# Patient Record
Sex: Male | Born: 1970 | Race: White | Hispanic: No | Marital: Single | Smoking: Current every day smoker
Health system: Southern US, Community
[De-identification: ages and names within clinical notes are randomized; demographics above are authoritative.]

## PROBLEM LIST (undated history)

## (undated) DIAGNOSIS — F17200 Nicotine dependence, unspecified, uncomplicated: Secondary | ICD-10-CM

## (undated) DIAGNOSIS — J449 Chronic obstructive pulmonary disease, unspecified: Secondary | ICD-10-CM

## (undated) DIAGNOSIS — L409 Psoriasis, unspecified: Secondary | ICD-10-CM

---

## 2019-07-01 ENCOUNTER — Inpatient Hospital Stay (HOSPITAL_COMMUNITY): Payer: Medicaid Other

## 2019-07-01 ENCOUNTER — Telehealth (HOSPITAL_COMMUNITY): Payer: Self-pay | Admitting: *Deleted

## 2019-07-01 ENCOUNTER — Inpatient Hospital Stay (HOSPITAL_COMMUNITY): Payer: Medicaid Other | Admitting: Anesthesiology

## 2019-07-01 ENCOUNTER — Emergency Department (HOSPITAL_COMMUNITY): Payer: Medicaid Other

## 2019-07-01 ENCOUNTER — Other Ambulatory Visit (HOSPITAL_COMMUNITY): Payer: Self-pay

## 2019-07-01 ENCOUNTER — Encounter (HOSPITAL_COMMUNITY): Payer: Self-pay | Admitting: Certified Registered Nurse Anesthetist

## 2019-07-01 ENCOUNTER — Emergency Department (HOSPITAL_COMMUNITY): Payer: Medicaid Other | Admitting: Certified Registered Nurse Anesthetist

## 2019-07-01 ENCOUNTER — Encounter (HOSPITAL_COMMUNITY)
Admission: EM | Disposition: A | Discharge status: Hospice | Payer: Self-pay | Source: Home / Self Care | Attending: Neurology

## 2019-07-01 ENCOUNTER — Inpatient Hospital Stay (HOSPITAL_COMMUNITY)
Admission: EM | Disposition: A | Discharge status: Hospice | Payer: Self-pay | Source: Home / Self Care | Attending: Neurology

## 2019-07-01 ENCOUNTER — Inpatient Hospital Stay (HOSPITAL_COMMUNITY)
Admission: EM | Admit: 2019-07-01 | Discharge: 2019-08-31 | DRG: 003 | Disposition: A | Discharge status: Hospice | Payer: Medicaid Other | Attending: Neurology | Admitting: Neurology

## 2019-07-01 DIAGNOSIS — J0410 Acute tracheitis without obstruction: Secondary | ICD-10-CM | POA: Diagnosis not present

## 2019-07-01 DIAGNOSIS — J9602 Acute respiratory failure with hypercapnia: Secondary | ICD-10-CM | POA: Diagnosis not present

## 2019-07-01 DIAGNOSIS — Z20822 Contact with and (suspected) exposure to covid-19: Secondary | ICD-10-CM | POA: Diagnosis present

## 2019-07-01 DIAGNOSIS — J9600 Acute respiratory failure, unspecified whether with hypoxia or hypercapnia: Secondary | ICD-10-CM | POA: Diagnosis present

## 2019-07-01 DIAGNOSIS — N179 Acute kidney failure, unspecified: Secondary | ICD-10-CM | POA: Diagnosis not present

## 2019-07-01 DIAGNOSIS — R140 Abdominal distension (gaseous): Secondary | ICD-10-CM

## 2019-07-01 DIAGNOSIS — I82403 Acute embolism and thrombosis of unspecified deep veins of lower extremity, bilateral: Secondary | ICD-10-CM | POA: Diagnosis not present

## 2019-07-01 DIAGNOSIS — Z1383 Encounter for screening for respiratory disorder NEC: Secondary | ICD-10-CM

## 2019-07-01 DIAGNOSIS — R739 Hyperglycemia, unspecified: Secondary | ICD-10-CM | POA: Diagnosis not present

## 2019-07-01 DIAGNOSIS — Y92239 Unspecified place in hospital as the place of occurrence of the external cause: Secondary | ICD-10-CM | POA: Diagnosis not present

## 2019-07-01 DIAGNOSIS — I609 Nontraumatic subarachnoid hemorrhage, unspecified: Secondary | ICD-10-CM | POA: Diagnosis not present

## 2019-07-01 DIAGNOSIS — I63232 Cerebral infarction due to unspecified occlusion or stenosis of left carotid arteries: Secondary | ICD-10-CM | POA: Diagnosis present

## 2019-07-01 DIAGNOSIS — Z6834 Body mass index (BMI) 34.0-34.9, adult: Secondary | ICD-10-CM

## 2019-07-01 DIAGNOSIS — Z66 Do not resuscitate: Secondary | ICD-10-CM | POA: Diagnosis not present

## 2019-07-01 DIAGNOSIS — I82413 Acute embolism and thrombosis of femoral vein, bilateral: Secondary | ICD-10-CM | POA: Diagnosis not present

## 2019-07-01 DIAGNOSIS — E669 Obesity, unspecified: Secondary | ICD-10-CM | POA: Diagnosis present

## 2019-07-01 DIAGNOSIS — L8990 Pressure ulcer of unspecified site, unspecified stage: Secondary | ICD-10-CM | POA: Insufficient documentation

## 2019-07-01 DIAGNOSIS — I82451 Acute embolism and thrombosis of right peroneal vein: Secondary | ICD-10-CM | POA: Diagnosis not present

## 2019-07-01 DIAGNOSIS — F329 Major depressive disorder, single episode, unspecified: Secondary | ICD-10-CM | POA: Diagnosis not present

## 2019-07-01 DIAGNOSIS — J189 Pneumonia, unspecified organism: Secondary | ICD-10-CM

## 2019-07-01 DIAGNOSIS — Z930 Tracheostomy status: Secondary | ICD-10-CM

## 2019-07-01 DIAGNOSIS — Z452 Encounter for adjustment and management of vascular access device: Secondary | ICD-10-CM | POA: Diagnosis not present

## 2019-07-01 DIAGNOSIS — T4275XA Adverse effect of unspecified antiepileptic and sedative-hypnotic drugs, initial encounter: Secondary | ICD-10-CM | POA: Diagnosis not present

## 2019-07-01 DIAGNOSIS — R000 Tachycardia, unspecified: Secondary | ICD-10-CM | POA: Diagnosis not present

## 2019-07-01 DIAGNOSIS — D649 Anemia, unspecified: Secondary | ICD-10-CM | POA: Diagnosis not present

## 2019-07-01 DIAGNOSIS — I63132 Cerebral infarction due to embolism of left carotid artery: Secondary | ICD-10-CM | POA: Diagnosis present

## 2019-07-01 DIAGNOSIS — R001 Bradycardia, unspecified: Secondary | ICD-10-CM | POA: Diagnosis not present

## 2019-07-01 DIAGNOSIS — I471 Supraventricular tachycardia: Secondary | ICD-10-CM | POA: Diagnosis not present

## 2019-07-01 DIAGNOSIS — G9600 Cerebrospinal fluid leak, unspecified: Secondary | ICD-10-CM | POA: Diagnosis not present

## 2019-07-01 DIAGNOSIS — Z931 Gastrostomy status: Secondary | ICD-10-CM

## 2019-07-01 DIAGNOSIS — J449 Chronic obstructive pulmonary disease, unspecified: Secondary | ICD-10-CM

## 2019-07-01 DIAGNOSIS — D72829 Elevated white blood cell count, unspecified: Secondary | ICD-10-CM | POA: Diagnosis not present

## 2019-07-01 DIAGNOSIS — E46 Unspecified protein-calorie malnutrition: Secondary | ICD-10-CM | POA: Diagnosis not present

## 2019-07-01 DIAGNOSIS — J155 Pneumonia due to Escherichia coli: Secondary | ICD-10-CM | POA: Diagnosis not present

## 2019-07-01 DIAGNOSIS — Z430 Encounter for attention to tracheostomy: Secondary | ICD-10-CM | POA: Diagnosis not present

## 2019-07-01 DIAGNOSIS — J690 Pneumonitis due to inhalation of food and vomit: Secondary | ICD-10-CM | POA: Diagnosis not present

## 2019-07-01 DIAGNOSIS — Z4659 Encounter for fitting and adjustment of other gastrointestinal appliance and device: Secondary | ICD-10-CM

## 2019-07-01 DIAGNOSIS — I63512 Cerebral infarction due to unspecified occlusion or stenosis of left middle cerebral artery: Secondary | ICD-10-CM | POA: Diagnosis present

## 2019-07-01 DIAGNOSIS — Z23 Encounter for immunization: Secondary | ICD-10-CM

## 2019-07-01 DIAGNOSIS — E872 Acidosis: Secondary | ICD-10-CM | POA: Diagnosis not present

## 2019-07-01 DIAGNOSIS — L409 Psoriasis, unspecified: Secondary | ICD-10-CM | POA: Diagnosis not present

## 2019-07-01 DIAGNOSIS — Z8673 Personal history of transient ischemic attack (TIA), and cerebral infarction without residual deficits: Secondary | ICD-10-CM

## 2019-07-01 DIAGNOSIS — Z791 Long term (current) use of non-steroidal anti-inflammatories (NSAID): Secondary | ICD-10-CM

## 2019-07-01 DIAGNOSIS — I69391 Dysphagia following cerebral infarction: Secondary | ICD-10-CM | POA: Diagnosis not present

## 2019-07-01 DIAGNOSIS — R509 Fever, unspecified: Secondary | ICD-10-CM | POA: Diagnosis not present

## 2019-07-01 DIAGNOSIS — Z978 Presence of other specified devices: Secondary | ICD-10-CM

## 2019-07-01 DIAGNOSIS — I6602 Occlusion and stenosis of left middle cerebral artery: Secondary | ICD-10-CM | POA: Diagnosis not present

## 2019-07-01 DIAGNOSIS — I6389 Other cerebral infarction: Secondary | ICD-10-CM | POA: Diagnosis not present

## 2019-07-01 DIAGNOSIS — R4020 Unspecified coma: Secondary | ICD-10-CM | POA: Diagnosis not present

## 2019-07-01 DIAGNOSIS — G936 Cerebral edema: Secondary | ICD-10-CM | POA: Diagnosis not present

## 2019-07-01 DIAGNOSIS — I10 Essential (primary) hypertension: Secondary | ICD-10-CM | POA: Diagnosis present

## 2019-07-01 DIAGNOSIS — Z781 Physical restraint status: Secondary | ICD-10-CM

## 2019-07-01 DIAGNOSIS — A419 Sepsis, unspecified organism: Secondary | ICD-10-CM | POA: Diagnosis not present

## 2019-07-01 DIAGNOSIS — L89893 Pressure ulcer of other site, stage 3: Secondary | ICD-10-CM | POA: Diagnosis not present

## 2019-07-01 DIAGNOSIS — Z431 Encounter for attention to gastrostomy: Secondary | ICD-10-CM | POA: Diagnosis not present

## 2019-07-01 DIAGNOSIS — E876 Hypokalemia: Secondary | ICD-10-CM | POA: Diagnosis not present

## 2019-07-01 DIAGNOSIS — K567 Ileus, unspecified: Secondary | ICD-10-CM | POA: Diagnosis not present

## 2019-07-01 DIAGNOSIS — G92 Toxic encephalopathy: Secondary | ICD-10-CM | POA: Diagnosis not present

## 2019-07-01 DIAGNOSIS — J441 Chronic obstructive pulmonary disease with (acute) exacerbation: Secondary | ICD-10-CM | POA: Diagnosis not present

## 2019-07-01 DIAGNOSIS — G8191 Hemiplegia, unspecified affecting right dominant side: Secondary | ICD-10-CM | POA: Diagnosis present

## 2019-07-01 DIAGNOSIS — J440 Chronic obstructive pulmonary disease with acute lower respiratory infection: Secondary | ICD-10-CM | POA: Diagnosis not present

## 2019-07-01 DIAGNOSIS — S0001XA Abrasion of scalp, initial encounter: Secondary | ICD-10-CM | POA: Diagnosis not present

## 2019-07-01 DIAGNOSIS — I82431 Acute embolism and thrombosis of right popliteal vein: Secondary | ICD-10-CM | POA: Diagnosis not present

## 2019-07-01 DIAGNOSIS — Z1389 Encounter for screening for other disorder: Secondary | ICD-10-CM

## 2019-07-01 DIAGNOSIS — T8131XA Disruption of external operation (surgical) wound, not elsewhere classified, initial encounter: Secondary | ICD-10-CM | POA: Diagnosis not present

## 2019-07-01 DIAGNOSIS — F17200 Nicotine dependence, unspecified, uncomplicated: Secondary | ICD-10-CM | POA: Diagnosis not present

## 2019-07-01 DIAGNOSIS — F17210 Nicotine dependence, cigarettes, uncomplicated: Secondary | ICD-10-CM | POA: Diagnosis present

## 2019-07-01 DIAGNOSIS — R29810 Facial weakness: Secondary | ICD-10-CM | POA: Diagnosis present

## 2019-07-01 DIAGNOSIS — Z9889 Other specified postprocedural states: Secondary | ICD-10-CM | POA: Diagnosis not present

## 2019-07-01 DIAGNOSIS — K760 Fatty (change of) liver, not elsewhere classified: Secondary | ICD-10-CM | POA: Diagnosis present

## 2019-07-01 DIAGNOSIS — D696 Thrombocytopenia, unspecified: Secondary | ICD-10-CM | POA: Diagnosis not present

## 2019-07-01 DIAGNOSIS — R29721 NIHSS score 21: Secondary | ICD-10-CM | POA: Diagnosis present

## 2019-07-01 DIAGNOSIS — R4701 Aphasia: Secondary | ICD-10-CM | POA: Diagnosis present

## 2019-07-01 DIAGNOSIS — J90 Pleural effusion, not elsewhere classified: Secondary | ICD-10-CM | POA: Diagnosis not present

## 2019-07-01 DIAGNOSIS — A498 Other bacterial infections of unspecified site: Secondary | ICD-10-CM | POA: Diagnosis not present

## 2019-07-01 DIAGNOSIS — Z79899 Other long term (current) drug therapy: Secondary | ICD-10-CM

## 2019-07-01 DIAGNOSIS — J9690 Respiratory failure, unspecified, unspecified whether with hypoxia or hypercapnia: Secondary | ICD-10-CM

## 2019-07-01 DIAGNOSIS — E871 Hypo-osmolality and hyponatremia: Secondary | ICD-10-CM | POA: Diagnosis not present

## 2019-07-01 DIAGNOSIS — Z7189 Other specified counseling: Secondary | ICD-10-CM

## 2019-07-01 DIAGNOSIS — A413 Sepsis due to Hemophilus influenzae: Secondary | ICD-10-CM | POA: Diagnosis not present

## 2019-07-01 DIAGNOSIS — R1312 Dysphagia, oropharyngeal phase: Secondary | ICD-10-CM | POA: Diagnosis not present

## 2019-07-01 DIAGNOSIS — J9601 Acute respiratory failure with hypoxia: Secondary | ICD-10-CM | POA: Diagnosis not present

## 2019-07-01 DIAGNOSIS — Y95 Nosocomial condition: Secondary | ICD-10-CM | POA: Diagnosis not present

## 2019-07-01 DIAGNOSIS — I639 Cerebral infarction, unspecified: Secondary | ICD-10-CM | POA: Diagnosis present

## 2019-07-01 DIAGNOSIS — O2230 Deep phlebothrombosis in pregnancy, unspecified trimester: Secondary | ICD-10-CM

## 2019-07-01 DIAGNOSIS — Z9289 Personal history of other medical treatment: Secondary | ICD-10-CM

## 2019-07-01 DIAGNOSIS — E870 Hyperosmolality and hypernatremia: Secondary | ICD-10-CM | POA: Diagnosis not present

## 2019-07-01 DIAGNOSIS — Z515 Encounter for palliative care: Secondary | ICD-10-CM | POA: Diagnosis not present

## 2019-07-01 DIAGNOSIS — R5081 Fever presenting with conditions classified elsewhere: Secondary | ICD-10-CM | POA: Diagnosis not present

## 2019-07-01 DIAGNOSIS — I82441 Acute embolism and thrombosis of right tibial vein: Secondary | ICD-10-CM | POA: Diagnosis not present

## 2019-07-01 DIAGNOSIS — R1310 Dysphagia, unspecified: Secondary | ICD-10-CM | POA: Diagnosis present

## 2019-07-01 DIAGNOSIS — B963 Hemophilus influenzae [H. influenzae] as the cause of diseases classified elsewhere: Secondary | ICD-10-CM | POA: Diagnosis present

## 2019-07-01 DIAGNOSIS — E785 Hyperlipidemia, unspecified: Secondary | ICD-10-CM | POA: Diagnosis present

## 2019-07-01 DIAGNOSIS — R0902 Hypoxemia: Secondary | ICD-10-CM

## 2019-07-01 DIAGNOSIS — L89151 Pressure ulcer of sacral region, stage 1: Secondary | ICD-10-CM | POA: Diagnosis not present

## 2019-07-01 DIAGNOSIS — R7401 Elevation of levels of liver transaminase levels: Secondary | ICD-10-CM | POA: Diagnosis not present

## 2019-07-01 DIAGNOSIS — R6520 Severe sepsis without septic shock: Secondary | ICD-10-CM | POA: Diagnosis not present

## 2019-07-01 DIAGNOSIS — B9561 Methicillin susceptible Staphylococcus aureus infection as the cause of diseases classified elsewhere: Secondary | ICD-10-CM | POA: Diagnosis present

## 2019-07-01 HISTORY — PX: IR CT HEAD LTD: IMG2386

## 2019-07-01 HISTORY — DX: Personal history of transient ischemic attack (TIA), and cerebral infarction without residual deficits: Z86.73

## 2019-07-01 HISTORY — PX: RADIOLOGY WITH ANESTHESIA: SHX6223

## 2019-07-01 HISTORY — PX: CRANIOTOMY: SHX93

## 2019-07-01 HISTORY — DX: Psoriasis, unspecified: L40.9

## 2019-07-01 HISTORY — DX: Chronic obstructive pulmonary disease, unspecified: J44.9

## 2019-07-01 HISTORY — DX: Nicotine dependence, unspecified, uncomplicated: F17.200

## 2019-07-01 HISTORY — PX: IR PERCUTANEOUS ART THROMBECTOMY/INFUSION INTRACRANIAL INC DIAG ANGIO: IMG6087

## 2019-07-01 LAB — POCT I-STAT 7, (LYTES, BLD GAS, ICA,H+H)
Acid-base deficit: 3 mmol/L — ABNORMAL HIGH (ref 0.0–2.0)
Acid-base deficit: 7 mmol/L — ABNORMAL HIGH (ref 0.0–2.0)
Acid-base deficit: 8 mmol/L — ABNORMAL HIGH (ref 0.0–2.0)
Acid-base deficit: 7 mmol/L — ABNORMAL HIGH (ref 0.0–2.0)
Acid-base deficit: 3 mmol/L — ABNORMAL HIGH (ref 0.0–2.0)
Bicarbonate: 25.3 mmol/L (ref 20.0–28.0)
Bicarbonate: 20.6 mmol/L (ref 20.0–28.0)
Bicarbonate: 19.8 mmol/L — ABNORMAL LOW (ref 20.0–28.0)
Bicarbonate: 20.2 mmol/L (ref 20.0–28.0)
Bicarbonate: 23.9 mmol/L (ref 20.0–28.0)
Calcium, Ion: 1.24 mmol/L (ref 1.15–1.40)
Calcium, Ion: 1.37 mmol/L (ref 1.15–1.40)
Calcium, Ion: 0.7 mmol/L — CL (ref 1.15–1.40)
Calcium, Ion: 0.7 mmol/L — CL (ref 1.15–1.40)
Calcium, Ion: 1.26 mmol/L (ref 1.15–1.40)
HCT: 43 % (ref 39.0–52.0)
HCT: 30 % — ABNORMAL LOW (ref 39.0–52.0)
HCT: 31 % — ABNORMAL LOW (ref 39.0–52.0)
HCT: 29 % — ABNORMAL LOW (ref 39.0–52.0)
HCT: 41 % (ref 39.0–52.0)
Hemoglobin: 14.6 g/dL (ref 13.0–17.0)
Hemoglobin: 10.2 g/dL — ABNORMAL LOW (ref 13.0–17.0)
Hemoglobin: 10.5 g/dL — ABNORMAL LOW (ref 13.0–17.0)
Hemoglobin: 9.9 g/dL — ABNORMAL LOW (ref 13.0–17.0)
Hemoglobin: 13.9 g/dL (ref 13.0–17.0)
O2 Saturation: 98 %
O2 Saturation: 91 %
O2 Saturation: 98 %
O2 Saturation: 99 %
O2 Saturation: 100 %
Patient temperature: 35.6
Patient temperature: 36
Patient temperature: 35.6
Patient temperature: 36
Potassium: 4.9 mmol/L (ref 3.5–5.1)
Potassium: 3.5 mmol/L (ref 3.5–5.1)
Potassium: 5 mmol/L (ref 3.5–5.1)
Potassium: 6.7 mmol/L (ref 3.5–5.1)
Potassium: 3.6 mmol/L (ref 3.5–5.1)
Sodium: 140 mmol/L (ref 135–145)
Sodium: 142 mmol/L (ref 135–145)
Sodium: 142 mmol/L (ref 135–145)
Sodium: 140 mmol/L (ref 135–145)
Sodium: 143 mmol/L (ref 135–145)
TCO2: 27 mmol/L (ref 22–32)
TCO2: 22 mmol/L (ref 22–32)
TCO2: 21 mmol/L — ABNORMAL LOW (ref 22–32)
TCO2: 22 mmol/L (ref 22–32)
TCO2: 25 mmol/L (ref 22–32)
pCO2 arterial: 53 mmHg — ABNORMAL HIGH (ref 32.0–48.0)
pCO2 arterial: 50.4 mmHg — ABNORMAL HIGH (ref 32.0–48.0)
pCO2 arterial: 49.5 mmHg — ABNORMAL HIGH (ref 32.0–48.0)
pCO2 arterial: 43 mmHg (ref 32.0–48.0)
pCO2 arterial: 47.6 mmHg (ref 32.0–48.0)
pH, Arterial: 7.279 — ABNORMAL LOW (ref 7.350–7.450)
pH, Arterial: 7.214 — ABNORMAL LOW (ref 7.350–7.450)
pH, Arterial: 7.203 — ABNORMAL LOW (ref 7.350–7.450)
pH, Arterial: 7.275 — ABNORMAL LOW (ref 7.350–7.450)
pH, Arterial: 7.309 — ABNORMAL LOW (ref 7.350–7.450)
pO2, Arterial: 113 mmHg — ABNORMAL HIGH (ref 83.0–108.0)
pO2, Arterial: 70 mmHg — ABNORMAL LOW (ref 83.0–108.0)
pO2, Arterial: 131 mmHg — ABNORMAL HIGH (ref 83.0–108.0)
pO2, Arterial: 183 mmHg — ABNORMAL HIGH (ref 83.0–108.0)
pO2, Arterial: 209 mmHg — ABNORMAL HIGH (ref 83.0–108.0)

## 2019-07-01 LAB — BLOOD GAS, ARTERIAL
Acid-Base Excess: 0.5 mmol/L (ref 0.0–2.0)
Bicarbonate: 26.3 mmol/L (ref 20.0–28.0)
Drawn by: 137461
FIO2: 100
O2 Saturation: 99.4 %
Patient temperature: 37
pCO2 arterial: 56.4 mmHg — ABNORMAL HIGH (ref 32.0–48.0)
pH, Arterial: 7.291 — ABNORMAL LOW (ref 7.350–7.450)
pO2, Arterial: 310 mmHg — ABNORMAL HIGH (ref 83.0–108.0)

## 2019-07-01 LAB — I-STAT CHEM 8, ED
BUN: 7 mg/dL (ref 6–20)
Calcium, Ion: 0.86 mmol/L — CL (ref 1.15–1.40)
Chloride: 105 mmol/L (ref 98–111)
Creatinine, Ser: 1 mg/dL (ref 0.61–1.24)
Glucose, Bld: 151 mg/dL — ABNORMAL HIGH (ref 70–99)
HCT: 47 % (ref 39.0–52.0)
Hemoglobin: 16 g/dL (ref 13.0–17.0)
Potassium: 3.9 mmol/L (ref 3.5–5.1)
Sodium: 139 mmol/L (ref 135–145)
TCO2: 27 mmol/L (ref 22–32)

## 2019-07-01 LAB — RAPID URINE DRUG SCREEN, HOSP PERFORMED
Amphetamines: NOT DETECTED
Barbiturates: NOT DETECTED
Benzodiazepines: POSITIVE — AB
Cocaine: NOT DETECTED
Opiates: NOT DETECTED
Tetrahydrocannabinol: NOT DETECTED

## 2019-07-01 LAB — COMPREHENSIVE METABOLIC PANEL
ALT: 27 U/L (ref 0–44)
ALT: 40 U/L (ref 0–44)
AST: 22 U/L (ref 15–41)
AST: 39 U/L (ref 15–41)
Albumin: 3.4 g/dL — ABNORMAL LOW (ref 3.5–5.0)
Albumin: 3.2 g/dL — ABNORMAL LOW (ref 3.5–5.0)
Alkaline Phosphatase: 50 U/L (ref 38–126)
Alkaline Phosphatase: 40 U/L (ref 38–126)
Anion gap: 6 (ref 5–15)
Anion gap: 10 (ref 5–15)
BUN: 7 mg/dL (ref 6–20)
BUN: 6 mg/dL (ref 6–20)
CO2: 25 mmol/L (ref 22–32)
CO2: 22 mmol/L (ref 22–32)
Calcium: 8.6 mg/dL — ABNORMAL LOW (ref 8.9–10.3)
Calcium: 8.7 mg/dL — ABNORMAL LOW (ref 8.9–10.3)
Chloride: 108 mmol/L (ref 98–111)
Chloride: 110 mmol/L (ref 98–111)
Creatinine, Ser: 1.07 mg/dL (ref 0.61–1.24)
Creatinine, Ser: 0.97 mg/dL (ref 0.61–1.24)
GFR calc Af Amer: >60 mL/min (ref >60)
GFR calc Af Amer: >60 mL/min (ref >60)
GFR calc non Af Amer: >60 mL/min (ref >60)
GFR calc non Af Amer: >60 mL/min (ref >60)
Glucose, Bld: 160 mg/dL — ABNORMAL HIGH (ref 70–99)
Glucose, Bld: 178 mg/dL — ABNORMAL HIGH (ref 70–99)
Potassium: 4.1 mmol/L (ref 3.5–5.1)
Potassium: 3.7 mmol/L (ref 3.5–5.1)
Sodium: 139 mmol/L (ref 135–145)
Sodium: 142 mmol/L (ref 135–145)
Total Bilirubin: 0.6 mg/dL (ref 0.3–1.2)
Total Bilirubin: 2.7 mg/dL — ABNORMAL HIGH (ref 0.3–1.2)
Total Protein: 5.8 g/dL — ABNORMAL LOW (ref 6.5–8.1)
Total Protein: 5.2 g/dL — ABNORMAL LOW (ref 6.5–8.1)

## 2019-07-01 LAB — PROTIME-INR
INR: 1 (ref 0.8–1.2)
INR: 1.4 — ABNORMAL HIGH (ref 0.8–1.2)
Prothrombin Time: 12.8 seconds (ref 11.4–15.2)
Prothrombin Time: 16.7 seconds — ABNORMAL HIGH (ref 11.4–15.2)

## 2019-07-01 LAB — DIFFERENTIAL
Abs Immature Granulocytes: 0.03 10*3/uL (ref 0.00–0.07)
Basophils Absolute: 0 10*3/uL (ref 0.0–0.1)
Basophils Relative: 1 %
Eosinophils Absolute: 0.1 10*3/uL (ref 0.0–0.5)
Eosinophils Relative: 2 %
Immature Granulocytes: 1 %
Lymphocytes Relative: 24 %
Lymphs Abs: 1.6 10*3/uL (ref 0.7–4.0)
Monocytes Absolute: 0.5 10*3/uL (ref 0.1–1.0)
Monocytes Relative: 8 %
Neutro Abs: 4.3 10*3/uL (ref 1.7–7.7)
Neutrophils Relative %: 64 %

## 2019-07-01 LAB — CBC
HCT: 48.3 % (ref 39.0–52.0)
HCT: 44.2 % (ref 39.0–52.0)
Hemoglobin: 15.9 g/dL (ref 13.0–17.0)
Hemoglobin: 14.9 g/dL (ref 13.0–17.0)
MCH: 30.3 pg (ref 26.0–34.0)
MCH: 29.2 pg (ref 26.0–34.0)
MCHC: 32.9 g/dL (ref 30.0–36.0)
MCHC: 33.7 g/dL (ref 30.0–36.0)
MCV: 92.2 fL (ref 80.0–100.0)
MCV: 86.7 fL (ref 80.0–100.0)
Platelets: 166 10*3/uL (ref 150–400)
Platelets: 41 10*3/uL — ABNORMAL LOW (ref 150–400)
RBC: 5.24 MIL/uL (ref 4.22–5.81)
RBC: 5.1 MIL/uL (ref 4.22–5.81)
RDW: 12.6 % (ref 11.5–15.5)
RDW: 14.3 % (ref 11.5–15.5)
WBC: 6.6 10*3/uL (ref 4.0–10.5)
WBC: 11.5 10*3/uL — ABNORMAL HIGH (ref 4.0–10.5)
nRBC: 0 % (ref 0.0–0.2)
nRBC: 0 % (ref 0.0–0.2)

## 2019-07-01 LAB — HEMOGLOBIN AND HEMATOCRIT, BLOOD
HCT: 29.6 % — ABNORMAL LOW (ref 39.0–52.0)
Hemoglobin: 10.1 g/dL — ABNORMAL LOW (ref 13.0–17.0)

## 2019-07-01 LAB — FIBRINOGEN: Fibrinogen: 190 mg/dL — ABNORMAL LOW (ref 210–475)

## 2019-07-01 LAB — PREPARE RBC (CROSSMATCH)

## 2019-07-01 LAB — ABO/RH: ABO/RH(D): B POS

## 2019-07-01 LAB — RESPIRATORY PANEL BY RT PCR (FLU A&B, COVID)
Influenza A by PCR: NEGATIVE
Influenza B by PCR: NEGATIVE
SARS Coronavirus 2 by RT PCR: NEGATIVE

## 2019-07-01 LAB — SODIUM: Sodium: 142 mmol/L (ref 135–145)

## 2019-07-01 LAB — APTT
aPTT: 26 seconds (ref 24–36)
aPTT: 49 seconds — ABNORMAL HIGH (ref 24–36)

## 2019-07-01 LAB — PLATELET COUNT: Platelets: 55 10*3/uL — ABNORMAL LOW (ref 150–400)

## 2019-07-01 LAB — CBG MONITORING, ED: Glucose-Capillary: 144 mg/dL — ABNORMAL HIGH (ref 70–99)

## 2019-07-01 LAB — GLUCOSE, CAPILLARY: Glucose-Capillary: 147 mg/dL — ABNORMAL HIGH (ref 70–99)

## 2019-07-01 LAB — HIV ANTIBODY (ROUTINE TESTING W REFLEX): HIV Screen 4th Generation wRfx: NONREACTIVE

## 2019-07-01 LAB — ETHANOL: Alcohol, Ethyl (B): <10 mg/dL (ref <10)

## 2019-07-01 LAB — MRSA PCR SCREENING: MRSA by PCR: NEGATIVE

## 2019-07-01 SURGERY — CRANIOTOMY HEMATOMA EVACUATION SUBDURAL
Anesthesia: General | Site: Head | Laterality: Left

## 2019-07-01 SURGERY — IR WITH ANESTHESIA
Anesthesia: General

## 2019-07-01 MED ORDER — STUDY - SODIUM CHLORIDE 0.9% IV SOLN: 20.0000 mL/h | INTRAVENOUS | Status: DC

## 2019-07-01 MED ORDER — PANTOPRAZOLE SODIUM 40 MG IV SOLR: 40.0000 mg | INTRAVENOUS | Status: DC

## 2019-07-01 MED ORDER — CEFAZOLIN SODIUM-DEXTROSE 2-3 GM-%(50ML) IV SOLR
INTRAVENOUS | Status: DC | PRN
Start: 2019-07-01 — End: 2019-07-01
  Administered 2019-07-01: 2 g via INTRAVENOUS

## 2019-07-01 MED ORDER — SENNOSIDES-DOCUSATE SODIUM 8.6-50 MG PO TABS: 1.0000 | ORAL_TABLET | Freq: Every evening | ORAL | Status: DC | PRN

## 2019-07-01 MED ORDER — NITROGLYCERIN 1 MG/10 ML FOR IR/CATH LAB
INTRA_ARTERIAL | Status: AC | PRN
  Administered 2019-07-01: 25 ug via INTRA_ARTERIAL

## 2019-07-01 MED ORDER — 0.9 % SODIUM CHLORIDE (POUR BTL) OPTIME
TOPICAL | Status: DC | PRN
  Administered 2019-07-01 (×4): 1000 mL

## 2019-07-01 MED ORDER — ALBUMIN HUMAN 5 % IV SOLN
INTRAVENOUS | Status: DC | PRN
Start: 2019-07-01 — End: 2019-07-01

## 2019-07-01 MED ORDER — PROPOFOL 500 MG/50ML IV EMUL
INTRAVENOUS | Status: DC | PRN
Start: 2019-07-01 — End: 2019-07-01
  Administered 2019-07-01: 75 ug/kg/min via INTRAVENOUS

## 2019-07-01 MED ORDER — ONDANSETRON HCL 4 MG/2ML IJ SOLN: 4.0000 mg | INTRAMUSCULAR | Status: DC | PRN

## 2019-07-01 MED ORDER — PHENYLEPHRINE HCL (PRESSORS) 10 MG/ML IV SOLN
INTRAVENOUS | Status: DC | PRN
Start: 2019-07-01 — End: 2019-07-01
  Administered 2019-07-01: 80 ug via INTRAVENOUS
  Administered 2019-07-01: 120 ug via INTRAVENOUS

## 2019-07-01 MED ORDER — ORAL CARE MOUTH RINSE
15.0000 mL | OROMUCOSAL | Status: DC
  Administered 2019-07-01 – 2019-07-22 (×203): 15 mL via OROMUCOSAL

## 2019-07-01 MED ORDER — FENTANYL CITRATE (PF) 100 MCG/2ML IJ SOLN: 25.0000 ug | INTRAMUSCULAR | Status: DC | PRN

## 2019-07-01 MED ORDER — CEFAZOLIN SODIUM-DEXTROSE 2-4 GM/100ML-% IV SOLN
INTRAVENOUS | Status: AC
  Filled 2019-07-01: qty 100

## 2019-07-01 MED ORDER — SODIUM CHLORIDE 0.9 % IV SOLN
INTRAVENOUS | Status: DC | PRN
Start: 2019-07-01 — End: 2019-07-01

## 2019-07-01 MED ORDER — PROPOFOL 10 MG/ML IV BOLUS
INTRAVENOUS | Status: DC | PRN
  Administered 2019-07-01: 50 mg via INTRAVENOUS

## 2019-07-01 MED ORDER — STUDY - CHARM - BOLUS VIA INFUSION OF GLIBENCLAMIDE/GLYBURIDE (BIIB093) OR PLACEBO (PI-JINDONG XU)
676.0000 mL/h | Freq: Once | INTRAVENOUS | Status: DC
  Filled 2019-07-01: qty 22.5

## 2019-07-01 MED ORDER — STUDY - CHARM - BOLUS VIA INFUSION OF GLIBENCLAMIDE/GLYBURIDE (BIIB093) OR PLACEBO (PI-JINDONG XU)
676.0000 mL/h | Freq: Once | INTRAVENOUS | Status: DC
  Filled 2019-07-01 (×2): qty 22.5

## 2019-07-01 MED ORDER — STUDY - SODIUM CHLORIDE 0.9% IV SOLN
29.0000 mL/h | INTRAVENOUS | Status: DC
  Filled 2019-07-01: qty 5.2

## 2019-07-01 MED ORDER — DEXAMETHASONE SODIUM PHOSPHATE 4 MG/ML IJ SOLN: 4.0000 mg | Freq: Four times a day (QID) | INTRAMUSCULAR | Status: DC

## 2019-07-01 MED ORDER — IOHEXOL 300 MG/ML  SOLN
150.0000 mL | Freq: Once | INTRAMUSCULAR | Status: AC | PRN
  Administered 2019-07-01: 11:00:00 75 mL via INTRA_ARTERIAL

## 2019-07-01 MED ORDER — LACTATED RINGERS IV SOLN: INTRAVENOUS | Status: DC | PRN

## 2019-07-01 MED ORDER — EPTIFIBATIDE 20 MG/10ML IV SOLN
INTRAVENOUS | Status: AC
  Filled 2019-07-01: qty 10

## 2019-07-01 MED ORDER — SODIUM CHLORIDE 3 % IV SOLN
INTRAVENOUS | Status: DC
  Administered 2019-07-01 – 2019-07-02 (×3): 75 mL/h via INTRAVENOUS
  Administered 2019-07-02 – 2019-07-03 (×3): 100 mL/h via INTRAVENOUS
  Filled 2019-07-01 (×10): qty 500

## 2019-07-01 MED ORDER — STUDY - SODIUM CHLORIDE 0.9% IV SOLN
20.0000 mL/h | INTRAVENOUS | Status: DC
  Filled 2019-07-01: qty 5.2

## 2019-07-01 MED ORDER — IOHEXOL 240 MG/ML SOLN
INTRAMUSCULAR | Status: AC
  Filled 2019-07-01: qty 200

## 2019-07-01 MED ORDER — ACETAMINOPHEN 325 MG PO TABS: 650.0000 mg | ORAL_TABLET | ORAL | Status: DC | PRN

## 2019-07-01 MED ORDER — CHLORHEXIDINE GLUCONATE CLOTH 2 % EX PADS
6.0000 | MEDICATED_PAD | Freq: Every day | CUTANEOUS | Status: DC
  Administered 2019-07-01 – 2019-08-28 (×50): 6 via TOPICAL

## 2019-07-01 MED ORDER — PROPOFOL 10 MG/ML IV BOLUS
INTRAVENOUS | Status: DC | PRN
  Administered 2019-07-01: 20 mg via INTRAVENOUS
  Administered 2019-07-01: 180 mg via INTRAVENOUS

## 2019-07-01 MED ORDER — TIROFIBAN HCL IN NACL 5-0.9 MG/100ML-% IV SOLN
INTRAVENOUS | Status: AC
  Filled 2019-07-01: qty 100

## 2019-07-01 MED ORDER — EPINEPHRINE 1 MG/10ML IJ SOSY
PREFILLED_SYRINGE | INTRAMUSCULAR | Status: DC | PRN
Start: 2019-07-01 — End: 2019-07-01
  Administered 2019-07-01: .1 mg via INTRAVENOUS
  Administered 2019-07-01: .2 mg via INTRAVENOUS
  Administered 2019-07-01: .1 mg via INTRAVENOUS

## 2019-07-01 MED ORDER — STUDY - SODIUM CHLORIDE 0.9% IV SOLN
29.0000 mL/h | INTRAVENOUS | Status: DC
  Filled 2019-07-01 (×3): qty 5.2

## 2019-07-01 MED ORDER — PROMETHAZINE HCL 25 MG PO TABS: 12.5000 mg | ORAL_TABLET | ORAL | Status: DC | PRN

## 2019-07-01 MED ORDER — LABETALOL HCL 5 MG/ML IV SOLN
10.0000 mg | INTRAVENOUS | Status: DC | PRN
  Administered 2019-07-02: 20 mg via INTRAVENOUS
  Filled 2019-07-01 (×2): qty 4

## 2019-07-01 MED ORDER — STROKE: EARLY STAGES OF RECOVERY BOOK
Freq: Once | Status: DC
  Filled 2019-07-01: qty 1

## 2019-07-01 MED ORDER — FENTANYL CITRATE (PF) 250 MCG/5ML IJ SOLN
INTRAMUSCULAR | Status: AC
  Filled 2019-07-01: qty 5

## 2019-07-01 MED ORDER — MORPHINE SULFATE (PF) 2 MG/ML IV SOLN
1.0000 mg | INTRAVENOUS | Status: DC | PRN
  Administered 2019-07-02 – 2019-07-13 (×2): 2 mg via INTRAVENOUS
  Filled 2019-07-01 (×2): qty 1

## 2019-07-01 MED ORDER — DEXAMETHASONE SODIUM PHOSPHATE 10 MG/ML IJ SOLN
6.0000 mg | Freq: Four times a day (QID) | INTRAMUSCULAR | Status: DC
  Administered 2019-07-01 – 2019-07-02 (×2): 6 mg via INTRAVENOUS
  Filled 2019-07-01: qty 1

## 2019-07-01 MED ORDER — VASOPRESSIN 20 UNIT/ML IV SOLN
INTRAVENOUS | Status: DC | PRN
Start: 2019-07-01 — End: 2019-07-01
  Administered 2019-07-01 (×3): 5 [IU] via INTRAVENOUS
  Administered 2019-07-01: 2 [IU] via INTRAVENOUS
  Administered 2019-07-01: 5 [IU] via INTRAVENOUS

## 2019-07-01 MED ORDER — NITROGLYCERIN 1 MG/10 ML FOR IR/CATH LAB
INTRA_ARTERIAL | Status: AC
  Filled 2019-07-01: qty 10

## 2019-07-01 MED ORDER — DEXAMETHASONE 4 MG PO TABS: 4.0000 mg | ORAL_TABLET | Freq: Four times a day (QID) | ORAL | Status: DC

## 2019-07-01 MED ORDER — BACITRACIN ZINC 500 UNIT/GM EX OINT
TOPICAL_OINTMENT | CUTANEOUS | Status: DC | PRN
  Administered 2019-07-01: 1 via TOPICAL

## 2019-07-01 MED ORDER — DEXAMETHASONE 4 MG PO TABS: 6.0000 mg | ORAL_TABLET | Freq: Four times a day (QID) | ORAL | Status: DC

## 2019-07-01 MED ORDER — LEVETIRACETAM IN NACL 500 MG/100ML IV SOLN
500.0000 mg | Freq: Two times a day (BID) | INTRAVENOUS | Status: DC
  Administered 2019-07-01 – 2019-07-03 (×5): 500 mg via INTRAVENOUS
  Filled 2019-07-01 (×5): qty 100

## 2019-07-01 MED ORDER — FENTANYL CITRATE (PF) 100 MCG/2ML IJ SOLN
25.0000 ug | INTRAMUSCULAR | Status: DC | PRN
  Administered 2019-07-01 – 2019-07-02 (×5): 100 ug via INTRAVENOUS
  Filled 2019-07-01 (×5): qty 2

## 2019-07-01 MED ORDER — ACETAMINOPHEN 160 MG/5ML PO SOLN
650.0000 mg | ORAL | Status: DC | PRN
  Administered 2019-07-02 – 2019-07-06 (×7): 650 mg
  Filled 2019-07-01 (×9): qty 20.3

## 2019-07-01 MED ORDER — PHENYLEPHRINE HCL-NACL 10-0.9 MG/250ML-% IV SOLN
INTRAVENOUS | Status: DC | PRN
Start: 2019-07-01 — End: 2019-07-01
  Administered 2019-07-01: 50 ug/min via INTRAVENOUS

## 2019-07-01 MED ORDER — IOHEXOL 300 MG/ML  SOLN
50.0000 mL | Freq: Once | INTRAMUSCULAR | Status: AC | PRN
  Administered 2019-07-01: 5 mL via INTRA_ARTERIAL

## 2019-07-01 MED ORDER — IOHEXOL 350 MG/ML SOLN
100.0000 mL | Freq: Once | INTRAVENOUS | Status: AC | PRN
  Administered 2019-07-01: 08:00:00 100 mL via INTRAVENOUS

## 2019-07-01 MED ORDER — CLEVIDIPINE BUTYRATE 0.5 MG/ML IV EMUL
0.0000 mg/h | INTRAVENOUS | Status: DC
  Administered 2019-07-01: 20:00:00 2 mg/h via INTRAVENOUS
  Administered 2019-07-02: 09:00:00 1 mg/h via INTRAVENOUS
  Administered 2019-07-02: 21 mg/h via INTRAVENOUS
  Filled 2019-07-01 (×3): qty 50

## 2019-07-01 MED ORDER — THROMBIN 20000 UNITS EX SOLR
CUTANEOUS | Status: AC
  Filled 2019-07-01: qty 20000

## 2019-07-01 MED ORDER — ACETAMINOPHEN 160 MG/5ML PO SOLN: 650.0000 mg | ORAL | Status: DC | PRN

## 2019-07-01 MED ORDER — THROMBIN 5000 UNITS EX SOLR
CUTANEOUS | Status: AC
  Filled 2019-07-01: qty 5000

## 2019-07-01 MED ORDER — ASPIRIN 81 MG PO CHEW
CHEWABLE_TABLET | ORAL | Status: AC
  Filled 2019-07-01: qty 1

## 2019-07-01 MED ORDER — ACETAMINOPHEN 650 MG RE SUPP: 650.0000 mg | RECTAL | Status: DC | PRN

## 2019-07-01 MED ORDER — ONDANSETRON HCL 4 MG PO TABS: 4.0000 mg | ORAL_TABLET | ORAL | Status: DC | PRN

## 2019-07-01 MED ORDER — PHENYLEPHRINE HCL-NACL 10-0.9 MG/250ML-% IV SOLN
0.0000 ug/min | INTRAVENOUS | Status: DC
  Administered 2019-07-02: 17:00:00 20 ug/min via INTRAVENOUS
  Filled 2019-07-01 (×2): qty 250

## 2019-07-01 MED ORDER — HEMOSTATIC AGENTS (NO CHARGE) OPTIME
TOPICAL | Status: DC | PRN
  Administered 2019-07-01: 1 via TOPICAL

## 2019-07-01 MED ORDER — SODIUM CHLORIDE 0.9 % IV SOLN: INTRAVENOUS | Status: DC

## 2019-07-01 MED ORDER — LIDOCAINE-EPINEPHRINE 0.5 %-1:200000 IJ SOLN
INTRAMUSCULAR | Status: AC
  Filled 2019-07-01: qty 1

## 2019-07-01 MED ORDER — ROCURONIUM BROMIDE 50 MG/5ML IV SOSY
PREFILLED_SYRINGE | INTRAVENOUS | Status: DC | PRN
  Administered 2019-07-01 (×2): 100 mg via INTRAVENOUS

## 2019-07-01 MED ORDER — LIDOCAINE-EPINEPHRINE 0.5 %-1:200000 IJ SOLN
INTRAMUSCULAR | Status: DC | PRN
  Administered 2019-07-01: 10 mL

## 2019-07-01 MED ORDER — THROMBIN 5000 UNITS EX SOLR
CUTANEOUS | Status: AC
  Filled 2019-07-01: qty 10000

## 2019-07-01 MED ORDER — THROMBIN 20000 UNITS EX SOLR: CUTANEOUS | Status: DC | PRN

## 2019-07-01 MED ORDER — IOHEXOL 300 MG/ML  SOLN
150.0000 mL | Freq: Once | INTRAMUSCULAR | Status: AC | PRN
  Administered 2019-07-01: 25 mL via INTRA_ARTERIAL

## 2019-07-01 MED ORDER — CHLORHEXIDINE GLUCONATE 0.12% ORAL RINSE (MEDLINE KIT)
15.0000 mL | Freq: Two times a day (BID) | OROMUCOSAL | Status: DC
  Administered 2019-07-01 – 2019-08-31 (×122): 15 mL via OROMUCOSAL

## 2019-07-01 MED ORDER — PANTOPRAZOLE SODIUM 40 MG IV SOLR
40.0000 mg | Freq: Every day | INTRAVENOUS | Status: DC
  Administered 2019-07-01: 22:00:00 40 mg via INTRAVENOUS
  Filled 2019-07-01: qty 40

## 2019-07-01 MED ORDER — CLOPIDOGREL BISULFATE 300 MG PO TABS
ORAL_TABLET | ORAL | Status: AC
  Filled 2019-07-01: qty 1

## 2019-07-01 MED ORDER — MIDAZOLAM HCL 2 MG/2ML IJ SOLN
INTRAMUSCULAR | Status: DC | PRN
  Administered 2019-07-01: 2 mg via INTRAVENOUS

## 2019-07-01 MED ORDER — DEXAMETHASONE 4 MG PO TABS: 4.0000 mg | ORAL_TABLET | Freq: Three times a day (TID) | ORAL | Status: DC

## 2019-07-01 MED ORDER — MIDAZOLAM HCL 2 MG/2ML IJ SOLN
INTRAMUSCULAR | Status: AC
  Filled 2019-07-01: qty 2

## 2019-07-01 MED ORDER — ROCURONIUM BROMIDE 10 MG/ML (PF) SYRINGE
PREFILLED_SYRINGE | INTRAVENOUS | Status: DC | PRN
  Administered 2019-07-01: 70 mg via INTRAVENOUS
  Administered 2019-07-01: 30 mg via INTRAVENOUS
  Administered 2019-07-01 (×2): 20 mg via INTRAVENOUS
  Administered 2019-07-01: 30 mg via INTRAVENOUS

## 2019-07-01 MED ORDER — SODIUM CHLORIDE 0.9 % IV SOLN: INTRAVENOUS | Status: DC | PRN

## 2019-07-01 MED ORDER — THROMBIN 5000 UNITS EX SOLR: OROMUCOSAL | Status: DC | PRN

## 2019-07-01 MED ORDER — FENTANYL CITRATE (PF) 250 MCG/5ML IJ SOLN
INTRAMUSCULAR | Status: DC | PRN
  Administered 2019-07-01 (×2): 100 ug via INTRAVENOUS

## 2019-07-01 MED ORDER — BACITRACIN ZINC 500 UNIT/GM EX OINT
TOPICAL_OINTMENT | CUTANEOUS | Status: AC
  Filled 2019-07-01: qty 28.35

## 2019-07-01 MED ORDER — VERAPAMIL HCL 2.5 MG/ML IV SOLN
INTRAVENOUS | Status: AC
  Filled 2019-07-01: qty 2

## 2019-07-01 MED ORDER — TICAGRELOR 90 MG PO TABS
ORAL_TABLET | ORAL | Status: AC
  Filled 2019-07-01: qty 2

## 2019-07-01 MED ORDER — DEXAMETHASONE SODIUM PHOSPHATE 10 MG/ML IJ SOLN
INTRAMUSCULAR | Status: AC
  Filled 2019-07-01: qty 1

## 2019-07-01 MED ORDER — FENTANYL CITRATE (PF) 100 MCG/2ML IJ SOLN
INTRAMUSCULAR | Status: DC | PRN
  Administered 2019-07-01: 100 ug via INTRAVENOUS
  Administered 2019-07-01 (×2): 50 ug via INTRAVENOUS

## 2019-07-01 MED ORDER — POTASSIUM CHLORIDE IN NACL 20-0.9 MEQ/L-% IV SOLN
INTRAVENOUS | Status: DC
  Filled 2019-07-01: qty 1000

## 2019-07-01 MED ORDER — DEXAMETHASONE SODIUM PHOSPHATE 4 MG/ML IJ SOLN: 4.0000 mg | Freq: Three times a day (TID) | INTRAMUSCULAR | Status: DC

## 2019-07-01 MED ORDER — CALCIUM CHLORIDE 10 % IV SOLN
INTRAVENOUS | Status: DC | PRN
Start: 2019-07-01 — End: 2019-07-01
  Administered 2019-07-01 (×3): 1 g via INTRAVENOUS

## 2019-07-01 MED ORDER — NALOXONE HCL 0.4 MG/ML IJ SOLN: 0.0800 mg | INTRAMUSCULAR | Status: DC | PRN

## 2019-07-01 MED ORDER — SUCCINYLCHOLINE CHLORIDE 20 MG/ML IJ SOLN
INTRAMUSCULAR | Status: DC | PRN
Start: 2019-07-01 — End: 2019-07-01
  Administered 2019-07-01: 200 mg via INTRAVENOUS

## 2019-07-01 SURGICAL SUPPLY — 51 items
BLADE CLIPPER SURG (BLADE) ×3 IMPLANT
BLADE ULTRA TIP 2M (BLADE) ×1 IMPLANT
BNDG CMPR 75X41 PLY ABS (GAUZE/BANDAGES/DRESSINGS) ×1
BNDG GAUZE ELAST 4 BULKY (GAUZE/BANDAGES/DRESSINGS) ×4 IMPLANT
BNDG STRETCH 4X75 NS LF (GAUZE/BANDAGES/DRESSINGS) ×3 IMPLANT
BUR ACORN 6.0 PRECISION (BURR) ×2 IMPLANT
BUR ACORN 6.0MM PRECISION (BURR) ×1
BUR SPIRAL ROUTER 2.3 (BUR) ×1 IMPLANT
BUR SPIRAL ROUTER 2.3MM (BUR) ×1
CANISTER SUCT 3000ML PPV (MISCELLANEOUS) ×13 IMPLANT
CARTRIDGE OIL MAESTRO DRILL (MISCELLANEOUS) ×1 IMPLANT
CLIP RANEY DISP (INSTRUMENTS) ×4 IMPLANT
DIFFUSER DRILL AIR PNEUMATIC (MISCELLANEOUS) ×3 IMPLANT
DRAPE NEUROLOGICAL W/INCISE (DRAPES) ×3 IMPLANT
DRAPE WARM FLUID 44X44 (DRAPES) ×3 IMPLANT
DRSG PAD ABDOMINAL 8X10 ST (GAUZE/BANDAGES/DRESSINGS) ×2 IMPLANT
DURAPREP 26ML APPLICATOR (WOUND CARE) ×2 IMPLANT
ELECT REM PT RETURN 9FT ADLT (ELECTROSURGICAL) ×3
ELECTRODE REM PT RTRN 9FT ADLT (ELECTROSURGICAL) ×1 IMPLANT
FORCEPS BIPOLAR SPETZLER 8 1.0 (NEUROSURGERY SUPPLIES) ×3 IMPLANT
GAUZE SPONGE 4X4 12PLY STRL (GAUZE/BANDAGES/DRESSINGS) ×3 IMPLANT
GLOVE BIOGEL PI IND STRL 7.0 (GLOVE) ×3 IMPLANT
GLOVE BIOGEL PI IND STRL 7.5 (GLOVE) IMPLANT
GLOVE BIOGEL PI INDICATOR 7.0 (GLOVE) ×6
GLOVE BIOGEL PI INDICATOR 7.5 (GLOVE) ×6
GLOVE ECLIPSE 6.5 STRL STRAW (GLOVE) ×3 IMPLANT
GOWN STRL REUS W/ TWL LRG LVL3 (GOWN DISPOSABLE) ×2 IMPLANT
GOWN STRL REUS W/ TWL XL LVL3 (GOWN DISPOSABLE) IMPLANT
GOWN STRL REUS W/TWL LRG LVL3 (GOWN DISPOSABLE) ×6
GOWN STRL REUS W/TWL XL LVL3 (GOWN DISPOSABLE) ×3
GRAFT DURAGEN MATRIX 5WX7L (Graft) IMPLANT
HEMOSTAT POWDER KIT SURGIFOAM (HEMOSTASIS) ×12 IMPLANT
HEMOSTAT SURGICEL 2X14 (HEMOSTASIS) ×2 IMPLANT
KIT BASIN OR (CUSTOM PROCEDURE TRAY) ×3 IMPLANT
KIT TURNOVER KIT B (KITS) ×3 IMPLANT
NDL HYPO 25X1 1.5 SAFETY (NEEDLE) ×1 IMPLANT
NEEDLE HYPO 25X1 1.5 SAFETY (NEEDLE) ×3 IMPLANT
NS IRRIG 1000ML POUR BTL (IV SOLUTION) ×11 IMPLANT
OIL CARTRIDGE MAESTRO DRILL (MISCELLANEOUS) ×3
PACK CRANIOTOMY CUSTOM (CUSTOM PROCEDURE TRAY) ×3 IMPLANT
SPONGE SURGIFOAM ABS GEL 100 (HEMOSTASIS) ×9 IMPLANT
STAPLER VISISTAT 35W (STAPLE) ×3 IMPLANT
STOCKINETTE TUBULAR 6 INCH (GAUZE/BANDAGES/DRESSINGS) ×3 IMPLANT
SUT ETHIBOND NAB CTX #1 30IN (SUTURE) ×4 IMPLANT
SUT NURALON 4 0 TR CR/8 (SUTURE) ×6 IMPLANT
SUT VIC AB 2-0 CT2 18 VCP726D (SUTURE) ×2 IMPLANT
TOWEL GREEN STERILE (TOWEL DISPOSABLE) ×3 IMPLANT
TOWEL GREEN STERILE FF (TOWEL DISPOSABLE) ×3 IMPLANT
TRAY FOLEY MTR SLVR 16FR STAT (SET/KITS/TRAYS/PACK) ×3 IMPLANT
UNDERPAD 30X30 (UNDERPADS AND DIAPERS) ×3 IMPLANT
WATER STERILE IRR 1000ML POUR (IV SOLUTION) ×3 IMPLANT

## 2019-07-01 NOTE — Op Note (Signed)
07/01/2019  8:09 PM  PATIENT:  Lee Hooper  49 y.o. 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 He is intubated and I was contacted after his vessel occluded after being opened. His infarct completed and there were no other options other than a hemicraniectomy.He was taken emergently to the OR. PRE-OPERATIVE DIAGNOSIS:  Left MCA infarct  POST-OPERATIVE DIAGNOSIS:  Left MCA infarct  PROCEDURE:  Procedure(s): Left Hemicraniectomy  SURGEON: Surgeon(s): Coletta Memos, MD Donalee Citrin, MD  ASSISTANTS:Cram, Jillyn Hidden  ANESTHESIA:   general  EBL:  No intake/output data recorded.  BLOOD ADMINISTERED:11 CC PRBC and 9 FFP  CELL SAVER GIVEN:no  COUNT:per nursing  DRAINS: none   SPECIMEN:  No Specimen  DICTATION: TREVYN LUMPKIN was taken to the operating room having been intubated, and was placed under a general anesthetic without difficulty. He was positioned supine. His head was shaved and  was prepped and draped in a sterile manner. Once adequate anesthesia was obtained I placed his head in a [place] head holder at 60 pounds of pressure.  I made a reverse question mark incision on the left side to expose the frontal ,temporal and parietal regions. The scalp flap was developed with the temporalis muscle and reflected rostrally exposing the skull. I divided the temporalis muscle with monopolar cautery.  I drilled multiple  Burr holes around the perimeter of the exposed skull. I used a craniotome to turn the flap. The dura was quite adherent to the skull. Once the bone flap was elevated I encountered heavy bleeding medially frontally. I did not expose the SSS, nor was the flap elevated at the sinus. I alternated between packing the venous bleeding, and trying to identify the source of the bleeding. While I could at times see where the bleeding originated, I was never able to coagulate or pack such that when I removed pressure on the site the bleeding would stop. During this time  the left hemisphere continued to swell. I removed some of the frontal lobe to better expose the bleeding. We eventually used some of the muscle attached to the skull flap and surgifoam to form a plug and it did contain the bleeding. However at that point with so many blood products having been transfused he was clearly coagulopathic and started bleeding from multiple points on the brain. It also became very difficult to coagulate bleeding points. More brain was removed to control bleeding and to allow for adequate decompression. We finally achieved sufficient hemostasis and closed the scalp flap. The scalp was closed in a single layer. A sterile dressing was applied.   PLAN OF CARE: Admit to inpatient   PATIENT DISPOSITION:  ICU - intubated and critically ill.   Delay start of Pharmacological VTE agent (>24hrs) due to surgical blood loss or risk of bleeding:  yes

## 2019-07-01 NOTE — Progress Notes (Signed)
Patient ID: COTTON BECKLEY, male   DOB: 08-Nov-1970, 49 y.o.   MRN: [id number] INR. 49 Y RT H M MRSS 0. LSW 1100pm . New onset RT hemiplegia and aphasia. CT brain NO ICH . ASPECTS 6. CTA Occluded LT ICA and Lt MCA and ? LT ACA  CTP core of 266 versus Tma>6.s of and a mis match of 47.Marland Kitchen MRI brain DWI/Flair mismatch.. Following extensive discussions with the spouse and stroke neurology and Dr.Aroor neurohospitalist, given the clinical,infoand the neuroimaging findings it was decided to proceed with endovascular treatment. Spouse aware of the risk of ICH and worsening neuro condition and death.,Informed consent was obtained. S.Joleene Burnham MD

## 2019-07-01 NOTE — Consult Note (Signed)
Reason for Consult:Left MCA infarct Referring Physician: Aroor  Lew Dawes is an 49 y.o. male.  HPI: with an acute infarct left mca territory. 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  History reviewed. No pertinent past medical history.  History reviewed. No pertinent surgical history.  History reviewed. No pertinent family history.  Social History:  reports that he has been smoking. He does not have any smokeless tobacco history on file. No history on file for alcohol and drug.  Allergies: Not on File  Medications: I have reviewed the patient's current medications.  Results for orders placed or performed during the hospital encounter of 07/01/19 (from the past 48 hour(s))  Ethanol     Status: None   Collection Time: 07/01/19  7:59 AM  Result Value Ref Range   Alcohol, Ethyl (B) <10 <10 mg/dL    Comment: (NOTE) Lowest detectable limit for serum alcohol is 10 mg/dL. For medical purposes only. Performed at [facility], [address], [place], [place] [address]   Protime-INR     Status: None   Collection Time: 07/01/19  7:59 AM  Result Value Ref Range   Prothrombin Time 12.8 11.4 - 15.2 seconds   INR 1.0 0.8 - 1.2    Comment: (NOTE) INR goal varies based on device and disease states. Performed at [facility], [address] [place], [place] [address]   APTT     Status: None   Collection Time: 07/01/19  7:59 AM  Result Value Ref Range   aPTT 26 24 - 36 seconds    Comment: Performed at [facility], [address] [place], [place] [address]  CBC     Status: None   Collection Time: 07/01/19  7:59 AM  Result Value Ref Range   WBC 6.6 4.0 - 10.5 K/uL   RBC 5.24 4.22 - 5.81 MIL/uL   Hemoglobin 15.9 13.0 - 17.0 g/dL   HCT 16.6 06.3 - 01.6 %   MCV 92.2 80.0 - 100.0 fL   MCH 30.3 26.0 - 34.0 pg   MCHC 32.9 30.0 - 36.0 g/dL   RDW  01.0 93.2 - 35.5 %   Platelets 166 150 - 400 K/uL   nRBC 0.0 0.0 - 0.2 %    Comment: Performed at [facility], [address] [place], [place] [address]  Differential     Status: None   Collection Time: 07/01/19  7:59 AM  Result Value Ref Range   Neutrophils Relative % 64 %   Neutro Abs 4.3 1.7 - 7.7 K/uL   Lymphocytes Relative 24 %   Lymphs Abs 1.6 0.7 - 4.0 K/uL   Monocytes Relative 8 %   Monocytes Absolute 0.5 0.1 - 1.0 K/uL   Eosinophils Relative 2 %   Eosinophils Absolute 0.1 0.0 - 0.5 K/uL   Basophils Relative 1 %   Basophils Absolute 0.0 0.0 - 0.1 K/uL   Immature Granulocytes 1 %   Abs Immature Granulocytes 0.03 0.00 - 0.07 K/uL    Comment: Performed at [facility], [address] [place], [place] [address]  Comprehensive metabolic panel     Status: Abnormal   Collection Time: 07/01/19  7:59 AM  Result Value Ref Range   Sodium 139 135 - 145 mmol/L   Potassium 4.1 3.5 - 5.1 mmol/L   Chloride 108 98 -  111 mmol/L   CO2 25 22 - 32 mmol/L   Glucose, Bld 160 (H) 70 - 99 mg/dL    Comment: Glucose reference range applies only to samples taken after fasting for at least 8 hours.   BUN 7 6 - 20 mg/dL   Creatinine, Ser 1.61 0.61 - 1.24 mg/dL   Calcium 8.6 (L) 8.9 - 10.3 mg/dL   Total Protein 5.8 (L) 6.5 - 8.1 g/dL   Albumin 3.4 (L) 3.5 - 5.0 g/dL   AST 22 15 - 41 U/L   ALT 27 0 - 44 U/L   Alkaline Phosphatase 50 38 - 126 U/L   Total Bilirubin 0.6 0.3 - 1.2 mg/dL   GFR calc non Af Amer >60 >60 mL/min   GFR calc Af Amer >60 >60 mL/min   Anion gap 6 5 - 15    Comment: Performed at [facility], [address], [place], [place] [address]  CBG monitoring, ED     Status: Abnormal   Collection Time: 07/01/19  8:00 AM  Result Value Ref Range   Glucose-Capillary 144 (H) 70 - 99 mg/dL    Comment: Glucose reference range applies only to samples taken after fasting for at least 8 hours.  I-stat chem 8, ED     Status: Abnormal   Collection Time: 07/01/19  8:04 AM   Result Value Ref Range   Sodium 139 135 - 145 mmol/L   Potassium 3.9 3.5 - 5.1 mmol/L   Chloride 105 98 - 111 mmol/L   BUN 7 6 - 20 mg/dL   Creatinine, Ser 5.40 0.61 - 1.24 mg/dL   Glucose, Bld 981 (H) 70 - 99 mg/dL    Comment: Glucose reference range applies only to samples taken after fasting for at least 8 hours.   Calcium, Ion 0.86 (LL) 1.15 - 1.40 mmol/L   TCO2 27 22 - 32 mmol/L   Hemoglobin 16.0 13.0 - 17.0 g/dL   HCT 19.1 47.8 - 29.5 %  Respiratory Panel by RT PCR (Flu A&B, Covid) - Nasopharyngeal Swab     Status: None   Collection Time: 07/01/19  8:23 AM   Specimen: Nasopharyngeal Swab  Result Value Ref Range   SARS Coronavirus 2 by RT PCR NEGATIVE NEGATIVE    Comment: (NOTE) SARS-CoV-2 target nucleic acids are NOT DETECTED. The SARS-CoV-2 RNA is generally detectable in upper respiratoy specimens during the acute phase of infection. The lowest concentration of SARS-CoV-2 viral copies this assay can detect is 131 copies/mL. A negative result does not preclude SARS-Cov-2 infection and should not be used as the sole basis for treatment or other patient management decisions. A negative result may occur with  improper specimen collection/handling, submission of specimen other than nasopharyngeal swab, presence of viral mutation(s) within the areas targeted by this assay, and inadequate number of viral copies (<131 copies/mL). A negative result must be combined with clinical observations, patient history, and epidemiological information. The expected result is Negative. Fact Sheet for Patients:  https://www.moore.com/ Fact Sheet for Healthcare Providers:  https://www.young.biz/ This test is not yet ap proved or cleared by the [place] FDA and  has been authorized for detection and/or diagnosis of SARS-CoV-2 by FDA under an Emergency Use Authorization (EUA). This EUA will remain  in effect (meaning this test can be used) for the  duration of the COVID-19 declaration under Section 564(b)(1) of the Act, 21 U.S.C. section 360bbb-3(b)(1), unless the authorization is terminated or revoked sooner.    Influenza A by  PCR NEGATIVE NEGATIVE   Influenza B by PCR NEGATIVE NEGATIVE    Comment: (NOTE) The Xpert Xpress SARS-CoV-2/FLU/RSV assay is intended as an aid in  the diagnosis of influenza from Nasopharyngeal swab specimens and  should not be used as a sole basis for treatment. Nasal washings and  aspirates are unacceptable for Xpert Xpress SARS-CoV-2/FLU/RSV  testing. Fact Sheet for Patients: https://www.moore.com/ Fact Sheet for Healthcare Providers: https://www.young.biz/ This test is not yet approved or cleared by the [place] FDA and  has been authorized for detection and/or diagnosis of SARS-CoV-2 by  FDA under an Emergency Use Authorization (EUA). This EUA will remain  in effect (meaning this test can be used) for the duration of the  Covid-19 declaration under Section 564(b)(1) of the Act, 21  U.S.C. section 360bbb-3(b)(1), unless the authorization is  terminated or revoked. Performed at [facility], [address] [place], [place] [address]   Blood gas, arterial     Status: Abnormal   Collection Time: 07/01/19  1:55 PM  Result Value Ref Range   FIO2 100.00    pH, Arterial 7.291 (L) 7.350 - 7.450   pCO2 arterial 56.4 (H) 32.0 - 48.0 mmHg   pO2, Arterial 310 (H) 83.0 - 108.0 mmHg   Bicarbonate 26.3 20.0 - 28.0 mmol/L   Acid-Base Excess 0.5 0.0 - 2.0 mmol/L   O2 Saturation 99.4 %   Patient temperature 37.0    Collection site A-LINE    Drawn by [id number]    Sample type ARTERIAL DRAW    Allens test (pass/fail) A-LINE (A) PASS    Comment: Performed at [facility], [address], [place], [place] [address]    CT Angio Head W/Cm &/Or Wo Cm  Result Date: 07/01/2019 CLINICAL DATA:  Stroke.  Right-sided weakness and aphasia. EXAM: CT ANGIOGRAPHY HEAD  AND NECK CT PERFUSION BRAIN TECHNIQUE: Multidetector CT imaging of the head and neck was performed using the standard protocol during bolus administration of intravenous contrast. Multiplanar CT image reconstructions and MIPs were obtained to evaluate the vascular anatomy. Carotid stenosis measurements (when applicable) are obtained utilizing NASCET criteria, using the distal internal carotid diameter as the denominator. Multiphase CT imaging of the brain was performed following IV bolus contrast injection. Subsequent parametric perfusion maps were calculated using RAPID software. CONTRAST:  OMNIPAQUE IOHEXOL 350 MG/ML SOLN COMPARISON:  CT head earlier today FINDINGS: CTA NECK FINDINGS Aortic arch: Standard branching. Imaged portion shows no evidence of aneurysm or dissection. No significant stenosis of the major arch vessel origins. Right carotid system: Normal right carotid without stenosis or atherosclerotic disease Left carotid system: Left carotid bifurcation patent without atherosclerotic disease. There is tapering of the left internal carotid artery proximally with occlusion in the mid neck. There is no atherosclerotic calcification. This is likely due to dissection. Vertebral arteries: Both vertebral arteries widely patent. Skeleton: No acute skeletal abnormality. Other neck: 12 mm left thyroid nodule. No mass or adenopathy in the neck. Upper chest: Mild apical emphysema without acute abnormality. Review of the MIP images confirms the above findings CTA HEAD FINDINGS Anterior circulation: Left internal carotid artery is occluded from the mid neck through the terminus. There is occlusion of the left middle cerebral arteries involving the left M1 M2 and M3 branches. There is poor collateral circulation. Right internal carotid artery widely patent. Right anterior and middle cerebral arteries patent. Left anterior cerebral artery is patent supplied from the right. Posterior circulation: Both vertebral  arteries patent to the  basilar. PICA patent bilaterally. Superior cerebellar and posterior cerebral arteries patent without stenosis. Venous sinuses: Superior sagittal sinus and right transverse sinus widely patent. Limited opacification of left transverse sinus likely due to arterial occlusion on the left. Anatomic variants: None Review of the MIP images confirms the above findings CT Brain Perfusion Findings: ASPECTS: Original report is 10. However in light of the CT perfusion and in further review of the CT, there are subtle low-density changes in the left hemisphere compatible with acute infarct. Updated aspects is 6 or 7. CBF (<30%) Volume: Perfusion (Tmax>6.0s) volume: Mismatch Volume: 15mL Infarction Location:Left hemispheric infarct involving the left frontotemporal and parietal lobe. IMPRESSION: 1. 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 The patient has minimal atherosclerotic disease. The left internal carotid artery is occluded through the terminus and extending in the left M1 M2 and M3 branches. There is poor collateral circulation on the left. There is a large territory infarct involving the left hemisphere. Infarct volume 266 mL. Electronically Signed   By: Marlan Palau M.D.   On: 07/01/2019 08:36   DG Chest 1 View  Result Date: 07/01/2019 CLINICAL DATA:  Encounter for imaging to screen for metal prior to MRI. EXAM: CHEST  1 VIEW COMPARISON:  None. FINDINGS: There are no metallic foreign objects in the chest. Heart size and vascularity are normal. Lungs are clear. Bones are normal. IMPRESSION: Normal chest. Specifically, no evidence of metallic objects in the chest. Electronically Signed   By: Francene Boyers M.D.   On: 07/01/2019 09:13   DG Pelvis 1-2 Views  Result Date: 07/01/2019 CLINICAL DATA:  Encounter for imaging to screen for metal prior to MRI. EXAM: PELVIS - 1-2 VIEW COMPARISON:  None. FINDINGS: There are no metallic  objects in the pelvis. Bones are normal. Contrast in the left ureter and bladder. IMPRESSION: Normal exam. No metallic objects in the pelvis. Electronically Signed   By: Francene Boyers M.D.   On: 07/01/2019 09:15   DG Abd 1 View  Result Date: 07/01/2019 CLINICAL DATA:  Encounter for imaging to screen for metal prior to MRI. EXAM: ABDOMEN - 1 VIEW COMPARISON:  None. FINDINGS: There are no metallic objects in the abdomen or pelvis. The bowel gas pattern is normal. Contrast is seen in the renal collecting systems and bladder. Bones are normal. IMPRESSION: Normal exam.  No metallic objects in the abdomen. Electronically Signed   By: Francene Boyers M.D.   On: 07/01/2019 09:14   CT HEAD WO CONTRAST  Result Date: 07/01/2019 CLINICAL DATA:  Stroke, follow-up. EXAM: CT HEAD WITHOUT CONTRAST TECHNIQUE: Contiguous axial images were obtained from the base of the skull through the vertex without intravenous contrast. COMPARISON:  Brain MRI 07/01/2019, noncontrast head CT, CT angiogram head/neck and CT perfusion head performed earlier the same day 07/01/2019 FINDINGS: Brain: Residual circulating contrast material. There is irregular hyperdensity centered within the left basal ganglia, left subinsular region and inferomedial left temporal lobe measuring 3.0 x 2.6 x 3.9 cm (AP x TV x CC) likely reflecting a combination of parenchymal hemorrhage and contrast staining. There is scattered small volume subarachnoid hemorrhage along the left cerebral hemisphere most notably within the left MCA cistern, left sylvian fissure and along the left frontoparietal convexity. There is edema with loss of gray-white differentiation within the left basal ganglia, left insula and anterior left temporal lobe, likely acute infarction. Subtle changes of acute infarction are also suspected within the paramedian anterior left  frontal lobe ACA vascular territory. There is mass effect with effacement of the temporal horn of the left lateral  ventricle no midline shift. No hydrocephalus. Vascular: Circulating contrast material limits evaluation for abnormal vessel hyperdensity. Skull: Normal. Negative for fracture or focal lesion. Sinuses/Orbits: Visualized orbits show no acute finding. Ethmoid sinus mucosal thickening. Small left sphenoid sinus mucous retention cyst. No significant mastoid effusion. Partially visualized life-support tubes. These results were called by telephone at the time of interpretation on 07/01/2019 at 12:15 pm to provider Dr. Laurence Slate, who verbally acknowledged these results. IMPRESSION: 1. 3.0 x 2.6 x 3.9 cm region of hyperdensity centered within the left basal ganglia, left subinsular region and inferomedial left temporal lobe likely reflecting a combination of parenchymal hematoma and contrast staining. 2. Edema with loss of gray-white differentiation within the left basal ganglia, left insula and anterior left temporal lobe, likely acute infarction. Subtle changes of acute infarction are also suspected within the paramedian left frontal lobe ACA vascular territory. 3. Scattered small-volume subarachnoid hemorrhage along the left cerebral hemisphere. 4. Regional mass effect with effacement of the left lateral ventricle temporal horn. No midline shift. Electronically Signed   By: Jackey Loge DO   On: 07/01/2019 12:19   CT Angio Neck W and/or Wo Contrast  Result Date: 07/01/2019 CLINICAL DATA:  Stroke.  Right-sided weakness and aphasia. EXAM: CT ANGIOGRAPHY HEAD AND NECK CT PERFUSION BRAIN TECHNIQUE: Multidetector CT imaging of the head and neck was performed using the standard protocol during bolus administration of intravenous contrast. Multiplanar CT image reconstructions and MIPs were obtained to evaluate the vascular anatomy. Carotid stenosis measurements (when applicable) are obtained utilizing NASCET criteria, using the distal internal carotid diameter as the denominator. Multiphase CT imaging of the brain was performed  following IV bolus contrast injection. Subsequent parametric perfusion maps were calculated using RAPID software. CONTRAST:  OMNIPAQUE IOHEXOL 350 MG/ML SOLN COMPARISON:  CT head earlier today FINDINGS: CTA NECK FINDINGS Aortic arch: Standard branching. Imaged portion shows no evidence of aneurysm or dissection. No significant stenosis of the major arch vessel origins. Right carotid system: Normal right carotid without stenosis or atherosclerotic disease Left carotid system: Left carotid bifurcation patent without atherosclerotic disease. There is tapering of the left internal carotid artery proximally with occlusion in the mid neck. There is no atherosclerotic calcification. This is likely due to dissection. Vertebral arteries: Both vertebral arteries widely patent. Skeleton: No acute skeletal abnormality. Other neck: 12 mm left thyroid nodule. No mass or adenopathy in the neck. Upper chest: Mild apical emphysema without acute abnormality. Review of the MIP images confirms the above findings CTA HEAD FINDINGS Anterior circulation: Left internal carotid artery is occluded from the mid neck through the terminus. There is occlusion of the left middle cerebral arteries involving the left M1 M2 and M3 branches. There is poor collateral circulation. Right internal carotid artery widely patent. Right anterior and middle cerebral arteries patent. Left anterior cerebral artery is patent supplied from the right. Posterior circulation: Both vertebral arteries patent to the basilar. PICA patent bilaterally. Superior cerebellar and posterior cerebral arteries patent without stenosis. Venous sinuses: Superior sagittal sinus and right transverse sinus widely patent. Limited opacification of left transverse sinus likely due to arterial occlusion on the left. Anatomic variants: None Review of the MIP images confirms the above findings CT Brain Perfusion Findings: ASPECTS: Original report is 10. However in light of the CT  perfusion and in further review of the CT, there are subtle low-density changes in the left  hemisphere compatible with acute infarct. Updated aspects is 6 or 7. CBF (<30%) Volume: 245mL Perfusion (Tmax>6.0s) volume: 376mL Mismatch Volume: 98mL Infarction Location:Left hemispheric infarct involving the left frontotemporal and parietal lobe. IMPRESSION: 1. 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 The patient has minimal atherosclerotic disease. The left internal carotid artery is occluded through the terminus and extending in the left M1 M2 and M3 branches. There is poor collateral circulation on the left. There is a large territory infarct involving the left hemisphere. Infarct volume 266 mL. Electronically Signed   By: Franchot Gallo M.D.   On: 07/01/2019 08:36   MR ANGIO HEAD WO CONTRAST  Result Date: 07/01/2019 CLINICAL DATA:  Stroke, follow-up.  Status post intervention. EXAM: MRI HEAD WITHOUT CONTRAST MRA HEAD WITHOUT CONTRAST TECHNIQUE: Multiplanar, multiecho pulse sequences of the brain and surrounding structures were obtained without intravenous contrast. Angiographic images of the head were obtained using MRA technique without contrast. COMPARISON:  Noncontrast head CT performed earlier the same day at 11:50 a.m, brain MRI performed earlier the same day at 1:04 a.m., CTA head/neck and CT perfusion head performed earlier the same day 07/01/2019 FINDINGS: MRI HEAD FINDINGS Brain: A limited protocol brain MRI was performed at the request of the ordering clinician. Only axial and coronal diffusion-weighted imaging, an axial T2* sequence and an axial T2/FLAIR sequence were obtained. There is a large region of cortical/subcortical restricted diffusion consistent with acute ischemia within the left cerebral hemisphere throughout the majority of the left MCA and ACA vascular territories. There is involvement of the left frontal, parietal and temporal lobes as  well as mild involvement of the lateral left occipital lobe. Restricted diffusion is also present throughout the majority of the left basal ganglia and within the left internal capsule. The acute parenchymal hemorrhage and/or contrast staining centered within the left basal ganglia and inferomedial left temporal lobe on prior CT does not appear significantly changed by MR modality. Based on the MR appearance, it is suspected that a significant proportion of the hyperdensity demonstrated on prior CT reflects contrast staining. Scattered sulcal FLAIR hyperintensity along the left cerebral hemisphere consistent with previously demonstrated small volume acute subarachnoid hemorrhage. Small volume subarachnoid hemorrhage is also questioned along the posterior right cerebral hemisphere. No significant mass effect on the current exam.  No midline shift. Vascular: Reported below Skull and upper cervical spine: No focal marrow lesion is identified on the acquired sequences. Sinuses/Orbits: No evidence of acute orbital abnormality on the acquired sequences. Redemonstrated paranasal sinus mucosal thickening greatest within the bilateral ethmoid air cells. MRA HEAD FINDINGS Some flow related signal is identified within the proximal left internal carotid artery. However, there is no significant flow related signal arising from the more distal left ICA siphon or supraclinoid segment. Findings likely reflect interval reocclusion of the intracranial left ICA. The M1 left MCA and more distal left MCA branch vessels demonstrate no flow related signal. Only minimal irregular flow related signal is seen within the A1 left ACA and proximal A2 left ACA. More distally, the left ACA is poorly delineated. The intracranial right internal carotid artery is patent without significant stenosis. The M1 middle cerebral artery is patent without significant stenosis. No right M2 proximal branch occlusion is identified. The right anterior cerebral  artery is patent without significant stenosis. The visualized intracranial vertebral arteries are patent without significant stenosis, as is the basilar artery. The bilateral posterior cerebral arteries are patent without significant proximal stenosis. No  intracranial aneurysm is identified. These results were called by telephone at the time of interpretation on 07/01/2019 at 12:55 pm to provider Arther DamesSUSHANTH AROOR , who verbally acknowledged these results. IMPRESSION: MRI brain: 1. Restricted diffusion throughout the majority of the left MCA and ACA vascular territories consistent with acute ischemia. 2. No significant mass effect at this time. No midline shift. 3. The acute parenchymal hemorrhage and/or contrast staining centered within left basal ganglia and inferomedial left temporal lobe on prior head CT does not appear significantly changed. Based on the MR appearance, it is suspected that a significant component of this previously demonstrated hyperdensity reflects contrast staining. 4. Redemonstrated small volume subarachnoid hemorrhage overlying the left cerebral hemisphere. Small volume subarachnoid hemorrhage is also questioned along the right cerebral hemisphere posteriorly. MRA head: Findings consistent with reocclusion of the intracranial left internal carotid artery. No flow related signal is seen within the left middle cerebral artery. There is only minimal, irregular flow related signal within the A1 and proximal A2 left ACA with the left ACA likely functionally occluded. Electronically Signed   By: Jackey LogeKyle  Golden DO   On: 07/01/2019 13:37   MR BRAIN WO CONTRAST  Result Date: 07/01/2019 CLINICAL DATA:  Stroke, follow-up.  Status post intervention. EXAM: MRI HEAD WITHOUT CONTRAST MRA HEAD WITHOUT CONTRAST TECHNIQUE: Multiplanar, multiecho pulse sequences of the brain and surrounding structures were obtained without intravenous contrast. Angiographic images of the head were obtained using MRA technique  without contrast. COMPARISON:  Noncontrast head CT performed earlier the same day at 11:50 a.m, brain MRI performed earlier the same day at 1:04 a.m., CTA head/neck and CT perfusion head performed earlier the same day 07/01/2019 FINDINGS: MRI HEAD FINDINGS Brain: A limited protocol brain MRI was performed at the request of the ordering clinician. Only axial and coronal diffusion-weighted imaging, an axial T2* sequence and an axial T2/FLAIR sequence were obtained. There is a large region of cortical/subcortical restricted diffusion consistent with acute ischemia within the left cerebral hemisphere throughout the majority of the left MCA and ACA vascular territories. There is involvement of the left frontal, parietal and temporal lobes as well as mild involvement of the lateral left occipital lobe. Restricted diffusion is also present throughout the majority of the left basal ganglia and within the left internal capsule. The acute parenchymal hemorrhage and/or contrast staining centered within the left basal ganglia and inferomedial left temporal lobe on prior CT does not appear significantly changed by MR modality. Based on the MR appearance, it is suspected that a significant proportion of the hyperdensity demonstrated on prior CT reflects contrast staining. Scattered sulcal FLAIR hyperintensity along the left cerebral hemisphere consistent with previously demonstrated small volume acute subarachnoid hemorrhage. Small volume subarachnoid hemorrhage is also questioned along the posterior right cerebral hemisphere. No significant mass effect on the current exam.  No midline shift. Vascular: Reported below Skull and upper cervical spine: No focal marrow lesion is identified on the acquired sequences. Sinuses/Orbits: No evidence of acute orbital abnormality on the acquired sequences. Redemonstrated paranasal sinus mucosal thickening greatest within the bilateral ethmoid air cells. MRA HEAD FINDINGS Some flow related  signal is identified within the proximal left internal carotid artery. However, there is no significant flow related signal arising from the more distal left ICA siphon or supraclinoid segment. Findings likely reflect interval reocclusion of the intracranial left ICA. The M1 left MCA and more distal left MCA branch vessels demonstrate no flow related signal. Only minimal irregular flow related signal is seen within the  A1 left ACA and proximal A2 left ACA. More distally, the left ACA is poorly delineated. The intracranial right internal carotid artery is patent without significant stenosis. The M1 middle cerebral artery is patent without significant stenosis. No right M2 proximal branch occlusion is identified. The right anterior cerebral artery is patent without significant stenosis. The visualized intracranial vertebral arteries are patent without significant stenosis, as is the basilar artery. The bilateral posterior cerebral arteries are patent without significant proximal stenosis. No intracranial aneurysm is identified. These results were called by telephone at the time of interpretation on 07/01/2019 at 12:55 pm to provider Arther Dames , who verbally acknowledged these results. IMPRESSION: MRI brain: 1. Restricted diffusion throughout the majority of the left MCA and ACA vascular territories consistent with acute ischemia. 2. No significant mass effect at this time. No midline shift. 3. The acute parenchymal hemorrhage and/or contrast staining centered within left basal ganglia and inferomedial left temporal lobe on prior head CT does not appear significantly changed. Based on the MR appearance, it is suspected that a significant component of this previously demonstrated hyperdensity reflects contrast staining. 4. Redemonstrated small volume subarachnoid hemorrhage overlying the left cerebral hemisphere. Small volume subarachnoid hemorrhage is also questioned along the right cerebral hemisphere posteriorly.  MRA head: Findings consistent with reocclusion of the intracranial left internal carotid artery. No flow related signal is seen within the left middle cerebral artery. There is only minimal, irregular flow related signal within the A1 and proximal A2 left ACA with the left ACA likely functionally occluded. Electronically Signed   By: Jackey Loge DO   On: 07/01/2019 13:37   MR BRAIN WO CONTRAST  Addendum Date: 07/01/2019   ADDENDUM REPORT: 07/01/2019 09:33 ADDENDUM: These results were called by telephone at the time of interpretation on 07/01/2019 at 9:33 am to provider Arther Dames , who verbally acknowledged these results. Electronically Signed   By: Jackey Loge DO   On: 07/01/2019 09:33   Result Date: 07/01/2019 CLINICAL DATA:  Stroke/TIA, assess extracranial arteries. Code stroke. EXAM: MRI HEAD WITHOUT CONTRAST TECHNIQUE: Multiplanar, multiecho pulse sequences of the brain and surrounding structures were obtained without intravenous contrast. COMPARISON:  CT angiogram head/neck and non-contrast head CT performed earlier the same day 07/01/2019 FINDINGS: Brain: A limited protocol brain MRI was performed at the ordering provider's request. Only axial diffusion-weighted imaging and axial T2 FLAIR sequence were obtained. The axial diffusion-weighted sequence is severely motion degraded. Moderate motion degradation of the axial T2 FLAIR sequence. There is restricted diffusion within the left cerebral hemisphere and left basal ganglia throughout much of the left MCA vascular territory consistent with acute ischemia. Restricted diffusion is also present within the paramedian left frontoparietal lobes within the left ACA vascular territory consistent with acute ischemia, although this is less well assessed due to the degree of motion degradation at the level of the vertex. There is little if any corresponding T2/FLAIR hyperintensity at these sites. No significant mass effect on the current exam.  No midline  shift. Vascular: Poorly assessed on the acquired sequences. Skull and upper cervical spine: No focal calvarial lesion is identified on the acquired sequences. Sinuses/Orbits: No acute orbital abnormality is identified on the acquired sequences. Mild ethmoid sinus mucosal thickening. Left sphenoid sinus mucous retention cyst. IMPRESSION: Severely motion degraded diffusion-weighted imaging. Restricted diffusion throughout much of the left MCA vascular territory consistent with acute ischemia. Restricted diffusion consistent with acute ischemia is also present within the paramedian left frontoparietal lobes, although this is less well  assessed due to the degree of motion degradation at the level of the vertex. There is little if any corresponding T2/FLAIR hyperintensity at these sites. No significant mass effect.  No midline shift. Electronically Signed: By: Jackey Loge DO On: 07/01/2019 09:23   CT CEREBRAL PERFUSION W CONTRAST  Result Date: 07/01/2019 CLINICAL DATA:  Stroke.  Right-sided weakness and aphasia. EXAM: CT ANGIOGRAPHY HEAD AND NECK CT PERFUSION BRAIN TECHNIQUE: Multidetector CT imaging of the head and neck was performed using the standard protocol during bolus administration of intravenous contrast. Multiplanar CT image reconstructions and MIPs were obtained to evaluate the vascular anatomy. Carotid stenosis measurements (when applicable) are obtained utilizing NASCET criteria, using the distal internal carotid diameter as the denominator. Multiphase CT imaging of the brain was performed following IV bolus contrast injection. Subsequent parametric perfusion maps were calculated using RAPID software. CONTRAST:  OMNIPAQUE IOHEXOL 350 MG/ML SOLN COMPARISON:  CT head earlier today FINDINGS: CTA NECK FINDINGS Aortic arch: Standard branching. Imaged portion shows no evidence of aneurysm or dissection. No significant stenosis of the major arch vessel origins. Right carotid system: Normal right carotid  without stenosis or atherosclerotic disease Left carotid system: Left carotid bifurcation patent without atherosclerotic disease. There is tapering of the left internal carotid artery proximally with occlusion in the mid neck. There is no atherosclerotic calcification. This is likely due to dissection. Vertebral arteries: Both vertebral arteries widely patent. Skeleton: No acute skeletal abnormality. Other neck: 12 mm left thyroid nodule. No mass or adenopathy in the neck. Upper chest: Mild apical emphysema without acute abnormality. Review of the MIP images confirms the above findings CTA HEAD FINDINGS Anterior circulation: Left internal carotid artery is occluded from the mid neck through the terminus. There is occlusion of the left middle cerebral arteries involving the left M1 M2 and M3 branches. There is poor collateral circulation. Right internal carotid artery widely patent. Right anterior and middle cerebral arteries patent. Left anterior cerebral artery is patent supplied from the right. Posterior circulation: Both vertebral arteries patent to the basilar. PICA patent bilaterally. Superior cerebellar and posterior cerebral arteries patent without stenosis. Venous sinuses: Superior sagittal sinus and right transverse sinus widely patent. Limited opacification of left transverse sinus likely due to arterial occlusion on the left. Anatomic variants: None Review of the MIP images confirms the above findings CT Brain Perfusion Findings: ASPECTS: Original report is 10. However in light of the CT perfusion and in further review of the CT, there are subtle low-density changes in the left hemisphere compatible with acute infarct. Updated aspects is 6 or 7. CBF (<30%) Volume: Perfusion (Tmax>6.0s) volume: Mismatch Volume: 34mL Infarction Location:Left hemispheric infarct involving the left frontotemporal and parietal lobe. IMPRESSION: 1. 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 The patient has minimal atherosclerotic disease. The left internal carotid artery is occluded through the terminus and extending in the left M1 M2 and M3 branches. There is poor collateral circulation on the left. There is a large territory infarct involving the left hemisphere. Infarct volume 266 mL. Electronically Signed   By: Marlan Palau M.D.   On: 07/01/2019 08:36   DG Chest Port 1 View  Result Date: 07/01/2019 CLINICAL DATA:  Code stroke.  Respiratory failure. EXAM: PORTABLE CHEST 1 VIEW COMPARISON:  07/01/2019 FINDINGS: An endotracheal tube is identified with tip 4.5 cm above the carina. The cardiomediastinal silhouette is unremarkable. Mild pulmonary vascular congestion is present. There is no evidence of  focal airspace disease, pulmonary edema, suspicious pulmonary nodule/mass, pleural effusion, or pneumothorax. No acute bony abnormalities are identified. IMPRESSION: Mild pulmonary vascular congestion. Endotracheal tube with tip 4.5 cm above the carina. Electronically Signed   By: Harmon Pier M.D.   On: 07/01/2019 14:28   CT HEAD CODE STROKE WO CONTRAST  Result Date: 07/01/2019 CLINICAL DATA:  Code stroke.  Right-sided weakness aphasia EXAM: CT HEAD WITHOUT CONTRAST TECHNIQUE: Contiguous axial images were obtained from the base of the skull through the vertex without intravenous contrast. COMPARISON:  None. FINDINGS: Brain: No evidence of acute infarction, hemorrhage, hydrocephalus, extra-axial collection or mass lesion/mass effect. Vascular: Hyperdense left MCA and terminal left ICA compatible with acute thrombus. Skull: Negative Sinuses/Orbits: Mild mucosal edema paranasal sinuses. Negative orbit Other: None ASPECTS (Alberta Stroke Program Early CT Score) - Ganglionic level infarction (caudate, lentiform nuclei, internal capsule, insula, M1-M3 cortex): 7 - Supraganglionic infarction (M4-M6 cortex): 3 Total score (0-10 with 10 being normal): 10 IMPRESSION: 1. Hyperdense  left ICA and MCA compatible with acute thrombus. No acute infarct or hemorrhage. 2. ASPECTS is 10 3. Results texted to Dr. Laurence Slate via Loretha Stapler. Electronically Signed   By: Marlan Palau M.D.   On: 07/01/2019 08:17    Review of Systems  Unable to perform ROS: Mental status change   Blood pressure 118/69, pulse 95, temperature (!) 97.2 F (36.2 C), resp. rate (!) 23, height 6' (1.829 m), SpO2 97 %. Physical Exam  Constitutional: He appears well-developed and well-nourished. He appears distressed.  HENT:  Head: Normocephalic and atraumatic.  Eyes: Pupils are equal, round, and reactive to light. Conjunctivae and EOM are normal.  Cardiovascular: Normal rate and regular rhythm.  Respiratory:  Intubated, ventilated  GI: Soft. Bowel sounds are normal.  Neurological: He is unresponsive.  Intubated, sedated Moving left side purposefully, no movement on the right Not following commands Perrl, full eom +corneals, +cough,+gag    Assessment/Plan: Emergent OR for left hemicraniectomy secondary to L mca infarct. Risks and benefits were explained to the wife. She understands that his condition is quite poor. She understands and wishes to proceed.   Coletta Memos 07/01/2019, 3:14 PM

## 2019-07-01 NOTE — H&P (Signed)
Neurology history and physical   CC: Patient found with right-sided hemiplegia and aphasia  History is obtained from: Daughter  HPI: Lee Hooper is a 49 y.o. male with history of COPD, tobacco abuse, psoriasis.  Patient was last seen normal at 11:00 by his daughter.  At some point in time patient did get up this morning and she noticed that he had made a couple coffee.  She believes that could have been around 650.  However when daughter did call patient she noted that he was only moaning.  When she went to check on him she found him on the toilet with right-sided hemiplegia and expressive aphasia.  Patient was brought to the hospital without code stroke being called.  Upon entering ED code stroke was called by EDP.     ED course  Stat CT head shows-hyperdense left ICA and MCA compatible with acute thrombus.  No acute infarct hemorrhage.  ASPECTS read as 10 by Dr. Carlis Abbott ( however, more likely 6 to 7 per my read and Dr. Patrecia Pour, per Dr. Pascal Lux -8)   CTA head and neck-occlusion cervical internal carotid artery on the left.  This is most likely due to dissection. The patient has minimal atherosclerotic disease.  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  There is poor collateral circulation on the left.  There is a large territory infarct involving the left hemisphere with volume approximately 266 mL.  CTP-CBF less than 30% with volume of 266 mL, perfusion volume 313 mL, mismatch volume 47 mL.  ASPECTS using CTP by Dr. Carlis Abbott 6 or 7.   MRI brain was severely motion degraded diffusion-imaging.  Restricted diffusion throughout much of the left MCA vascular territory consistent with acute ischemia.  Restricted diffusion consistent with acute ischemia present within the paramedian left frontal parietal lobes.  There is little if any corresponding T2/flair hyperintensity at the sites.  LKW: 11pm tpa given?: no, outside  window Premorbid modified Rankin scale (mRS): 0  PMH - psoriasis.  - COPD   MRS- 0  History reviewed. No pertinent family history.   Social History:  Active smoker   Medications  Current Facility-Administered Medications:  .   stroke: mapping our early stages of recovery book, , Does not apply, Once, Marliss Coots, PA-C .  0.9 %  sodium chloride infusion, , Intravenous, Continuous, Marliss Coots, PA-C .  acetaminophen (TYLENOL) tablet 650 mg, 650 mg, Oral, Q4H PRN **OR** acetaminophen (TYLENOL) 160 MG/5ML solution 650 mg, 650 mg, Per Tube, Q4H PRN **OR** acetaminophen (TYLENOL) suppository 650 mg, 650 mg, Rectal, Q4H PRN, Marliss Coots, PA-C .  aspirin 81 MG chewable tablet, , , ,  .  ceFAZolin (ANCEF) 2-4 GM/100ML-% IVPB, , , ,  .  clopidogrel (PLAVIX) 300 MG tablet, , , ,  .  eptifibatide (INTEGRILIN) 20 MG/10ML injection, , , ,  .  iohexol (OMNIPAQUE) 240 MG/ML injection, , , ,  .  iohexol (OMNIPAQUE) 300 MG/ML solution 150 mL, 150 mL, Intra-arterial, Once PRN, Deveshwar, Sanjeev, MD .  iohexol (OMNIPAQUE) 300 MG/ML solution 50 mL, 50 mL, Intra-arterial, Once PRN, Deveshwar, Sanjeev, MD .  nitroGLYCERIN 100 mcg/mL intra-arterial injection, , , ,  .  nitroGLYCERIN 100 mcg/mL intra-arterial injection, , , ,  .  senna-docusate (Senokot-S) tablet 1 tablet, 1 tablet, Oral, QHS PRN, Marliss Coots, PA-C .  ticagrelor (BRILINTA) 90 MG tablet, , , ,  .  tirofiban (AGGRASTAT) 5-0.9 MG/100ML-% injection, , , ,  .  verapamil (ISOPTIN) 2.5 MG/ML injection, , , ,  No current outpatient medications on file.  Facility-Administered Medications Ordered in Other Encounters:  .  ceFAZolin (ANCEF) IVPB 2 g/50 mL premix, , Intravenous, Anesthesia Intra-op, Annabelle Harman A, CRNA, 2 g at 07/01/19 0933 .  fentaNYL citrate (PF) (SUBLIMAZE) injection, , Intravenous, Anesthesia Intra-op, Annabelle Harman A, CRNA, 100 mcg at 07/01/19 0919 .  lactated ringers infusion, , Intravenous, Continuous PRN,  Waynard Edwards, CRNA, New Bag at 07/01/19 0901 .  propofol (DIPRIVAN) 10 mg/mL bolus/IV push, , Intravenous, Anesthesia Intra-op, Annabelle Harman A, CRNA, 180 mg at 07/01/19 0919 .  rocuronium bromide 10 mg/mL (PF) syringe, , Intravenous, Anesthesia Intra-op, Annabelle Harman A, CRNA, 70 mg at 07/01/19 0926 .  succinylcholine (ANECTINE) injection, , Intravenous, Anesthesia Intra-op, Annabelle Harman A, CRNA, 200 mg at 07/01/19 0919  ROS: Unable to obtain due to altered mental status.    Exam: Current vital signs: BP 133/90   Pulse 61   Resp 19   SpO2 90%  Vital signs in last 24 hours: Pulse Rate:  [61] 61 (04/28 0800) Resp:  [19] 19 (04/28 0800) BP: (133)/(90) 133/90 (04/28 0748) SpO2:  [90 %] 90 % (04/28 0800)   Constitutional: Appears well-developed and well-nourished.  Psych: unable to assess Eyes: No scleral injection HENT: No OP obstrucion Head: Normocephalic.  Cardiovascular: Normal rate and regular rhythm.  Respiratory: Effort normal, non-labored breathing GI: Soft.  No distension. There is no tenderness.  Skin: WDI  Neuro: Mental Status: Patient is lethargic, not following commands, nonverbal, moans.  Cranial Nerves: II: Visual Fields : R homonymous hemianopsia III,IV, VI: EOMI ; left gaze deviation, does not cross midline. Pupils equal, round and reactive to light V: Facial sensation is symmetric to temperature VII: Facial movement is symmetric.  VIII: hearing is intact to voice X: Palat elevates symmetrically XI: Shoulder shrug is symmetric. XII: tongue is midline without atrophy or fasciculations.  Motor:  Sensory: Sensation is symmetric to light touch and temperature in the arms and legs. DSS Deep Tendon Reflexes: 2+ and symmetric in the biceps and patellae.  Plantars: Toes are downgoing bilaterally.  Cerebellar: Unable to assess   NIHSS: 21    Labs I have reviewed labs in epic and the results pertinent to this consultation are:   CBC     Component Value Date/Time   WBC 6.6 07/01/2019 0759   RBC 5.24 07/01/2019 0759   HGB 16.0 07/01/2019 0804   HCT 47.0 07/01/2019 0804   PLT 166 07/01/2019 0759   MCV 92.2 07/01/2019 0759   MCH 30.3 07/01/2019 0759   MCHC 32.9 07/01/2019 0759   RDW 12.6 07/01/2019 0759   LYMPHSABS 1.6 07/01/2019 0759   MONOABS 0.5 07/01/2019 0759   EOSABS 0.1 07/01/2019 0759   BASOSABS 0.0 07/01/2019 0759    CMP     Component Value Date/Time   NA 139 07/01/2019 0804   K 3.9 07/01/2019 0804   CL 105 07/01/2019 0804   CO2 25 07/01/2019 0759   GLUCOSE 151 (H) 07/01/2019 0804   BUN 7 07/01/2019 0804   CREATININE 1.00 07/01/2019 0804   CALCIUM 8.6 (L) 07/01/2019 0759   PROT 5.8 (L) 07/01/2019 0759   ALBUMIN 3.4 (L) 07/01/2019 0759   AST 22 07/01/2019 0759   ALT 27 07/01/2019 0759   ALKPHOS 50 07/01/2019 0759   BILITOT 0.6 07/01/2019 0759   GFRNONAA >60 07/01/2019 0759   GFRAA >  60 07/01/2019 0759      Lee Morn PA-C [facility] [phone]  M-F  (9:00 am- 5:00 PM)  07/01/2019, 9:52 AM   NEUROHOSPITALIST ADDENDUM Performed a face to face diagnostic evaluation.   I have reviewed the contents of history and physical exam as documented by PA/ARNP/Resident and agree with above documentation.  I have discussed and formulated the above plan as documented. Edits to the note have been made as needed.  49 year old male with history of psoriasis, COPD, tobacco abuse presents to the ED after wife found him aphasic and nonverbal, for an unclear reason patient was not brought in as a code stroke.  EDP activated code stroke immediately upon her assessment-NIH stroke scale 21.   Last known normal 11 PM, however patient also noted to have made coffee in the morning and found on the toilet seat.  LVO positive clinically-immediately CT head, CTA and CTP performed.  CT head showed hyperdense left MCA and early acute ischemic changes (there were disagreements and aspects read ranging from  10/10 to as low as 4/10 -2 neurologists and 2 neuroradiologist).  This was important as CT perfusion can be inaccurate, and I suspect was overestimating core.  Therefore we decided to pursue with stat MRI brain-which did show diffusion/FLAIR mismatch.  Detailed conversation was had with the wife regarding the risks of thrombectomy and hemorrhagic conversion.  Explained to patient if we did not proceed with IR procedure, patient would likely infarct majority of the left hemisphere, and would have massive cerebral edema and possibly death due to brain herniation.  While there is increased risk of hemorrhage with thrombectomy, however hemorrhage of would be at the site of already severely infarcted brain.  Wife understands the risks of thrombectomy and agreed for [place] to proceed.  The complex case where CT perfusion has high probability of being inaccurate, and given his last known normal 11 PM and possibly even later as patient found in the toilet seat this morning.  Case was discussed with Stroke director Dr. Pearlean Brownie, Dr. Titus Dubin, interventional neuroradiologist before proceeding with IR.   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  #Repeat MRI of the brain without contrast in 6 hours/CT head #Close monitoring for malignant cerebral edema and possible need for prophylactic hemicraniectomy #Transthoracic Echo  #We will hold aspirin due to high risk of hemorrhage and potential need for hemicraniectomy #Start or continue Atorvastatin 40 mg/other high intensity statin # BP goal: Will be depend on Neuro IR # HBAIC and Lipid profile # Telemetry monitoring # Frequent neuro checks # Stroke swallow screen  COPD - nebs as needed  -Psoriasis Continue home meds   Please page stroke NP  Or  PA  Or MD from 8am -4 pm  as this patient from this time will be  followed by the stroke.   You can look them up on [email or web address]  Password [facility]     Lee Hooper Lee Guidry MD [facility] [phone]   If 7pm to 7am, please call on call as listed on AMION.       This patient is neurologically critically ill due to Left ICA occlusion and left MCA occlusion resulting in left acute stroke.   He is at risk for significant risk of neurological worsening from cerebral edema,  death from brain herniation, heart failure, hemorrhagic conversion, infection, respiratory failure and seizure. This patient's care requires constant monitoring of vital signs, hemodynamics, respiratory and cardiac monitoring, review of multiple databases, neurological assessment, discussion with  family, other specialists and medical decision making of high complexity.  I spent 85 minutes of neurocritical time in the care of this patient.

## 2019-07-01 NOTE — Anesthesia Procedure Notes (Signed)
Arterial Line Insertion Start/End4/28/2021 9:21 AM, 07/01/2019 9:26 AM Performed by: Val Eagle, MD, anesthesiologist  Patient location: OR. Preanesthetic checklist: patient identified, IV checked, site marked, risks and benefits discussed, surgical consent, monitors and equipment checked, pre-op evaluation, timeout performed and anesthesia consent Left, radial was placed Catheter size: 20 G Hand hygiene performed   Attempts: 1 Procedure performed without using ultrasound guided technique. Following insertion, dressing applied and Biopatch. Post procedure assessment: normal and unchanged  Patient tolerated the procedure well with no immediate complications.

## 2019-07-01 NOTE — Progress Notes (Signed)
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  Post thrombectomy CT had done as there was concern for hemorrhage-shows moderate basal ganglia hemorrhage versus contrast.  Discussed with wife that this is likely to be a large stroke, enroll patient CHARM clinical trial to assess benefit of glibenclamide for cerebral edema, however after obtaining MRI brain due to large size-did not meet eligibility criteria for study.  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   Discussed with wife and had long conversation.  Given size of his stroke, he is extremely high risk for developing malignant cerebral edema leading to brain herniation and death.  Discussed option of best medical management-which unlikely he will survive versus prophylactic hemicraniectomy.  Explained to her that prophylactic hemicraniectomy is associated with reduce mortality, however no significant improvement in morbidity. Explained to her best case scenario is that patient has aphasia as well as right-sided weakness, potentially wheelchair bound.  After long conversation, wife would still like to pursue decompressive hemicraniectomy and therefore will consult neurosurgery, will also start patient on hypertonic saline at 75 cc/h 3% normal saline.  CCM has been consulted for management of ventilator and neurosurgery Dr. Coletta Memos was also consulted for prophylactic hemicraniectomy.   CRITICAL CARE Performed by: Dara Lords Xia Stohr   Total critical care time: 45  minutes  Critical care time was exclusive of separately billable procedures and treating other patients.  Critical care was necessary to treat or prevent imminent or life-threatening deterioration.  Critical care was time spent  personally by me on the following activities: development of treatment plan with patient and/or surrogate as well as nursing, discussions with consultants, evaluation of patient's response to treatment, examination of patient, obtaining history from patient or surrogate, ordering and performing treatments and interventions, ordering and review of laboratory studies, ordering and review of radiographic studies, pulse oximetry and re-evaluation of patient's condition.

## 2019-07-01 NOTE — Transfer of Care (Signed)
Immediate Anesthesia Transfer of Care Note  Patient: Lee Hooper  Procedure(s) Performed: IR WITH ANESTHESIA (N/A )  Patient Location: to MRI suite with PACU RN and code stroke RNs taking over care of patient.  Anesthesia Type:General  Level of Consciousness: sedated and Patient remains intubated per anesthesia plan  Airway & Oxygen Therapy: Patient remains intubated per anesthesia plan and Patient placed on Ventilator (see vital sign flow sheet for setting)  Post-op Assessment: Report given to RN and Post -op Vital signs reviewed and stable  Post vital signs: Reviewed and stable  Last Vitals:  Vitals Value Taken Time  BP    Temp    Pulse    Resp    SpO2      Last Pain:  Vitals:   07/01/19 0855  PainSc: 0-No pain         Complications: No apparent anesthesia complications

## 2019-07-01 NOTE — Progress Notes (Signed)
RT note-Patient transferred to CT/MRI/and now in the PACU, remains on current setting, moderate to large amount secretions, scatt rhonchi t/o, continue to monitor.

## 2019-07-01 NOTE — Anesthesia Procedure Notes (Signed)
Procedure Name: Intubation Date/Time: 07/01/2019 9:21 AM Performed by: Waynard Edwards, CRNA Pre-anesthesia Checklist: Patient identified, Emergency Drugs available, Suction available and Patient being monitored Patient Re-evaluated:Patient Re-evaluated prior to induction Oxygen Delivery Method: Circle system utilized Preoxygenation: Pre-oxygenation with 100% oxygen Induction Type: IV induction and Rapid sequence Laryngoscope Size: Glidescope and 4 Grade View: Grade I Tube type: Oral Tube size: 8.0 mm Number of attempts: 1 Airway Equipment and Method: Stylet Placement Confirmation: ETT inserted through vocal cords under direct vision,  positive ETCO2 and breath sounds checked- equal and bilateral Secured at: 24 cm Tube secured with: Tape Dental Injury: Teeth and Oropharynx as per pre-operative assessment

## 2019-07-01 NOTE — Anesthesia Preprocedure Evaluation (Addendum)
Anesthesia Evaluation  Patient identified by MRN, date of birth, ID band Patient unresponsive  Preop documentation limited or incomplete due to emergent nature of procedure.  Airway Mallampati: Intubated  TM Distance: >3 FB     Dental  (+) Dental Advisory Given   Pulmonary Current Smoker,  intubated   breath sounds clear to auscultation       Cardiovascular  Rhythm:Regular Rate:Normal     Neuro/Psych CVA (obtunded)    GI/Hepatic   Endo/Other    Renal/GU      Musculoskeletal   Abdominal   Peds  Hematology   Anesthesia Other Findings   Reproductive/Obstetrics                            Anesthesia Physical Anesthesia Plan  ASA: V and emergent  Anesthesia Plan: General   Post-op Pain Management:    Induction: Inhalational  PONV Risk Score and Plan: 1 and Ondansetron, Dexamethasone and Treatment may vary due to age or medical condition  Airway Management Planned: Oral ETT  Additional Equipment: Arterial line  Intra-op Plan:   Post-operative Plan: Post-operative intubation/ventilation  Informed Consent: I have reviewed the patients History and Physical, chart, labs and discussed the procedure including the risks, benefits and alternatives for the proposed anesthesia with the patient or authorized representative who has indicated his/her understanding and acceptance.     Dental advisory given  Plan Discussed with: CRNA  Anesthesia Plan Comments:        Anesthesia Quick Evaluation

## 2019-07-01 NOTE — Transfer of Care (Signed)
Immediate Anesthesia Transfer of Care Note  Patient: ORION MOLE  Procedure(s) Performed: Left Hemicraniectomy (Left Head)  Patient Location: ICU  Anesthesia Type:General  Level of Consciousness: Patient remains intubated per anesthesia plan  Airway & Oxygen Therapy: Patient remains intubated per anesthesia plan and Patient placed on Ventilator (see vital sign flow sheet for setting)  Post-op Assessment: Report given to RN and Post -op Vital signs reviewed and unstable, Anesthesiologist notified  Post vital signs: Reviewed and unstable  Last Vitals:  Vitals Value Taken Time  BP 135/79 07/01/19 1913  Temp    Pulse 63 07/01/19 1924  Resp 16 07/01/19 1924  SpO2 100 % 07/01/19 1924  Vitals shown include unvalidated device data.  Last Pain:  Vitals:   07/01/19 1345  PainSc: 0-No pain         Complications: No apparent anesthesia complications

## 2019-07-01 NOTE — Progress Notes (Signed)
Patient ID: Lee Hooper, male   DOB: 1970/11/04, 49 y.o.   MRN: [id number] BP 118/69   Pulse 76   Temp 98.4 F (36.9 C) (Axillary)   Resp 16   Ht 6' (1.829 m)   SpO2 100%  Perrl, locaLizing weakly with left upper extremity +cough, triggering ventilator Moving left lower extremity

## 2019-07-01 NOTE — ED Triage Notes (Signed)
Pt here from home found on the toilet  with right side weakness unable to talk , pt called a code stroke upon arrival

## 2019-07-01 NOTE — Progress Notes (Signed)
eLink Physician-Brief Progress Note Patient Name: Lee Hooper DOB: 01-01-1971 MRN: [id number]   Date of Service  07/01/2019  HPI/Events of Note  Last K+ from ABG = 6.7.  EKG - QRS not widened and T wave not peaked.   eICU Interventions  Will order: 1. BMP STAT.     Intervention Category Major Interventions: Electrolyte abnormality - evaluation and management  Dalina Samara Eugene 07/01/2019, 8:09 PM

## 2019-07-01 NOTE — Anesthesia Procedure Notes (Signed)
Arterial Line Insertion Start/End4/28/2021 4:15 PM, 07/01/2019 4:20 PM Performed by: Sheppard Evens, CRNA, CRNA  Patient location: OR. Preanesthetic checklist: patient identified, IV checked, site marked, risks and benefits discussed, surgical consent, monitors and equipment checked, pre-op evaluation and timeout performed Right, radial was placed Catheter size: 20 G Maximum sterile barriers used   Attempts: 1 Procedure performed without using ultrasound guided technique. Ultrasound Notes:anatomy identified Following insertion, dressing applied and Biopatch.

## 2019-07-01 NOTE — Progress Notes (Signed)
Pt considered for CHARM study- needs MRI to determine eligibility. Taken from CT to MRI with IR team. PACU nurse met patient in [place]. Report given from IR RN to PACU RN.

## 2019-07-01 NOTE — ED Provider Notes (Signed)
[facility] EMERGENCY DEPARTMENT Provider Note   CSN: [id number] Arrival date & time: 07/01/19  0746     History Chief Complaint  Patient presents with  . Code Stroke    Lee Hooper is a 49 y.o. male.  The history is provided by the EMS personnel. No language interpreter was used.   Lee Hooper is a 49 y.o. male who presents to the Emergency Department complaining of AMS.  Level V caveat due to AMS. History is provided by EMS. Per EMS report he was last known well at 2300, lives with a significant other. EMS was called out today for altered mental status. He was found sitting on the commode. He has no known medical problems.    History reviewed. No pertinent past medical history.  Patient Active Problem List   Diagnosis Date Noted  . Stroke (HCC) 07/01/2019  . 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 No pertinent surgical history.     History reviewed. No pertinent family history.  Social History   Tobacco Use  . Smoking status: Current Every Day Smoker  Substance Use Topics  . Alcohol use: Not on file  . Drug use: Not on file    Home Medications Prior to Admission medications   Not on File    Allergies    Patient has no allergy information on record.  Review of Systems   Review of Systems  Unable to perform ROS: Mental status change    Physical Exam Updated Vital Signs BP 118/69   Pulse 95   Temp (!) 97.2 F (36.2 C)   Resp (!) 23   Ht 6' (1.829 m)   SpO2 97%   Physical Exam Vitals and nursing note reviewed.  Constitutional:      General: He is in acute distress.     Appearance: He is well-developed. He is ill-appearing.     Comments: Drowsy  HENT:     Head: Normocephalic and atraumatic.  Cardiovascular:     Rate and Rhythm: Normal rate and regular rhythm.     Heart sounds: No murmur.  Pulmonary:     Effort: Pulmonary effort is normal. No respiratory distress.     Breath  sounds: Normal breath sounds.  Abdominal:     Palpations: Abdomen is soft.     Tenderness: There is no abdominal tenderness. There is no guarding or rebound.  Musculoskeletal:        General: No tenderness.  Skin:    General: Skin is warm and dry.     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 Pupils midsized and reactive. Mumbling and unintelligible speech. Right-sided hemiparesis. There is 4/5 strength and left upper and left lower extremities. Difficulty following commands.   Psychiatric:     Comments: Unable to assess     ED Results / Procedures / Treatments   Labs (all labs ordered are listed, but only abnormal results are displayed) Labs Reviewed  COMPREHENSIVE METABOLIC PANEL - Abnormal; Notable for the following components:      Result Value   Glucose, Bld 160 (*)    Calcium 8.6 (*)    Total Protein 5.8 (*)    Albumin 3.4 (*)    All other components within normal limits  BLOOD GAS, ARTERIAL - Abnormal; Notable for the following components:   pH, Arterial 7.291 (*)    pCO2  arterial 56.4 (*)    pO2, Arterial 310 (*)    Allens test (pass/fail) A-LINE (*)    All other components within normal limits  I-STAT CHEM 8, ED - Abnormal; Notable for the following components:   Glucose, Bld 151 (*)    Calcium, Ion 0.86 (*)    All other components within normal limits  CBG MONITORING, ED - Abnormal; Notable for the following components:   Glucose-Capillary 144 (*)    All other components within normal limits  RESPIRATORY PANEL BY RT PCR (FLU A&B, COVID)  ETHANOL  PROTIME-INR  APTT  CBC  DIFFERENTIAL  RAPID URINE DRUG SCREEN, HOSP PERFORMED  URINALYSIS, ROUTINE W REFLEX MICROSCOPIC  HIV ANTIBODY (ROUTINE TESTING W REFLEX)  SODIUM  SODIUM  SODIUM  HEMOGLOBIN A1C  LIPID PANEL  TYPE AND SCREEN  PREPARE FRESH FROZEN PLASMA  PREPARE RBC (CROSSMATCH)    EKG EKG  Interpretation  Date/Time:  Wednesday July 01 2019 07:57:44 EDT Ventricular Rate:  77 PR Interval:    QRS Duration: 109 QT Interval:  423 QTC Calculation: 479 R Axis:   95 Text Interpretation: Sinus rhythm Borderline right axis deviation Borderline prolonged QT interval no prior available for comparison Confirmed by Tilden Fossa [id number]) on 07/01/2019 8:37:45 AM   Radiology CT Angio Head W/Cm &/Or Wo Cm  Result Date: 07/01/2019 CLINICAL DATA:  Stroke.  Right-sided weakness and aphasia. EXAM: CT ANGIOGRAPHY HEAD AND NECK CT PERFUSION BRAIN TECHNIQUE: Multidetector CT imaging of the head and neck was performed using the standard protocol during bolus administration of intravenous contrast. Multiplanar CT image reconstructions and MIPs were obtained to evaluate the vascular anatomy. Carotid stenosis measurements (when applicable) are obtained utilizing NASCET criteria, using the distal internal carotid diameter as the denominator. Multiphase CT imaging of the brain was performed following IV bolus contrast injection. Subsequent parametric perfusion maps were calculated using RAPID software. CONTRAST:  OMNIPAQUE IOHEXOL 350 MG/ML SOLN COMPARISON:  CT head earlier today FINDINGS: CTA NECK FINDINGS Aortic arch: Standard branching. Imaged portion shows no evidence of aneurysm or dissection. No significant stenosis of the major arch vessel origins. Right carotid system: Normal right carotid without stenosis or atherosclerotic disease Left carotid system: Left carotid bifurcation patent without atherosclerotic disease. There is tapering of the left internal carotid artery proximally with occlusion in the mid neck. There is no atherosclerotic calcification. This is likely due to dissection. Vertebral arteries: Both vertebral arteries widely patent. Skeleton: No acute skeletal abnormality. Other neck: 12 mm left thyroid nodule. No mass or adenopathy in the neck. Upper chest: Mild apical emphysema without  acute abnormality. Review of the MIP images confirms the above findings CTA HEAD FINDINGS Anterior circulation: Left internal carotid artery is occluded from the mid neck through the terminus. There is occlusion of the left middle cerebral arteries involving the left M1 M2 and M3 branches. There is poor collateral circulation. Right internal carotid artery widely patent. Right anterior and middle cerebral arteries patent. Left anterior cerebral artery is patent supplied from the right. Posterior circulation: Both vertebral arteries patent to the basilar. PICA patent bilaterally. Superior cerebellar and posterior cerebral arteries patent without stenosis. Venous sinuses: Superior sagittal sinus and right transverse sinus widely patent. Limited opacification of left transverse sinus likely due to arterial occlusion on the left. Anatomic variants: None Review of the MIP images confirms the above findings CT Brain Perfusion Findings: ASPECTS: Original report is 10. However in light of the CT perfusion and in further review of the CT, there  are subtle low-density changes in the left hemisphere compatible with acute infarct. Updated aspects is 6 or 7. CBF (<30%) Volume: Perfusion (Tmax>6.0s) volume: Mismatch Volume: 67mL Infarction Location:Left hemispheric infarct involving the left frontotemporal and parietal lobe. IMPRESSION: 1. 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 The patient has minimal atherosclerotic disease. The left internal carotid artery is occluded through the terminus and extending in the left M1 M2 and M3 branches. There is poor collateral circulation on the left. There is a large territory infarct involving the left hemisphere. Infarct volume 266 mL. Electronically Signed   By: Marlan Palau M.D.   On: 07/01/2019 08:36   DG Chest 1 View  Result Date: 07/01/2019 CLINICAL DATA:  Encounter for imaging to screen for metal prior to MRI.  EXAM: CHEST  1 VIEW COMPARISON:  None. FINDINGS: There are no metallic foreign objects in the chest. Heart size and vascularity are normal. Lungs are clear. Bones are normal. IMPRESSION: Normal chest. Specifically, no evidence of metallic objects in the chest. Electronically Signed   By: Francene Boyers M.D.   On: 07/01/2019 09:13   DG Pelvis 1-2 Views  Result Date: 07/01/2019 CLINICAL DATA:  Encounter for imaging to screen for metal prior to MRI. EXAM: PELVIS - 1-2 VIEW COMPARISON:  None. FINDINGS: There are no metallic objects in the pelvis. Bones are normal. Contrast in the left ureter and bladder. IMPRESSION: Normal exam. No metallic objects in the pelvis. Electronically Signed   By: Francene Boyers M.D.   On: 07/01/2019 09:15   DG Abd 1 View  Result Date: 07/01/2019 CLINICAL DATA:  Encounter for imaging to screen for metal prior to MRI. EXAM: ABDOMEN - 1 VIEW COMPARISON:  None. FINDINGS: There are no metallic objects in the abdomen or pelvis. The bowel gas pattern is normal. Contrast is seen in the renal collecting systems and bladder. Bones are normal. IMPRESSION: Normal exam.  No metallic objects in the abdomen. Electronically Signed   By: Francene Boyers M.D.   On: 07/01/2019 09:14   CT HEAD WO CONTRAST  Result Date: 07/01/2019 CLINICAL DATA:  Stroke, follow-up. EXAM: CT HEAD WITHOUT CONTRAST TECHNIQUE: Contiguous axial images were obtained from the base of the skull through the vertex without intravenous contrast. COMPARISON:  Brain MRI 07/01/2019, noncontrast head CT, CT angiogram head/neck and CT perfusion head performed earlier the same day 07/01/2019 FINDINGS: Brain: Residual circulating contrast material. There is irregular hyperdensity centered within the left basal ganglia, left subinsular region and inferomedial left temporal lobe measuring 3.0 x 2.6 x 3.9 cm (AP x TV x CC) likely reflecting a combination of parenchymal hemorrhage and contrast staining. There is scattered small volume  subarachnoid hemorrhage along the left cerebral hemisphere most notably within the left MCA cistern, left sylvian fissure and along the left frontoparietal convexity. There is edema with loss of gray-white differentiation within the left basal ganglia, left insula and anterior left temporal lobe, likely acute infarction. Subtle changes of acute infarction are also suspected within the paramedian anterior left frontal lobe ACA vascular territory. There is mass effect with effacement of the temporal horn of the left lateral ventricle no midline shift. No hydrocephalus. Vascular: Circulating contrast material limits evaluation for abnormal vessel hyperdensity. Skull: Normal. Negative for fracture or focal lesion. Sinuses/Orbits: Visualized orbits show no acute finding. Ethmoid sinus mucosal thickening. Small left sphenoid sinus mucous retention cyst. No significant mastoid effusion. Partially visualized life-support tubes. These results were called  by telephone at the time of interpretation on 07/01/2019 at 12:15 pm to provider Dr. Laurence Slate, who verbally acknowledged these results. IMPRESSION: 1. 3.0 x 2.6 x 3.9 cm region of hyperdensity centered within the left basal ganglia, left subinsular region and inferomedial left temporal lobe likely reflecting a combination of parenchymal hematoma and contrast staining. 2. Edema with loss of gray-white differentiation within the left basal ganglia, left insula and anterior left temporal lobe, likely acute infarction. Subtle changes of acute infarction are also suspected within the paramedian left frontal lobe ACA vascular territory. 3. Scattered small-volume subarachnoid hemorrhage along the left cerebral hemisphere. 4. Regional mass effect with effacement of the left lateral ventricle temporal horn. No midline shift. Electronically Signed   By: Jackey Loge DO   On: 07/01/2019 12:19   CT Angio Neck W and/or Wo Contrast  Result Date: 07/01/2019 CLINICAL DATA:  Stroke.   Right-sided weakness and aphasia. EXAM: CT ANGIOGRAPHY HEAD AND NECK CT PERFUSION BRAIN TECHNIQUE: Multidetector CT imaging of the head and neck was performed using the standard protocol during bolus administration of intravenous contrast. Multiplanar CT image reconstructions and MIPs were obtained to evaluate the vascular anatomy. Carotid stenosis measurements (when applicable) are obtained utilizing NASCET criteria, using the distal internal carotid diameter as the denominator. Multiphase CT imaging of the brain was performed following IV bolus contrast injection. Subsequent parametric perfusion maps were calculated using RAPID software. CONTRAST:  OMNIPAQUE IOHEXOL 350 MG/ML SOLN COMPARISON:  CT head earlier today FINDINGS: CTA NECK FINDINGS Aortic arch: Standard branching. Imaged portion shows no evidence of aneurysm or dissection. No significant stenosis of the major arch vessel origins. Right carotid system: Normal right carotid without stenosis or atherosclerotic disease Left carotid system: Left carotid bifurcation patent without atherosclerotic disease. There is tapering of the left internal carotid artery proximally with occlusion in the mid neck. There is no atherosclerotic calcification. This is likely due to dissection. Vertebral arteries: Both vertebral arteries widely patent. Skeleton: No acute skeletal abnormality. Other neck: 12 mm left thyroid nodule. No mass or adenopathy in the neck. Upper chest: Mild apical emphysema without acute abnormality. Review of the MIP images confirms the above findings CTA HEAD FINDINGS Anterior circulation: Left internal carotid artery is occluded from the mid neck through the terminus. There is occlusion of the left middle cerebral arteries involving the left M1 M2 and M3 branches. There is poor collateral circulation. Right internal carotid artery widely patent. Right anterior and middle cerebral arteries patent. Left anterior cerebral artery is patent  supplied from the right. Posterior circulation: Both vertebral arteries patent to the basilar. PICA patent bilaterally. Superior cerebellar and posterior cerebral arteries patent without stenosis. Venous sinuses: Superior sagittal sinus and right transverse sinus widely patent. Limited opacification of left transverse sinus likely due to arterial occlusion on the left. Anatomic variants: None Review of the MIP images confirms the above findings CT Brain Perfusion Findings: ASPECTS: Original report is 10. However in light of the CT perfusion and in further review of the CT, there are subtle low-density changes in the left hemisphere compatible with acute infarct. Updated aspects is 6 or 7. CBF (<30%) Volume: Perfusion (Tmax>6.0s) volume: Mismatch Volume: 47mL Infarction Location:Left hemispheric infarct involving the left frontotemporal and parietal lobe. IMPRESSION: 1. 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 The patient has minimal atherosclerotic disease. The left internal carotid artery is occluded through the terminus and extending in the left M1 M2 and  M3 branches. There is poor collateral circulation on the left. There is a large territory infarct involving the left hemisphere. Infarct volume 266 mL. Electronically Signed   By: Marlan Palau M.D.   On: 07/01/2019 08:36   MR ANGIO HEAD WO CONTRAST  Result Date: 07/01/2019 CLINICAL DATA:  Stroke, follow-up.  Status post intervention. EXAM: MRI HEAD WITHOUT CONTRAST MRA HEAD WITHOUT CONTRAST TECHNIQUE: Multiplanar, multiecho pulse sequences of the brain and surrounding structures were obtained without intravenous contrast. Angiographic images of the head were obtained using MRA technique without contrast. COMPARISON:  Noncontrast head CT performed earlier the same day at 11:50 a.m, brain MRI performed earlier the same day at 1:04 a.m., CTA head/neck and CT perfusion head performed earlier the  same day 07/01/2019 FINDINGS: MRI HEAD FINDINGS Brain: A limited protocol brain MRI was performed at the request of the ordering clinician. Only axial and coronal diffusion-weighted imaging, an axial T2* sequence and an axial T2/FLAIR sequence were obtained. There is a large region of cortical/subcortical restricted diffusion consistent with acute ischemia within the left cerebral hemisphere throughout the majority of the left MCA and ACA vascular territories. There is involvement of the left frontal, parietal and temporal lobes as well as mild involvement of the lateral left occipital lobe. Restricted diffusion is also present throughout the majority of the left basal ganglia and within the left internal capsule. The acute parenchymal hemorrhage and/or contrast staining centered within the left basal ganglia and inferomedial left temporal lobe on prior CT does not appear significantly changed by MR modality. Based on the MR appearance, it is suspected that a significant proportion of the hyperdensity demonstrated on prior CT reflects contrast staining. Scattered sulcal FLAIR hyperintensity along the left cerebral hemisphere consistent with previously demonstrated small volume acute subarachnoid hemorrhage. Small volume subarachnoid hemorrhage is also questioned along the posterior right cerebral hemisphere. No significant mass effect on the current exam.  No midline shift. Vascular: Reported below Skull and upper cervical spine: No focal marrow lesion is identified on the acquired sequences. Sinuses/Orbits: No evidence of acute orbital abnormality on the acquired sequences. Redemonstrated paranasal sinus mucosal thickening greatest within the bilateral ethmoid air cells. MRA HEAD FINDINGS Some flow related signal is identified within the proximal left internal carotid artery. However, there is no significant flow related signal arising from the more distal left ICA siphon or supraclinoid segment. Findings likely  reflect interval reocclusion of the intracranial left ICA. The M1 left MCA and more distal left MCA branch vessels demonstrate no flow related signal. Only minimal irregular flow related signal is seen within the A1 left ACA and proximal A2 left ACA. More distally, the left ACA is poorly delineated. The intracranial right internal carotid artery is patent without significant stenosis. The M1 middle cerebral artery is patent without significant stenosis. No right M2 proximal branch occlusion is identified. The right anterior cerebral artery is patent without significant stenosis. The visualized intracranial vertebral arteries are patent without significant stenosis, as is the basilar artery. The bilateral posterior cerebral arteries are patent without significant proximal stenosis. No intracranial aneurysm is identified. These results were called by telephone at the time of interpretation on 07/01/2019 at 12:55 pm to provider Arther Dames , who verbally acknowledged these results. IMPRESSION: MRI brain: 1. Restricted diffusion throughout the majority of the left MCA and ACA vascular territories consistent with acute ischemia. 2. No significant mass effect at this time. No midline shift. 3. The acute parenchymal hemorrhage and/or contrast staining centered within left basal  ganglia and inferomedial left temporal lobe on prior head CT does not appear significantly changed. Based on the MR appearance, it is suspected that a significant component of this previously demonstrated hyperdensity reflects contrast staining. 4. Redemonstrated small volume subarachnoid hemorrhage overlying the left cerebral hemisphere. Small volume subarachnoid hemorrhage is also questioned along the right cerebral hemisphere posteriorly. MRA head: Findings consistent with reocclusion of the intracranial left internal carotid artery. No flow related signal is seen within the left middle cerebral artery. There is only minimal, irregular flow  related signal within the A1 and proximal A2 left ACA with the left ACA likely functionally occluded. Electronically Signed   By: Kellie Simmering DO   On: 07/01/2019 13:37   MR BRAIN WO CONTRAST  Result Date: 07/01/2019 CLINICAL DATA:  Stroke, follow-up.  Status post intervention. EXAM: MRI HEAD WITHOUT CONTRAST MRA HEAD WITHOUT CONTRAST TECHNIQUE: Multiplanar, multiecho pulse sequences of the brain and surrounding structures were obtained without intravenous contrast. Angiographic images of the head were obtained using MRA technique without contrast. COMPARISON:  Noncontrast head CT performed earlier the same day at 11:50 a.m, brain MRI performed earlier the same day at 1:04 a.m., CTA head/neck and CT perfusion head performed earlier the same day 07/01/2019 FINDINGS: MRI HEAD FINDINGS Brain: A limited protocol brain MRI was performed at the request of the ordering clinician. Only axial and coronal diffusion-weighted imaging, an axial T2* sequence and an axial T2/FLAIR sequence were obtained. There is a large region of cortical/subcortical restricted diffusion consistent with acute ischemia within the left cerebral hemisphere throughout the majority of the left MCA and ACA vascular territories. There is involvement of the left frontal, parietal and temporal lobes as well as mild involvement of the lateral left occipital lobe. Restricted diffusion is also present throughout the majority of the left basal ganglia and within the left internal capsule. The acute parenchymal hemorrhage and/or contrast staining centered within the left basal ganglia and inferomedial left temporal lobe on prior CT does not appear significantly changed by MR modality. Based on the MR appearance, it is suspected that a significant proportion of the hyperdensity demonstrated on prior CT reflects contrast staining. Scattered sulcal FLAIR hyperintensity along the left cerebral hemisphere consistent with previously demonstrated small volume  acute subarachnoid hemorrhage. Small volume subarachnoid hemorrhage is also questioned along the posterior right cerebral hemisphere. No significant mass effect on the current exam.  No midline shift. Vascular: Reported below Skull and upper cervical spine: No focal marrow lesion is identified on the acquired sequences. Sinuses/Orbits: No evidence of acute orbital abnormality on the acquired sequences. Redemonstrated paranasal sinus mucosal thickening greatest within the bilateral ethmoid air cells. MRA HEAD FINDINGS Some flow related signal is identified within the proximal left internal carotid artery. However, there is no significant flow related signal arising from the more distal left ICA siphon or supraclinoid segment. Findings likely reflect interval reocclusion of the intracranial left ICA. The M1 left MCA and more distal left MCA branch vessels demonstrate no flow related signal. Only minimal irregular flow related signal is seen within the A1 left ACA and proximal A2 left ACA. More distally, the left ACA is poorly delineated. The intracranial right internal carotid artery is patent without significant stenosis. The M1 middle cerebral artery is patent without significant stenosis. No right M2 proximal branch occlusion is identified. The right anterior cerebral artery is patent without significant stenosis. The visualized intracranial vertebral arteries are patent without significant stenosis, as is the basilar artery. The bilateral posterior cerebral  arteries are patent without significant proximal stenosis. No intracranial aneurysm is identified. These results were called by telephone at the time of interpretation on 07/01/2019 at 12:55 pm to provider Arther Dames , who verbally acknowledged these results. IMPRESSION: MRI brain: 1. Restricted diffusion throughout the majority of the left MCA and ACA vascular territories consistent with acute ischemia. 2. No significant mass effect at this time. No midline  shift. 3. The acute parenchymal hemorrhage and/or contrast staining centered within left basal ganglia and inferomedial left temporal lobe on prior head CT does not appear significantly changed. Based on the MR appearance, it is suspected that a significant component of this previously demonstrated hyperdensity reflects contrast staining. 4. Redemonstrated small volume subarachnoid hemorrhage overlying the left cerebral hemisphere. Small volume subarachnoid hemorrhage is also questioned along the right cerebral hemisphere posteriorly. MRA head: Findings consistent with reocclusion of the intracranial left internal carotid artery. No flow related signal is seen within the left middle cerebral artery. There is only minimal, irregular flow related signal within the A1 and proximal A2 left ACA with the left ACA likely functionally occluded. Electronically Signed   By: Jackey Loge DO   On: 07/01/2019 13:37   MR BRAIN WO CONTRAST  Addendum Date: 07/01/2019   ADDENDUM REPORT: 07/01/2019 09:33 ADDENDUM: These results were called by telephone at the time of interpretation on 07/01/2019 at 9:33 am to provider Arther Dames , who verbally acknowledged these results. Electronically Signed   By: Jackey Loge DO   On: 07/01/2019 09:33   Result Date: 07/01/2019 CLINICAL DATA:  Stroke/TIA, assess extracranial arteries. Code stroke. EXAM: MRI HEAD WITHOUT CONTRAST TECHNIQUE: Multiplanar, multiecho pulse sequences of the brain and surrounding structures were obtained without intravenous contrast. COMPARISON:  CT angiogram head/neck and non-contrast head CT performed earlier the same day 07/01/2019 FINDINGS: Brain: A limited protocol brain MRI was performed at the ordering provider's request. Only axial diffusion-weighted imaging and axial T2 FLAIR sequence were obtained. The axial diffusion-weighted sequence is severely motion degraded. Moderate motion degradation of the axial T2 FLAIR sequence. There is restricted diffusion  within the left cerebral hemisphere and left basal ganglia throughout much of the left MCA vascular territory consistent with acute ischemia. Restricted diffusion is also present within the paramedian left frontoparietal lobes within the left ACA vascular territory consistent with acute ischemia, although this is less well assessed due to the degree of motion degradation at the level of the vertex. There is little if any corresponding T2/FLAIR hyperintensity at these sites. No significant mass effect on the current exam.  No midline shift. Vascular: Poorly assessed on the acquired sequences. Skull and upper cervical spine: No focal calvarial lesion is identified on the acquired sequences. Sinuses/Orbits: No acute orbital abnormality is identified on the acquired sequences. Mild ethmoid sinus mucosal thickening. Left sphenoid sinus mucous retention cyst. IMPRESSION: Severely motion degraded diffusion-weighted imaging. Restricted diffusion throughout much of the left MCA vascular territory consistent with acute ischemia. Restricted diffusion consistent with acute ischemia is also present within the paramedian left frontoparietal lobes, although this is less well assessed due to the degree of motion degradation at the level of the vertex. There is little if any corresponding T2/FLAIR hyperintensity at these sites. No significant mass effect.  No midline shift. Electronically Signed: By: Jackey Loge DO On: 07/01/2019 09:23   CT CEREBRAL PERFUSION W CONTRAST  Result Date: 07/01/2019 CLINICAL DATA:  Stroke.  Right-sided weakness and aphasia. EXAM: CT ANGIOGRAPHY HEAD AND NECK CT PERFUSION BRAIN TECHNIQUE: Multidetector  CT imaging of the head and neck was performed using the standard protocol during bolus administration of intravenous contrast. Multiplanar CT image reconstructions and MIPs were obtained to evaluate the vascular anatomy. Carotid stenosis measurements (when applicable) are obtained utilizing NASCET  criteria, using the distal internal carotid diameter as the denominator. Multiphase CT imaging of the brain was performed following IV bolus contrast injection. Subsequent parametric perfusion maps were calculated using RAPID software. CONTRAST:  OMNIPAQUE IOHEXOL 350 MG/ML SOLN COMPARISON:  CT head earlier today FINDINGS: CTA NECK FINDINGS Aortic arch: Standard branching. Imaged portion shows no evidence of aneurysm or dissection. No significant stenosis of the major arch vessel origins. Right carotid system: Normal right carotid without stenosis or atherosclerotic disease Left carotid system: Left carotid bifurcation patent without atherosclerotic disease. There is tapering of the left internal carotid artery proximally with occlusion in the mid neck. There is no atherosclerotic calcification. This is likely due to dissection. Vertebral arteries: Both vertebral arteries widely patent. Skeleton: No acute skeletal abnormality. Other neck: 12 mm left thyroid nodule. No mass or adenopathy in the neck. Upper chest: Mild apical emphysema without acute abnormality. Review of the MIP images confirms the above findings CTA HEAD FINDINGS Anterior circulation: Left internal carotid artery is occluded from the mid neck through the terminus. There is occlusion of the left middle cerebral arteries involving the left M1 M2 and M3 branches. There is poor collateral circulation. Right internal carotid artery widely patent. Right anterior and middle cerebral arteries patent. Left anterior cerebral artery is patent supplied from the right. Posterior circulation: Both vertebral arteries patent to the basilar. PICA patent bilaterally. Superior cerebellar and posterior cerebral arteries patent without stenosis. Venous sinuses: Superior sagittal sinus and right transverse sinus widely patent. Limited opacification of left transverse sinus likely due to arterial occlusion on the left. Anatomic variants: None Review of the MIP  images confirms the above findings CT Brain Perfusion Findings: ASPECTS: Original report is 10. However in light of the CT perfusion and in further review of the CT, there are subtle low-density changes in the left hemisphere compatible with acute infarct. Updated aspects is 6 or 7. CBF (<30%) Volume: Perfusion (Tmax>6.0s) volume: Mismatch Volume: 47mL Infarction Location:Left hemispheric infarct involving the left frontotemporal and parietal lobe. IMPRESSION: 1. 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 The patient has minimal atherosclerotic disease. The left internal carotid artery is occluded through the terminus and extending in the left M1 M2 and M3 branches. There is poor collateral circulation on the left. There is a large territory infarct involving the left hemisphere. Infarct volume 266 mL. Electronically Signed   By: Marlan Palau M.D.   On: 07/01/2019 08:36   DG Chest Port 1 View  Result Date: 07/01/2019 CLINICAL DATA:  Code stroke.  Respiratory failure. EXAM: PORTABLE CHEST 1 VIEW COMPARISON:  07/01/2019 FINDINGS: An endotracheal tube is identified with tip 4.5 cm above the carina. The cardiomediastinal silhouette is unremarkable. Mild pulmonary vascular congestion is present. There is no evidence of focal airspace disease, pulmonary edema, suspicious pulmonary nodule/mass, pleural effusion, or pneumothorax. No acute bony abnormalities are identified. IMPRESSION: Mild pulmonary vascular congestion. Endotracheal tube with tip 4.5 cm above the carina. Electronically Signed   By: Harmon Pier M.D.   On: 07/01/2019 14:28   CT HEAD CODE STROKE WO CONTRAST  Result Date: 07/01/2019 CLINICAL DATA:  Code stroke.  Right-sided weakness aphasia EXAM: CT HEAD WITHOUT CONTRAST TECHNIQUE: Contiguous  axial images were obtained from the base of the skull through the vertex without intravenous contrast. COMPARISON:  None. FINDINGS: Brain: No  evidence of acute infarction, hemorrhage, hydrocephalus, extra-axial collection or mass lesion/mass effect. Vascular: Hyperdense left MCA and terminal left ICA compatible with acute thrombus. Skull: Negative Sinuses/Orbits: Mild mucosal edema paranasal sinuses. Negative orbit Other: None ASPECTS (Alberta Stroke Program Early CT Score) - Ganglionic level infarction (caudate, lentiform nuclei, internal capsule, insula, M1-M3 cortex): 7 - Supraganglionic infarction (M4-M6 cortex): 3 Total score (0-10 with 10 being normal): 10 IMPRESSION: 1. Hyperdense left ICA and MCA compatible with acute thrombus. No acute infarct or hemorrhage. 2. ASPECTS is 10 3. Results texted to Dr. Laurence SlateAroor via Loretha StaplerAMION. Electronically Signed   By: Marlan Palauharles  Clark M.D.   On: 07/01/2019 08:17    Procedures Procedures (including critical care time) CRITICAL CARE Performed by: Tilden FossaElizabeth Lauralyn Shadowens   Total critical care time: 35 minutes  Critical care time was exclusive of separately billable procedures and treating other patients.  Critical care was necessary to treat or prevent imminent or life-threatening deterioration.  Critical care was time spent personally by me on the following activities: development of treatment plan with patient and/or surrogate as well as nursing, discussions with consultants, evaluation of patient's response to treatment, examination of patient, obtaining history from patient or surrogate, ordering and performing treatments and interventions, ordering and review of laboratory studies, ordering and review of radiographic studies, pulse oximetry and re-evaluation of patient's condition.  Medications Ordered in ED Medications  ticagrelor (BRILINTA) 90 MG tablet (has no administration in time range)  nitroGLYCERIN 100 mcg/mL intra-arterial injection (has no administration in time range)  nitroGLYCERIN 100 mcg/mL intra-arterial injection (has no administration in time range)  ceFAZolin (ANCEF) 2-4 GM/100ML-% IVPB  (has no administration in time range)   stroke: mapping our early stages of recovery book ( Does not apply MAR Hold 07/01/19 1523)  0.9 %  sodium chloride infusion (has no administration in time range)  acetaminophen (TYLENOL) tablet 650 mg ( Oral MAR Hold 07/01/19 1523)    Or  acetaminophen (TYLENOL) 160 MG/5ML solution 650 mg ( Per Tube MAR Hold 07/01/19 1523)    Or  acetaminophen (TYLENOL) suppository 650 mg ( Rectal MAR Hold 07/01/19 1523)  senna-docusate (Senokot-S) tablet 1 tablet ( Oral MAR Hold 07/01/19 1523)  clevidipine (CLEVIPREX) infusion 0.5 mg/mL (has no administration in time range)  phenylephrine (NEOSYNEPHRINE) 10-0.9 MG/250ML-% infusion ( Intravenous MAR Hold 07/01/19 1523)  sodium chloride (hypertonic) 3 % solution (75 mL/hr Intravenous New Bag/Given 07/01/19 1450)  pantoprazole (PROTONIX) injection 40 mg ( Intravenous Automatically Held 07/09/19 1600)  0.9 % irrigation (POUR BTL) (1,000 mLs Irrigation Given 07/01/19 1620)  bacitracin ointment (1 application Topical Given 07/01/19 1623)  Surgifoam 100 with Thrombin 20,000 units (20 ml) topical solution ( Topical Given 07/01/19 1720)  lidocaine-EPINEPHrine 0.5 %-1:200000 (with pres) injection (10 mLs Infiltration Given 07/01/19 1606)  Surgifoam 1 Gm with Thrombin 5,000 units (5 ml) topical solution ( Topical Given 07/01/19 1720)  hemostatic agents (1 application Topical Given 07/01/19 1700)  iohexol (OMNIPAQUE) 350 MG/ML injection 100 mL (100 mLs Intravenous Contrast Given 07/01/19 0821)  iohexol (OMNIPAQUE) 300 MG/ML solution 150 mL (75 mLs Intra-arterial Contrast Given 07/01/19 1111)  iohexol (OMNIPAQUE) 300 MG/ML solution 50 mL (5 mLs Intra-arterial Contrast Given 07/01/19 1112)  nitroGLYCERIN 1 mg/10 mL (100 mcg/mL) - IR/CATH LAB (25 mcg Intra-arterial Given 07/01/19 1057)  iohexol (OMNIPAQUE) 300 MG/ML solution 150 mL (25 mLs Intra-arterial Contrast Given 07/01/19 1149)  ED Course  I have reviewed the triage vital signs and the  nursing notes.  Pertinent labs & imaging results that were available during my care of the patient were reviewed by me and considered in my medical decision making (see chart for details).    MDM Rules/Calculators/A&P                     patient presents the emergency department for altered mental status. Patient is ill appearing on evaluation with right hemiparesis, unintelligible speech concern for acute CVA. A code stroke was activated on patient's ED arrival. Patient here for evaluation of altered mental status. Patient is ill appearing on evaluation with right hemiparesis, aphasia. A code stroke was activated on patient's ED arrival. CT scan demonstrates acute carotid occlusion. Patient transferred to interventional radiology for further treatment. Attempted to contact patient's significant other, with no answer. 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

## 2019-07-01 NOTE — Progress Notes (Signed)
eLink Physician-Brief Progress Note Patient Name: Lee Hooper DOB: 02-18-1971 MRN: [id number]   Date of Service  07/01/2019  HPI/Events of Note  Agitation - Nursing request for PRN sedation.  eICU Interventions  Will order: 1. Fentanyl 25-100 mcg IV Q 2 hours PRN agitation     Intervention Category Major Interventions: Delirium, psychosis, severe agitation - evaluation and management  Kacia Halley Eugene 07/01/2019, 8:05 PM

## 2019-07-01 NOTE — Anesthesia Postprocedure Evaluation (Signed)
Anesthesia Post Note  Patient: Lee Hooper  Procedure(s) Performed: Left Hemicraniectomy (Left Head)     Patient location during evaluation: ICU Anesthesia Type: General Level of consciousness: sedated and patient remains intubated per anesthesia plan Pain management: pain level controlled Vital Signs Assessment: post-procedure vital signs reviewed and stable Respiratory status: patient remains intubated per anesthesia plan Cardiovascular status: stable Postop Assessment: no apparent nausea or vomiting Anesthetic complications: no Comments: Prognosis grave, dropped off in ICU on phenylephrine infusion maintaining adequate MAPs    Last Vitals:  Vitals:   07/01/19 1500 07/01/19 1917  BP: 118/69   Pulse: 95 76  Resp: (!) 23 16  Temp:    SpO2: 97% 100%    Last Pain:  Vitals:   07/01/19 1345  PainSc: 0-No pain                 Beryle Lathe

## 2019-07-01 NOTE — Sedation Documentation (Signed)
Report given to PACU RN JJ in MRI [facility]

## 2019-07-01 NOTE — Consult Note (Signed)
NAME:  Lee Hooper, MRN:  [id number], DOB:  04-09-1970, LOS: 0 ADMISSION DATE:  07/01/2019, CONSULTATION DATE:  07/01/2019 REFERRING MD:  [name], CHIEF COMPLAINT:  CVA   Brief History   Lee Hooper is a 49 yo M who presented as a code stroke after being found aphasic and nonverbal by his wife. CT/CTA studies showed left ICA/MCA thrombosis. Patient was admitted by Neurology and went for IR atherectomy, which achieved revascularization. He was intubated for airway protection. PCCM consulted for vent management.  History of present illness   49 yo male with COPD and psoriasis who presented to [facility] on 07/01/19 for right hemiplegia and aphasia. LKN at 11pm on evening prior to admission.  Daughter is primary historian. Remainder of HPI obtained from chart review. She notes that she resides with the pt and that she did hear him get up this morning around 0650 however found him in the restroom with right hemiplegia and expressive aphasia.  On arrival to the ED, head imaging revealed large ICA/MCA thrombosis and pt was taken for IR thrombectomy for left ICA and left M1 occlusions. Repeat head CT was obtained following the procedure out of concern for possible hemorrhage and showed moderate basal ganglia hemorrhage vs stroke. Repeat MRI revealed that the ICA and MCA re-occluded with change in FLAIR sequence excluding him from repeat thrombectomy.  Neurosurgery was consulted for decompressive hemicraniectomy. Past Medical History  Psoriasis COPD Tobacco Use  Significant Hospital Events   4/28: Admit 4/28: L ICA/MCA Thrombectomy with re-occlusion>neurosurgery consulted for left hemicraniectomy   Consults:  IR PCCM Neuro (Primary) Neurosurgery  Procedures:  4/28 IR - L ICA/MCA Thrombectomy  Significant Diagnostic Tests:  Admit CT Head Code Stroke: IMPRESSION: 1. Hyperdense left ICA and MCA compatible with acute thrombus. No acute infarct or hemorrhage. 2. ASPECTS is  10  Admit CTA / CT Perfusion Head/Neck: IMPRESSION: 1. 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 The patient has minimal atherosclerotic disease. The left internal carotid artery is occluded through the terminus and extending in the left M1 M2 and M3 branches. There is poor collateral circulation on the left. There is a large territory infarct involving the left hemisphere. Infarct volume 266 mL.  Admit MR Brain: IMPRESSION: Severely motion degraded diffusion-weighted imaging.  Restricted diffusion throughout much of the left MCA vascular territory consistent with acute ischemia. Restricted diffusion consistent with acute ischemia is also present within the paramedian left frontoparietal lobes, although this is less well assessed due to the degree of motion degradation at the level of the vertex. There is little if any corresponding T2/FLAIR hyperintensity at these sites.  No significant mass effect.  No midline shift.  Post Thrombectomy CT Head: IMPRESSION: 1. 3.0 x 2.6 x 3.9 cm region of hyperdensity centered within the left basal ganglia, left subinsular region and inferomedial left temporal lobe likely reflecting a combination of parenchymal hematoma and contrast staining.  2. Edema with loss of gray-white differentiation within the left basal ganglia, left insula and anterior left temporal lobe, likely acute infarction. Subtle changes of acute infarction are also suspected within the paramedian left frontal lobe ACA vascular territory. 3. Scattered small-volume subarachnoid hemorrhage along the left cerebral hemisphere. 4. Regional mass effect with effacement of the left lateral ventricle temporal horn. No midline shift.  Post Thrombectomy MR Brain:  1. Restricted diffusion throughout the majority of the left MCA and ACA vascular territories consistent with acute ischemia. 2. No significant mass  effect at this time. No midline  shift. 3. The acute parenchymal hemorrhage and/or contrast staining centered within left basal ganglia and inferomedial left temporal lobe on prior head CT does not appear significantly changed. Based on the MR appearance, it is suspected that a significant component of this previously demonstrated hyperdensity reflects contrast staining. 4. Redemonstrated small volume subarachnoid hemorrhage overlying the left cerebral hemisphere. Small volume subarachnoid hemorrhage is also questioned along the right cerebral hemisphere posteriorly. Micro Data:  4/28 Negative Coronavirus PCR  Antimicrobials:  None   Interim history/subjective:  Intubated and sedated when seen.  Objective   Blood pressure 118/69, pulse 95, temperature (!) 97.2 F (36.2 C), resp. rate (!) 23, height 6' (1.829 m), SpO2 97 %.    Vent Mode: PRVC FiO2 (%):  [100 %] 100 % Set Rate:  [16 bmp] 16 bmp Vt Set:  [161 mL] 620 mL PEEP:  [5 cmH20] 5 cmH20 Plateau Pressure:  [23 cmH20] 23 cmH20   Intake/Output Summary (Last 24 hours) at 07/01/2019 1645 Last data filed at 07/01/2019 1548 Gross per 24 hour  Intake 800 ml  Output 425 ml  Net 375 ml     Examination: General: Intubated, Sedated HENT: ETT in place, Moist MM Lungs: CTAB Anteriorly Cardiovascular: RRR No r/m/g Abdomen: Obese abdomen, decreased bowl sounds Extremities: Warm, Dry, 2+ pitting edema to shin Neuro: Sedated, pinpoint pupils  Resolved Hospital Problem list   n/a  Assessment & Plan:   COPD Respiratory insufficiency: Intubated for airway protection at the time of thrombectomy.  - Continue MV - VAP Bundle -Goal pCO2 ~106mmHg  Left Sided CVA: p/w right sided symptoms, aphasia. Occluded left ICA/MCA.Underwent thrombectomy, initially was reperfused, but rethrombosed on repeat brain imaging. Changes in FLAIR images excluding repeat thrombectomy. No significant mass effect on post thrombectomy MRI  Neurosurg consulted and planning to take to OR tonight  for left hemicraniectomy.  BP below goal (in 110s) when seen in PACU).  - management per primary (neurology) - follow up neurosurgery recommendations - SBP goal :120-140 - hypertonic saline and hyperventilation for cerebral edema prophylaxis  Acute encephalopathy 2/2 sedation and CVA -Continue sedation with propofol. If pressures unable to be maintained within range, then would switch to fentanyl  -RASS goal 0 to -1 Best practice:  Diet: NPO Pain/Anxiety/Delirium protocol (if indicated): Propofol VAP protocol (if indicated): Yes DVT prophylaxis: SCDs GI prophylaxis: PPI Glucose control: n/a Mobility: BR Code Status: Full Family Communication: wife updated at bedside Disposition: ICU  Labs   CBC: Recent Labs  Lab 07/01/19 0759 07/01/19 0804  WBC 6.6  --   NEUTROABS 4.3  --   HGB 15.9 16.0  HCT 48.3 47.0  MCV 92.2  --   PLT 166  --     Basic Metabolic Panel: Recent Labs  Lab 07/01/19 0759 07/01/19 0804  NA 139 139  K 4.1 3.9  CL 108 105  CO2 25  --   GLUCOSE 160* 151*  BUN 7 7  CREATININE 1.07 1.00  CALCIUM 8.6*  --    GFR: CrCl cannot be calculated (Unknown ideal weight.). Recent Labs  Lab 07/01/19 0759  WBC 6.6    Liver Function Tests: Recent Labs  Lab 07/01/19 0759  AST 22  ALT 27  ALKPHOS 50  BILITOT 0.6  PROT 5.8*  ALBUMIN 3.4*   No results for input(s): LIPASE, AMYLASE in the last 168 hours. No results for input(s): AMMONIA in the last 168 hours.  ABG    Component Value Date/Time  PHART 7.291 (L) 07/01/2019 1355   PCO2ART 56.4 (H) 07/01/2019 1355   PO2ART 310 (H) 07/01/2019 1355   HCO3 26.3 07/01/2019 1355   TCO2 27 07/01/2019 0804   O2SAT 99.4 07/01/2019 1355     Coagulation Profile: Recent Labs  Lab 07/01/19 0759  INR 1.0    Cardiac Enzymes: No results for input(s): CKTOTAL, CKMB, CKMBINDEX, TROPONINI in the last 168 hours.  HbA1C: No results found for: HGBA1C  CBG: Recent Labs  Lab 07/01/19 0800  GLUCAP 144*     Review of Systems:   Unable to be obtained due to acute CVA  Past Medical History  He,  has no past medical history on file.   Surgical History   History reviewed. No pertinent surgical history.   Social History   reports that he has been smoking. He does not have any smokeless tobacco history on file.   Family History   His family history is not on file.   Allergies Not on File   Home Medications  Prior to Admission medications   Not on File     Elige Radon, MD  Internal Medicine Resident PGY1 Pager # [phone] 07/01/19 4:45 PM

## 2019-07-01 NOTE — Progress Notes (Signed)
CDS referral made. Referral number  #[id number] Cammy [employer]ure.

## 2019-07-01 NOTE — Progress Notes (Signed)
Stroke Response Nurse Documentation Code Documentation  Lee Hooper is a 49 y.o. male arriving to [facility] ED via [facility] on 07/01/19 with past medical hx of smoking and psoriasis. Code stroke was activated by ED. Patient from home where he was LKW at 06/2719 2300 and was found by family this morning on toilet with aphasia and right sided weakness.   Stroke team meet patient in ED. Patient to CT with team. NIHSS 21, see documentation for details and code stroke times. Patient with disoriented, not following commands, left gaze preference , right hemianopia, bilateral arm weakness, bilateral leg weakness, Global aphasia , dysarthria  and Visual  neglect on exam. The following imaging was completed: CT, CTA head and neck, CTP. Patient is not a candidate for tPA due to outside of window.   IR activated [id number]. Unable to reach family for consent, will be emergent consent. Taken to IR bay [phone]. Groined prepped and pluses marked. Dr. Laurence Slate and Dr. Corliss Skains discussed case and requested STAT MRI. Without knowledge of possible devices in patient, xray of chest, abd, and pelvis were needed. Patient taken to xray [phone] and then MRI 0850. Family located and consented to IR procedure. Patient taken back to IR bay 8 0905. Anesthesia arrived 0908. Bedside handoff with IR RN Lee Hooper.   Ferman Hamming Stroke Response RN

## 2019-07-01 NOTE — Anesthesia Preprocedure Evaluation (Signed)
Anesthesia Evaluation  Patient identified by MRN, date of birth, ID band Patient confused    Reviewed: Unable to perform ROS - Chart review onlyPreop documentation limited or incomplete due to emergent nature of procedure.  Airway        Dental   Pulmonary Current Smoker,    + rhonchi        Cardiovascular  Rhythm:Regular     Neuro/Psych CVA, Residual Symptoms    GI/Hepatic   Endo/Other  Elevated BG  Renal/GU      Musculoskeletal   Abdominal   Peds  Hematology   Anesthesia Other Findings   Reproductive/Obstetrics                             Anesthesia Physical Anesthesia Plan  ASA: IV  Anesthesia Plan: General   Post-op Pain Management:    Induction: Intravenous, Rapid sequence and Cricoid pressure planned  PONV Risk Score and Plan: 1 and Treatment may vary due to age or medical condition  Airway Management Planned: Oral ETT  Additional Equipment: Arterial line  Intra-op Plan:   Post-operative Plan: Possible Post-op intubation/ventilation  Informed Consent:     History available from chart only and Only emergency history available  Plan Discussed with: Surgeon and CRNA  Anesthesia Plan Comments:         Anesthesia Quick Evaluation

## 2019-07-01 NOTE — Procedures (Signed)
S/P Lt CCArteriogram followed by revascularization of occluded intracranial ICA and Lt MCA with  1 pass with 6 mm  40 mm solitaire retriever, 2 passes with 59mm x 87mm embotrap retriever and  1 pass with 51mm x 20 mm solitaire retriever achieving a  TICI 2C revascularization. Post procedure CT brain Lt basal ganglia contrast stain and mild hyper density in the sylvian fissure. No mass effect.  RT groin puncture hemostasis achieved with 58F angioseal closure device. Distal pulses palpable DP and PT bilaterally. Patient left intubated for airway protection.. S.DEveshwarMD

## 2019-07-02 ENCOUNTER — Inpatient Hospital Stay (HOSPITAL_COMMUNITY): Payer: Medicaid Other

## 2019-07-02 LAB — PREPARE FRESH FROZEN PLASMA
Unit division: 0
Unit division: 0
Unit division: 0
Unit division: 0
Unit division: 0
Unit division: 0
Unit division: 0
Unit division: 0
Unit division: 0
Unit division: 0
Unit division: 0
Unit division: 0
Unit division: 0
Unit division: 0

## 2019-07-02 LAB — BPAM FFP
Blood Product Expiration Date: 202104302359
Blood Product Expiration Date: 202104302359
Blood Product Expiration Date: 202104302359
Blood Product Expiration Date: 202105022359
Blood Product Expiration Date: 202105022359
Blood Product Expiration Date: 202105032359
Blood Product Expiration Date: 202105032359
Blood Product Expiration Date: 202105032359
Blood Product Expiration Date: 202105032359
Blood Product Expiration Date: 202105032359
Blood Product Expiration Date: 202105032359
Blood Product Expiration Date: 202105032359
Blood Product Expiration Date: 202105032359
Blood Product Expiration Date: 202105032359
Blood Product Expiration Date: 202105032359
Blood Product Expiration Date: 202105032359
Blood Product Expiration Date: 202105032359
Blood Product Expiration Date: 202105032359
Blood Product Expiration Date: 202105032359
Blood Product Expiration Date: 202105032359
Blood Product Expiration Date: 202105192359
Blood Product Expiration Date: 202105202359
Blood Product Expiration Date: 202105202359
Blood Product Expiration Date: 202105202359
Blood Product Expiration Date: 202105202359
ISSUE DATE / TIME: 202104281646
ISSUE DATE / TIME: 202104281646
ISSUE DATE / TIME: 202104281716
ISSUE DATE / TIME: 202104281716
ISSUE DATE / TIME: 202104281732
ISSUE DATE / TIME: 202104281732
ISSUE DATE / TIME: 202104281732
ISSUE DATE / TIME: 202104281732
ISSUE DATE / TIME: 202104281747
ISSUE DATE / TIME: 202104281747
ISSUE DATE / TIME: 202104281747
ISSUE DATE / TIME: 202104281747
ISSUE DATE / TIME: 202104281752
ISSUE DATE / TIME: 202104281802
ISSUE DATE / TIME: 202104281802
ISSUE DATE / TIME: 202104281802
ISSUE DATE / TIME: 202104281832
ISSUE DATE / TIME: 202104281838
ISSUE DATE / TIME: 202104281838
ISSUE DATE / TIME: 202104281838
ISSUE DATE / TIME: 202104281853
ISSUE DATE / TIME: 202104290915
ISSUE DATE / TIME: 202104290915
ISSUE DATE / TIME: 202104290915
ISSUE DATE / TIME: 202104290915
Unit Type and Rh: 1700
Unit Type and Rh: 1700
Unit Type and Rh: 600
Unit Type and Rh: 600
Unit Type and Rh: 600
Unit Type and Rh: 600
Unit Type and Rh: 6200
Unit Type and Rh: 6200
Unit Type and Rh: 6200
Unit Type and Rh: 6200
Unit Type and Rh: 6200
Unit Type and Rh: 6200
Unit Type and Rh: 6200
Unit Type and Rh: 7300
Unit Type and Rh: 7300
Unit Type and Rh: 7300
Unit Type and Rh: 7300
Unit Type and Rh: 7300
Unit Type and Rh: 7300
Unit Type and Rh: 7300
Unit Type and Rh: 7300
Unit Type and Rh: 7300
Unit Type and Rh: 7300
Unit Type and Rh: 7300
Unit Type and Rh: 7300

## 2019-07-02 LAB — POCT I-STAT 7, (LYTES, BLD GAS, ICA,H+H)
Acid-Base Excess: 0 mmol/L (ref 0.0–2.0)
Acid-base deficit: 3 mmol/L — ABNORMAL HIGH (ref 0.0–2.0)
Bicarbonate: 22.9 mmol/L (ref 20.0–28.0)
Bicarbonate: 25.4 mmol/L (ref 20.0–28.0)
Calcium, Ion: 1.29 mmol/L (ref 1.15–1.40)
Calcium, Ion: 1.35 mmol/L (ref 1.15–1.40)
HCT: 42 % (ref 39.0–52.0)
HCT: 37 % — ABNORMAL LOW (ref 39.0–52.0)
Hemoglobin: 14.3 g/dL (ref 13.0–17.0)
Hemoglobin: 12.6 g/dL — ABNORMAL LOW (ref 13.0–17.0)
O2 Saturation: 94 %
O2 Saturation: 97 %
Patient temperature: 98
Patient temperature: 99.5
Potassium: 4.1 mmol/L (ref 3.5–5.1)
Potassium: 3.6 mmol/L (ref 3.5–5.1)
Sodium: 149 mmol/L — ABNORMAL HIGH (ref 135–145)
Sodium: 157 mmol/L — ABNORMAL HIGH (ref 135–145)
TCO2: 24 mmol/L (ref 22–32)
TCO2: 27 mmol/L (ref 22–32)
pCO2 arterial: 40.8 mmHg (ref 32.0–48.0)
pCO2 arterial: 43.4 mmHg (ref 32.0–48.0)
pH, Arterial: 7.356 (ref 7.350–7.450)
pH, Arterial: 7.377 (ref 7.350–7.450)
pO2, Arterial: 73 mmHg — ABNORMAL LOW (ref 83.0–108.0)
pO2, Arterial: 91 mmHg (ref 83.0–108.0)

## 2019-07-02 LAB — CBC WITH DIFFERENTIAL/PLATELET
Abs Immature Granulocytes: 0.03 10*3/uL (ref 0.00–0.07)
Basophils Absolute: 0 10*3/uL (ref 0.0–0.1)
Basophils Relative: 0 %
Eosinophils Absolute: 0 10*3/uL (ref 0.0–0.5)
Eosinophils Relative: 0 %
HCT: 44 % (ref 39.0–52.0)
Hemoglobin: 14.9 g/dL (ref 13.0–17.0)
Immature Granulocytes: 0 %
Lymphocytes Relative: 6 %
Lymphs Abs: 0.7 10*3/uL (ref 0.7–4.0)
MCH: 28.5 pg (ref 26.0–34.0)
MCHC: 33.9 g/dL (ref 30.0–36.0)
MCV: 84.1 fL (ref 80.0–100.0)
Monocytes Absolute: 0.8 10*3/uL (ref 0.1–1.0)
Monocytes Relative: 7 %
Neutro Abs: 9.5 10*3/uL — ABNORMAL HIGH (ref 1.7–7.7)
Neutrophils Relative %: 87 %
Platelets: 51 10*3/uL — ABNORMAL LOW (ref 150–400)
RBC: 5.23 MIL/uL (ref 4.22–5.81)
RDW: 15 % (ref 11.5–15.5)
WBC: 11 10*3/uL — ABNORMAL HIGH (ref 4.0–10.5)
nRBC: 0 % (ref 0.0–0.2)

## 2019-07-02 LAB — COMPREHENSIVE METABOLIC PANEL
ALT: 34 U/L (ref 0–44)
AST: 28 U/L (ref 15–41)
Albumin: 3 g/dL — ABNORMAL LOW (ref 3.5–5.0)
Alkaline Phosphatase: 34 U/L — ABNORMAL LOW (ref 38–126)
Anion gap: 8 (ref 5–15)
BUN: 8 mg/dL (ref 6–20)
CO2: 23 mmol/L (ref 22–32)
Calcium: 8.4 mg/dL — ABNORMAL LOW (ref 8.9–10.3)
Chloride: 118 mmol/L — ABNORMAL HIGH (ref 98–111)
Creatinine, Ser: 0.97 mg/dL (ref 0.61–1.24)
GFR calc Af Amer: >60 mL/min (ref >60)
GFR calc non Af Amer: >60 mL/min (ref >60)
Glucose, Bld: 160 mg/dL — ABNORMAL HIGH (ref 70–99)
Potassium: 4.2 mmol/L (ref 3.5–5.1)
Sodium: 149 mmol/L — ABNORMAL HIGH (ref 135–145)
Total Bilirubin: 0.8 mg/dL (ref 0.3–1.2)
Total Protein: 5.1 g/dL — ABNORMAL LOW (ref 6.5–8.1)

## 2019-07-02 LAB — HEMOGLOBIN A1C
Hgb A1c MFr Bld: 5.5 % (ref 4.8–5.6)
Mean Plasma Glucose: 111.15 mg/dL

## 2019-07-02 LAB — BPAM CRYOPRECIPITATE
Blood Product Expiration Date: 202104282140
ISSUE DATE / TIME: 202104281805
Unit Type and Rh: 5100

## 2019-07-02 LAB — SODIUM
Sodium: 144 mmol/L (ref 135–145)
Sodium: 150 mmol/L — ABNORMAL HIGH (ref 135–145)
Sodium: 154 mmol/L — ABNORMAL HIGH (ref 135–145)

## 2019-07-02 LAB — URINALYSIS, ROUTINE W REFLEX MICROSCOPIC
Bacteria, UA: NONE SEEN
Bilirubin Urine: NEGATIVE
Glucose, UA: NEGATIVE mg/dL
Ketones, ur: NEGATIVE mg/dL
Leukocytes,Ua: NEGATIVE
Nitrite: NEGATIVE
Protein, ur: 30 mg/dL — AB
Specific Gravity, Urine: 1.028 (ref 1.005–1.030)
pH: 5 (ref 5.0–8.0)

## 2019-07-02 LAB — GLUCOSE, CAPILLARY
Glucose-Capillary: 156 mg/dL — ABNORMAL HIGH (ref 70–99)
Glucose-Capillary: 151 mg/dL — ABNORMAL HIGH (ref 70–99)
Glucose-Capillary: 169 mg/dL — ABNORMAL HIGH (ref 70–99)
Glucose-Capillary: 150 mg/dL — ABNORMAL HIGH (ref 70–99)
Glucose-Capillary: 145 mg/dL — ABNORMAL HIGH (ref 70–99)
Glucose-Capillary: 130 mg/dL — ABNORMAL HIGH (ref 70–99)

## 2019-07-02 LAB — MAGNESIUM
Magnesium: 1.8 mg/dL (ref 1.7–2.4)
Magnesium: 1.7 mg/dL (ref 1.7–2.4)

## 2019-07-02 LAB — PHOSPHORUS
Phosphorus: 2.3 mg/dL — ABNORMAL LOW (ref 2.5–4.6)
Phosphorus: 1.6 mg/dL — ABNORMAL LOW (ref 2.5–4.6)

## 2019-07-02 LAB — LIPID PANEL
Cholesterol: 92 mg/dL (ref 0–200)
HDL: 30 mg/dL — ABNORMAL LOW (ref >40)
LDL Cholesterol: 45 mg/dL (ref 0–99)
Total CHOL/HDL Ratio: 3.1 RATIO
Triglycerides: 86 mg/dL (ref <150)
VLDL: 17 mg/dL (ref 0–40)

## 2019-07-02 LAB — BLOOD PRODUCT ORDER (VERBAL) VERIFICATION

## 2019-07-02 LAB — PREPARE CRYOPRECIPITATE: Unit division: 0

## 2019-07-02 LAB — MASSIVE TRANSFUSION PROTOCOL ORDER (BLOOD BANK NOTIFICATION)

## 2019-07-02 MED ORDER — SENNOSIDES-DOCUSATE SODIUM 8.6-50 MG PO TABS
1.0000 | ORAL_TABLET | Freq: Every evening | ORAL | Status: DC | PRN
  Administered 2019-07-04: 1
  Filled 2019-07-02: qty 1

## 2019-07-02 MED ORDER — VITAL HIGH PROTEIN PO LIQD
1000.0000 mL | ORAL | Status: DC
  Administered 2019-07-02 – 2019-07-04 (×2): 1000 mL

## 2019-07-02 MED ORDER — VITAL HIGH PROTEIN PO LIQD: 1000.0000 mL | ORAL | Status: DC

## 2019-07-02 MED ORDER — PANTOPRAZOLE SODIUM 40 MG PO PACK
40.0000 mg | PACK | Freq: Every day | ORAL | Status: DC
  Administered 2019-07-02 – 2019-08-28 (×56): 40 mg
  Filled 2019-07-02 (×60): qty 20

## 2019-07-02 MED ORDER — DEXMEDETOMIDINE HCL IN NACL 400 MCG/100ML IV SOLN
0.4000 ug/kg/h | INTRAVENOUS | Status: DC
  Administered 2019-07-02 – 2019-07-04 (×4): 0.5 ug/kg/h via INTRAVENOUS
  Administered 2019-07-04: 0.6 ug/kg/h via INTRAVENOUS
  Filled 2019-07-02 (×5): qty 100

## 2019-07-02 MED ORDER — SODIUM CHLORIDE 3 % IV BOLUS
250.0000 mL | Freq: Once | INTRAVENOUS | Status: AC
  Administered 2019-07-02: 12:00:00 250 mL via INTRAVENOUS
  Filled 2019-07-02: qty 250

## 2019-07-02 MED ORDER — PRO-STAT SUGAR FREE PO LIQD
60.0000 mL | Freq: Four times a day (QID) | ORAL | Status: DC
  Administered 2019-07-02 – 2019-07-06 (×14): 60 mL
  Filled 2019-07-02 (×15): qty 60

## 2019-07-02 MED ORDER — MIDAZOLAM HCL 2 MG/2ML IJ SOLN
2.0000 mg | Freq: Once | INTRAMUSCULAR | Status: AC
  Administered 2019-07-02: 10:00:00 2 mg via INTRAVENOUS
  Filled 2019-07-02: qty 2

## 2019-07-02 MED ORDER — INSULIN ASPART 100 UNIT/ML ~~LOC~~ SOLN
3.0000 [IU] | SUBCUTANEOUS | Status: DC
  Administered 2019-07-02 – 2019-07-04 (×10): 3 [IU] via SUBCUTANEOUS

## 2019-07-02 MED ORDER — PROMETHAZINE HCL 25 MG PO TABS: 12.5000 mg | ORAL_TABLET | ORAL | Status: DC | PRN

## 2019-07-02 MED ORDER — PRO-STAT SUGAR FREE PO LIQD
30.0000 mL | Freq: Two times a day (BID) | ORAL | Status: DC
  Filled 2019-07-02: qty 30

## 2019-07-02 MED ORDER — ADULT MULTIVITAMIN W/MINERALS CH
1.0000 | ORAL_TABLET | Freq: Every day | ORAL | Status: AC
  Administered 2019-07-02 – 2019-07-04 (×3): 1
  Filled 2019-07-02 (×3): qty 1

## 2019-07-02 MED ORDER — FENTANYL CITRATE (PF) 100 MCG/2ML IJ SOLN
25.0000 ug | INTRAMUSCULAR | Status: DC | PRN
  Administered 2019-07-02 – 2019-07-06 (×16): 100 ug via INTRAVENOUS
  Administered 2019-07-07: 50 ug via INTRAVENOUS
  Administered 2019-07-07 – 2019-07-08 (×2): 100 ug via INTRAVENOUS
  Administered 2019-07-10 – 2019-07-13 (×2): 50 ug via INTRAVENOUS
  Administered 2019-07-14 – 2019-07-20 (×5): 100 ug via INTRAVENOUS
  Administered 2019-07-23 – 2019-08-03 (×2): 25 ug via INTRAVENOUS
  Administered 2019-08-08: 50 ug via INTRAVENOUS
  Administered 2019-08-17: 25 ug via INTRAVENOUS
  Filled 2019-07-02 (×30): qty 2

## 2019-07-02 MED ORDER — CLEVIDIPINE BUTYRATE 0.5 MG/ML IV EMUL
0.0000 mg/h | INTRAVENOUS | Status: AC
  Administered 2019-07-02: 11:00:00 21 mg/h via INTRAVENOUS
  Administered 2019-07-02: 14:00:00 16 mg/h via INTRAVENOUS
  Filled 2019-07-02: qty 100

## 2019-07-02 MED FILL — Thrombin For Soln 5000 Unit: CUTANEOUS | Qty: 5000 | Status: AC

## 2019-07-02 MED FILL — Thrombin For Soln 20000 Unit: CUTANEOUS | Qty: 1 | Status: AC

## 2019-07-02 NOTE — Progress Notes (Signed)
Paged MD Georgiana Spinner Aroor about right arterial line being positional, no longer accurately reading. Verbal order received by this RN to treat BP by cuff and discontinue arterial line.

## 2019-07-02 NOTE — Progress Notes (Signed)
Initial Nutrition Assessment  DOCUMENTATION CODES:   Obesity unspecified  INTERVENTION:   Initiate Vital High Protein @ 25 ml/hr (600 ml/day) via OG tube 60 ml Prostat QID MVI daily  Provides: 1400 kcal, 172 grams protein, and 501 ml free water.  TF regimen and cleviprex at current rate providing 3416 total kcal/day    NUTRITION DIAGNOSIS:   Inadequate oral intake related to inability to eat as evidenced by NPO status.  GOAL:   Patient will meet greater than or equal to 90% of their needs  MONITOR:   TF tolerance, I & O's  REASON FOR ASSESSMENT:   Consult, Ventilator Enteral/tube feeding initiation and management  ASSESSMENT:   Pt with PMH of smoking and COPD admitted with L ICA/MCA s/p IR for revascularization however pt had re-occlusion now s/p emergent L hemicraniectomy.   Patient is currently intubated on ventilator support MV: 12.5 L/min Temp (24hrs), Avg:98.5 F (36.9 C), Min:98 F (36.7 C), Max:99.1 F (37.3 C)  Cleviprex @ 42 ml/hr provides: 2016 kcal  Medications reviewed and include: 3 units novolog every 4 hours Hypertonic saline   Labs reviewed: PO4: 2.3 (L) 16 F OG tube placed; per xray tip gastric after being inserted further after first placement  NUTRITION - FOCUSED PHYSICAL EXAM:  Deferred  Diet Order:   Diet Order            Diet NPO time specified  Diet effective now              EDUCATION NEEDS:   No education needs have been identified at this time  Skin:  Skin Assessment: Reviewed RN Assessment  Last BM:  unknown  Height:   Ht Readings from Last 1 Encounters:  07/02/19 6' (1.829 m)    Weight:   Wt Readings from Last 1 Encounters:  07/02/19 114.8 kg    Ideal Body Weight:  80.9 kg  BMI:  Body mass index is 34.32 kg/m.  Estimated Nutritional Needs:   Kcal:  1400-1600  Protein:  >161 grams  Fluid:  >1.5 L/day  Cammy Copa., RD, LDN, CNSC See AMiON for contact information

## 2019-07-02 NOTE — Plan of Care (Signed)
  Problem: Nutrition: Goal: Dietary intake will improve Outcome: Progressing   Problem: Coping: Goal: Will verbalize positive feelings about self Outcome: Not Progressing Goal: Will identify appropriate support needs Outcome: Not Progressing

## 2019-07-02 NOTE — Progress Notes (Signed)
SLP Cancellation Note  Patient Details Name: Lee Hooper MRN: [id number] DOB: 1970/12/11   Cancelled treatment:       Reason Eval/Treat Not Completed: Medical issues which prohibited therapy; pt on vent this am. Will follow up as able.    Mahala Menghini., M.A. CCC-SLP Acute Rehabilitation Services Pager [phone] Office [phone]  07/02/2019, 8:09 AM

## 2019-07-02 NOTE — Progress Notes (Signed)
Referring Physician(s): * No referring provider recorded for this case *  Supervising Physician: Julieanne Cotton  Patient Status:  [facility] - In-pt  Chief Complaint: Code Stroke  Subjective: Reocclusion overnight.  Now s/p craniectomy. Patient remains intubated, sedated.  Purposeful movement on left, none noted on right.   Allergies: Patient has no known allergies.  Medications: Prior to Admission medications   Medication Sig Start Date End Date Taking? Authorizing Provider  Caffeine-Magnesium Salicylate (DIUREX PO) Take 1 tablet by mouth daily as needed (fluid).   Yes [provider]  naproxen sodium (ALEVE) 220 MG tablet Take 220 mg by mouth 2 (two) times daily.   Yes [provider]     Vital Signs: BP 110/72   Pulse 66   Temp 98.9 F (37.2 C) (Axillary)   Resp (!) 21   Ht 6' (1.829 m)   Wt 253 lb 1.4 oz (114.8 kg)   SpO2 100%   BMI 34.32 kg/m   Physical Exam  Intubated, sedated Neuro:  Limited exam.  Spontaenous and purposeful movement in left upper and lower extremities. None noted on the right Groin:  Intact, no hematoma.   Imaging: CT Angio Head W/Cm &/Or Wo Cm  Result Date: 07/01/2019 CLINICAL DATA:  Stroke.  Right-sided weakness and aphasia. EXAM: CT ANGIOGRAPHY HEAD AND NECK CT PERFUSION BRAIN TECHNIQUE: Multidetector CT imaging of the head and neck was performed using the standard protocol during bolus administration of intravenous contrast. Multiplanar CT image reconstructions and MIPs were obtained to evaluate the vascular anatomy. Carotid stenosis measurements (when applicable) are obtained utilizing NASCET criteria, using the distal internal carotid diameter as the denominator. Multiphase CT imaging of the brain was performed following IV bolus contrast injection. Subsequent parametric perfusion maps were calculated using RAPID software. CONTRAST:  OMNIPAQUE IOHEXOL 350 MG/ML SOLN COMPARISON:  CT head earlier today FINDINGS: CTA  NECK FINDINGS Aortic arch: Standard branching. Imaged portion shows no evidence of aneurysm or dissection. No significant stenosis of the major arch vessel origins. Right carotid system: Normal right carotid without stenosis or atherosclerotic disease Left carotid system: Left carotid bifurcation patent without atherosclerotic disease. There is tapering of the left internal carotid artery proximally with occlusion in the mid neck. There is no atherosclerotic calcification. This is likely due to dissection. Vertebral arteries: Both vertebral arteries widely patent. Skeleton: No acute skeletal abnormality. Other neck: 12 mm left thyroid nodule. No mass or adenopathy in the neck. Upper chest: Mild apical emphysema without acute abnormality. Review of the MIP images confirms the above findings CTA HEAD FINDINGS Anterior circulation: Left internal carotid artery is occluded from the mid neck through the terminus. There is occlusion of the left middle cerebral arteries involving the left M1 M2 and M3 branches. There is poor collateral circulation. Right internal carotid artery widely patent. Right anterior and middle cerebral arteries patent. Left anterior cerebral artery is patent supplied from the right. Posterior circulation: Both vertebral arteries patent to the basilar. PICA patent bilaterally. Superior cerebellar and posterior cerebral arteries patent without stenosis. Venous sinuses: Superior sagittal sinus and right transverse sinus widely patent. Limited opacification of left transverse sinus likely due to arterial occlusion on the left. Anatomic variants: None Review of the MIP images confirms the above findings CT Brain Perfusion Findings: ASPECTS: Original report is 10. However in light of the CT perfusion and in further review of the CT, there are subtle low-density changes in the left hemisphere compatible with acute infarct. Updated aspects is 6 or  7. CBF (<30%) Volume: Perfusion (Tmax>6.0s) volume:  Mismatch Volume: 47mL Infarction Location:Left hemispheric infarct involving the left frontotemporal and parietal lobe. IMPRESSION: 1. 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 The patient has minimal atherosclerotic disease. The left internal carotid artery is occluded through the terminus and extending in the left M1 M2 and M3 branches. There is poor collateral circulation on the left. There is a large territory infarct involving the left hemisphere. Infarct volume 266 mL. Electronically Signed   By: Marlan Palau M.D.   On: 07/01/2019 08:36   DG Chest 1 View  Result Date: 07/01/2019 CLINICAL DATA:  Encounter for imaging to screen for metal prior to MRI. EXAM: CHEST  1 VIEW COMPARISON:  None. FINDINGS: There are no metallic foreign objects in the chest. Heart size and vascularity are normal. Lungs are clear. Bones are normal. IMPRESSION: Normal chest. Specifically, no evidence of metallic objects in the chest. Electronically Signed   By: Francene Boyers M.D.   On: 07/01/2019 09:13   DG Pelvis 1-2 Views  Result Date: 07/01/2019 CLINICAL DATA:  Encounter for imaging to screen for metal prior to MRI. EXAM: PELVIS - 1-2 VIEW COMPARISON:  None. FINDINGS: There are no metallic objects in the pelvis. Bones are normal. Contrast in the left ureter and bladder. IMPRESSION: Normal exam. No metallic objects in the pelvis. Electronically Signed   By: Francene Boyers M.D.   On: 07/01/2019 09:15   DG Abd 1 View  Result Date: 07/02/2019 CLINICAL DATA:  Orogastric tube placement EXAM: ABDOMEN - 1 VIEW COMPARISON:  07/01/2019 FINDINGS: Esophagogastric tube with tip and side port below the diaphragm. Nonobstructive pattern of bowel gas. IMPRESSION: Esophagogastric tube with tip and side port below the diaphragm. Electronically Signed   By: Lauralyn Primes M.D.   On: 07/02/2019 11:29   DG Abd 1 View  Result Date: 07/01/2019 CLINICAL DATA:  Encounter for imaging to  screen for metal prior to MRI. EXAM: ABDOMEN - 1 VIEW COMPARISON:  None. FINDINGS: There are no metallic objects in the abdomen or pelvis. The bowel gas pattern is normal. Contrast is seen in the renal collecting systems and bladder. Bones are normal. IMPRESSION: Normal exam.  No metallic objects in the abdomen. Electronically Signed   By: Francene Boyers M.D.   On: 07/01/2019 09:14   CT HEAD WO CONTRAST  Result Date: 07/02/2019 CLINICAL DATA:  Stroke follow-up EXAM: CT HEAD WITHOUT CONTRAST TECHNIQUE: Contiguous axial images were obtained from the base of the skull through the vertex without intravenous contrast. COMPARISON:  Brain MRI from yesterday FINDINGS: Brain: Complete dense infarction of the left ACA and MCA territories. The swollen brain is bulging through the defect. Subarachnoid and subdural/epidural blood along craniectomy flap. No rightward midline shift or entrapment. No visible area of new infarction Vascular: No new finding Skull: Large left craniectomy. Sinuses/Orbits: Negative IMPRESSION: 1. 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 No midline shift or entrapment. 2. Petechial hemorrhage at the left basal ganglia. Extra-axial hemorrhage along the surface of the infarct. Electronically Signed   By: Marnee Spring M.D.   On: 07/02/2019 05:43   CT HEAD WO CONTRAST  Result Date: 07/01/2019 CLINICAL DATA:  Stroke, follow-up. EXAM: CT HEAD WITHOUT CONTRAST TECHNIQUE: Contiguous axial images were obtained from the base of the skull through the vertex without intravenous contrast. COMPARISON:  Brain MRI 07/01/2019, noncontrast head CT, CT angiogram head/neck  and CT perfusion head performed earlier the same day 07/01/2019 FINDINGS: Brain: Residual circulating contrast material. There is irregular hyperdensity centered within the left basal ganglia, left subinsular region and inferomedial left temporal lobe measuring 3.0 x 2.6 x 3.9 cm  (AP x TV x CC) likely reflecting a combination of parenchymal hemorrhage and contrast staining. There is scattered small volume subarachnoid hemorrhage along the left cerebral hemisphere most notably within the left MCA cistern, left sylvian fissure and along the left frontoparietal convexity. There is edema with loss of gray-white differentiation within the left basal ganglia, left insula and anterior left temporal lobe, likely acute infarction. Subtle changes of acute infarction are also suspected within the paramedian anterior left frontal lobe ACA vascular territory. There is mass effect with effacement of the temporal horn of the left lateral ventricle no midline shift. No hydrocephalus. Vascular: Circulating contrast material limits evaluation for abnormal vessel hyperdensity. Skull: Normal. Negative for fracture or focal lesion. Sinuses/Orbits: Visualized orbits show no acute finding. Ethmoid sinus mucosal thickening. Small left sphenoid sinus mucous retention cyst. No significant mastoid effusion. Partially visualized life-support tubes. These results were called by telephone at the time of interpretation on 07/01/2019 at 12:15 pm to provider Dr. Laurence SlateAroor, who verbally acknowledged these results. IMPRESSION: 1. 3.0 x 2.6 x 3.9 cm region of hyperdensity centered within the left basal ganglia, left subinsular region and inferomedial left temporal lobe likely reflecting a combination of parenchymal hematoma and contrast staining. 2. Edema with loss of gray-white differentiation within the left basal ganglia, left insula and anterior left temporal lobe, likely acute infarction. Subtle changes of acute infarction are also suspected within the paramedian left frontal lobe ACA vascular territory. 3. Scattered small-volume subarachnoid hemorrhage along the left cerebral hemisphere. 4. Regional mass effect with effacement of the left lateral ventricle temporal horn. No midline shift. Electronically Signed   By: Jackey LogeKyle   Golden DO   On: 07/01/2019 12:19   CT Angio Neck W and/or Wo Contrast  Result Date: 07/01/2019 CLINICAL DATA:  Stroke.  Right-sided weakness and aphasia. EXAM: CT ANGIOGRAPHY HEAD AND NECK CT PERFUSION BRAIN TECHNIQUE: Multidetector CT imaging of the head and neck was performed using the standard protocol during bolus administration of intravenous contrast. Multiplanar CT image reconstructions and MIPs were obtained to evaluate the vascular anatomy. Carotid stenosis measurements (when applicable) are obtained utilizing NASCET criteria, using the distal internal carotid diameter as the denominator. Multiphase CT imaging of the brain was performed following IV bolus contrast injection. Subsequent parametric perfusion maps were calculated using RAPID software. CONTRAST:  100mL OMNIPAQUE IOHEXOL 350 MG/ML SOLN COMPARISON:  CT head earlier today FINDINGS: CTA NECK FINDINGS Aortic arch: Standard branching. Imaged portion shows no evidence of aneurysm or dissection. No significant stenosis of the major arch vessel origins. Right carotid system: Normal right carotid without stenosis or atherosclerotic disease Left carotid system: Left carotid bifurcation patent without atherosclerotic disease. There is tapering of the left internal carotid artery proximally with occlusion in the mid neck. There is no atherosclerotic calcification. This is likely due to dissection. Vertebral arteries: Both vertebral arteries widely patent. Skeleton: No acute skeletal abnormality. Other neck: 12 mm left thyroid nodule. No mass or adenopathy in the neck. Upper chest: Mild apical emphysema without acute abnormality. Review of the MIP images confirms the above findings CTA HEAD FINDINGS Anterior circulation: Left internal carotid artery is occluded from the mid neck through the terminus. There is occlusion of the left middle cerebral arteries involving the left M1 M2 and  M3 branches. There is poor collateral circulation. Right internal  carotid artery widely patent. Right anterior and middle cerebral arteries patent. Left anterior cerebral artery is patent supplied from the right. Posterior circulation: Both vertebral arteries patent to the basilar. PICA patent bilaterally. Superior cerebellar and posterior cerebral arteries patent without stenosis. Venous sinuses: Superior sagittal sinus and right transverse sinus widely patent. Limited opacification of left transverse sinus likely due to arterial occlusion on the left. Anatomic variants: None Review of the MIP images confirms the above findings CT Brain Perfusion Findings: ASPECTS: Original report is 10. However in light of the CT perfusion and in further review of the CT, there are subtle low-density changes in the left hemisphere compatible with acute infarct. Updated aspects is 6 or 7. CBF (<30%) Volume: Perfusion (Tmax>6.0s) volume: Mismatch Volume: 69mL Infarction Location:Left hemispheric infarct involving the left frontotemporal and parietal lobe. IMPRESSION: 1. 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 The patient has minimal atherosclerotic disease. The left internal carotid artery is occluded through the terminus and extending in the left M1 M2 and M3 branches. There is poor collateral circulation on the left. There is a large territory infarct involving the left hemisphere. Infarct volume 266 mL. Electronically Signed   By: Marlan Palau M.D.   On: 07/01/2019 08:36   MR ANGIO HEAD WO CONTRAST  Result Date: 07/01/2019 CLINICAL DATA:  Stroke, follow-up.  Status post intervention. EXAM: MRI HEAD WITHOUT CONTRAST MRA HEAD WITHOUT CONTRAST TECHNIQUE: Multiplanar, multiecho pulse sequences of the brain and surrounding structures were obtained without intravenous contrast. Angiographic images of the head were obtained using MRA technique without contrast. COMPARISON:  Noncontrast head CT performed earlier the same day at  11:50 a.m, brain MRI performed earlier the same day at 1:04 a.m., CTA head/neck and CT perfusion head performed earlier the same day 07/01/2019 FINDINGS: MRI HEAD FINDINGS Brain: A limited protocol brain MRI was performed at the request of the ordering clinician. Only axial and coronal diffusion-weighted imaging, an axial T2* sequence and an axial T2/FLAIR sequence were obtained. There is a large region of cortical/subcortical restricted diffusion consistent with acute ischemia within the left cerebral hemisphere throughout the majority of the left MCA and ACA vascular territories. There is involvement of the left frontal, parietal and temporal lobes as well as mild involvement of the lateral left occipital lobe. Restricted diffusion is also present throughout the majority of the left basal ganglia and within the left internal capsule. The acute parenchymal hemorrhage and/or contrast staining centered within the left basal ganglia and inferomedial left temporal lobe on prior CT does not appear significantly changed by MR modality. Based on the MR appearance, it is suspected that a significant proportion of the hyperdensity demonstrated on prior CT reflects contrast staining. Scattered sulcal FLAIR hyperintensity along the left cerebral hemisphere consistent with previously demonstrated small volume acute subarachnoid hemorrhage. Small volume subarachnoid hemorrhage is also questioned along the posterior right cerebral hemisphere. No significant mass effect on the current exam.  No midline shift. Vascular: Reported below Skull and upper cervical spine: No focal marrow lesion is identified on the acquired sequences. Sinuses/Orbits: No evidence of acute orbital abnormality on the acquired sequences. Redemonstrated paranasal sinus mucosal thickening greatest within the bilateral ethmoid air cells. MRA HEAD FINDINGS Some flow related signal is identified within the proximal left internal carotid artery. However, there is  no significant flow related signal arising from the more distal left ICA siphon or  supraclinoid segment. Findings likely reflect interval reocclusion of the intracranial left ICA. The M1 left MCA and more distal left MCA branch vessels demonstrate no flow related signal. Only minimal irregular flow related signal is seen within the A1 left ACA and proximal A2 left ACA. More distally, the left ACA is poorly delineated. The intracranial right internal carotid artery is patent without significant stenosis. The M1 middle cerebral artery is patent without significant stenosis. No right M2 proximal branch occlusion is identified. The right anterior cerebral artery is patent without significant stenosis. The visualized intracranial vertebral arteries are patent without significant stenosis, as is the basilar artery. The bilateral posterior cerebral arteries are patent without significant proximal stenosis. No intracranial aneurysm is identified. These results were called by telephone at the time of interpretation on 07/01/2019 at 12:55 pm to provider Arther Dames , who verbally acknowledged these results. IMPRESSION: MRI brain: 1. Restricted diffusion throughout the majority of the left MCA and ACA vascular territories consistent with acute ischemia. 2. No significant mass effect at this time. No midline shift. 3. The acute parenchymal hemorrhage and/or contrast staining centered within left basal ganglia and inferomedial left temporal lobe on prior head CT does not appear significantly changed. Based on the MR appearance, it is suspected that a significant component of this previously demonstrated hyperdensity reflects contrast staining. 4. Redemonstrated small volume subarachnoid hemorrhage overlying the left cerebral hemisphere. Small volume subarachnoid hemorrhage is also questioned along the right cerebral hemisphere posteriorly. MRA head: Findings consistent with reocclusion of the intracranial left internal carotid  artery. No flow related signal is seen within the left middle cerebral artery. There is only minimal, irregular flow related signal within the A1 and proximal A2 left ACA with the left ACA likely functionally occluded. Electronically Signed   By: Jackey Loge DO   On: 07/01/2019 13:37   MR BRAIN WO CONTRAST  Result Date: 07/01/2019 CLINICAL DATA:  Stroke, follow-up.  Status post intervention. EXAM: MRI HEAD WITHOUT CONTRAST MRA HEAD WITHOUT CONTRAST TECHNIQUE: Multiplanar, multiecho pulse sequences of the brain and surrounding structures were obtained without intravenous contrast. Angiographic images of the head were obtained using MRA technique without contrast. COMPARISON:  Noncontrast head CT performed earlier the same day at 11:50 a.m, brain MRI performed earlier the same day at 1:04 a.m., CTA head/neck and CT perfusion head performed earlier the same day 07/01/2019 FINDINGS: MRI HEAD FINDINGS Brain: A limited protocol brain MRI was performed at the request of the ordering clinician. Only axial and coronal diffusion-weighted imaging, an axial T2* sequence and an axial T2/FLAIR sequence were obtained. There is a large region of cortical/subcortical restricted diffusion consistent with acute ischemia within the left cerebral hemisphere throughout the majority of the left MCA and ACA vascular territories. There is involvement of the left frontal, parietal and temporal lobes as well as mild involvement of the lateral left occipital lobe. Restricted diffusion is also present throughout the majority of the left basal ganglia and within the left internal capsule. The acute parenchymal hemorrhage and/or contrast staining centered within the left basal ganglia and inferomedial left temporal lobe on prior CT does not appear significantly changed by MR modality. Based on the MR appearance, it is suspected that a significant proportion of the hyperdensity demonstrated on prior CT reflects contrast staining. Scattered  sulcal FLAIR hyperintensity along the left cerebral hemisphere consistent with previously demonstrated small volume acute subarachnoid hemorrhage. Small volume subarachnoid hemorrhage is also questioned along the posterior right cerebral hemisphere. No significant mass effect  on the current exam.  No midline shift. Vascular: Reported below Skull and upper cervical spine: No focal marrow lesion is identified on the acquired sequences. Sinuses/Orbits: No evidence of acute orbital abnormality on the acquired sequences. Redemonstrated paranasal sinus mucosal thickening greatest within the bilateral ethmoid air cells. MRA HEAD FINDINGS Some flow related signal is identified within the proximal left internal carotid artery. However, there is no significant flow related signal arising from the more distal left ICA siphon or supraclinoid segment. Findings likely reflect interval reocclusion of the intracranial left ICA. The M1 left MCA and more distal left MCA branch vessels demonstrate no flow related signal. Only minimal irregular flow related signal is seen within the A1 left ACA and proximal A2 left ACA. More distally, the left ACA is poorly delineated. The intracranial right internal carotid artery is patent without significant stenosis. The M1 middle cerebral artery is patent without significant stenosis. No right M2 proximal branch occlusion is identified. The right anterior cerebral artery is patent without significant stenosis. The visualized intracranial vertebral arteries are patent without significant stenosis, as is the basilar artery. The bilateral posterior cerebral arteries are patent without significant proximal stenosis. No intracranial aneurysm is identified. These results were called by telephone at the time of interpretation on 07/01/2019 at 12:55 pm to provider Arther Dames , who verbally acknowledged these results. IMPRESSION: MRI brain: 1. Restricted diffusion throughout the majority of the left MCA  and ACA vascular territories consistent with acute ischemia. 2. No significant mass effect at this time. No midline shift. 3. The acute parenchymal hemorrhage and/or contrast staining centered within left basal ganglia and inferomedial left temporal lobe on prior head CT does not appear significantly changed. Based on the MR appearance, it is suspected that a significant component of this previously demonstrated hyperdensity reflects contrast staining. 4. Redemonstrated small volume subarachnoid hemorrhage overlying the left cerebral hemisphere. Small volume subarachnoid hemorrhage is also questioned along the right cerebral hemisphere posteriorly. MRA head: Findings consistent with reocclusion of the intracranial left internal carotid artery. No flow related signal is seen within the left middle cerebral artery. There is only minimal, irregular flow related signal within the A1 and proximal A2 left ACA with the left ACA likely functionally occluded. Electronically Signed   By: Jackey Loge DO   On: 07/01/2019 13:37   MR BRAIN WO CONTRAST  Addendum Date: 07/01/2019   ADDENDUM REPORT: 07/01/2019 09:33 ADDENDUM: These results were called by telephone at the time of interpretation on 07/01/2019 at 9:33 am to provider Arther Dames , who verbally acknowledged these results. Electronically Signed   By: Jackey Loge DO   On: 07/01/2019 09:33   Result Date: 07/01/2019 CLINICAL DATA:  Stroke/TIA, assess extracranial arteries. Code stroke. EXAM: MRI HEAD WITHOUT CONTRAST TECHNIQUE: Multiplanar, multiecho pulse sequences of the brain and surrounding structures were obtained without intravenous contrast. COMPARISON:  CT angiogram head/neck and non-contrast head CT performed earlier the same day 07/01/2019 FINDINGS: Brain: A limited protocol brain MRI was performed at the ordering provider's request. Only axial diffusion-weighted imaging and axial T2 FLAIR sequence were obtained. The axial diffusion-weighted sequence is  severely motion degraded. Moderate motion degradation of the axial T2 FLAIR sequence. There is restricted diffusion within the left cerebral hemisphere and left basal ganglia throughout much of the left MCA vascular territory consistent with acute ischemia. Restricted diffusion is also present within the paramedian left frontoparietal lobes within the left ACA vascular territory consistent with acute ischemia, although this is less well assessed  due to the degree of motion degradation at the level of the vertex. There is little if any corresponding T2/FLAIR hyperintensity at these sites. No significant mass effect on the current exam.  No midline shift. Vascular: Poorly assessed on the acquired sequences. Skull and upper cervical spine: No focal calvarial lesion is identified on the acquired sequences. Sinuses/Orbits: No acute orbital abnormality is identified on the acquired sequences. Mild ethmoid sinus mucosal thickening. Left sphenoid sinus mucous retention cyst. IMPRESSION: Severely motion degraded diffusion-weighted imaging. Restricted diffusion throughout much of the left MCA vascular territory consistent with acute ischemia. Restricted diffusion consistent with acute ischemia is also present within the paramedian left frontoparietal lobes, although this is less well assessed due to the degree of motion degradation at the level of the vertex. There is little if any corresponding T2/FLAIR hyperintensity at these sites. No significant mass effect.  No midline shift. Electronically Signed: By: Jackey Loge DO On: 07/01/2019 09:23   CT CEREBRAL PERFUSION W CONTRAST  Result Date: 07/01/2019 CLINICAL DATA:  Stroke.  Right-sided weakness and aphasia. EXAM: CT ANGIOGRAPHY HEAD AND NECK CT PERFUSION BRAIN TECHNIQUE: Multidetector CT imaging of the head and neck was performed using the standard protocol during bolus administration of intravenous contrast. Multiplanar CT image reconstructions and MIPs were obtained  to evaluate the vascular anatomy. Carotid stenosis measurements (when applicable) are obtained utilizing NASCET criteria, using the distal internal carotid diameter as the denominator. Multiphase CT imaging of the brain was performed following IV bolus contrast injection. Subsequent parametric perfusion maps were calculated using RAPID software. CONTRAST:  OMNIPAQUE IOHEXOL 350 MG/ML SOLN COMPARISON:  CT head earlier today FINDINGS: CTA NECK FINDINGS Aortic arch: Standard branching. Imaged portion shows no evidence of aneurysm or dissection. No significant stenosis of the major arch vessel origins. Right carotid system: Normal right carotid without stenosis or atherosclerotic disease Left carotid system: Left carotid bifurcation patent without atherosclerotic disease. There is tapering of the left internal carotid artery proximally with occlusion in the mid neck. There is no atherosclerotic calcification. This is likely due to dissection. Vertebral arteries: Both vertebral arteries widely patent. Skeleton: No acute skeletal abnormality. Other neck: 12 mm left thyroid nodule. No mass or adenopathy in the neck. Upper chest: Mild apical emphysema without acute abnormality. Review of the MIP images confirms the above findings CTA HEAD FINDINGS Anterior circulation: Left internal carotid artery is occluded from the mid neck through the terminus. There is occlusion of the left middle cerebral arteries involving the left M1 M2 and M3 branches. There is poor collateral circulation. Right internal carotid artery widely patent. Right anterior and middle cerebral arteries patent. Left anterior cerebral artery is patent supplied from the right. Posterior circulation: Both vertebral arteries patent to the basilar. PICA patent bilaterally. Superior cerebellar and posterior cerebral arteries patent without stenosis. Venous sinuses: Superior sagittal sinus and right transverse sinus widely patent. Limited opacification of  left transverse sinus likely due to arterial occlusion on the left. Anatomic variants: None Review of the MIP images confirms the above findings CT Brain Perfusion Findings: ASPECTS: Original report is 10. However in light of the CT perfusion and in further review of the CT, there are subtle low-density changes in the left hemisphere compatible with acute infarct. Updated aspects is 6 or 7. CBF (<30%) Volume: Perfusion (Tmax>6.0s) volume: Mismatch Volume: 47mL Infarction Location:Left hemispheric infarct involving the left frontotemporal and parietal lobe. IMPRESSION: 1. 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 The patient has minimal atherosclerotic disease. The left internal carotid artery is occluded through the terminus and extending in the left M1 M2 and M3 branches. There is poor collateral circulation on the left. There is a large territory infarct involving the left hemisphere. Infarct volume 266 mL. Electronically Signed   By: Franchot Gallo M.D.   On: 07/01/2019 08:36   DG CHEST PORT 1 VIEW  Result Date: 07/02/2019 CLINICAL DATA:  Endotracheal tube placement EXAM: PORTABLE CHEST 1 VIEW COMPARISON:  07/02/2019 FINDINGS: Endotracheal tube 5.5 cm from carina. NG tube extends the stomach. PICC line with tip in distal SVC. Normal cardiac silhouette.  Lungs are clear. IMPRESSION: Support apparatus appears in good position. Electronically Signed   By: Suzy Bouchard M.D.   On: 07/02/2019 12:14   DG CHEST PORT 1 VIEW  Result Date: 07/02/2019 CLINICAL DATA:  Orogastric tube placement EXAM: PORTABLE CHEST 1 VIEW COMPARISON:  07/01/2019 FINDINGS: Interval placement of esophagogastric tube, tip and side port below the diaphragm. Endotracheal tube has been slightly retracted, and is positioned near the thoracic inlet, approximately 7 cm above the carina. Interval placement of right chest vascular catheter, tip projecting over the IVC. Mild,  diffuse interstitial pulmonary opacity. IMPRESSION: 1. Interval placement of esophagogastric tube, tip and side port below the diaphragm. 2. Endotracheal tube has been slightly retracted, and is positioned near the thoracic inlet, approximately 7 cm above the carina. 3. Interval placement of right chest vascular catheter, tip projecting over the IVC. 4. Mild, diffuse interstitial pulmonary opacity, likely edema. No focal airspace opacity. Electronically Signed   By: Eddie Candle M.D.   On: 07/02/2019 11:28   DG Chest Port 1 View  Result Date: 07/01/2019 CLINICAL DATA:  Code stroke.  Respiratory failure. EXAM: PORTABLE CHEST 1 VIEW COMPARISON:  07/01/2019 FINDINGS: An endotracheal tube is identified with tip 4.5 cm above the carina. The cardiomediastinal silhouette is unremarkable. Mild pulmonary vascular congestion is present. There is no evidence of focal airspace disease, pulmonary edema, suspicious pulmonary nodule/mass, pleural effusion, or pneumothorax. No acute bony abnormalities are identified. IMPRESSION: Mild pulmonary vascular congestion. Endotracheal tube with tip 4.5 cm above the carina. Electronically Signed   By: Margarette Canada M.D.   On: 07/01/2019 14:28   CT HEAD CODE STROKE WO CONTRAST  Result Date: 07/01/2019 CLINICAL DATA:  Code stroke.  Right-sided weakness aphasia EXAM: CT HEAD WITHOUT CONTRAST TECHNIQUE: Contiguous axial images were obtained from the base of the skull through the vertex without intravenous contrast. COMPARISON:  None. FINDINGS: Brain: No evidence of acute infarction, hemorrhage, hydrocephalus, extra-axial collection or mass lesion/mass effect. Vascular: Hyperdense left MCA and terminal left ICA compatible with acute thrombus. Skull: Negative Sinuses/Orbits: Mild mucosal edema paranasal sinuses. Negative orbit Other: None ASPECTS (Nimrod Stroke Program Early CT Score) - Ganglionic level infarction (caudate, lentiform nuclei, internal capsule, insula, M1-M3 cortex): 7 -  Supraganglionic infarction (M4-M6 cortex): 3 Total score (0-10 with 10 being normal): 10 IMPRESSION: 1. Hyperdense left ICA and MCA compatible with acute thrombus. No acute infarct or hemorrhage. 2. ASPECTS is 10 3. Results texted to Dr. Lorraine Lax via Shea Evans. Electronically Signed   By: Franchot Gallo M.D.   On: 07/01/2019 08:17    Labs:  CBC: Recent Labs    07/01/19 0759 07/01/19 0804 07/01/19 1821 07/01/19 1834 07/01/19 1930 07/01/19 2032 07/02/19 0759 07/02/19 0806  WBC 6.6  --   --   --  11.5*  --  11.0*  --   HGB 15.9   < >  10.1*   < > 14.9 13.9 14.9 14.3  HCT 48.3   < > 29.6*   < > 44.2 41.0 44.0 42.0  PLT 166  --  55*  --  41*  --  51*  --    < > = values in this interval not displayed.    COAGS: Recent Labs    07/01/19 0759 07/01/19 1821  INR 1.0 1.4*  APTT 26 49*    BMP: Recent Labs    07/01/19 0759 07/01/19 0759 07/01/19 0804 07/01/19 1629 07/01/19 2009 07/01/19 2009 07/01/19 2032 07/01/19 2032 07/02/19 0151 07/02/19 0759 07/02/19 0806 07/02/19 1430  NA 139   < > 139   < > 142   < > 143   < > 144 149* 149* 150*  K 4.1   < > 3.9   < > 3.7  --  3.6  --   --  4.2 4.1  --   CL 108  --  105  --  110  --   --   --   --  118*  --   --   CO2 25  --   --   --  22  --   --   --   --  23  --   --   GLUCOSE 160*  --  151*  --  178*  --   --   --   --  160*  --   --   BUN 7  --  7  --  6  --   --   --   --  8  --   --   CALCIUM 8.6*  --   --   --  8.7*  --   --   --   --  8.4*  --   --   CREATININE 1.07  --  1.00  --  0.97  --   --   --   --  0.97  --   --   GFRNONAA >60  --   --   --  >60  --   --   --   --  >60  --   --   GFRAA >60  --   --   --  >60  --   --   --   --  >60  --   --    < > = values in this interval not displayed.    LIVER FUNCTION TESTS: Recent Labs    07/01/19 0759 07/01/19 2009 07/02/19 0759  BILITOT 0.6 2.7* 0.8  AST 22 39 28  ALT 27 40 34  ALKPHOS 50 40 34*  PROT 5.8* 5.2* 5.1*  ALBUMIN 3.4* 3.2* 3.0*    Assessment and Plan:  S/p thrombectomy of ICA and L MCA achieving TICI 2C revascularization. Post procedure CT showed hemorrhage in the L basal ganglia Patient with re-thrombsis of ICA/MCA with localized cerebral edema requiring hemicraniectomy overnight. He remains intubated today, sedated.  Groin site stable.  Purposeful movement on left at times, no movement noted on right during exam today.  BP control per Neuro/Neurosurgery.  Plavix and aspirin held in light of surgery.  Further plans per primary team.   Electronically Signed: Hoyt Koch, PA 07/02/2019, 6:18 PM   I spent a total of 15 Minutes at the the patient's bedside AND on the patient's hospital floor or unit, greater than 50% of which was counseling/coordinating care for MCA/ICA CVA.

## 2019-07-02 NOTE — Progress Notes (Signed)
RT advanced ETT tube by 2cm per MD order. ETT tube is now at 24 at the lip and is secure.

## 2019-07-02 NOTE — Progress Notes (Signed)
PT Cancellation Note  Patient Details Name: Lee Hooper MRN: [id number] DOB: 1970/08/17   Cancelled Treatment:    Reason Eval/Treat Not Completed: Patient not medically ready   Arlyss Gandy 07/02/2019, 2:12 PM

## 2019-07-02 NOTE — Social Work (Signed)
CSW was unable to complete sbirt due to pt being on the ventilator. CSW may attempt to complete at more appropriate time.   Abelardo Seidner, LCSWA, LCASA Clinical Social Worker [phone]    

## 2019-07-02 NOTE — Progress Notes (Signed)
OT Cancellation Note  Patient Details Name: AKSHAR STARNES MRN: [id number] DOB: March 28, 1970   Cancelled Treatment:    Reason Eval/Treat Not Completed: Patient not medically ready  Burnett Corrente Adeleine Pask, OT/L   Acute OT Clinical Specialist Acute Rehabilitation Services Pager [phone] Office [phone]  07/02/2019, 1:25 PM

## 2019-07-02 NOTE — Progress Notes (Signed)
Transported patient to CT and back to 4N22 without event.  

## 2019-07-02 NOTE — Procedures (Signed)
Critical care central line insertion note  Indication: Hypertonic saline  Consent obtained: Verbal consent from wife.  Preprocedural timeout called: Yes  Full aseptic bundle used: Full bundle in place  Ultrasound guidance: Yes  Line inserted: 7 French triple-lumen 20 cm catheter  Insertion depth: 15 cm  Vessel used: Right subclavian  Number of attempts: 2.  Line placement confirmation: Guidewire visualized vessel  X-ray performed: No pneumothorax.  Tip projects over mid SVC.  Lynnell Catalan, MD [facility] ICU Physician [facility] [place] [facility]  Pager: [phone] Mobile: [phone] After hours: [phone].  04/28/2018, 6:28 PM

## 2019-07-02 NOTE — Progress Notes (Signed)
Patient ID: Lee Hooper, male   DOB: 01/26/1971, 49 y.o.   MRN: [id number] BP 130/84   Pulse 65   Temp 98.9 F (37.2 C) (Axillary)   Resp 18   Ht 6' (1.829 m)   Wt 114.8 kg   SpO2 97%   BMI 34.32 kg/m  Localizing with left upper extremity.  perrl +cough, gag Exam unchanged. Remains intubated.

## 2019-07-02 NOTE — Progress Notes (Addendum)
NAME:  Lee Hooper, MRN:  [id number], DOB:  [birth year], LOS: 1 ADMISSION DATE:  07/01/2019, CONSULTATION DATE:  07/01/2019 REFERRING MD:  [name], CHIEF COMPLAINT:  CVA   Brief History   Lee Hooper is a 49 yo M who presented as a code stroke after being found aphasic and nonverbal by his wife. CT/CTA studies showed left ICA/MCA thrombosis. Patient was admitted by Neurology and went for IR atherectomy, which achieved revascularization. He was intubated for airway protection. PCCM consulted for vent management.  History of present illness   49 yo male with COPD and psoriasis who presented to [name] on 07/01/19 for right hemiplegia and aphasia. LKN at 11pm on evening prior to admission.  Daughter is primary historian. Remainder of HPI obtained from chart review. She notes that she resides with the pt and that she did hear him get up this morning around 0650 however found him in the restroom with right hemiplegia and expressive aphasia.  On arrival to the ED, head imaging revealed large ICA/MCA thrombosis and pt was taken for IR thrombectomy for left ICA and left M1 occlusions. Repeat head CT was obtained following the procedure out of concern for possible hemorrhage and showed moderate basal ganglia hemorrhage vs stroke. Repeat MRI revealed that the ICA and MCA re-occluded with change in FLAIR sequence excluding him from repeat thrombectomy.  Neurosurgery was consulted for decompressive hemicraniectomy. Past Medical History  Psoriasis COPD Tobacco Use  Significant Hospital Events   4/28: Admit; L ICA/MCA Thrombectomy with re-occlusion>left hemicraniectomy 4/29: bradycardic overnight  Consults:  IR PCCM Neuro (Primary) Neurosurgery  Procedures:  4/28 IR - L ICA/MCA Thrombectomy 4/28 L hemicraniectomy  Significant Diagnostic Tests:  4/28 Admit CT Head Code Stroke: Hyperdense left ICA and MCA compatible with acute thrombus. No acute infarct or hemorrhage.  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 The patient has minimal atherosclerotic disease. The left internal carotid artery is occluded through the terminus and extending in the left M1 M2 and M3 branches. There is poor collateral circulation on the left. There is a large territory infarct involving the left hemisphere. Infarct volume 266 mL.  4/28 Admit MR Brain: Restricted diffusion throughout much of the left MCA vascular territory consistent with acute ischemia. Restricted diffusion consistent with acute ischemia is also present within the paramedian left frontoparietal lobes, although this is less well assessed due to the degree of motion degradation at the level of the vertex. There is little if any corresponding T2/FLAIR hyperintensity at these sites. No significant mass effect.  No midline shift.  4/28 Post Thrombectomy CT Head: IMPRESSION: 1. 3.0 x 2.6 x 3.9 cm region of hyperdensity centered within the left basal ganglia, left subinsular region and inferomedial left temporal lobe likely reflecting a combination of parenchymal hematoma and contrast staining.  2. Edema with loss of gray-white differentiation within the left basal ganglia, left insula and anterior left temporal lobe, likely acute infarction. Subtle changes of acute infarction are also suspected within the paramedian left frontal lobe ACA vascular territory. 3. Scattered small-volume subarachnoid hemorrhage along the left cerebral hemisphere. 4. Regional mass effect with effacement of the left lateral ventricle temporal horn. No midline shift.  4/28 Post Thrombectomy MR Brain:  1. Restricted diffusion throughout the majority of the left MCA and ACA vascular territories consistent with acute ischemia. 2. No significant mass effect at this time. No midline shift. 3. The acute  parenchymal hemorrhage and/or contrast staining centered within left basal  ganglia and inferomedial left temporal lobe on prior head CT does not appear significantly changed. Based on the MR appearance, it is suspected that a significant component of this previously demonstrated hyperdensity reflects contrast staining. 4. Redemonstrated small volume subarachnoid hemorrhage overlying the left cerebral hemisphere. Small volume subarachnoid hemorrhage is also questioned along the right cerebral hemisphere posteriorly.  4/29 Post Hemicraniectomy CT head: complete left ACA/MCA territory infarction with swollen brain bulging through craniectomy defect. No midline shift or entrapment. Petechial hemorrhage at left basal ganglia. Extraaxial hemorrhage along surface of the infarct.  Micro Data:  4/28 Negative Coronavirus PCR  Antimicrobials:  None   Interim history/subjective:  Bradycardic overnight. Sedated and intubated this morning.  Objective   Blood pressure 116/76, pulse (!) 46, temperature 98.6 F (37 C), temperature source Axillary, resp. rate 18, height 6' (1.829 m), SpO2 96 %.    Vent Mode: PRVC FiO2 (%):  [40 %-100 %] 40 % Set Rate:  [16 bmp] 16 bmp Vt Set:  [620 mL] 620 mL PEEP:  [5 cmH20] 5 cmH20 Plateau Pressure:  [18 cmH20-23 cmH20] 18 cmH20   Intake/Output Summary (Last 24 hours) at 07/02/2019 0736 Last data filed at 07/02/2019 0600 Gross per 24 hour  Intake 19720.4 ml  Output 40981 ml  Net 5095.4 ml     Examination: General: Intubated, Sedated HENT: ETT in place. Dressing in place over head. Swelling over the left lid. Lungs: mechanial sounds bilaterally. No crackles appreciated. Cardiovascular: bradycardic rate. Regular rhythm. Extremities warm. No peripheral edema Abdomen: Obese abdomen, decreased bowl sounds Extremities: warm Neuro: Sedated. Difficulty assessing pupillary function on the left due to swelling. Right pupil reactive to light. No movement in extremities at time of exam.  Resolved Hospital Problem list   n/a  Assessment &  Plan:   COPD Respiratory insufficiency: Intubated for airway protection at the time of thrombectomy. Mental status barring extubation. Plan  - Continue MV - VAP Bundle  Left ICA/MCA CVA: Underwent thrombectomy, initially was reperfused, but rethrombosed on repeat brain imaging.  Post op day #1 of left decompressive hemicraniectomy. Complicated by significant bleeding during procedure. Moving LLE post op per neurosurgery note. Hypertensive this morning>neo off. Post op head CT with swelling but no midline shift or entrapment. Bradycardic overnight.  Management per neurosurgery Plan - management per primary (neurology) and neurosurgery - SBP goal :120-140. cleviprex with prn labetalol for blood pressure control - hypertonic saline and hyperventilation for cerebral edema prophylaxis - continue keppra - follow up CBC - monitor Na for goal of 150-155  Acute encephalopathy 2/2 sedation and CVA -prn fentanyl -RASS goal 0 to -1  High risk malnutrition. Consider starting feeds today. Best practice:  Diet: NPO Pain/Anxiety/Delirium protocol (if indicated): fentanyl VAP protocol (if indicated): Yes DVT prophylaxis: SCDs GI prophylaxis: PPI Glucose control: n/a Mobility: BR Code Status: Full Family Communication: will update later Disposition: ICU  Labs   CBC: Recent Labs  Lab 07/01/19 0759 07/01/19 0804 07/01/19 1754 07/01/19 1821 07/01/19 1834 07/01/19 1930 07/01/19 2032  WBC 6.6  --   --   --   --  11.5*  --   NEUTROABS 4.3  --   --   --   --   --   --   HGB 15.9   < > 10.5* 10.1* 9.9* 14.9 13.9  HCT 48.3   < > 31.0* 29.6* 29.0* 44.2 41.0  MCV 92.2  --   --   --   --  86.7  --   PLT 166  --   --  55*  --  41*  --    < > = values in this interval not displayed.    Basic Metabolic Panel: Recent Labs  Lab 07/01/19 0759 07/01/19 0759 07/01/19 0804 07/01/19 1629 07/01/19 1706 07/01/19 1706 07/01/19 1754 07/01/19 1754 07/01/19 1834 07/01/19 1930 07/01/19 2009  07/01/19 2032 07/02/19 0151  NA 139   < > 139   < > 142   < > 142   < > 140 142 142 143 144  K 4.1   < > 3.9   < > 3.5  --  5.0  --  6.7*  --  3.7 3.6  --   CL 108  --  105  --   --   --   --   --   --   --  110  --   --   CO2 25  --   --   --   --   --   --   --   --   --  22  --   --   GLUCOSE 160*  --  151*  --   --   --   --   --   --   --  178*  --   --   BUN 7  --  7  --   --   --   --   --   --   --  6  --   --   CREATININE 1.07  --  1.00  --   --   --   --   --   --   --  0.97  --   --   CALCIUM 8.6*  --   --   --   --   --   --   --   --   --  8.7*  --   --    < > = values in this interval not displayed.   GFR: CrCl cannot be calculated (Unknown ideal weight.). Recent Labs  Lab 07/01/19 0759 07/01/19 1930  WBC 6.6 11.5*    Liver Function Tests: Recent Labs  Lab 07/01/19 0759 07/01/19 2009  AST 22 39  ALT 27 40  ALKPHOS 50 40  BILITOT 0.6 2.7*  PROT 5.8* 5.2*  ALBUMIN 3.4* 3.2*   No results for input(s): LIPASE, AMYLASE in the last 168 hours. No results for input(s): AMMONIA in the last 168 hours.  ABG    Component Value Date/Time   PHART 7.309 (L) 07/01/2019 2032   PCO2ART 47.6 07/01/2019 2032   PO2ART 209 (H) 07/01/2019 2032   HCO3 23.9 07/01/2019 2032   TCO2 25 07/01/2019 2032   ACIDBASEDEF 3.0 (H) 07/01/2019 2032   O2SAT 100.0 07/01/2019 2032     Coagulation Profile: Recent Labs  Lab 07/01/19 0759 07/01/19 1821  INR 1.0 1.4*    Cardiac Enzymes: No results for input(s): CKTOTAL, CKMB, CKMBINDEX, TROPONINI in the last 168 hours.  HbA1C: Hgb A1c MFr Bld  Date/Time Value Ref Range Status  07/02/2019 04:31 AM 5.5 4.8 - 5.6 % Final    Comment:    (NOTE) Pre diabetes:          5.7%-6.4% Diabetes:              >6.4% Glycemic control for   <7.0% adults with diabetes       Elige Radon, MD  Internal Medicine  Resident PGY1 Pager # [phone] 07/02/19 7:36 AM

## 2019-07-02 NOTE — Progress Notes (Signed)
STROKE TEAM PROGRESS NOTE   INTERVAL HISTORY His medical team and RN is at the bedside. Unfortunately, after admission and attempt for Left ICA and Left M1 thrombectomy, the pt had post procedure  reocclusion of the vessels. F/u imaging suggested early edema and pt underwent prophylactic decompressive hemicraniectomy. CCM consulted for vent management and hypertonic saline. Patient is intubated for respiratory failure and on light sedation.  He has semipurposeful left-sided movement spontaneously with dense right hemiplegia.  He has global aphasia and does not follow any commands.  He has some spontaneous side-to-side eye movements.  Pressure adequately controlled.  He is on hypertonic saline drip and serum sodium is 149.  CT scan of the head this morning shows large left ACA and MCA infarct with cytotoxic edema and slight hemorrhagic transformation and postoperative changes of hemicraniectomy with herniated brain outside the craniotomy defect. Vitals:   07/02/19 1015 07/02/19 1020 07/02/19 1200 07/02/19 1300  BP: 117/66     Pulse: (!) 110 (!) 111    Resp:      Temp:   99.1 F (37.3 C)   TempSrc:   Axillary   SpO2: 97% 97%    Weight:    114.8 kg  Height:    6' (1.829 m)    CBC:  Recent Labs  Lab 07/01/19 0759 07/01/19 0804 07/01/19 1930 07/01/19 2032 07/02/19 0759 07/02/19 0806  WBC 6.6   < > 11.5*  --  11.0*  --   NEUTROABS 4.3  --   --   --  9.5*  --   HGB 15.9   < > 14.9   < > 14.9 14.3  HCT 48.3   < > 44.2   < > 44.0 42.0  MCV 92.2   < > 86.7  --  84.1  --   PLT 166   < > 41*  --  51*  --    < > = values in this interval not displayed.    Basic Metabolic Panel:  Recent Labs  Lab 07/01/19 2009 07/01/19 2032 07/02/19 0759 07/02/19 0806  NA 142   < > 149* 149*  K 3.7   < > 4.2 4.1  CL 110  --  118*  --   CO2 22  --  23  --   GLUCOSE 178*  --  160*  --   BUN 6  --  8  --   CREATININE 0.97  --  0.97  --   CALCIUM 8.7*  --  8.4*  --   MG  --   --  1.8  --   PHOS  --    --  2.3*  --    < > = values in this interval not displayed.   Lipid Panel:     Component Value Date/Time   CHOL 92 07/02/2019 0431   TRIG 86 07/02/2019 0431   HDL 30 (L) 07/02/2019 0431   CHOLHDL 3.1 07/02/2019 0431   VLDL 17 07/02/2019 0431   LDLCALC 45 07/02/2019 0431   HgbA1c:  Lab Results  Component Value Date   HGBA1C 5.5 07/02/2019   Urine Drug Screen:     Component Value Date/Time   LABOPIA NONE DETECTED 07/01/2019 1930   COCAINSCRNUR NONE DETECTED 07/01/2019 1930   LABBENZ POSITIVE (A) 07/01/2019 1930   AMPHETMU NONE DETECTED 07/01/2019 1930   THCU NONE DETECTED 07/01/2019 1930   LABBARB NONE DETECTED 07/01/2019 1930    Alcohol Level     Component Value Date/Time  ETH <10 07/01/2019 0759    IMAGING past 24 hours DG Abd 1 View  Result Date: 07/02/2019 CLINICAL DATA:  Orogastric tube placement EXAM: ABDOMEN - 1 VIEW COMPARISON:  07/01/2019 FINDINGS: Esophagogastric tube with tip and side port below the diaphragm. Nonobstructive pattern of bowel gas. IMPRESSION: Esophagogastric tube with tip and side port below the diaphragm. Electronically Signed   By: Lauralyn Primes M.D.   On: 07/02/2019 11:29   CT HEAD WO CONTRAST  Result Date: 07/02/2019 CLINICAL DATA:  Stroke follow-up EXAM: CT HEAD WITHOUT CONTRAST TECHNIQUE: Contiguous axial images were obtained from the base of the skull through the vertex without intravenous contrast. COMPARISON:  Brain MRI from yesterday FINDINGS: Brain: Complete dense infarction of the left ACA and MCA territories. The swollen brain is bulging through the defect. Subarachnoid and subdural/epidural blood along craniectomy flap. No rightward midline shift or entrapment. No visible area of new infarction Vascular: No new finding Skull: Large left craniectomy. Sinuses/Orbits: Negative IMPRESSION: 1. 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 No midline shift or entrapment. 2.  Petechial hemorrhage at the left basal ganglia. Extra-axial hemorrhage along the surface of the infarct. Electronically Signed   By: Marnee Spring M.D.   On: 07/02/2019 05:43   DG CHEST PORT 1 VIEW  Result Date: 07/02/2019 CLINICAL DATA:  Endotracheal tube placement EXAM: PORTABLE CHEST 1 VIEW COMPARISON:  07/02/2019 FINDINGS: Endotracheal tube 5.5 cm from carina. NG tube extends the stomach. PICC line with tip in distal SVC. Normal cardiac silhouette.  Lungs are clear. IMPRESSION: Support apparatus appears in good position. Electronically Signed   By: Genevive Bi M.D.   On: 07/02/2019 12:14   DG CHEST PORT 1 VIEW  Result Date: 07/02/2019 CLINICAL DATA:  Orogastric tube placement EXAM: PORTABLE CHEST 1 VIEW COMPARISON:  07/01/2019 FINDINGS: Interval placement of esophagogastric tube, tip and side port below the diaphragm. Endotracheal tube has been slightly retracted, and is positioned near the thoracic inlet, approximately 7 cm above the carina. Interval placement of right chest vascular catheter, tip projecting over the IVC. Mild, diffuse interstitial pulmonary opacity. IMPRESSION: 1. Interval placement of esophagogastric tube, tip and side port below the diaphragm. 2. Endotracheal tube has been slightly retracted, and is positioned near the thoracic inlet, approximately 7 cm above the carina. 3. Interval placement of right chest vascular catheter, tip projecting over the IVC. 4. Mild, diffuse interstitial pulmonary opacity, likely edema. No focal airspace opacity. Electronically Signed   By: Lauralyn Primes M.D.   On: 07/02/2019 11:28    PHYSICAL EXAM Obese middle-aged Caucasian male who is sedated and intubated.  He has post craniotomy bandage on his head. . Afebrile. Head is nontraumatic. Neck is supple without bruit.    Cardiac exam no murmur or gallop. Lungs are clear to auscultation. Distal pulses are well felt. Neurological Exam : Patient is sedated and intubated on mechanical ventilation.   His eyes are closed.  There is mechanical ptosis from swelling around the left eye.  Eyes in primary position with some spontaneous roving side-to-side eye movements.  Pupils 2 mm reactive.  Corneal reflexes are present.  Does not blink to threat on either side.  Right lower facial weakness.  Tongue midline.  Is spontaneous left upper and lower extremity movements with good tone against gravity.  Right flaccid hemiplegia with only trace withdrawal in the leg to noxious stimuli and not in the right upper extremity.  Right plantar upgoing left downgoing.  ASSESSMENT/PLAN [name] with history of COPD, ongoing tobacco use, psoriasis, presenting with dense LMCA syndrome. Attempt for Left ICA and Left M1 thrombectomy was done emergently with TICI 2b flow. Unfortunately, the pt had post procedure hemorrhage and reocclusion of the vessels. F/u imaging suggested early edema and pt underwent prophylactic decompressive hemicraniectomy.  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  Stroke etiology unknown at this time code Stroke CT head showed early hypodensity in nearly entire LMCA. ASPECTS 6-7 per notes.  CTA head & neck occlusion cervical internal carotid artery on the left.  This is most likely due to dissection. The patient has minimal atherosclerotic disease.  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  There is poor collateral circulation on the left.  There is a large territory infarct involving the left  hemisphere with volume approximately 266 mL.  CT perfusion CBF less than 30% with volume of 266 mL, perfusion volume 313 mL, mismatch volume 47 mL.  ASPECTS using CTP by Dr. Chestine Spore 6 or 7.   MRI  severely motion degraded diffusion-imaging.  Restricted diffusion throughout much of the left MCA vascular territory consistent with acute ischemia.  Restricted diffusion consistent with acute ischemia present within the paramedian left frontal parietal lobes.  There is little if any corresponding T2/flair hyperintensity at the sites.  2D Echo pending  LDL 45  HgbA1c 5.5  SCDs d/t bleed for VTE prophylaxis    Diet   Diet NPO time specified     none prior to admission, now on none d/t bleed  Therapy recommendations:  pending  Disposition:  pending  Hypertension  Home meds:  none  Unstable . Permissive hypertension (OK if < 180/105) but gradually normalize in 5-7 days . Long-term BP goal normotensive  Hyperlipidemia  Home meds:  none  LDL 45, goal < 70  No statin indicated at this time  No h/o Diabetes type II  HgbA1c 5.5, goal < 7.0  CBGs Recent Labs    07/02/19 0336 07/02/19 0759 07/02/19 1148  GLUCAP 156* 151* 169*      SSI  Other Stroke Risk Factors  Cigarette smoker; unable to currently advise to stop smoking  Obesity, Body mass index is 34.32 kg/m., recommend weight loss, diet and exercise as appropriate   Other Active Problems  Acute Respiratory Failure- Vented  Clinically significant Cerebral edema and brain compression- s/p Craniectomy and currently with hypertonic Na  Central Line placement today by [facility] team  Hospital day # 1 Desiree Metzger-Cihelka, ARNP-C, ANVP-BC Pager: [phone]  I have personally obtained history,examined this patient, reviewed notes, independently viewed imaging studies, participated in medical decision making and plan of care.ROS completed by me personally and pertinent positives fully documented  I have made any  additions or clarifications directly to the above note. Agree with note above.. Patient presented with terminal left ICA and M1 occlusion outside TPA window and underwent successful revascularization unfortunately followed by reocclusion with a massive infarct size and edema requiring early decompressive hemicraniectomy.  CT scan from this morning shows significant cytotoxic edema along with some hemorrhagic transformation and postop changes.  He will require prolonged ventilatory support and treatment for cytotoxic edema of the neck several days.  Continue hypertonic saline with serum sodium goal 150-155.  Strict blood pressure control with systolic 009-381 range for 24 hours and then below 160.  Keep normothermic, euglycemic and euvolemic.  Long discussion with patient's wife over the phone about his condition, prognosis and plan of care and answered questions.  Discussed with Dr. Lynetta Mare in critical care team. This patient is critically ill and at significant risk of neurological worsening, death and care requires constant monitoring of vital signs, hemodynamics,respiratory and cardiac monitoring, extensive review of multiple databases, frequent neurological assessment, discussion with family, other specialists and medical decision making of high complexity.I have made any additions or clarifications directly to the above note.This critical care time does not reflect procedure time, or teaching time or supervisory time of PA/NP/Med Resident etc but could involve care discussion time.  I spent 60 minutes of neurocritical care time  in the care of  this patient.     Antony Contras, MD Medical Director [place] Pager: [phone] 07/02/2019 3:16 PM  To contact Stroke Continuity provider, please refer to [email or web address] After hours, contact General Neurology

## 2019-07-03 ENCOUNTER — Inpatient Hospital Stay (HOSPITAL_COMMUNITY): Payer: Medicaid Other

## 2019-07-03 LAB — TYPE AND SCREEN
ABO/RH(D): B POS
Antibody Screen: NEGATIVE
Unit division: 0
Unit division: 0
Unit division: 0
Unit division: 0
Unit division: 0
Unit division: 0
Unit division: 0
Unit division: 0
Unit division: 0
Unit division: 0
Unit division: 0
Unit division: 0
Unit division: 0
Unit division: 0
Unit division: 0
Unit division: 0
Unit division: 0
Unit division: 0
Unit division: 0
Unit division: 0
Unit division: 0
Unit division: 0
Unit division: 0
Unit division: 0
Unit division: 0
Unit division: 0
Unit division: 0
Unit division: 0

## 2019-07-03 LAB — BPAM RBC
Blood Product Expiration Date: 202105152359
Blood Product Expiration Date: 202105152359
Blood Product Expiration Date: 202105202359
Blood Product Expiration Date: 202105242359
Blood Product Expiration Date: 202105242359
Blood Product Expiration Date: 202105252359
Blood Product Expiration Date: 202105252359
Blood Product Expiration Date: 202105252359
Blood Product Expiration Date: 202105252359
Blood Product Expiration Date: 202105252359
Blood Product Expiration Date: 202105252359
Blood Product Expiration Date: 202105252359
Blood Product Expiration Date: 202105272359
Blood Product Expiration Date: 202105292359
Blood Product Expiration Date: 202105292359
Blood Product Expiration Date: 202105292359
Blood Product Expiration Date: 202105292359
Blood Product Expiration Date: 202105292359
Blood Product Expiration Date: 202105292359
Blood Product Expiration Date: 202105292359
Blood Product Expiration Date: 202105292359
Blood Product Expiration Date: 202105292359
Blood Product Expiration Date: 202105312359
Blood Product Expiration Date: 202105312359
Blood Product Expiration Date: 202105312359
Blood Product Expiration Date: 202105312359
Blood Product Expiration Date: 202105312359
Blood Product Expiration Date: 202105312359
ISSUE DATE / TIME: 202104281646
ISSUE DATE / TIME: 202104281646
ISSUE DATE / TIME: 202104281646
ISSUE DATE / TIME: 202104281646
ISSUE DATE / TIME: 202104281714
ISSUE DATE / TIME: 202104281714
ISSUE DATE / TIME: 202104281714
ISSUE DATE / TIME: 202104281714
ISSUE DATE / TIME: 202104281727
ISSUE DATE / TIME: 202104281727
ISSUE DATE / TIME: 202104281727
ISSUE DATE / TIME: 202104281727
ISSUE DATE / TIME: 202104281740
ISSUE DATE / TIME: 202104281740
ISSUE DATE / TIME: 202104281740
ISSUE DATE / TIME: 202104281740
ISSUE DATE / TIME: 202104281751
ISSUE DATE / TIME: 202104281751
ISSUE DATE / TIME: 202104281751
ISSUE DATE / TIME: 202104281751
ISSUE DATE / TIME: 202104281806
ISSUE DATE / TIME: 202104281806
ISSUE DATE / TIME: 202104281806
ISSUE DATE / TIME: 202104281806
ISSUE DATE / TIME: 202104281846
ISSUE DATE / TIME: 202104281846
ISSUE DATE / TIME: 202104282332
ISSUE DATE / TIME: 202104291322
Unit Type and Rh: 5100
Unit Type and Rh: 5100
Unit Type and Rh: 5100
Unit Type and Rh: 5100
Unit Type and Rh: 5100
Unit Type and Rh: 5100
Unit Type and Rh: 5100
Unit Type and Rh: 5100
Unit Type and Rh: 5100
Unit Type and Rh: 5100
Unit Type and Rh: 7300
Unit Type and Rh: 7300
Unit Type and Rh: 7300
Unit Type and Rh: 7300
Unit Type and Rh: 7300
Unit Type and Rh: 7300
Unit Type and Rh: 7300
Unit Type and Rh: 7300
Unit Type and Rh: 7300
Unit Type and Rh: 7300
Unit Type and Rh: 7300
Unit Type and Rh: 7300
Unit Type and Rh: 7300
Unit Type and Rh: 7300
Unit Type and Rh: 7300
Unit Type and Rh: 7300
Unit Type and Rh: 7300
Unit Type and Rh: 7300

## 2019-07-03 LAB — CBC
HCT: 42.2 % (ref 39.0–52.0)
HCT: 42.5 % (ref 39.0–52.0)
Hemoglobin: 13.9 g/dL (ref 13.0–17.0)
Hemoglobin: 13.9 g/dL (ref 13.0–17.0)
MCH: 28.8 pg (ref 26.0–34.0)
MCH: 29.1 pg (ref 26.0–34.0)
MCHC: 32.9 g/dL (ref 30.0–36.0)
MCHC: 32.7 g/dL (ref 30.0–36.0)
MCV: 87.4 fL (ref 80.0–100.0)
MCV: 88.9 fL (ref 80.0–100.0)
Platelets: 61 10*3/uL — ABNORMAL LOW (ref 150–400)
Platelets: 61 10*3/uL — ABNORMAL LOW (ref 150–400)
RBC: 4.83 MIL/uL (ref 4.22–5.81)
RBC: 4.78 MIL/uL (ref 4.22–5.81)
RDW: 15.7 % — ABNORMAL HIGH (ref 11.5–15.5)
RDW: 15.7 % — ABNORMAL HIGH (ref 11.5–15.5)
WBC: 11 10*3/uL — ABNORMAL HIGH (ref 4.0–10.5)
WBC: 11.5 10*3/uL — ABNORMAL HIGH (ref 4.0–10.5)
nRBC: 0 % (ref 0.0–0.2)
nRBC: 0 % (ref 0.0–0.2)

## 2019-07-03 LAB — GLUCOSE, CAPILLARY
Glucose-Capillary: 134 mg/dL — ABNORMAL HIGH (ref 70–99)
Glucose-Capillary: 131 mg/dL — ABNORMAL HIGH (ref 70–99)
Glucose-Capillary: 110 mg/dL — ABNORMAL HIGH (ref 70–99)
Glucose-Capillary: 120 mg/dL — ABNORMAL HIGH (ref 70–99)
Glucose-Capillary: 115 mg/dL — ABNORMAL HIGH (ref 70–99)
Glucose-Capillary: 124 mg/dL — ABNORMAL HIGH (ref 70–99)

## 2019-07-03 LAB — COMPREHENSIVE METABOLIC PANEL
ALT: 35 U/L (ref 0–44)
AST: 30 U/L (ref 15–41)
Albumin: 2.7 g/dL — ABNORMAL LOW (ref 3.5–5.0)
Alkaline Phosphatase: 34 U/L — ABNORMAL LOW (ref 38–126)
Anion gap: 7 (ref 5–15)
BUN: 15 mg/dL (ref 6–20)
CO2: 24 mmol/L (ref 22–32)
Calcium: 8.6 mg/dL — ABNORMAL LOW (ref 8.9–10.3)
Chloride: 127 mmol/L — ABNORMAL HIGH (ref 98–111)
Creatinine, Ser: 0.89 mg/dL (ref 0.61–1.24)
GFR calc Af Amer: >60 mL/min (ref >60)
GFR calc non Af Amer: >60 mL/min (ref >60)
Glucose, Bld: 158 mg/dL — ABNORMAL HIGH (ref 70–99)
Potassium: 3.7 mmol/L (ref 3.5–5.1)
Sodium: 158 mmol/L — ABNORMAL HIGH (ref 135–145)
Total Bilirubin: 0.6 mg/dL (ref 0.3–1.2)
Total Protein: 4.9 g/dL — ABNORMAL LOW (ref 6.5–8.1)

## 2019-07-03 LAB — CREATININE, SERUM
Creatinine, Ser: 1 mg/dL (ref 0.61–1.24)
GFR calc Af Amer: >60 mL/min (ref >60)
GFR calc non Af Amer: >60 mL/min (ref >60)

## 2019-07-03 LAB — SODIUM
Sodium: 158 mmol/L — ABNORMAL HIGH (ref 135–145)
Sodium: 158 mmol/L — ABNORMAL HIGH (ref 135–145)
Sodium: 158 mmol/L — ABNORMAL HIGH (ref 135–145)
Sodium: 159 mmol/L — ABNORMAL HIGH (ref 135–145)

## 2019-07-03 LAB — PHOSPHORUS
Phosphorus: 1.5 mg/dL — ABNORMAL LOW (ref 2.5–4.6)
Phosphorus: 2.5 mg/dL (ref 2.5–4.6)

## 2019-07-03 LAB — BPAM PLATELET PHERESIS
Blood Product Expiration Date: 202104292359
Blood Product Expiration Date: 202104292359
ISSUE DATE / TIME: 202104281736
ISSUE DATE / TIME: 202104281923
Unit Type and Rh: 5100
Unit Type and Rh: 6200

## 2019-07-03 LAB — PREPARE PLATELET PHERESIS
Unit division: 0
Unit division: 0

## 2019-07-03 LAB — MAGNESIUM
Magnesium: 2.1 mg/dL (ref 1.7–2.4)
Magnesium: 2.2 mg/dL (ref 1.7–2.4)

## 2019-07-03 LAB — TRIGLYCERIDES: Triglycerides: 137 mg/dL (ref <150)

## 2019-07-03 MED ORDER — NOREPINEPHRINE BITARTRATE 1 MG/ML IV SOLN
0.0000 ug/min | INTRAVENOUS | Status: DC
  Filled 2019-07-03: qty 4

## 2019-07-03 MED ORDER — SODIUM CHLORIDE 0.9 % IV SOLN
2.0000 g | Freq: Three times a day (TID) | INTRAVENOUS | Status: DC
  Administered 2019-07-03 – 2019-07-05 (×6): 2 g via INTRAVENOUS
  Filled 2019-07-03 (×8): qty 2

## 2019-07-03 MED ORDER — NOREPINEPHRINE-DEXTROSE 8-5 MG/500ML-% IV SOLN: 0.0000 ug/min | INTRAVENOUS | Status: DC

## 2019-07-03 MED ORDER — HEPARIN SODIUM (PORCINE) 5000 UNIT/ML IJ SOLN
5000.0000 [IU] | Freq: Three times a day (TID) | INTRAMUSCULAR | Status: DC
  Administered 2019-07-03 – 2019-07-11 (×24): 5000 [IU] via SUBCUTANEOUS
  Filled 2019-07-03 (×24): qty 1

## 2019-07-03 MED ORDER — SODIUM CHLORIDE 0.9% FLUSH: 10.0000 mL | INTRAVENOUS | Status: DC | PRN

## 2019-07-03 MED ORDER — POTASSIUM PHOSPHATES 15 MMOLE/5ML IV SOLN
30.0000 mmol | Freq: Once | INTRAVENOUS | Status: AC
  Administered 2019-07-03: 30 mmol via INTRAVENOUS
  Filled 2019-07-03: qty 10

## 2019-07-03 MED ORDER — SODIUM CHLORIDE 0.9% FLUSH
10.0000 mL | Freq: Two times a day (BID) | INTRAVENOUS | Status: DC
  Administered 2019-07-03 (×2): 20 mL
  Administered 2019-07-03 – 2019-07-04 (×2): 30 mL
  Administered 2019-07-04: 10 mL
  Administered 2019-07-05: 30 mL
  Administered 2019-07-06 (×2): 20 mL
  Administered 2019-07-06 – 2019-07-08 (×4): 10 mL

## 2019-07-03 MED ORDER — SODIUM CHLORIDE 0.9 % IV SOLN: INTRAVENOUS | Status: DC

## 2019-07-03 NOTE — Progress Notes (Signed)
OT Cancellation Note  Patient Details Name: Lee Hooper MRN: [id number] DOB: 12/05/1970   Cancelled Treatment:    Reason Eval/Treat Not Completed: Other (comment)(Vent settings at 70% FiO2. ) Will hold and return as schedule allows. Thank you.  Shameer Molstad M Araceli Coufal Nohelia Valenza MSOT, OTR/L Acute Rehab Pager: [phone] Office: [phone] 07/03/2019, 8:07 AM

## 2019-07-03 NOTE — Progress Notes (Signed)
NAME:  Lee Hooper, MRN:  [id number], DOB:  1970/06/02, LOS: 2 ADMISSION DATE:  07/01/2019, CONSULTATION DATE:  07/01/2019 REFERRING MD:  Georgiana Spinner Aroor, CHIEF COMPLAINT:  CVA   Brief History   Taitum Alms is a 49 yo M who presented as a code stroke after being found aphasic and nonverbal by his wife. CT/CTA studies showed left ICA/MCA thrombosis. Patient was admitted by Neurology and went for IR atherectomy, which achieved revascularization. He was intubated for airway protection. PCCM consulted for vent management.  History of present illness   49 yo male with COPD and psoriasis who presented to [facility] on 07/01/19 for right hemiplegia and aphasia. LKN at 11pm on evening prior to admission.  Daughter is primary historian. Remainder of HPI obtained from chart review. She notes that she resides with the pt and that she did hear him get up this morning around 0650 however found him in the restroom with right hemiplegia and expressive aphasia.  On arrival to the ED, head imaging revealed large ICA/MCA thrombosis and pt was taken for IR thrombectomy for left ICA and left M1 occlusions. Repeat head CT was obtained following the procedure out of concern for possible hemorrhage and showed moderate basal ganglia hemorrhage vs stroke. Repeat MRI revealed that the ICA and MCA re-occluded with change in FLAIR sequence excluding him from repeat thrombectomy.  Neurosurgery was consulted for decompressive hemicraniectomy. Past Medical History  Psoriasis COPD Tobacco Use  Significant Hospital Events   4/28: Admit; L ICA/MCA Thrombectomy with re-occlusion>left hemicraniectomy 4/29: bradycardic overnight  Consults:  IR PCCM Neuro (Primary) Neurosurgery  Procedures:  4/28 IR - L ICA/MCA Thrombectomy 4/28 L hemicraniectomy  Significant Diagnostic Tests:  4/28 Admit CT Head Code Stroke: Hyperdense left ICA and MCA compatible with acute thrombus. No acute infarct or hemorrhage.  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 The patient has minimal atherosclerotic disease. The left internal carotid artery is occluded through the terminus and extending in the left M1 M2 and M3 branches. There is poor collateral circulation on the left. There is a large territory infarct involving the left hemisphere. Infarct volume 266 mL.  4/28 Admit MR Brain: Restricted diffusion throughout much of the left MCA vascular territory consistent with acute ischemia. Restricted diffusion consistent with acute ischemia is also present within the paramedian left frontoparietal lobes, although this is less well assessed due to the degree of motion degradation at the level of the vertex. There is little if any corresponding T2/FLAIR hyperintensity at these sites. No significant mass effect.  No midline shift.  4/28 Post Thrombectomy CT Head: IMPRESSION: 1. 3.0 x 2.6 x 3.9 cm region of hyperdensity centered within the left basal ganglia, left subinsular region and inferomedial left temporal lobe likely reflecting a combination of parenchymal hematoma and contrast staining.  2. Edema with loss of gray-white differentiation within the left basal ganglia, left insula and anterior left temporal lobe, likely acute infarction. Subtle changes of acute infarction are also suspected within the paramedian left frontal lobe ACA vascular territory. 3. Scattered small-volume subarachnoid hemorrhage along the left cerebral hemisphere. 4. Regional mass effect with effacement of the left lateral ventricle temporal horn. No midline shift.  4/28 Post Thrombectomy MR Brain:  1. Restricted diffusion throughout the majority of the left MCA and ACA vascular territories consistent with acute ischemia. 2. No significant mass effect at this time. No midline shift. 3. The acute  parenchymal hemorrhage and/or contrast staining centered within left basal  ganglia and inferomedial left temporal lobe on prior head CT does not appear significantly changed. Based on the MR appearance, it is suspected that a significant component of this previously demonstrated hyperdensity reflects contrast staining. 4. Redemonstrated small volume subarachnoid hemorrhage overlying the left cerebral hemisphere. Small volume subarachnoid hemorrhage is also questioned along the right cerebral hemisphere posteriorly.  4/29 Post Hemicraniectomy CT head: complete left ACA/MCA territory infarction with swollen brain bulging through craniectomy defect. No midline shift or entrapment. Petechial hemorrhage at left basal ganglia. Extraaxial hemorrhage along surface of the infarct.   4/30 CXR: increasing opacification of R lower lung field. (personal interpretation) Micro Data:  4/28 Negative Coronavirus PCR  Antimicrobials:  None   Interim history/subjective:  Calmer overnight with Precedex.  Fever 38C overnight resolved with APAP. Increased sputum production and increased O2 requirements.  Objective   Blood pressure 107/72, pulse (!) 56, temperature 100.2 F (37.9 C), temperature source Axillary, resp. rate 20, height 6' (1.829 m), weight 114.5 kg, SpO2 97 %.    Vent Mode: PRVC FiO2 (%):  [70 %-100 %] 70 % Set Rate:  [16 bmp] 16 bmp Vt Set:  [620 mL] 620 mL PEEP:  [5 cmH20] 5 cmH20 Plateau Pressure:  [16 cmH20-19 cmH20] 19 cmH20   Intake/Output Summary (Last 24 hours) at 07/03/2019 0807 Last data filed at 07/03/2019 0600 Gross per 24 hour  Intake 2662.34 ml  Output 1150 ml  Net 1512.34 ml     Examination: General: Intubated, Sedated HENT: ETT in place. Dressing in place over head. Swelling over the left lid. Lungs: mechanial sounds bilaterally. No crackles appreciated. Occasional wheezes Cardiovascular: bradycardic rate. Regular rhythm. Extremities warm. No peripheral edema Abdomen: Obese abdomen, decreased bowl sounds Extremities: warm Neuro: Sedated.  Difficulty assessing pupillary function on the left due to swelling. Right pupil reactive to light. Purposeful with normal strength on the left, but remains hemiplegic on right.   Resolved Hospital Problem list   n/a  Assessment & Plan:   Critically ill due to acute hypoxic respiratory failure requiring mechanical ventilation Increasing oxygen requirements - Continue full ventilatory support through acute edema window POD 4 - Initiate empiric HCAP coverage pending culture results - Given that patient in purposeful on left and has strong cough, may not require tracheostomy.   Left ICA/MCA CVA Post op day #2 of left decompressive hemicraniectomy Exam unchanged  - Obtain best exam off sedation. - Stop 3% saline and allow Na to drift down. - Secondary stroke prevention per Neurology.   Daily Goals Checklist  Pain/Anxiety/Delirium protocol (if indicated): Precedex  -  Neuro vitals: every 2 hours AED's: Keppra prophylaxis. VAP protocol (if indicated): Bundle in place Respiratory support goals: Full ventilator support  Blood pressure target: 100-160 DVT prophylaxis: SCD's  Nutrition Status: moderate nutritional risk. Tube feeds at goal. Will obtain Cortrak. GI prophylaxis: Pantoprazole Fluid status goals: Allow autoregulation. Urinary catheter: Will switch to condom catheter Central lines: Right subclavian Glucose control: adequate glycemic control on SSI Mobility/therapy needs: Bedrest. Antibiotic de-escalation: Starting cefepime. Home medication reconciliation: None relevant Daily labs: CBC, CMP Code Status: Full Family Communication: Wife updated yesterday.  Disposition: ICU   Labs   CBC: Recent Labs  Lab 07/01/19 0759 07/01/19 0804 07/01/19 1821 07/01/19 1834 07/01/19 1930 07/01/19 1930 07/01/19 2032 07/02/19 0759 07/02/19 0806 07/02/19 2308 07/03/19 0500  WBC 6.6  --   --   --  11.5*  --   --  11.0*  --   --  11.0*  NEUTROABS 4.3  --   --   --   --   --   --   9.5*  --   --   --   HGB 15.9   < > 10.1*   < > 14.9   < > 13.9 14.9 14.3 12.6* 13.9  HCT 48.3   < > 29.6*   < > 44.2   < > 41.0 44.0 42.0 37.0* 42.2  MCV 92.2  --   --   --  86.7  --   --  84.1  --   --  87.4  PLT 166  --  55*  --  41*  --   --  51*  --   --  61*   < > = values in this interval not displayed.    Basic Metabolic Panel: Recent Labs  Lab 07/01/19 0759 07/01/19 0759 07/01/19 0804 07/01/19 1629 07/01/19 2009 07/01/19 2009 07/01/19 2032 07/02/19 0151 07/02/19 0759 07/02/19 0759 07/02/19 0806 07/02/19 0806 07/02/19 1430 07/02/19 1832 07/02/19 1951 07/02/19 2308 07/03/19 0151 07/03/19 0500  NA 139   < > 139   < > 142   < > 143   < > 149*   < > 149*   < > 150*  --  154* 157* 158* 158*  K 4.1   < > 3.9   < > 3.7   < > 3.6  --  4.2  --  4.1  --   --   --   --  3.6  --  3.7  CL 108  --  105  --  110  --   --   --  118*  --   --   --   --   --   --   --   --  127*  CO2 25  --   --   --  22  --   --   --  23  --   --   --   --   --   --   --   --  24  GLUCOSE 160*  --  151*  --  178*  --   --   --  160*  --   --   --   --   --   --   --   --  158*  BUN 7  --  7  --  6  --   --   --  8  --   --   --   --   --   --   --   --  15  CREATININE 1.07  --  1.00  --  0.97  --   --   --  0.97  --   --   --   --   --   --   --   --  0.89  CALCIUM 8.6*  --   --   --  8.7*  --   --   --  8.4*  --   --   --   --   --   --   --   --  8.6*  MG  --   --   --   --   --   --   --   --  1.8  --   --   --   --  1.7  --   --   --  2.1  PHOS  --   --   --   --   --   --   --   --  2.3*  --   --   --   --  1.6*  --   --   --  1.5*   < > = values in this interval not displayed.   GFR: Estimated Creatinine Clearance: 129.8 mL/min (by C-G formula based on SCr of 0.89 mg/dL). Recent Labs  Lab 07/01/19 0759 07/01/19 1930 07/02/19 0759 07/03/19 0500  WBC 6.6 11.5* 11.0* 11.0*    Liver Function Tests: Recent Labs  Lab 07/01/19 0759 07/01/19 2009 07/02/19 0759 07/03/19 0500  AST 22 39  28 30  ALT 27 40 34 35  ALKPHOS 50 40 34* 34*  BILITOT 0.6 2.7* 0.8 0.6  PROT 5.8* 5.2* 5.1* 4.9*  ALBUMIN 3.4* 3.2* 3.0* 2.7*   No results for input(s): LIPASE, AMYLASE in the last 168 hours. No results for input(s): AMMONIA in the last 168 hours.  ABG    Component Value Date/Time   PHART 7.377 07/02/2019 2308   PCO2ART 43.4 07/02/2019 2308   PO2ART 91 07/02/2019 2308   HCO3 25.4 07/02/2019 2308   TCO2 27 07/02/2019 2308   ACIDBASEDEF 3.0 (H) 07/02/2019 0806   O2SAT 97.0 07/02/2019 2308     Coagulation Profile: Recent Labs  Lab 07/01/19 0759 07/01/19 1821  INR 1.0 1.4*    Cardiac Enzymes: No results for input(s): CKTOTAL, CKMB, CKMBINDEX, TROPONINI in the last 168 hours.  HbA1C: Hgb A1c MFr Bld  Date/Time Value Ref Range Status  07/02/2019 04:31 AM 5.5 4.8 - 5.6 % Final    Comment:    (NOTE) Pre diabetes:          5.7%-6.4% Diabetes:              >6.4% Glycemic control for   <7.0% adults with diabetes     CRITICAL CARE Performed by: Lynnell Catalan   Total critical care time: 40 minutes  Critical care time was exclusive of separately billable procedures and treating other patients.  Critical care was necessary to treat or prevent imminent or life-threatening deterioration.  Critical care was time spent personally by me on the following activities: development of treatment plan with patient and/or surrogate as well as nursing, discussions with consultants, evaluation of patient's response to treatment, examination of patient, obtaining history from patient or surrogate, ordering and performing treatments and interventions, ordering and review of laboratory studies, ordering and review of radiographic studies, pulse oximetry, re-evaluation of patient's condition and participation in multidisciplinary rounds.  Lynnell Catalan, MD [facility] ICU Physician [facility] [place] [facility]  Pager: [phone] Mobile: [phone] After hours: [phone].

## 2019-07-03 NOTE — Progress Notes (Signed)
Pharmacy Antibiotic Note  Lee Hooper is a 49 y.o. male admitted on 07/01/2019 with pneumonia.  Pharmacy has been consulted for Cefepime dosing. Pt has acute hypoxic respiratory failure requiring mechanical ventilation. Pt has strong cough and CXR shows new consolidation in right lower lobe and left infrahilar region concerning for HCAP. Temp max 100.4. WBC 11.0.   Plan: Cefepime 2g IV q8h   Monitor renal function, clinical progression, length of therapy, and follow up with cultures.   Height: 6' (182.9 cm) Weight: 114.5 kg (252 lb 6.8 oz) IBW/kg (Calculated) : 77.6  Temp (24hrs), Avg:99.8 F (37.7 C), Min:98.9 F (37.2 C), Max:100.4 F (38 C)  Recent Labs  Lab 07/01/19 0759 07/01/19 0804 07/01/19 1930 07/01/19 2009 07/02/19 0759 07/03/19 0500  WBC 6.6  --  11.5*  --  11.0* 11.0*  CREATININE 1.07 1.00  --  0.97 0.97 0.89    Estimated Creatinine Clearance: 129.8 mL/min (by C-G formula based on SCr of 0.89 mg/dL).    No Known Allergies  Antimicrobials this admission: Cefepime 4/30 >>   Microbiology results: 4/30 Resp Cx: pend  4/28 COVID PCR: neg  4/28 Influenza PCR: neg  4/28 MRSA PCR: neg  Thank you for allowing pharmacy to be a part of this patient's care.  Renella Cunas Shaquill Iseman 07/03/2019 10:29 AM

## 2019-07-03 NOTE — Progress Notes (Signed)
PT Cancellation Note  Patient Details Name: Lee Hooper MRN: [id number] DOB: September 08, 1970   Cancelled Treatment:    Reason Eval/Treat Not Completed: Medical issues which prohibited therapy. Pt on vent at 70% FiO2, PT will hold until respiratory status is more stable.   Arlyss Gandy 07/03/2019, 8:07 AM

## 2019-07-03 NOTE — Progress Notes (Signed)
STROKE TEAM PROGRESS NOTE   INTERVAL HISTORY    Patient is less responsive today but can be aroused with some stimulation and is purposeful left-sided movement.  Does not withdraw the right side which remains plegic.  There is trace withdrawal in the right leg to noxious stimuli.  His blood pressure is adequately controlled.  Serum sodium is above goal and 3% drip is on hold.  He had temperature spike and cultures have been sent.  Tube feeds have been started.  No family at the bedside.  He remains critically ill and is on ventilatory support for respiratory failure Vitals:   07/03/19 1000 07/03/19 1100 07/03/19 1200 07/03/19 1300  BP: 130/78 (!) 138/98 119/74 124/77  Pulse: 98 83 60 60  Resp: (!) 26 (!) 24 (!) 26 (!) 24  Temp:   99.1 F (37.3 C)   TempSrc:   Axillary   SpO2: 94% 91% 94% 98%  Weight:      Height:        CBC:  Recent Labs  Lab 07/01/19 0759 07/01/19 0804 07/02/19 0759 07/02/19 0806 07/03/19 0500 07/03/19 0900  WBC 6.6   < > 11.0*   < > 11.0* 11.5*  NEUTROABS 4.3  --  9.5*  --   --   --   HGB 15.9   < > 14.9   < > 13.9 13.9  HCT 48.3   < > 44.0   < > 42.2 42.5  MCV 92.2   < > 84.1   < > 87.4 88.9  PLT 166   < > 51*   < > 61* 61*   < > = values in this interval not displayed.    Basic Metabolic Panel:  Recent Labs  Lab 07/02/19 0759 07/02/19 0806 07/02/19 1832 07/02/19 1951 07/02/19 2308 07/03/19 0151 07/03/19 0500 07/03/19 0900  NA 149*   < >  --    < > 157*   < > 158* 158*  K 4.2   < >  --   --  3.6  --  3.7  --   CL 118*  --   --   --   --   --  127*  --   CO2 23  --   --   --   --   --  24  --   GLUCOSE 160*  --   --   --   --   --  158*  --   BUN 8  --   --   --   --   --  15  --   CREATININE 0.97   < >  --   --   --   --  0.89 1.00  CALCIUM 8.4*  --   --   --   --   --  8.6*  --   MG 1.8   < > 1.7  --   --   --  2.1  --   PHOS 2.3*   < > 1.6*  --   --   --  1.5*  --    < > = values in this interval not displayed.   Lipid Panel:      Component Value Date/Time   CHOL 92 07/02/2019 0431   TRIG 137 07/03/2019 0500   HDL 30 (L) 07/02/2019 0431   CHOLHDL 3.1 07/02/2019 0431   VLDL 17 07/02/2019 0431   LDLCALC 45 07/02/2019 0431   HgbA1c:  Lab Results  Component Value  Date   HGBA1C 5.5 07/02/2019   Urine Drug Screen:     Component Value Date/Time   LABOPIA NONE DETECTED 07/01/2019 1930   COCAINSCRNUR NONE DETECTED 07/01/2019 1930   LABBENZ POSITIVE (A) 07/01/2019 1930   AMPHETMU NONE DETECTED 07/01/2019 1930   THCU NONE DETECTED 07/01/2019 1930   LABBARB NONE DETECTED 07/01/2019 1930    Alcohol Level     Component Value Date/Time   ETH <10 07/01/2019 0759    IMAGING past 24 hours DG CHEST PORT 1 VIEW  Result Date: 07/03/2019 CLINICAL DATA:  Screening for pneumonia EXAM: PORTABLE CHEST 1 VIEW COMPARISON:  07/02/2019 FINDINGS: Support devices are stable. Worsening airspace disease in the right lower lobe lung concerning for pneumonia. Minimal left infrahilar opacity also noted. Possible layering small right effusion. No effusion on the left. No acute bony abnormality. Heart is normal size. IMPRESSION: New consolidation in the right lower lobe and left infrahilar region concerning for pneumonia. Possible small layering right effusion. Electronically Signed   By: Charlett Nose M.D.   On: 07/03/2019 08:56    PHYSICAL EXAM Obese middle-aged Caucasian male who is sedated and intubated.  He has post craniotomy bandage on his head. . Afebrile. Head is nontraumatic. Neck is supple without bruit.    Cardiac exam no murmur or gallop. Lungs are clear to auscultation. Distal pulses are well felt. Neurological Exam : Patient is  intubated on mechanical ventilation.  His eyes are closed.  There is mechanical ptosis from swelling around the left eye.  Eyes in primary position with some spontaneous roving side-to-side eye movements.  Pupils 2 mm reactive.  Corneal reflexes are present.  Does not blink to threat on either side.   Right lower facial weakness.  Tongue midline.  Is he does move to noxious stimuli left upper and lower extremity movements with good tone against gravity.  Right flaccid hemiplegia with only trace withdrawal in the leg to noxious stimuli and not in the right upper extremity.  Right plantar upgoing left downgoing.  ASSESSMENT/PLAN [name] with history of COPD, ongoing tobacco use, psoriasis, presenting with dense LMCA syndrome. Attempt for Left ICA and Left M1 thrombectomy was done emergently with TICI 2b flow. Unfortunately, the pt had post procedure hemorrhage and reocclusion of the vessels. F/u imaging suggested early edema and pt underwent prophylactic decompressive hemicraniectomy.  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  Stroke etiology unknown at this time code Stroke CT head showed early hypodensity in nearly entire LMCA. ASPECTS 6-7 per notes.  CTA head & neck occlusion cervical internal carotid artery on the left.  This is most likely due to dissection. The patient has minimal atherosclerotic disease.  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  There is poor collateral circulation on the left.  There is a large territory infarct involving the left hemisphere with volume approximately 266 mL.  CT perfusion CBF less than 30% with volume of 266 mL, perfusion volume 313 mL, mismatch volume 47 mL.  ASPECTS  using CTP by Dr. Chestine Spore 6 or 7.   MRI  severely motion degraded diffusion-imaging.   Restricted diffusion throughout much of the left MCA vascular territory consistent with acute ischemia.  Restricted diffusion consistent with acute ischemia present within the paramedian left frontal parietal lobes.  There is little if any corresponding T2/flair hyperintensity at the sites.  2D Echo pending  LDL 45  HgbA1c 5.5  SCDs d/t bleed for VTE prophylaxis    Diet   Diet NPO time specified    none prior to admission, now on none d/t bleed  Therapy recommendations:  pending  Disposition:  pending  Hypertension  Home meds:  none  Unstable . Permissive hypertension (OK if < 180/105) but gradually normalize in 5-7 days . Long-term BP goal normotensive  Hyperlipidemia  Home meds:  none  LDL 45, goal < 70  No statin indicated at this time  No h/o Diabetes type II  HgbA1c 5.5, goal < 7.0  CBGs Recent Labs    07/03/19 0327 07/03/19 0750 07/03/19 1120  GLUCAP 134* 131* 110*      SSI  Other Stroke Risk Factors  Cigarette smoker; unable to currently advise to stop smoking  Obesity, Body mass index is 34.24 kg/m., recommend weight loss, diet and exercise as appropriate   Other Active Problems  Acute Respiratory Failure- Vented  Clinically significant Cerebral edema and brain compression- s/p Craniectomy and currently with hypertonic Na  Central Line placement today by CCM team  Patient presented with terminal left ICA and M1 occlusion outside TPA window and underwent successful revascularization unfortunately followed by reocclusion with a massive infarct size and edema requiring early decompressive hemicraniectomy.  . He seems to have developed pneumonia on his chest x-ray and CCM team sending cultures and starting antibiotics.  Continue hypertonic's saline with serum sodium goal 1 50-155 and keep normothermic, normovolemic and normoglycemic.  He will require prolonged ventilatory support and treatment for cytotoxic edema of the next several days.     Strict blood pressure control with systolic   160.  Keep normothermic, euglycemic and euvolemic.  Long discussion with patient's wife over the phone about his condition, prognosis and plan of care and answered questions.  Discussed with Dr. Denese Killings in critical care team. This patient is critically ill and at significant risk of neurological worsening, death and care requires constant monitoring of vital signs, hemodynamics,respiratory and cardiac monitoring, extensive review of multiple databases, frequent neurological assessment, discussion with family, other specialists and medical decision making of high complexity.I have made any additions or clarifications directly to the above note.This critical care time does not reflect procedure time, or teaching time or supervisory time of PA/NP/Med Resident etc but could involve care discussion time.  I spent 35 minutes of neurocritical care time  in the care of  this patient.     Delia Heady, MD Medical Director [facility] Pager: [phone] 07/03/2019 2:08 PM  To contact Stroke Continuity provider, please refer to [email or web address]. After hours, contact General Neurology

## 2019-07-03 NOTE — Procedures (Signed)
Cortrak ° °Tube Type:  Cortrak - 43 inches °Tube Location:  Left nare °Initial Placement:  Stomach °Secured by: Bridle °Technique Used to Measure Tube Placement:  Documented cm marking at nare/ corner of mouth °Cortrak Secured At:  70 cm ° ° ° °Cortrak Tube Team Note: ° °Consult received to place a Cortrak feeding tube.  ° °No x-ray is required. RN may begin using tube.  ° °If the tube becomes dislodged please keep the tube and contact the Cortrak team at [email or web address] (password [email or web address]) for replacement.  °If after hours and replacement cannot be delayed, place a NG tube and confirm placement with an abdominal x-ray.  ° ° °Nolton Denis MS, RD, LDN °Please refer to AMION for RD and/or RD on-call/weekend/after hours pager ° °

## 2019-07-03 NOTE — Progress Notes (Signed)
Patient ID: Lee Hooper, male   DOB: 1970-11-10, 49 y.o.   MRN: [id number] BP 129/79 (BP Location: Right Arm)   Pulse 63   Temp 99.8 F (37.7 C) (Axillary)   Resp (!) 26   Ht 6' (1.829 m)   Wt 114.5 kg   SpO2 95%   BMI 34.24 kg/m  Lethargic, not following commands(aphasic undoubtedly) Localizing on the left Dressing is dry and intact No new recommendations, treatment per stroke team.

## 2019-07-04 ENCOUNTER — Inpatient Hospital Stay (HOSPITAL_COMMUNITY): Payer: Medicaid Other

## 2019-07-04 ENCOUNTER — Encounter: Payer: Self-pay | Admitting: Anesthesiology

## 2019-07-04 ENCOUNTER — Other Ambulatory Visit: Payer: Self-pay

## 2019-07-04 DIAGNOSIS — F17200 Nicotine dependence, unspecified, uncomplicated: Secondary | ICD-10-CM

## 2019-07-04 DIAGNOSIS — J9600 Acute respiratory failure, unspecified whether with hypoxia or hypercapnia: Secondary | ICD-10-CM | POA: Insufficient documentation

## 2019-07-04 DIAGNOSIS — R1312 Dysphagia, oropharyngeal phase: Secondary | ICD-10-CM

## 2019-07-04 DIAGNOSIS — Z9889 Other specified postprocedural states: Secondary | ICD-10-CM

## 2019-07-04 DIAGNOSIS — J9601 Acute respiratory failure with hypoxia: Secondary | ICD-10-CM

## 2019-07-04 DIAGNOSIS — I6389 Other cerebral infarction: Secondary | ICD-10-CM

## 2019-07-04 DIAGNOSIS — E876 Hypokalemia: Secondary | ICD-10-CM

## 2019-07-04 DIAGNOSIS — I63232 Cerebral infarction due to unspecified occlusion or stenosis of left carotid arteries: Secondary | ICD-10-CM

## 2019-07-04 DIAGNOSIS — J9602 Acute respiratory failure with hypercapnia: Secondary | ICD-10-CM

## 2019-07-04 DIAGNOSIS — G936 Cerebral edema: Secondary | ICD-10-CM

## 2019-07-04 LAB — COMPREHENSIVE METABOLIC PANEL
ALT: 29 U/L (ref 0–44)
AST: 27 U/L (ref 15–41)
Albumin: 2.6 g/dL — ABNORMAL LOW (ref 3.5–5.0)
Alkaline Phosphatase: 36 U/L — ABNORMAL LOW (ref 38–126)
Anion gap: 6 (ref 5–15)
BUN: 19 mg/dL (ref 6–20)
CO2: 27 mmol/L (ref 22–32)
Calcium: 9.1 mg/dL (ref 8.9–10.3)
Chloride: 125 mmol/L — ABNORMAL HIGH (ref 98–111)
Creatinine, Ser: 1.01 mg/dL (ref 0.61–1.24)
GFR calc Af Amer: >60 mL/min (ref >60)
GFR calc non Af Amer: >60 mL/min (ref >60)
Glucose, Bld: 132 mg/dL — ABNORMAL HIGH (ref 70–99)
Potassium: 3.3 mmol/L — ABNORMAL LOW (ref 3.5–5.1)
Sodium: 158 mmol/L — ABNORMAL HIGH (ref 135–145)
Total Bilirubin: 0.8 mg/dL (ref 0.3–1.2)
Total Protein: 5.4 g/dL — ABNORMAL LOW (ref 6.5–8.1)

## 2019-07-04 LAB — GLUCOSE, CAPILLARY
Glucose-Capillary: 111 mg/dL — ABNORMAL HIGH (ref 70–99)
Glucose-Capillary: 115 mg/dL — ABNORMAL HIGH (ref 70–99)
Glucose-Capillary: 115 mg/dL — ABNORMAL HIGH (ref 70–99)
Glucose-Capillary: 129 mg/dL — ABNORMAL HIGH (ref 70–99)
Glucose-Capillary: 139 mg/dL — ABNORMAL HIGH (ref 70–99)
Glucose-Capillary: 97 mg/dL (ref 70–99)

## 2019-07-04 LAB — CBC
HCT: 42.6 % (ref 39.0–52.0)
Hemoglobin: 13.7 g/dL (ref 13.0–17.0)
MCH: 28.4 pg (ref 26.0–34.0)
MCHC: 32.2 g/dL (ref 30.0–36.0)
MCV: 88.2 fL (ref 80.0–100.0)
Platelets: 68 10*3/uL — ABNORMAL LOW (ref 150–400)
RBC: 4.83 MIL/uL (ref 4.22–5.81)
RDW: 15.1 % (ref 11.5–15.5)
WBC: 11.1 10*3/uL — ABNORMAL HIGH (ref 4.0–10.5)
nRBC: 0 % (ref 0.0–0.2)

## 2019-07-04 LAB — ECHOCARDIOGRAM COMPLETE
Height: 72 in
Weight: 4031.77 oz

## 2019-07-04 LAB — SODIUM
Sodium: 158 mmol/L — ABNORMAL HIGH (ref 135–145)
Sodium: 159 mmol/L — ABNORMAL HIGH (ref 135–145)
Sodium: 160 mmol/L — ABNORMAL HIGH (ref 135–145)
Sodium: 159 mmol/L — ABNORMAL HIGH (ref 135–145)

## 2019-07-04 MED ORDER — FREE WATER: 200.0000 mL | Freq: Four times a day (QID) | Status: DC

## 2019-07-04 MED ORDER — SODIUM CHLORIDE 0.45 % IV SOLN: INTRAVENOUS | Status: DC

## 2019-07-04 MED ORDER — PERFLUTREN LIPID MICROSPHERE
1.0000 mL | INTRAVENOUS | Status: DC | PRN
  Administered 2019-07-04: 6 mL via INTRAVENOUS
  Filled 2019-07-04: qty 10

## 2019-07-04 MED ORDER — IPRATROPIUM-ALBUTEROL 0.5-2.5 (3) MG/3ML IN SOLN
3.0000 mL | Freq: Four times a day (QID) | RESPIRATORY_TRACT | Status: DC | PRN
  Administered 2019-07-04 – 2019-07-12 (×3): 3 mL via RESPIRATORY_TRACT
  Filled 2019-07-04 (×3): qty 3

## 2019-07-04 MED ORDER — LEVETIRACETAM 100 MG/ML PO SOLN
500.0000 mg | Freq: Two times a day (BID) | ORAL | Status: DC
  Administered 2019-07-04 – 2019-07-06 (×5): 500 mg
  Filled 2019-07-04 (×5): qty 5

## 2019-07-04 MED ORDER — POTASSIUM CHLORIDE 20 MEQ/15ML (10%) PO SOLN
40.0000 meq | Freq: Three times a day (TID) | ORAL | Status: AC
  Administered 2019-07-04 (×3): 40 meq
  Filled 2019-07-04 (×3): qty 30

## 2019-07-04 MED ORDER — ADULT MULTIVITAMIN LIQUID CH
15.0000 mL | Freq: Every day | ORAL | Status: DC
  Administered 2019-07-05 – 2019-08-28 (×53): 15 mL
  Filled 2019-07-04 (×55): qty 15

## 2019-07-04 MED ORDER — BUDESONIDE 0.25 MG/2ML IN SUSP
0.2500 mg | Freq: Two times a day (BID) | RESPIRATORY_TRACT | Status: DC
  Administered 2019-07-04 – 2019-07-13 (×19): 0.25 mg via RESPIRATORY_TRACT
  Filled 2019-07-04 (×19): qty 2

## 2019-07-04 NOTE — Progress Notes (Signed)
STROKE TEAM PROGRESS NOTE   INTERVAL HISTORY RN at bedside, no family at bedside. Pt still intubated on vent. Unresponsive. Not open eyes on pain or voice. Purposeful movement of left UE and LE on pain stimulation. Right hemiplegia. On TF and NS. Na 158 this am. Repeat CT showed no MLS, left transcalvarial herniation.    Vitals:   07/04/19 0400 07/04/19 0500 07/04/19 0600 07/04/19 0700  BP: 122/85 124/84 112/74 113/75  Pulse: (!) 56 (!) 51 61 (!) 55  Resp: (!) 21 (!) 22 (!) 23 (!) 22  Temp: 98.9 F (37.2 C)     TempSrc: Axillary     SpO2: 96% 93% 92% 91%  Weight:  114.3 kg    Height:        CBC:  Recent Labs  Lab 07/01/19 0759 07/01/19 0804 07/02/19 0759 07/02/19 0806 07/03/19 0900 07/04/19 0500  WBC 6.6   < > 11.0*   < > 11.5* 11.1*  NEUTROABS 4.3  --  9.5*  --   --   --   HGB 15.9   < > 14.9   < > 13.9 13.7  HCT 48.3   < > 44.0   < > 42.5 42.6  MCV 92.2   < > 84.1   < > 88.9 88.2  PLT 166   < > 51*   < > 61* 68*   < > = values in this interval not displayed.    Basic Metabolic Panel:  Recent Labs  Lab 07/03/19 0500 07/03/19 0500 07/03/19 0900 07/03/19 1351 07/03/19 1708 07/03/19 1951 07/04/19 0151 07/04/19 0500  NA 158*   < > 158*   < >  --    < > 158* 158*  K 3.7  --   --   --   --   --   --  3.3*  CL 127*  --   --   --   --   --   --  125*  CO2 24  --   --   --   --   --   --  27  GLUCOSE 158*  --   --   --   --   --   --  132*  BUN 15  --   --   --   --   --   --  19  CREATININE 0.89   < > 1.00  --   --   --   --  1.01  CALCIUM 8.6*  --   --   --   --   --   --  9.1  MG 2.1  --   --   --  2.2  --   --   --   PHOS 1.5*  --   --   --  2.5  --   --   --    < > = values in this interval not displayed.   Lipid Panel:     Component Value Date/Time   CHOL 92 07/02/2019 0431   TRIG 137 07/03/2019 0500   HDL 30 (L) 07/02/2019 0431   CHOLHDL 3.1 07/02/2019 0431   VLDL 17 07/02/2019 0431   LDLCALC 45 07/02/2019 0431   HgbA1c:  Lab Results  Component  Value Date   HGBA1C 5.5 07/02/2019   Urine Drug Screen:     Component Value Date/Time   LABOPIA NONE DETECTED 07/01/2019 1930   COCAINSCRNUR NONE DETECTED 07/01/2019 1930   LABBENZ POSITIVE (A) 07/01/2019 1930  AMPHETMU NONE DETECTED 07/01/2019 1930   THCU NONE DETECTED 07/01/2019 1930   LABBARB NONE DETECTED 07/01/2019 1930    Alcohol Level     Component Value Date/Time   ETH <10 07/01/2019 0759    IMAGING past 24 hours  CT HEAD WO CONTRAST  Result Date: 07/04/2019 CLINICAL DATA:  Stroke. Postop. EXAM: CT HEAD WITHOUT CONTRAST TECHNIQUE: Contiguous axial images were obtained from the base of the skull through the vertex without intravenous contrast. COMPARISON:  Head CT 07/02/2019 FINDINGS: Brain: Complete infarction of the left anterior and middle cerebral artery territories. There is a small amount of rightward subfalcine herniation of the left frontal lobe, increased from the prior exam. Persistent herniation through the craniectomy defect. Large amount of heterogeneous material at the superior and posterior aspect of the craniectomy flap, unchanged. Persistent subarachnoid subdural blood over the left hemisphere. Hyperdensity at the left basal ganglia as slightly disc patent. Vascular: Unchanged Skull: Unchanged large left craniectomy defect Sinuses/Orbits: Unchanged Other: None. IMPRESSION: Slightly increased rightward midline shift with a small amount of herniation beneath the anterior falx. Otherwise unchanged examination. Electronically Signed   By: Deatra Robinson M.D.   On: 07/04/2019 06:08   DG CHEST PORT 1 VIEW  Result Date: 07/03/2019 CLINICAL DATA:  Screening for pneumonia EXAM: PORTABLE CHEST 1 VIEW COMPARISON:  07/02/2019 FINDINGS: Support devices are stable. Worsening airspace disease in the right lower lobe lung concerning for pneumonia. Minimal left infrahilar opacity also noted. Possible layering small right effusion. No effusion on the left. No acute bony abnormality.  Heart is normal size. IMPRESSION: New consolidation in the right lower lobe and left infrahilar region concerning for pneumonia. Possible small layering right effusion. Electronically Signed   By: Charlett Nose M.D.   On: 07/03/2019 08:56    PHYSICAL EXAM  Temp:  [98.9 F (37.2 C)-100.8 F (38.2 C)] 100.3 F (37.9 C) (05/01 0800) Pulse Rate:  [51-98] 55 (05/01 0800) Resp:  [21-30] 22 (05/01 0800) BP: (110-138)/(63-98) 116/76 (05/01 0800) SpO2:  [91 %-98 %] 96 % (05/01 0852) FiO2 (%):  [50 %-60 %] 60 % (05/01 0852) Weight:  [114.3 kg] 114.3 kg (05/01 0500)  General - Well nourished, well developed, intubated on precedex. Dressing around head.   Ophthalmologic - fundi not visualized due to noncooperation.  Cardiovascular - Regular rate and rhythm, intermittent brady.  Neuro - intubated on precedex, eyes closed, not following commands. With forced eye opening, eyes in mid position, mildly disconjugate with left eye upward deviation, not blinking to visual threat, doll's eyes present, not tracking, PERRL but sluggish light reflex. Corneal reflex absent on the right but present on the left, gag and cough present. Breathing over the vent.  Facial symmetry not able to test due to ET tube.  Tongue protrusion not cooperative. On pain stimulation, left UE and LE 3-/5 with purposeful movement and localizing to pain. However, right UE and LE plegic. DTR 1+ and no babinski. Sensation, coordination and gait not tested.    ASSESSMENT/PLAN Lee Hooper is a 49 y.o. male with history of COPD, ongoing tobacco use, and psoriasis, presenting with dense LMCA syndrome. Attempt for Left ICA and Left M1 thrombectomy was done emergently with TICI 2b flow. Unfortunately, the pt had post procedure hemorrhage and reocclusion of the vessels. F/u imaging suggested early edema and pt underwent prophylactic decompressive hemicraniectomy.  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  Stroke etiology unknown  Code Stroke CT head showed early hypodensity in nearly entire LMCA. ASPECTS 6-7 per notes.  CTA head & neck occlusion cervical ICA on the left. This is most likely due to dissection. The patient has minimal atherosclerotic disease.  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  There is poor collateral circulation on the left.   CTP CBF large core volume of 266 mL, perfusion volume 313 mL, mismatch volume 47 mL.  ASPECTS using CTP by Dr. Carlis Abbott 6 or 7.   IR - TICI2c revascularization  MRI  Restricted diffusion throughout the majority of the left MCA and ACA vascular territories consistent with acute ischemia.  MRA - reocclusion of the intracranial left internal carotid artery. No flow related signal is seen within the left middle cerebral artery.  CT Head 07/04/19 - Slightly increased rightward midline shift with a small amount of herniation beneath the anterior falx. Otherwise unchanged examination.  2D Echo pending  LDL 45  HgbA1c 5.5  SCDs d/t bleed for VTE prophylaxis - [place] Heparin started 07/03/19  none prior to admission, now on none d/t hemorrhagic conversion  Therapy recommendations:  pending  Disposition:  pending  Respiratory failure  Intubated on vent  CCM on board  CXR 07/03/19- New consolidation in the right lower lobe and left infrahilar region concerning for pneumonia. Possible small layering right effusion.  On cefepime  Cerebral edema  MRI  Restricted diffusion throughout the majority of the left MCA and ACA vascular territories consistent with acute ischemia.  CT Head 07/04/19 - Slightly increased rightward midline shift with a small amount of herniation  beneath the anterior falx. Otherwise unchanged examination.  S/p [facility] 4/28  On keppra  Na 158->159  Goal Na 155-160  On TF @ 25 and NS @ 40  Na Q6h  Fever   Tmax 100.4->100.8  Leukocytosis - 6.6->11.0->11.5->11.1   CXR 07/03/19- New consolidation in the right lower lobe and left infrahilar region concerning for pneumonia. Possible small layering right effusion.  On cefepime  Hypertension  Home meds:  None  Current meds: Normodyne prn ; Levophed prn  stable  SBP goal < 160 due to hemorrhagic conversion . Long-term BP goal normotensive  Dysphagia   NPO  On TF @ 25  Dietitian on board  Tobacco abuse  Current smoker  Smoking cessation counseling will be provided if able  Other Stroke Risk Factors  Obesity, Body mass index is 34.18 kg/m., recommend weight loss, diet and exercise as appropriate   Other Active Problems  Code Status - Full code  Hypokalemia - 3.3 -> supplement and rechecck  Bradycardia - mild  Thrombocytopenia - 166->51->61->68 - monitor  This patient is critically ill and at significant risk of neurological worsening, death and care requires constant monitoring of vital signs, hemodynamics,respiratory and cardiac monitoring, extensive review of multiple databases, frequent neurological assessment, discussion with family, other specialists and medical decision making of high complexity. I spent 35 minutes of neurocritical care time  in the care of  this patient. Discussed with Dr. Mellody Drown.   Rosalin Hawking, MD PhD Stroke Neurology 07/04/2019 10:35 AM   To contact Stroke Continuity provider, please refer to [email or web address] After hours, contact General Neurology

## 2019-07-04 NOTE — Progress Notes (Signed)
Pt transported from 4N22 to CT and back with no complications.  

## 2019-07-04 NOTE — Progress Notes (Signed)
Neurosurgery Service Progress Note  Subjective: No acute events overnight   Objective: Vitals:   07/04/19 0600 07/04/19 0700 07/04/19 0800 07/04/19 0852  BP: 112/74 113/75 116/76   Pulse: 61 (!) 55 (!) 55   Resp: (!) 23 (!) 22 (!) 22   Temp:   100.3 F (37.9 C)   TempSrc:   Axillary   SpO2: 92% 91% 91% 96%  Weight:      Height:       Temp (24hrs), Avg:99.6 F (37.6 C), Min:98.9 F (37.2 C), Max:100.8 F (38.2 C)  CBC Latest Ref Rng & Units 07/04/2019 07/03/2019 07/03/2019  WBC 4.0 - 10.5 K/uL 11.1(H) 11.5(H) 11.0(H)  Hemoglobin 13.0 - 17.0 g/dL 13.7 13.9 13.9  Hematocrit 39.0 - 52.0 % 42.6 42.5 42.2  Platelets 150 - 400 K/uL 68(L) 61(L) 61(L)   BMP Latest Ref Rng & Units 07/04/2019 07/04/2019 07/04/2019  Glucose 70 - 99 mg/dL - 132(H) -  BUN 6 - 20 mg/dL - 19 -  Creatinine 0.61 - 1.24 mg/dL - 1.01 -  Sodium 135 - 145 mmol/L 159(H) 158(H) 158(H)  Potassium 3.5 - 5.1 mmol/L - 3.3(L) -  Chloride 98 - 111 mmol/L - 125(H) -  CO2 22 - 32 mmol/L - 27 -  Calcium 8.9 - 10.3 mg/dL - 9.1 -    Intake/Output Summary (Last 24 hours) at 07/04/2019 0956 Last data filed at 07/04/2019 0700 Gross per 24 hour  Intake 2110.19 ml  Output -  Net 2110.19 ml    Current Facility-Administered Medications:  .   stroke: mapping our early stages of recovery book, , Does not apply, Once, Ashok Pall, MD .  0.9 %  sodium chloride infusion, , Intravenous, Continuous, Rosalin Hawking, MD, Last Rate: 40 mL/hr at 07/04/19 0700, Rate Verify at 07/04/19 0700 .  acetaminophen (TYLENOL) tablet 650 mg, 650 mg, Oral, Q4H PRN **OR** acetaminophen (TYLENOL) 160 MG/5ML solution 650 mg, 650 mg, Per Tube, Q4H PRN, 650 mg at 07/04/19 0842 **OR** acetaminophen (TYLENOL) suppository 650 mg, 650 mg, Rectal, Q4H PRN, Deveshwar, Sanjeev, MD .  budesonide (PULMICORT) nebulizer solution 0.25 mg, 0.25 mg, Nebulization, BID, Rosana Hoes, Whitney F, NP .  ceFEPIme (MAXIPIME) 2 g in sodium chloride 0.9 % 100 mL IVPB, 2 g, Intravenous, Q8H,  Kipp Brood, MD, Stopped at 07/04/19 0557 .  chlorhexidine gluconate (MEDLINE KIT) (PERIDEX) 0.12 % solution 15 mL, 15 mL, Mouth Rinse, BID, Ashok Pall, MD, 15 mL at 07/04/19 0818 .  Chlorhexidine Gluconate Cloth 2 % PADS 6 each, 6 each, Topical, Daily, Aroor, Lanice Schwab, MD, 6 each at 07/03/19 2300 .  dexmedetomidine (PRECEDEX) 400 MCG/100ML (4 mcg/mL) infusion, 0.4-1.2 mcg/kg/hr, Intravenous, Titrated, Agarwala, Ravi, MD, Last Rate: 17.22 mL/hr at 07/04/19 0815, 0.6 mcg/kg/hr at 07/04/19 0815 .  feeding supplement (PRO-STAT SUGAR FREE 64) liquid 60 mL, 60 mL, Per Tube, QID, Agarwala, Ravi, MD, 60 mL at 07/03/19 2300 .  feeding supplement (VITAL HIGH PROTEIN) liquid 1,000 mL, 1,000 mL, Per Tube, Continuous, Agarwala, Ravi, MD, Last Rate: 25 mL/hr at 07/03/19 1900, Rate Verify at 07/03/19 1900 .  fentaNYL (SUBLIMAZE) injection 25-100 mcg, 25-100 mcg, Intravenous, Q1H PRN, Agarwala, Ravi, MD, 100 mcg at 07/04/19 0530 .  heparin injection 5,000 Units, 5,000 Units, Subcutaneous, Q8H, Agarwala, Ravi, MD, 5,000 Units at 07/04/19 0517 .  ipratropium-albuterol (DUONEB) 0.5-2.5 (3) MG/3ML nebulizer solution 3 mL, 3 mL, Nebulization, Q6H PRN, Merlene Laughter F, NP .  labetalol (NORMODYNE) injection 10-40 mg, 10-40 mg, Intravenous, Q10 min PRN, Ashok Pall, MD,  20 mg at 07/02/19 1302 .  levETIRAcetam (KEPPRA) 100 MG/ML solution 500 mg, 500 mg, Per Tube, BID, Rosalin Hawking, MD .  MEDLINE mouth rinse, 15 mL, Mouth Rinse, 10 times per day, Ashok Pall, MD, 15 mL at 07/04/19 0517 .  morphine 2 MG/ML injection 1-2 mg, 1-2 mg, Intravenous, Q2H PRN, Ashok Pall, MD, 2 mg at 07/02/19 6440 .  multivitamin with minerals tablet 1 tablet, 1 tablet, Per Tube, Daily, Agarwala, Einar Grad, MD, 1 tablet at 07/03/19 0932 .  naloxone [facility]) injection 0.08 mg, 0.08 mg, Intravenous, PRN, Ashok Pall, MD .  norepinephrine (LEVOPHED) 4 mg in dextrose 5 % 250 mL (0.016 mg/mL) infusion, 0-40 mcg/min, Intravenous, Titrated,  Sethi, Pramod S, MD .  ondansetron (ZOFRAN) tablet 4 mg, 4 mg, Oral, Q4H PRN **OR** ondansetron (ZOFRAN) injection 4 mg, 4 mg, Intravenous, Q4H PRN, Ashok Pall, MD .  pantoprazole sodium (PROTONIX) 40 mg/20 mL oral suspension 40 mg, 40 mg, Per Tube, Daily, Agarwala, Ravi, MD, 40 mg at 07/03/19 0932 .  potassium chloride 20 MEQ/15ML (10%) solution 40 mEq, 40 mEq, Per Tube, TID, Rinehuls, David L, PA-C .  promethazine (PHENERGAN) tablet 12.5-25 mg, 12.5-25 mg, Per Tube, Q4H PRN, Aroor, Karena Addison R, MD .  senna-docusate (Senokot-S) tablet 1 tablet, 1 tablet, Per Tube, QHS PRN, Aroor, Lanice Schwab, MD, 1 tablet at 07/04/19 0842 .  sodium chloride flush (NS) 0.9 % injection 10-40 mL, 10-40 mL, Intracatheter, Q12H, Greta Doom, MD, 20 mL at 07/03/19 2300 .  sodium chloride flush (NS) 0.9 % injection 10-40 mL, 10-40 mL, Intracatheter, PRN, Greta Doom, MD   Physical Exam: Getting echocardiogram on rounds this morning, limiting exam, intubated, sedated, PERRL with neutral gaze, +c/c/g  Assessment & Plan: 49 y.o. man w/ L ACA/MCA infarct s/p L hemicraniectomy.  -rpt CTH reviewed, shows expected evolution of L MCA/ACA infarcts, no significant midline shift, good decompression, no change in NSGY plan of care  Judith Part  07/04/19 9:56 AM

## 2019-07-04 NOTE — Anesthesia Postprocedure Evaluation (Signed)
Anesthesia Post Note  Patient: Lee Hooper  Procedure(s) Performed: IR WITH ANESTHESIA (N/A )     Patient location during evaluation: PACU Anesthesia Type: General Level of consciousness: sedated Pain management: pain level controlled Vital Signs Assessment: post-procedure vital signs reviewed and stable Respiratory status: patient remains intubated per anesthesia plan Cardiovascular status: stable Postop Assessment: no apparent nausea or vomiting Anesthetic complications: no    Last Vitals:  Vitals:   07/04/19 0852 07/04/19 1109  BP:    Pulse:    Resp:    Temp:    SpO2: 96% 96%    Last Pain:  Vitals:   07/04/19 0800  TempSrc: Axillary  PainSc:                  Arlicia Paquette

## 2019-07-04 NOTE — Progress Notes (Signed)
NAME:  Lee Hooper, MRN:  [id number], DOB:  11-26-1970, LOS: 3 ADMISSION DATE:  07/01/2019, CONSULTATION DATE:  07/01/2019 REFERRING MD:  Georgiana Spinner Aroor, CHIEF COMPLAINT:  CVA   Brief History   [name] is a 49 yo M who presented as a code stroke after being found aphasic and nonverbal by his wife. CT/CTA studies showed left ICA/MCA thrombosis. Patient was admitted by Neurology and went for IR atherectomy, which achieved revascularization. He was intubated for airway protection. PCCM consulted for vent management.  History of present illness   49 yo male with COPD and psoriasis who presented to [facility] on 07/01/19 for right hemiplegia and aphasia. LKN at 11pm on evening prior to admission.  Daughter is primary historian. Remainder of HPI obtained from chart review. She notes that she resides with the pt and that she did hear him get up this morning around 0650 however found him in the restroom with right hemiplegia and expressive aphasia.  On arrival to the ED, head imaging revealed large ICA/MCA thrombosis and pt was taken for IR thrombectomy for left ICA and left M1 occlusions. Repeat head CT was obtained following the procedure out of concern for possible hemorrhage and showed moderate basal ganglia hemorrhage vs stroke. Repeat MRI revealed that the ICA and MCA re-occluded with change in FLAIR sequence excluding him from repeat thrombectomy.  Neurosurgery was consulted for decompressive hemicraniectomy.  Past Medical History  Psoriasis COPD Tobacco Use  Significant Hospital Events   4/28: Admit; L ICA/MCA Thrombectomy with re-occlusion>left hemicraniectomy 4/29: bradycardic overnight  Consults:  IR PCCM Neuro (Primary) Neurosurgery  Procedures:  4/28 IR - L ICA/MCA Thrombectomy 4/28 L hemicraniectomy  Significant Diagnostic Tests:  4/28 Admit CT Head Code Stroke:  Hyperdense left ICA and MCA compatible with acute thrombus. No acute infarct or hemorrhage.  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 The patient has minimal atherosclerotic disease. The left internal carotid artery is occluded through the terminus and extending in the left M1 M2 and M3 branches. There is poor collateral circulation on the left. There is a large territory infarct involving the left hemisphere. Infarct volume 266 mL.  4/28 Admit MR Brain:  Restricted diffusion throughout much of the left MCA vascular territory consistent with acute ischemia. Restricted diffusion consistent with acute ischemia is also present within the paramedian left frontoparietal lobes, although this is less well assessed due to the degree of motion degradation at the level of the vertex. There is little if any corresponding T2/FLAIR hyperintensity at these sites. No significant mass effect.  No midline shift.  4/28 Post Thrombectomy CT Head: : 1. 3.0 x 2.6 x 3.9 cm region of hyperdensity centered within the left basal ganglia, left subinsular region and inferomedial left temporal lobe likely reflecting a combination of parenchymal hematoma and contrast staining.  2. Edema with loss of gray-white differentiation within the left basal ganglia, left insula and anterior left temporal lobe, likely acute infarction. Subtle changes of acute infarction are also suspected within the paramedian left frontal lobe ACA vascular territory. 3. Scattered small-volume subarachnoid hemorrhage along the left cerebral hemisphere. 4. Regional mass effect with effacement of the left lateral ventricle temporal horn. No midline shift.  4/28 Post Thrombectomy MR Brain:  1. Restricted diffusion throughout the majority of the left MCA and ACA vascular territories consistent with acute ischemia. 2. No significant mass effect at this time. No midline  shift. 3. The acute parenchymal hemorrhage and/or contrast staining centered within left basal  ganglia and inferomedial left temporal lobe on prior head CT does not appear significantly changed. Based on the MR appearance, it is suspected that a significant component of this previously demonstrated hyperdensity reflects contrast staining. 4. Redemonstrated small volume subarachnoid hemorrhage overlying the left cerebral hemisphere. Small volume subarachnoid hemorrhage is also questioned along the right cerebral hemisphere posteriorly.  4/29 Post Hemicraniectomy CT head:  Complete left ACA/MCA territory infarction with swollen brain bulging through craniectomy defect. No midline shift or entrapment. Petechial hemorrhage at left basal ganglia. Extraaxial hemorrhage along surface of the infarct.   4/30 CXR: increasing opacification of R lower lung field. (personal interpretation) Micro Data:  4/28 Negative Coronavirus PCR  Antimicrobials:  None   Interim history/subjective:  Lying in bed unable to follow any commands, appears calm on vent  8L positive, T-max 100.8 overnight   Objective   Blood pressure 113/75, pulse (!) 55, temperature 98.9 F (37.2 C), temperature source Axillary, resp. rate (!) 22, height 6' (1.829 m), weight 114.3 kg, SpO2 91 %.    Vent Mode: PRVC FiO2 (%):  [50 %-70 %] 60 % Set Rate:  [16 bmp] 16 bmp Vt Set:  [620 mL] 620 mL PEEP:  [5 cmH20-8 cmH20] 8 cmH20 Plateau Pressure:  [15 cmH20-24 cmH20] 24 cmH20   Intake/Output Summary (Last 24 hours) at 07/04/2019 0743 Last data filed at 07/04/2019 0700 Gross per 24 hour  Intake 2287.94 ml  Output 300 ml  Net 1987.94 ml   Examination: General: Middle aged male on mechanical ventilation, in NAD HEENT: ETT, MM pink/moist, PERRL, dressing in pace over head  Neuro: Unresponsive on precedex drip CV: s1s2 regular rate and rhythm, no murmur, rubs, or gallops,  PULM:  Expiratory wheeze, left greater than right, tolerating vent well, no increased work of breathing  GI: soft, bowel sounds active in all 4 quadrants,  non-tender, non-distended, tolerating TF Extremities: warm/dry, generalized edema  Skin: no rashes or lesions  Resolved Hospital Problem list   n/a  Assessment & Plan:   Critically ill due to acute hypoxic respiratory failure requiring mechanical ventilation Increasing oxygen requirements High suspicion for HCAP -Given that patient in purposeful on left and has strong cough, may not require tracheostomy. P: Continue ventilator support with lung protective strategies  Wean PEEP and FiO2 for sats greater than 90%. Head of bed elevated 30 degrees. Plateau pressures less than 30 cm H20.  Follow intermittent chest x-ray and ABG.   SAT/SBT as tolerated, mentation preclude extubation  Ensure adequate pulmonary hygiene  VAP bundle in place  PAD protocol Continue empiric cefepime  Follow cultures  Start BD due to wheezing on exam   Left ICA/MCA CVA Post op day #2 of left decompressive hemicraniectomy due to significant cerebral edema  P: Management per neurology  Maintain neuro protective measures; goal for eurothermia, euglycemia, eunatermia, normoxia Nutrition and bowel regiment  Seizure precautions  AEDs per neurology  3% saline stopped 4/30, Na slowly continues to rise   Electrolyte abnormalities  -hypernatremia, hypokalemia  P: Continue to monitor Na off 3% Start free water  Supplement K  Best practice:  Diet: TF Pain/Anxiety/Delirium protocol (if indicated): Precedex  VAP protocol (if indicated): In place  DVT prophylaxis: SCD GI prophylaxis: PPI Glucose control: SSI Mobility: Bedrest  Code Status: Full Family Communication:  Per primary  Disposition: ICU  Labs   CBC: Recent Labs  Lab 07/01/19 0759 07/01/19 0804 07/01/19 1930 07/01/19  2032 07/02/19 0759 07/02/19 0759 07/02/19 0806 07/02/19 2308 07/03/19 0500 07/03/19 0900 07/04/19 0500  WBC 6.6   < > 11.5*  --  11.0*  --   --   --  11.0* 11.5* 11.1*  NEUTROABS 4.3  --   --   --  9.5*  --   --   --    --   --   --   HGB 15.9   < > 14.9   < > 14.9   < > 14.3 12.6* 13.9 13.9 13.7  HCT 48.3   < > 44.2   < > 44.0   < > 42.0 37.0* 42.2 42.5 42.6  MCV 92.2   < > 86.7  --  84.1  --   --   --  87.4 88.9 88.2  PLT 166   < > 41*  --  51*  --   --   --  61* 61* 68*   < > = values in this interval not displayed.    Basic Metabolic Panel: Recent Labs  Lab 07/01/19 0759 07/01/19 0759 07/01/19 0804 07/01/19 1629 07/01/19 2009 07/01/19 2032 07/02/19 0759 07/02/19 0759 07/02/19 0806 07/02/19 1430 07/02/19 1832 07/02/19 1951 07/02/19 2308 07/03/19 0151 07/03/19 0500 07/03/19 0500 07/03/19 0900 07/03/19 1351 07/03/19 1708 07/03/19 1951 07/04/19 0151 07/04/19 0500  NA 139   < > 139   < > 142   < > 149*   < > 149*   < >  --    < > 157*   < > 158*   < > 158* 158*  --  159* 158* 158*  K 4.1   < > 3.9   < > 3.7   < > 4.2  --  4.1  --   --   --  3.6  --  3.7  --   --   --   --   --   --  3.3*  CL 108   < > 105  --  110  --  118*  --   --   --   --   --   --   --  127*  --   --   --   --   --   --  125*  CO2 25  --   --   --  22  --  23  --   --   --   --   --   --   --  24  --   --   --   --   --   --  27  GLUCOSE 160*   < > 151*  --  178*  --  160*  --   --   --   --   --   --   --  158*  --   --   --   --   --   --  132*  BUN 7   < > 7  --  6  --  8  --   --   --   --   --   --   --  15  --   --   --   --   --   --  19  CREATININE 1.07   < > 1.00  --  0.97  --  0.97  --   --   --   --   --   --   --  0.89  --  1.00  --   --   --   --  1.01  CALCIUM 8.6*  --   --   --  8.7*  --  8.4*  --   --   --   --   --   --   --  8.6*  --   --   --   --   --   --  9.1  MG  --   --   --   --   --   --  1.8  --   --   --  1.7  --   --   --  2.1  --   --   --  2.2  --   --   --   PHOS  --   --   --   --   --   --  2.3*  --   --   --  1.6*  --   --   --  1.5*  --   --   --  2.5  --   --   --    < > = values in this interval not displayed.   GFR: Estimated Creatinine Clearance: 114.2 mL/min (by C-G formula  based on SCr of 1.01 mg/dL). Recent Labs  Lab 07/02/19 0759 07/03/19 0500 07/03/19 0900 07/04/19 0500  WBC 11.0* 11.0* 11.5* 11.1*    Liver Function Tests: Recent Labs  Lab 07/01/19 0759 07/01/19 2009 07/02/19 0759 07/03/19 0500 07/04/19 0500  AST 22 39 28 30 27   ALT 27 40 34 35 29  ALKPHOS 50 40 34* 34* 36*  BILITOT 0.6 2.7* 0.8 0.6 0.8  PROT 5.8* 5.2* 5.1* 4.9* 5.4*  ALBUMIN 3.4* 3.2* 3.0* 2.7* 2.6*   No results for input(s): LIPASE, AMYLASE in the last 168 hours. No results for input(s): AMMONIA in the last 168 hours.  ABG    Component Value Date/Time   PHART 7.377 07/02/2019 2308   PCO2ART 43.4 07/02/2019 2308   PO2ART 91 07/02/2019 2308   HCO3 25.4 07/02/2019 2308   TCO2 27 07/02/2019 2308   ACIDBASEDEF 3.0 (H) 07/02/2019 0806   O2SAT 97.0 07/02/2019 2308     Coagulation Profile: Recent Labs  Lab 07/01/19 0759 07/01/19 1821  INR 1.0 1.4*    Cardiac Enzymes: No results for input(s): CKTOTAL, CKMB, CKMBINDEX, TROPONINI in the last 168 hours.  HbA1C: Hgb A1c MFr Bld  Date/Time Value Ref Range Status  07/02/2019 04:31 AM 5.5 4.8 - 5.6 % Final    Comment:    (NOTE) Pre diabetes:          5.7%-6.4% Diabetes:              >6.4% Glycemic control for   <7.0% adults with diabetes     CBG: Recent Labs  Lab 07/03/19 1120 07/03/19 1551 07/03/19 1926 07/03/19 2324 07/04/19 0323  GLUCAP 110* 120* 115* 124* 129*    Critical care time:    Performed by: 09/03/19  Total critical care time: 40 minutes  Critical care time was exclusive of separately billable procedures and treating other patients.  Critical care was necessary to treat or prevent imminent or life-threatening deterioration.  Critical care was time spent personally by me on the following activities: development of treatment plan with patient and/or surrogate as well as nursing, discussions with consultants, evaluation of patient's response to treatment, examination of patient,  obtaining history from patient or surrogate, ordering  and performing treatments and interventions, ordering and review of laboratory studies, ordering and review of radiographic studies, pulse oximetry and re-evaluation of patient's condition.  Johnsie Cancel, NP-C [facility] Contact / Pager information can be found on Amion  07/04/2019, 7:46 AM

## 2019-07-04 NOTE — Progress Notes (Signed)
OT Cancellation Note  Patient Details Name: [name] MRN: [id number] DOB: [birth year]   Cancelled Treatment:    Reason Eval/Treat Not Completed: Patient not medically ready.  Will check back on Monday.  Eber Jones., OTR/L Acute Rehabilitation Services Pager [phone] Office [phone]   Jeani Hawking M 07/04/2019, 8:05 AM

## 2019-07-04 NOTE — Progress Notes (Signed)
PT Cancellation Note  Patient Details Name: Lee Hooper MRN: [id number] DOB: 1970-04-18   Cancelled Treatment:    Reason Eval/Treat Not Completed: Medical issues which prohibited therapy(spoke with nsg, not medically ready at this time) Will hold and follow up when appropriate.   Fabio Asa 07/04/2019, 7:21 AM Charlotte Crumb, PT DPT  Board Certified Neurologic Specialist Acute Rehabilitation Services Office [phone]

## 2019-07-04 NOTE — Progress Notes (Signed)
  Echocardiogram 2D Echocardiogram has been performed with [facility].  Gerda Diss 07/04/2019, 10:36 AM

## 2019-07-05 ENCOUNTER — Inpatient Hospital Stay (HOSPITAL_COMMUNITY): Payer: Medicaid Other

## 2019-07-05 DIAGNOSIS — I6602 Occlusion and stenosis of left middle cerebral artery: Secondary | ICD-10-CM

## 2019-07-05 DIAGNOSIS — D72829 Elevated white blood cell count, unspecified: Secondary | ICD-10-CM

## 2019-07-05 LAB — COMPREHENSIVE METABOLIC PANEL
ALT: 30 U/L (ref 0–44)
AST: 28 U/L (ref 15–41)
Albumin: 2.5 g/dL — ABNORMAL LOW (ref 3.5–5.0)
Alkaline Phosphatase: 40 U/L (ref 38–126)
Anion gap: 7 (ref 5–15)
BUN: 22 mg/dL — ABNORMAL HIGH (ref 6–20)
CO2: 26 mmol/L (ref 22–32)
Calcium: 9.2 mg/dL (ref 8.9–10.3)
Chloride: 125 mmol/L — ABNORMAL HIGH (ref 98–111)
Creatinine, Ser: 1.03 mg/dL (ref 0.61–1.24)
GFR calc Af Amer: >60 mL/min (ref >60)
GFR calc non Af Amer: >60 mL/min (ref >60)
Glucose, Bld: 127 mg/dL — ABNORMAL HIGH (ref 70–99)
Potassium: 3.5 mmol/L (ref 3.5–5.1)
Sodium: 158 mmol/L — ABNORMAL HIGH (ref 135–145)
Total Bilirubin: 0.7 mg/dL (ref 0.3–1.2)
Total Protein: 5.6 g/dL — ABNORMAL LOW (ref 6.5–8.1)

## 2019-07-05 LAB — URINALYSIS, COMPLETE (UACMP) WITH MICROSCOPIC
Bacteria, UA: NONE SEEN
Bilirubin Urine: NEGATIVE
Glucose, UA: NEGATIVE mg/dL
Hgb urine dipstick: NEGATIVE
Ketones, ur: NEGATIVE mg/dL
Leukocytes,Ua: NEGATIVE
Nitrite: NEGATIVE
Protein, ur: 30 mg/dL — AB
Specific Gravity, Urine: 1.025 (ref 1.005–1.030)
pH: 6 (ref 5.0–8.0)

## 2019-07-05 LAB — CBC
HCT: 43.1 % (ref 39.0–52.0)
Hemoglobin: 13.8 g/dL (ref 13.0–17.0)
MCH: 28.8 pg (ref 26.0–34.0)
MCHC: 32 g/dL (ref 30.0–36.0)
MCV: 89.8 fL (ref 80.0–100.0)
Platelets: 88 10*3/uL — ABNORMAL LOW (ref 150–400)
RBC: 4.8 MIL/uL (ref 4.22–5.81)
RDW: 14.8 % (ref 11.5–15.5)
WBC: 10 10*3/uL (ref 4.0–10.5)
nRBC: 0.2 % (ref 0.0–0.2)

## 2019-07-05 LAB — CULTURE, RESPIRATORY W GRAM STAIN

## 2019-07-05 LAB — GLUCOSE, CAPILLARY
Glucose-Capillary: 103 mg/dL — ABNORMAL HIGH (ref 70–99)
Glucose-Capillary: 113 mg/dL — ABNORMAL HIGH (ref 70–99)
Glucose-Capillary: 116 mg/dL — ABNORMAL HIGH (ref 70–99)
Glucose-Capillary: 158 mg/dL — ABNORMAL HIGH (ref 70–99)
Glucose-Capillary: 110 mg/dL — ABNORMAL HIGH (ref 70–99)
Glucose-Capillary: 118 mg/dL — ABNORMAL HIGH (ref 70–99)

## 2019-07-05 LAB — SODIUM
Sodium: 160 mmol/L — ABNORMAL HIGH (ref 135–145)
Sodium: 157 mmol/L — ABNORMAL HIGH (ref 135–145)
Sodium: 157 mmol/L — ABNORMAL HIGH (ref 135–145)
Sodium: 156 mmol/L — ABNORMAL HIGH (ref 135–145)

## 2019-07-05 MED ORDER — AMOXICILLIN-POT CLAVULANATE 875-125 MG PO TABS
1.0000 | ORAL_TABLET | Freq: Two times a day (BID) | ORAL | Status: DC
  Administered 2019-07-05 – 2019-07-07 (×5): 1
  Filled 2019-07-05 (×6): qty 1

## 2019-07-05 MED ORDER — PREDNISONE 20 MG PO TABS: 50.0000 mg | ORAL_TABLET | Freq: Every day | ORAL | Status: DC

## 2019-07-05 MED ORDER — SENNOSIDES 8.8 MG/5ML PO SYRP
5.0000 mL | ORAL_SOLUTION | Freq: Every day | ORAL | Status: DC
  Administered 2019-07-05 – 2019-07-08 (×4): 5 mL
  Filled 2019-07-05 (×4): qty 5

## 2019-07-05 MED ORDER — PREDNISONE 20 MG PO TABS
50.0000 mg | ORAL_TABLET | Freq: Every day | ORAL | Status: AC
  Administered 2019-07-05 – 2019-07-08 (×4): 50 mg
  Filled 2019-07-05 (×4): qty 3

## 2019-07-05 MED ORDER — POLYETHYLENE GLYCOL 3350 17 G PO PACK: 17.0000 g | PACK | Freq: Every day | ORAL | Status: DC

## 2019-07-05 MED ORDER — VITAL HIGH PROTEIN PO LIQD: 1000.0000 mL | ORAL | Status: DC

## 2019-07-05 MED ORDER — POLYETHYLENE GLYCOL 3350 17 G PO PACK
17.0000 g | PACK | Freq: Every day | ORAL | Status: DC
  Administered 2019-07-05 – 2019-07-08 (×4): 17 g
  Filled 2019-07-05 (×4): qty 1

## 2019-07-05 MED ORDER — BISACODYL 5 MG PO TBEC: 10.0000 mg | DELAYED_RELEASE_TABLET | Freq: Every day | ORAL | Status: DC

## 2019-07-05 MED ORDER — BISACODYL 10 MG RE SUPP
10.0000 mg | Freq: Every day | RECTAL | Status: DC | PRN
  Administered 2019-07-08: 10 mg via RECTAL
  Filled 2019-07-05: qty 1

## 2019-07-05 NOTE — Progress Notes (Addendum)
NAME:  Lee Hooper, MRN:  [id number], DOB:  1971/02/15, LOS: 4 ADMISSION DATE:  07/01/2019, CONSULTATION DATE:  07/01/2019 REFERRING MD:  [name], CHIEF COMPLAINT:  CVA   Brief History   Lee Hooper is a 49 yo M who presented as a code stroke after being found aphasic and nonverbal by his wife. CT/CTA studies showed left ICA/MCA thrombosis. Patient was admitted by Neurology and went for IR atherectomy, which achieved revascularization. He was intubated for airway protection. PCCM consulted for vent management.  History of present illness   49 yo male with COPD and psoriasis who presented to [facility] on 07/01/19 for right hemiplegia and aphasia. LKN at 11pm on evening prior to admission.  Daughter is primary historian. Remainder of HPI obtained from chart review. She notes that she resides with the pt and that she did hear him get up this morning around 0650 however found him in the restroom with right hemiplegia and expressive aphasia.  On arrival to the ED, head imaging revealed large ICA/MCA thrombosis and pt was taken for IR thrombectomy for left ICA and left M1 occlusions. Repeat head CT was obtained following the procedure out of concern for possible hemorrhage and showed moderate basal ganglia hemorrhage vs stroke. Repeat MRI revealed that the ICA and MCA re-occluded with change in FLAIR sequence excluding him from repeat thrombectomy.  Neurosurgery was consulted for decompressive hemicraniectomy.  Past Medical History  Psoriasis COPD Tobacco Use  Significant Hospital Events   4/28: Admit; L ICA/MCA Thrombectomy with re-occlusion>left hemicraniectomy 4/29: bradycardic overnight  Consults:  IR PCCM Neuro (Primary) Neurosurgery  Procedures:  4/28 IR - L ICA/MCA Thrombectomy 4/28 L hemicraniectomy  Significant Diagnostic Tests:  4/28 Admit CT Head Code Stroke:  Hyperdense left ICA and MCA compatible with acute thrombus. No acute infarct or hemorrhage.  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 The patient has minimal atherosclerotic disease. The left internal carotid artery is occluded through the terminus and extending in the left M1 M2 and M3 branches. There is poor collateral circulation on the left. There is a large territory infarct involving the left hemisphere. Infarct volume 266 mL.  4/28 Admit MR Brain:  Restricted diffusion throughout much of the left MCA vascular territory consistent with acute ischemia. Restricted diffusion consistent with acute ischemia is also present within the paramedian left frontoparietal lobes, although this is less well assessed due to the degree of motion degradation at the level of the vertex. There is little if any corresponding T2/FLAIR hyperintensity at these sites. No significant mass effect.  No midline shift.  4/28 Post Thrombectomy CT Head: : 1. 3.0 x 2.6 x 3.9 cm region of hyperdensity centered within the left basal ganglia, left subinsular region and inferomedial left temporal lobe likely reflecting a combination of parenchymal hematoma and contrast staining.  2. Edema with loss of gray-white differentiation within the left basal ganglia, left insula and anterior left temporal lobe, likely acute infarction. Subtle changes of acute infarction are also suspected within the paramedian left frontal lobe ACA vascular territory. 3. Scattered small-volume subarachnoid hemorrhage along the left cerebral hemisphere. 4. Regional mass effect with effacement of the left lateral ventricle temporal horn. No midline shift.  4/28 Post Thrombectomy MR Brain:  1. Restricted diffusion throughout the majority of the left MCA and ACA vascular territories consistent with acute ischemia. 2. No significant mass effect at this time. No midline  shift. 3. The acute parenchymal hemorrhage and/or contrast staining centered within left basal  ganglia and inferomedial left temporal lobe on prior head CT does not appear significantly changed. Based on the MR appearance, it is suspected that a significant component of this previously demonstrated hyperdensity reflects contrast staining. 4. Redemonstrated small volume subarachnoid hemorrhage overlying the left cerebral hemisphere. Small volume subarachnoid hemorrhage is also questioned along the right cerebral hemisphere posteriorly.  4/29 Post Hemicraniectomy CT head:  Complete left ACA/MCA territory infarction with swollen brain bulging through craniectomy defect. No midline shift or entrapment. Petechial hemorrhage at left basal ganglia. Extraaxial hemorrhage along surface of the infarct.   4/30 CXR: increasing opacification of R lower lung field. (personal interpretation)  5/1 Head CT > Slightly increased rightward midline shift with a small amount of herniation beneath the anterior falx. Otherwise unchanged Examination.  Micro Data:  4/28 Negative Coronavirus PCR  Antimicrobials:  None   Interim history/subjective:  RN reports no acute events overnight, remains unresponisce on vent   Objective   Blood pressure 138/81, pulse 93, temperature (!) 100.5 F (38.1 C), temperature source Axillary, resp. rate (!) 27, height 6' (1.829 m), weight 113 kg, SpO2 90 %.    Vent Mode: PRVC FiO2 (%):  [40 %-60 %] 40 % Set Rate:  [16 bmp] 16 bmp Vt Set:  [620 mL] 620 mL PEEP:  [5 cmH20-8 cmH20] 5 cmH20 Plateau Pressure:  [21 cmH20-26 cmH20] 24 cmH20   Intake/Output Summary (Last 24 hours) at 07/05/2019 0730 Last data filed at 07/05/2019 0700 Gross per 24 hour  Intake 1849.63 ml  Output 1850 ml  Net -0.37 ml   Examination: General: Chronically ill appearing middle aged male on mechanical ventilation, in NAD HEENT: ETT, MM pink/moist, PERRL, pupils 5mm bilaterally Neuro: Unresponsive, non purposeful movement seen to toes on left foot  CV: s1s2 regular rate and rhythm, no murmur,  rubs, or gallops,  PULM:  Clear to ascultation bilaterally, no wheezing auscultated today, no increased work of breathing GI: soft, bowel sounds hypoactive in all 4 quadrants, non-tender, mildly distended, tolerating TF Extremities: warm/dry, generalized edema  Skin: no rashes or lesions  Resolved Hospital Problem list   n/a  Assessment & Plan:   Critically ill due to acute hypoxic respiratory failure requiring mechanical ventilation Increasing oxygen requirements High suspicion for HCAP -MRI brain 5/1 revealed Slightly increased rightward midline shift with a small amount of herniation beneath the anterior falx. P: Continue ventilator support with lung protective strategies  Wean PEEP and FiO2 for sats greater than 90%. Head of bed elevated 30 degrees. Plateau pressures less than 30 cm H20.  Follow intermittent chest x-ray and ABG.   SAT/SBT as tolerated, mentation preclude extubation  Ensure adequate pulmonary hygiene  Follow cultures  VAP bundle in place  PAD protocol Continue empiric cefepime   Wheezing with HX of active smoking  -Wheezing improved from day prior but persist today, unknown history of COPD.  P: Continue BDs Will add short course of steroids  Continue vent support as above   Large Left ICA/MCA CVA Post op day #2 of left decompressive hemicraniectomy due to significant cerebral edema  -Given extent of cerebral edema and size of stroke prognosis remains poor  P: Management per neurology  Maintain neuro protective measures; goal for eurothermia, euglycemia, eunatermia, normoxia Nutrition and bowel regiment  Seizure precautions  AEDs per neurology  Allow for hypernatremia, Na goal 155-160  Electrolyte abnormalities  -hypernatremia, hypokalemia  P: Follow Na q6hrs  Supplement  K as needed   Constipation  -No BM since arrival, abdomen is distended with hypoactive bowel sounds  P: Increase bowel regiment  Add PRN suppository   Best practice:  Diet:  TF Pain/Anxiety/Delirium protocol (if indicated): Precedex  VAP protocol (if indicated): In place  DVT prophylaxis: SCD GI prophylaxis: PPI Glucose control: SSI Mobility: Bedrest  Code Status: Full Family Communication:  Per primary  Disposition: ICU  Labs   CBC: Recent Labs  Lab 07/01/19 0759 07/01/19 0804 07/02/19 0759 07/02/19 0806 07/02/19 2308 07/03/19 0500 07/03/19 0900 07/04/19 0500 07/05/19 0413  WBC 6.6   < > 11.0*  --   --  11.0* 11.5* 11.1* 10.0  NEUTROABS 4.3  --  9.5*  --   --   --   --   --   --   HGB 15.9   < > 14.9   < > 12.6* 13.9 13.9 13.7 13.8  HCT 48.3   < > 44.0   < > 37.0* 42.2 42.5 42.6 43.1  MCV 92.2   < > 84.1  --   --  87.4 88.9 88.2 89.8  PLT 166   < > 51*  --   --  61* 61* 68* 88*   < > = values in this interval not displayed.    Basic Metabolic Panel: Recent Labs  Lab 07/01/19 2009 07/01/19 2032 07/02/19 0759 07/02/19 0759 07/02/19 0806 07/02/19 1430 07/02/19 1832 07/02/19 1951 07/02/19 2308 07/03/19 0151 07/03/19 0500 07/03/19 0500 07/03/19 0900 07/03/19 1351 07/03/19 1708 07/03/19 1951 07/04/19 0500 07/04/19 0500 07/04/19 0835 07/04/19 1505 07/04/19 2000 07/05/19 0151 07/05/19 0413  NA 142   < > 149*   < > 149*   < >  --    < > 157*   < > 158*   < > 158*   < >  --    < > 158*   < > 159* 160* 159* 160* 158*  K 3.7   < > 4.2   < > 4.1  --   --   --  3.6  --  3.7  --   --   --   --   --  3.3*  --   --   --   --   --  3.5  CL 110  --  118*  --   --   --   --   --   --   --  127*  --   --   --   --   --  125*  --   --   --   --   --  125*  CO2 22  --  23  --   --   --   --   --   --   --  24  --   --   --   --   --  27  --   --   --   --   --  26  GLUCOSE 178*  --  160*  --   --   --   --   --   --   --  158*  --   --   --   --   --  132*  --   --   --   --   --  127*  BUN 6  --  8  --   --   --   --   --   --   --  15  --   --   --   --   --  19  --   --   --   --   --  22*  CREATININE 0.97   < > 0.97  --   --   --   --   --    --   --  0.89  --  1.00  --   --   --  1.01  --   --   --   --   --  1.03  CALCIUM 8.7*  --  8.4*  --   --   --   --   --   --   --  8.6*  --   --   --   --   --  9.1  --   --   --   --   --  9.2  MG  --   --  1.8  --   --   --  1.7  --   --   --  2.1  --   --   --  2.2  --   --   --   --   --   --   --   --   PHOS  --   --  2.3*  --   --   --  1.6*  --   --   --  1.5*  --   --   --  2.5  --   --   --   --   --   --   --   --    < > = values in this interval not displayed.   GFR: Estimated Creatinine Clearance: 111.4 mL/min (by C-G formula based on SCr of 1.03 mg/dL). Recent Labs  Lab 07/03/19 0500 07/03/19 0900 07/04/19 0500 07/05/19 0413  WBC 11.0* 11.5* 11.1* 10.0    Liver Function Tests: Recent Labs  Lab 07/01/19 2009 07/02/19 0759 07/03/19 0500 07/04/19 0500 07/05/19 0413  AST 39 28 30 27 28   ALT 40 34 35 29 30  ALKPHOS 40 34* 34* 36* 40  BILITOT 2.7* 0.8 0.6 0.8 0.7  PROT 5.2* 5.1* 4.9* 5.4* 5.6*  ALBUMIN 3.2* 3.0* 2.7* 2.6* 2.5*   No results for input(s): LIPASE, AMYLASE in the last 168 hours. No results for input(s): AMMONIA in the last 168 hours.  ABG    Component Value Date/Time   PHART 7.377 07/02/2019 2308   PCO2ART 43.4 07/02/2019 2308   PO2ART 91 07/02/2019 2308   HCO3 25.4 07/02/2019 2308   TCO2 27 07/02/2019 2308   ACIDBASEDEF 3.0 (H) 07/02/2019 0806   O2SAT 97.0 07/02/2019 2308     Coagulation Profile: Recent Labs  Lab 07/01/19 0759 07/01/19 1821  INR 1.0 1.4*    Cardiac Enzymes: No results for input(s): CKTOTAL, CKMB, CKMBINDEX, TROPONINI in the last 168 hours.  HbA1C: Hgb A1c MFr Bld  Date/Time Value Ref Range Status  07/02/2019 04:31 AM 5.5 4.8 - 5.6 % Final    Comment:    (NOTE) Pre diabetes:          5.7%-6.4% Diabetes:              >6.4% Glycemic control for   <7.0% adults with diabetes     CBG: Recent Labs  Lab 07/04/19 1151 07/04/19 1558 07/04/19 1953 07/04/19 2339 07/05/19 0354  GLUCAP 139* 115* 111* 115* 103*      Critical care time:  Performed by: Delfin GantWhitney F Diondre Pulis  Total critical care time: 37 minutes  Critical care time was exclusive of separately billable procedures and treating other patients.  Critical care was necessary to treat or prevent imminent or life-threatening deterioration.  Critical care was time spent personally by me on the following activities: development of treatment plan with patient and/or surrogate as well as nursing, discussions with consultants, evaluation of patient's response to treatment, examination of patient, obtaining history from patient or surrogate, ordering and performing treatments and interventions, ordering and review of laboratory studies, ordering and review of radiographic studies, pulse oximetry and re-evaluation of patient's condition.  Delfin GantWhitney F Natasia Sanko, NP-C [facility] Contact / Pager information can be found on Amion  07/05/2019, 7:30 AM

## 2019-07-05 NOTE — Progress Notes (Signed)
STROKE TEAM PROGRESS NOTE   INTERVAL HISTORY No family at bedside. RN at bedside. Pt off precedex yesterday, open eyes now on voice, still has left gaze and right hemiplegia. However, has persistent fever, will do pan culture. CXR pending this am. On cefepime. Will increase tube feeding. On weaning this am.   Vitals:   07/05/19 0700 07/05/19 0710 07/05/19 0753 07/05/19 0801  BP: 138/81  138/81 (!) 149/85  Pulse: 93  91 93  Resp: (!) 27   (!) 28  Temp:      TempSrc:      SpO2: (!) 89% 90% 92% 92%  Weight:      Height:        CBC:  Recent Labs  Lab 07/01/19 0759 07/01/19 0804 07/02/19 0759 07/02/19 0806 07/04/19 0500 07/05/19 0413  WBC 6.6   < > 11.0*   < > 11.1* 10.0  NEUTROABS 4.3  --  9.5*  --   --   --   HGB 15.9   < > 14.9   < > 13.7 13.8  HCT 48.3   < > 44.0   < > 42.6 43.1  MCV 92.2   < > 84.1   < > 88.2 89.8  PLT 166   < > 51*   < > 68* 88*   < > = values in this interval not displayed.    Basic Metabolic Panel:  Recent Labs  Lab 07/03/19 0500 07/03/19 0900 07/03/19 1708 07/03/19 1951 07/04/19 0500 07/04/19 0835 07/05/19 0151 07/05/19 0413  NA 158*   < >  --    < > 158*   < > 160* 158*  K 3.7  --   --   --  3.3*  --   --  3.5  CL 127*  --   --   --  125*  --   --  125*  CO2 24  --   --   --  27  --   --  26  GLUCOSE 158*  --   --   --  132*  --   --  127*  BUN 15  --   --   --  19  --   --  22*  CREATININE 0.89   < >  --   --  1.01  --   --  1.03  CALCIUM 8.6*  --   --   --  9.1  --   --  9.2  MG 2.1  --  2.2  --   --   --   --   --   PHOS 1.5*  --  2.5  --   --   --   --   --    < > = values in this interval not displayed.   Lipid Panel:     Component Value Date/Time   CHOL 92 07/02/2019 0431   TRIG 137 07/03/2019 0500   HDL 30 (L) 07/02/2019 0431   CHOLHDL 3.1 07/02/2019 0431   VLDL 17 07/02/2019 0431   LDLCALC 45 07/02/2019 0431   HgbA1c:  Lab Results  Component Value Date   HGBA1C 5.5 07/02/2019   Urine Drug Screen:     Component  Value Date/Time   LABOPIA NONE DETECTED 07/01/2019 1930   COCAINSCRNUR NONE DETECTED 07/01/2019 1930   LABBENZ POSITIVE (A) 07/01/2019 1930   AMPHETMU NONE DETECTED 07/01/2019 1930   THCU NONE DETECTED 07/01/2019 1930   LABBARB NONE DETECTED 07/01/2019 1930  Alcohol Level     Component Value Date/Time   ETH <10 07/01/2019 0759    IMAGING past 24 hours  ECHOCARDIOGRAM COMPLETE 07/04/2019 IMPRESSIONS   1. Technically difficult; normal LV function.   2. Left ventricular ejection fraction, by estimation, is 55 to 60%. The left ventricle has normal function. The left ventricle has no regional wall motion abnormalities. Left ventricular diastolic parameters were normal.   3. Right ventricular systolic function is normal. The right ventricular size is normal.   4. The mitral valve is normal in structure. No evidence of mitral valve regurgitation. No evidence of mitral stenosis.   5. The aortic valve is normal in structure. Aortic valve regurgitation is not visualized. No aortic stenosis is present.   6. The inferior vena cava is dilated in size with >50% respiratory variability, suggesting right atrial pressure of 8 mmHg.    PHYSICAL EXAM  Temp:  [98.6 F (37 C)-101.4 F (38.6 C)] 100.5 F (38.1 C) (05/02 0400) Pulse Rate:  [57-113] 93 (05/02 0801) Resp:  [22-31] 28 (05/02 0801) BP: (111-149)/(72-89) 149/85 (05/02 0801) SpO2:  [89 %-100 %] 92 % (05/02 0801) FiO2 (%):  [40 %-60 %] 50 % (05/02 0801) Weight:  [660 kg] 113 kg (05/02 0359)  General - Well nourished, well developed, intubated off precedex. Dressing around head.   Ophthalmologic - fundi not visualized due to noncooperation.  Cardiovascular - Regular rate and rhythm, intermittent tachycardia.  Neuro - intubated off sedation, eyes closed but open on voice, not following commands. With eye opening, eyes in left gaze preference position, not able to cross midline, not blinking to visual threat on the right but able to  blink on the left, doll's eyes present, PERRL. Corneal reflex weak on the right and brisk on the left, gag and cough present. Breathing over the vent.  Facial symmetry not able to test due to ET tube.  Tongue protrusion not cooperative. On pain stimulation, left UE 3+/5 and LE 3-/5 with purposeful movement and localizing to pain. However, right UE and LE plegic. DTR 1+ and no babinski. Sensation, coordination and gait not tested.    ASSESSMENT/PLAN [name] with history of COPD, ongoing tobacco use, and psoriasis, presenting with dense LMCA syndrome. Attempt for Left ICA and Left M1 thrombectomy was done emergently with TICI 2b flow. Unfortunately, the pt had post procedure hemorrhage and reocclusion of the vessels. F/u imaging suggested early edema and pt underwent prophylactic decompressive hemicraniectomy.  Patient was enrolled in the CHARM trial for cytotoxic edema but was a screen failure due to increase core size greater than 300 mL on DWI  Stroke: Large LMCA and ACA stroke d/t terminal left ICA occlusions s/p EVT but with reocclusion and malignant cerebral edema s/p left [facility].  Stroke etiology unknown  Code Stroke CT head showed early hypodensity in nearly entire LMCA. ASPECTS 6-7 per notes.  CTA head & neck occlusion cervical ICA on the left. This is most likely due to dissection. The patient has minimal atherosclerotic disease.  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  There is poor collateral circulation on the left.   CTP CBF large core volume of 266 mL, perfusion volume 313 mL, mismatch volume 47 mL.  ASPECTS using CTP by Dr. Chestine Spore 6 or 7.   IR - TICI2c revascularization  MRI  Restricted diffusion throughout the majority of the left MCA and  ACA vascular territories consistent with acute ischemia.  MRA - reocclusion of the intracranial left internal carotid artery. No flow related  signal is seen within the left middle cerebral artery.  CT Head 07/04/19 - Slightly increased rightward midline shift with a small amount of herniation beneath the anterior falx. Otherwise unchanged examination.  2D Echo - EF 55 - 60%. No cardiac source of emboli identified.   LDL 45  HgbA1c 5.5  SCDs d/t bleed for VTE prophylaxis - [place] Heparin started 07/03/19  none prior to admission, now on none d/t hemorrhagic conversion  Therapy recommendations:  pending  Disposition:  pending  Respiratory failure  Intubated on vent  CCM on board  CXR 07/03/19- New consolidation in the right lower lobe and left infrahilar region concerning for pneumonia. Possible small layering right effusion.  On cefepime 4/30>> for pneumonia  Cerebral edema  MRI  Restricted diffusion throughout the majority of the left MCA and ACA vascular territories consistent with acute ischemia.  CT Head 07/04/19 - Slightly increased rightward midline shift with a small amount of herniation beneath the anterior falx. Otherwise unchanged examination.  S/p [facility] 4/28  On keppra  Na 158->159->160->158  Goal Na 155-160  On TF @ 50 and 1/2 NS @ 40  Na Q6h  Fever   Tmax 100.4->100.8->100.5  Leukocytosis - 6.6->11.0->11.5->11.1->10.0  CXR 07/03/19- New consolidation in the right lower lobe and left infrahilar region concerning for pneumonia. Possible small layering right effusion.  UA neg  On cefepime 4/30>>  Blood culture pending  Hypertension  Home meds:  None  Current meds: Normodyne prn ; Levophed prn  stable  SBP goal < 160 due to hemorrhagic conversion . Long-term BP goal normotensive  Dysphagia   NPO  On TF @ 25 -> 27  Dietitian on board  Tobacco abuse  Current smoker  Smoking cessation counseling will be provided if able  Other Stroke Risk Factors  Obesity, Body mass index is 33.79 kg/m., recommend weight loss, diet and exercise as appropriate   Other Active Problems  Code  Status - Full code  Hypokalemia - 3.3 -> supplement and rechecck->3.5  Bradycardia - mild  Thrombocytopenia - 166->51->61->68 ->88 - monitor - improving  Hospital day # 4  This patient is critically ill and at significant risk of neurological worsening, death and care requires constant monitoring of vital signs, hemodynamics,respiratory and cardiac monitoring, extensive review of multiple databases, frequent neurological assessment, discussion with family, other specialists and medical decision making of high complexity. I spent 35 minutes of neurocritical care time  in the care of  this patient.   Rosalin Hawking, MD PhD Stroke Neurology 07/05/2019 7:05 PM  To contact Stroke Continuity provider, please refer to [email or web address] After hours, contact General Neurology

## 2019-07-05 NOTE — Progress Notes (Signed)
Per abd xray "Air-filled distended colonic loop in the right lower quadrant measuring 12.5 cm likely the cecum and likely due to ileus." Discussed with Raymon Mutton, NP. Patient's abd very distended. Will hold tube feed today.

## 2019-07-05 NOTE — Progress Notes (Signed)
Neurosurgery Service Progress Note  Subjective: No acute events overnight   Objective: Vitals:   07/05/19 0710 07/05/19 0753 07/05/19 0800 07/05/19 0801  BP:  138/81 (!) 149/85 (!) 149/85  Pulse:  91 92 93  Resp:   (!) 27 (!) 28  Temp:   (!) 101.9 F (38.8 C)   TempSrc:   Axillary   SpO2: 90% 92% 92% 92%  Weight:      Height:       Temp (24hrs), Avg:100.4 F (38 C), Min:98.6 F (37 C), Max:101.9 F (38.8 C)  CBC Latest Ref Rng & Units 07/05/2019 07/04/2019 07/03/2019  WBC 4.0 - 10.5 K/uL 10.0 11.1(H) 11.5(H)  Hemoglobin 13.0 - 17.0 g/dL 13.8 13.7 13.9  Hematocrit 39.0 - 52.0 % 43.1 42.6 42.5  Platelets 150 - 400 K/uL 88(L) 68(L) 61(L)   BMP Latest Ref Rng & Units 07/05/2019 07/05/2019 07/05/2019  Glucose 70 - 99 mg/dL - 127(H) -  BUN 6 - 20 mg/dL - 22(H) -  Creatinine 0.61 - 1.24 mg/dL - 1.03 -  Sodium 135 - 145 mmol/L 157(H) 158(H) 160(H)  Potassium 3.5 - 5.1 mmol/L - 3.5 -  Chloride 98 - 111 mmol/L - 125(H) -  CO2 22 - 32 mmol/L - 26 -  Calcium 8.9 - 10.3 mg/dL - 9.2 -    Intake/Output Summary (Last 24 hours) at 07/05/2019 0946 Last data filed at 07/05/2019 0700 Gross per 24 hour  Intake 1849.63 ml  Output 1850 ml  Net -0.37 ml    Current Facility-Administered Medications:  .   stroke: mapping our early stages of recovery book, , Does not apply, Once, Ashok Pall, MD .  0.45 % sodium chloride infusion, , Intravenous, Continuous, Rosalin Hawking, MD, Stopped at 07/05/19 [phone] .  acetaminophen (TYLENOL) tablet 650 mg, 650 mg, Oral, Q4H PRN **OR** acetaminophen (TYLENOL) 160 MG/5ML solution 650 mg, 650 mg, Per Tube, Q4H PRN, 650 mg at 07/05/19 0913 **OR** acetaminophen (TYLENOL) suppository 650 mg, 650 mg, Rectal, Q4H PRN, Deveshwar, Sanjeev, MD .  bisacodyl (DULCOLAX) suppository 10 mg, 10 mg, Rectal, Daily PRN, Merlene Laughter F, NP .  budesonide (PULMICORT) nebulizer solution 0.25 mg, 0.25 mg, Nebulization, BID, Rosana Hoes, Whitney F, NP, 0.25 mg at 07/05/19 0801 .  ceFEPIme  (MAXIPIME) 2 g in sodium chloride 0.9 % 100 mL IVPB, 2 g, Intravenous, Q8H, Agarwala, Ravi, MD, Last Rate: 200 mL/hr at 07/05/19 0700, Rate Verify at 07/05/19 0700 .  chlorhexidine gluconate (MEDLINE KIT) (PERIDEX) 0.12 % solution 15 mL, 15 mL, Mouth Rinse, BID, Ashok Pall, MD, 15 mL at 07/05/19 0858 .  Chlorhexidine Gluconate Cloth 2 % PADS 6 each, 6 each, Topical, Daily, Aroor, Lanice Schwab, MD, 6 each at 07/03/19 2300 .  dexmedetomidine (PRECEDEX) 400 MCG/100ML (4 mcg/mL) infusion, 0.4-1.2 mcg/kg/hr, Intravenous, Titrated, Kipp Brood, MD, Stopped at 07/04/19 1240 .  feeding supplement (PRO-STAT SUGAR FREE 64) liquid 60 mL, 60 mL, Per Tube, QID, Agarwala, Ravi, MD, 60 mL at 07/05/19 0913 .  feeding supplement (VITAL HIGH PROTEIN) liquid 1,000 mL, 1,000 mL, Per Tube, Continuous, Agarwala, Ravi, MD, Last Rate: 25 mL/hr at 07/04/19 1955, 1,000 mL at 07/04/19 1955 .  fentaNYL (SUBLIMAZE) injection 25-100 mcg, 25-100 mcg, Intravenous, Q1H PRN, Agarwala, Ravi, MD, 100 mcg at 07/05/19 0141 .  heparin injection 5,000 Units, 5,000 Units, Subcutaneous, Q8H, Agarwala, Ravi, MD, 5,000 Units at 07/05/19 0631 .  ipratropium-albuterol (DUONEB) 0.5-2.5 (3) MG/3ML nebulizer solution 3 mL, 3 mL, Nebulization, Q6H PRN, Merlene Laughter F, NP, 3 mL at  07/04/19 1109 .  labetalol (NORMODYNE) injection 10-40 mg, 10-40 mg, Intravenous, Q10 min PRN, Ashok Pall, MD, 20 mg at 07/02/19 1302 .  levETIRAcetam (KEPPRA) 100 MG/ML solution 500 mg, 500 mg, Per Tube, BID, Rosalin Hawking, MD, 500 mg at 07/04/19 2208 .  MEDLINE mouth rinse, 15 mL, Mouth Rinse, 10 times per day, Ashok Pall, MD, 15 mL at 07/05/19 0631 .  morphine 2 MG/ML injection 1-2 mg, 1-2 mg, Intravenous, Q2H PRN, Ashok Pall, MD, 2 mg at 07/02/19 0916 .  multivitamin liquid 15 mL, 15 mL, Per Tube, Daily, Rosalin Hawking, MD, 15 mL at 07/05/19 0945 .  naloxone (NARCAN) injection 0.08 mg, 0.08 mg, Intravenous, PRN, Ashok Pall, MD .  norepinephrine (LEVOPHED) 4  mg in dextrose 5 % 250 mL (0.016 mg/mL) infusion, 0-40 mcg/min, Intravenous, Titrated, Sethi, Pramod S, MD .  ondansetron (ZOFRAN) tablet 4 mg, 4 mg, Oral, Q4H PRN **OR** ondansetron (ZOFRAN) injection 4 mg, 4 mg, Intravenous, Q4H PRN, Ashok Pall, MD .  pantoprazole sodium (PROTONIX) 40 mg/20 mL oral suspension 40 mg, 40 mg, Per Tube, Daily, Agarwala, Ravi, MD, 40 mg at 07/05/19 0913 .  polyethylene glycol (MIRALAX / GLYCOLAX) packet 17 g, 17 g, Per Tube, Daily, Rosalin Hawking, MD, 17 g at 07/05/19 0945 .  predniSONE (DELTASONE) tablet 50 mg, 50 mg, Per Tube, Q breakfast, Rosalin Hawking, MD .  promethazine (PHENERGAN) tablet 12.5-25 mg, 12.5-25 mg, Per Tube, Q4H PRN, Aroor, Karena Addison R, MD .  sennosides (SENOKOT) 8.8 MG/5ML syrup 5 mL, 5 mL, Per Tube, Daily, Rosalin Hawking, MD, 5 mL at 07/05/19 0945 .  sodium chloride flush (NS) 0.9 % injection 10-40 mL, 10-40 mL, Intracatheter, Q12H, Greta Doom, MD, 10 mL at 07/04/19 2208 .  sodium chloride flush (NS) 0.9 % injection 10-40 mL, 10-40 mL, Intracatheter, PRN, Greta Doom, MD   Physical Exam: intubated, PERRL with neutral gaze, +c/c/g, moves left side to cues, 0/5 on R with no tone, incision c/d/i  Assessment & Plan: 49 y.o. man w/ L ACA/MCA infarct s/p L hemicraniectomy.  -no change in NSGY plan of care  Judith Part  07/05/19 9:46 AM

## 2019-07-06 ENCOUNTER — Inpatient Hospital Stay (HOSPITAL_COMMUNITY): Payer: Medicaid Other

## 2019-07-06 DIAGNOSIS — Z978 Presence of other specified devices: Secondary | ICD-10-CM

## 2019-07-06 DIAGNOSIS — K567 Ileus, unspecified: Secondary | ICD-10-CM

## 2019-07-06 DIAGNOSIS — Z452 Encounter for adjustment and management of vascular access device: Secondary | ICD-10-CM

## 2019-07-06 DIAGNOSIS — R140 Abdominal distension (gaseous): Secondary | ICD-10-CM

## 2019-07-06 LAB — COMPREHENSIVE METABOLIC PANEL
ALT: 32 U/L (ref 0–44)
AST: 37 U/L (ref 15–41)
Albumin: 2.3 g/dL — ABNORMAL LOW (ref 3.5–5.0)
Alkaline Phosphatase: 40 U/L (ref 38–126)
Anion gap: 10 (ref 5–15)
BUN: 21 mg/dL — ABNORMAL HIGH (ref 6–20)
CO2: 27 mmol/L (ref 22–32)
Calcium: 8.8 mg/dL — ABNORMAL LOW (ref 8.9–10.3)
Chloride: 114 mmol/L — ABNORMAL HIGH (ref 98–111)
Creatinine, Ser: 1.06 mg/dL (ref 0.61–1.24)
GFR calc Af Amer: >60 mL/min (ref >60)
GFR calc non Af Amer: >60 mL/min (ref >60)
Glucose, Bld: 114 mg/dL — ABNORMAL HIGH (ref 70–99)
Potassium: 4.2 mmol/L (ref 3.5–5.1)
Sodium: 151 mmol/L — ABNORMAL HIGH (ref 135–145)
Total Bilirubin: 1.2 mg/dL (ref 0.3–1.2)
Total Protein: 5.5 g/dL — ABNORMAL LOW (ref 6.5–8.1)

## 2019-07-06 LAB — SODIUM
Sodium: 154 mmol/L — ABNORMAL HIGH (ref 135–145)
Sodium: 153 mmol/L — ABNORMAL HIGH (ref 135–145)
Sodium: 155 mmol/L — ABNORMAL HIGH (ref 135–145)

## 2019-07-06 LAB — CBC
HCT: 47.2 % (ref 39.0–52.0)
Hemoglobin: 15.1 g/dL (ref 13.0–17.0)
MCH: 29.6 pg (ref 26.0–34.0)
MCHC: 32 g/dL (ref 30.0–36.0)
MCV: 92.5 fL (ref 80.0–100.0)
Platelets: 93 10*3/uL — ABNORMAL LOW (ref 150–400)
RBC: 5.1 MIL/uL (ref 4.22–5.81)
RDW: 14.6 % (ref 11.5–15.5)
WBC: 10.5 10*3/uL (ref 4.0–10.5)
nRBC: 0.2 % (ref 0.0–0.2)

## 2019-07-06 LAB — URINE CULTURE: Culture: NO GROWTH

## 2019-07-06 LAB — GLUCOSE, CAPILLARY
Glucose-Capillary: 109 mg/dL — ABNORMAL HIGH (ref 70–99)
Glucose-Capillary: 74 mg/dL (ref 70–99)
Glucose-Capillary: 133 mg/dL — ABNORMAL HIGH (ref 70–99)
Glucose-Capillary: 123 mg/dL — ABNORMAL HIGH (ref 70–99)
Glucose-Capillary: 108 mg/dL — ABNORMAL HIGH (ref 70–99)
Glucose-Capillary: 99 mg/dL (ref 70–99)

## 2019-07-06 MED ORDER — SODIUM CHLORIDE 0.9 % IV SOLN: INTRAVENOUS | Status: DC

## 2019-07-06 MED ORDER — DEXTROSE-NACL 5-0.9 % IV SOLN: INTRAVENOUS | Status: DC

## 2019-07-06 MED ORDER — HALOPERIDOL LACTATE 5 MG/ML IJ SOLN
INTRAMUSCULAR | Status: AC
  Filled 2019-07-06: qty 2

## 2019-07-06 MED ORDER — PIVOT 1.5 CAL PO LIQD
1000.0000 mL | ORAL | Status: DC
  Administered 2019-07-06: 19:00:00 1000 mL
  Filled 2019-07-06: qty 1000

## 2019-07-06 MED ORDER — LEVETIRACETAM IN NACL 500 MG/100ML IV SOLN
500.0000 mg | Freq: Two times a day (BID) | INTRAVENOUS | Status: DC
  Administered 2019-07-06 – 2019-07-08 (×5): 500 mg via INTRAVENOUS
  Filled 2019-07-06 (×5): qty 100

## 2019-07-06 MED FILL — Perflutren Lipid Microsphere IV Susp 1.1 MG/ML: INTRAVENOUS | Qty: 10 | Status: AC

## 2019-07-06 NOTE — Progress Notes (Signed)
PT Cancellation Note  Patient Details Name: Lee Hooper MRN: [id number] DOB: October 29, 1970   Cancelled Treatment:    Reason Eval/Treat Not Completed: Medical issues which prohibited therapy pt on 10 of PEEP and 80% of FiO2, RN asked to hold. Pt also with ileus and on bowel rest. Acute PT to return as able ,as appropriate to complete PT eval.  Lewis Shock, PT, DPT Acute Rehabilitation Services Pager #: [phone] Office #: [phone]    Iona Hansen 07/06/2019, 8:40 AM

## 2019-07-06 NOTE — Progress Notes (Signed)
STROKE TEAM PROGRESS NOTE   INTERVAL HISTORY RN at bedside.  Patient still intubated, mildly agitated, left arm and leg spontaneous movement.  Patient still has extended abdomen, concerning for ileus, however having diarrhea this morning.  Currently tube feeding on hold, continue IV fluid.  Sodium 151 this morning, change half saline to normal saline.  Vitals:   07/06/19 1000 07/06/19 1100 07/06/19 1104 07/06/19 1200  BP: (!) 144/86 (!) 147/85 (!) 147/85 129/85  Pulse: (!) 120 (!) 116 (!) 108 (!) 101  Resp: (!) 27 (!) 22 18 (!) 26  Temp:    98.9 F (37.2 C)  TempSrc:    Axillary  SpO2: 92% 91% 92% (!) 89%  Weight:      Height:        CBC:  Recent Labs  Lab 07/01/19 0759 07/01/19 0804 07/02/19 0759 07/02/19 0806 07/05/19 0413 07/06/19 0422  WBC 6.6   < > 11.0*   < > 10.0 10.5  NEUTROABS 4.3  --  9.5*  --   --   --   HGB 15.9   < > 14.9   < > 13.8 15.1  HCT 48.3   < > 44.0   < > 43.1 47.2  MCV 92.2   < > 84.1   < > 89.8 92.5  PLT 166   < > 51*   < > 88* 93*   < > = values in this interval not displayed.    Basic Metabolic Panel:  Recent Labs  Lab 07/03/19 0500 07/03/19 0900 07/03/19 1708 07/03/19 1951 07/05/19 0413 07/05/19 0900 07/06/19 0422 07/06/19 1100  NA 158*   < >  --    < > 158*   < > 151* 154*  K 3.7  --   --    < > 3.5  --  4.2  --   CL 127*  --   --    < > 125*  --  114*  --   CO2 24  --   --    < > 26  --  27  --   GLUCOSE 158*  --   --    < > 127*  --  114*  --   BUN 15  --   --    < > 22*  --  21*  --   CREATININE 0.89   < >  --    < > 1.03  --  1.06  --   CALCIUM 8.6*  --   --    < > 9.2  --  8.8*  --   MG 2.1  --  2.2  --   --   --   --   --   PHOS 1.5*  --  2.5  --   --   --   --   --    < > = values in this interval not displayed.   Lipid Panel:     Component Value Date/Time   CHOL 92 07/02/2019 0431   TRIG 137 07/03/2019 0500   HDL 30 (L) 07/02/2019 0431   CHOLHDL 3.1 07/02/2019 0431   VLDL 17 07/02/2019 0431   LDLCALC 45 07/02/2019  0431   HgbA1c:  Lab Results  Component Value Date   HGBA1C 5.5 07/02/2019   Urine Drug Screen:     Component Value Date/Time   LABOPIA NONE DETECTED 07/01/2019 1930   COCAINSCRNUR NONE DETECTED 07/01/2019 1930   LABBENZ POSITIVE (A) 07/01/2019 1930   AMPHETMU  NONE DETECTED 07/01/2019 1930   THCU NONE DETECTED 07/01/2019 1930   LABBARB NONE DETECTED 07/01/2019 1930    Alcohol Level     Component Value Date/Time   ETH <10 07/01/2019 0759    IMAGING past 24 hours DG Chest Port 1 View  Result Date: 07/06/2019 CLINICAL DATA:  History of stroke. EXAM: PORTABLE CHEST 1 VIEW COMPARISON:  07/05/2019; 07/03/2019 FINDINGS: Stable positioning of support apparatus. No pneumothorax. Unchanged small layering bilateral effusions and associated bibasilar heterogeneous/consolidative opacities. Pulmonary vasculature appears less distinct than present examination. No acute osseous abnormalities. IMPRESSION: 1.  Stable positioning of support apparatus.  No pneumothorax. 2. Suspected mild pulmonary edema with small layering effusions and associated bibasilar opacities, atelectasis versus infiltrate/aspiration. Electronically Signed   By: Simonne Come M.D.   On: 07/06/2019 08:18   PHYSICAL EXAM  Temp:  [98.9 F (37.2 C)-100.7 F (38.2 C)] 98.9 F (37.2 C) (05/03 1200) Pulse Rate:  [85-122] 101 (05/03 1200) Resp:  [17-29] 26 (05/03 1200) BP: (120-151)/(77-108) 129/85 (05/03 1200) SpO2:  [89 %-97 %] 89 % (05/03 1200) FiO2 (%):  [70 %-100 %] 80 % (05/03 1200) Weight:  [882 kg] 112 kg (05/03 0500)  General - Well nourished, well developed, intubated off precedex. Dressing wrapped around head.   Ophthalmologic - fundi not visualized due to noncooperation.  Cardiovascular - Regular rate and rhythm, intermittent tachycardia.  Abdomen - extended, tympanic sound on percussion  Neuro - intubated not on sedation, eyes closed but easily open on voice, not following commands. With eye opening, eyes in  left gaze preference position, not able to cross midline, not blinking to visual threat on the right but able to blink on the left, doll's eyes present, PERRL. Corneal reflex weak on the right and brisk on the left, gag and cough present. Breathing over the vent.  Facial symmetry not able to test due to ET tube.  Tongue protrusion not cooperative. On pain stimulation, left UE 3+/5 and LE 3-/5 with purposeful movement and localizing to pain. However, right UE and LE plegic. DTR 1+ and no babinski. Sensation, coordination and gait not tested.   ASSESSMENT/PLAN Lee Hooper is a 49 y.o. male with history of COPD, ongoing tobacco use, and psoriasis, presenting with dense LMCA syndrome. Attempt for Left ICA and Left M1 thrombectomy was done emergently with TICI 2b flow. Unfortunately, the pt had post procedure hemorrhage and reocclusion of the vessels. F/u imaging suggested early edema and pt underwent prophylactic decompressive hemicraniectomy.  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  Stroke etiology unknown  Code Stroke CT head showed early hypodensity in nearly entire LMCA. ASPECTS 6-7 per notes.  CTA head & neck occlusion cervical ICA on the left. This is most likely due to dissection. The patient has minimal atherosclerotic disease.  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  There is poor collateral circulation on the left.   CTP CBF large core volume of 266 mL, perfusion volume 313 mL, mismatch volume 47 mL.  ASPECTS using CTP by Dr. Chestine Spore 6 or 7.   IR - TICI2c revascularization  MRI  Restricted diffusion throughout the majority of the left MCA and ACA  vascular territories  consistent with acute ischemia.  MRA - reocclusion of the intracranial left internal carotid artery. No flow related signal is seen within the left middle cerebral artery.  CT Head 07/04/19 - Slightly increased rightward midline shift with a small amount of herniation beneath the anterior falx. Otherwise unchanged examination.  2D Echo - EF 55 - 60%. No cardiac source of emboli identified.   LDL 45  HgbA1c 5.5  subq heparin for VTE prophylaxis  No antithrombotic prior to admission, now on none d/t hemorrhagic conversion  Therapy recommendations:  pending  Disposition:  pending  Respiratory failure  Intubated on vent  CCM on board  CXR 07/03/19- New consolidation in the right lower lobe and left infrahilar region concerning for pneumonia. Possible small layering right effusion.  CXR 5/3 mild pulm edema w/ atx vs infiltrate/aspiration  On cefepime 4/30>>5/2   for pneumonia  Augmentin 5/2>>   COPD exacerbation   on prednisone  Cerebral edema  MRI  Restricted diffusion throughout the majority of the left MCA and ACA vascular territories consistent with acute ischemia.  CT Head 07/04/19 - Slightly increased rightward midline shift with a small amount of herniation beneath the anterior falx. Otherwise unchanged examination.  S/p [facility] 4/28  On keppra  Na 158->159->160->158->156->151->154  Goal Na 155-160  On NS @ 75  Na Q6h  Fever  HCAP  Tmax 100.4->100.8->100.5->101.9  Leukocytosis - 6.6->11.0->11.5->11.1->10.0->10.5  CXR 07/03/19- New consolidation in the right lower lobe and left infrahilar region concerning for pneumonia. Possible small layering right effusion.  UA neg  On cefepime 4/30>>5/2  Augmentin 5/2>>   Blood culture no growth < 24h   Hypertension  Home meds:  None  Current meds: Normodyne prn ; Levophed prn  stable  SBP goal < 160 due to hemorrhagic conversion . Long-term BP goal normotensive  Dysphagia   NPO  On TF @ 25 ->  50->stopped d/t ileus->trickle feeds  Changed IV to D5NS  Dietitian on board  Ileus  abd distension w/ decreased BS  KUB 5/2 distended colonic loop  TF on hold -> trickle feeds  miralax   flexiseal  K 4.2  Tobacco abuse  Current smoker  Smoking cessation counseling will be provided if able  Other Stroke Risk Factors  Obesity, Body mass index is 33.49 kg/m., recommend weight loss, diet and exercise as appropriate   Other Active Problems  Code Status - Full code  Hypokalemia - 3.3 -> supplement and rechecck->3.5->4.2  Bradycardia - mild  Thrombocytopenia - 166->51->61->68->88->93 - monitor - improving  Hospital day # 5  This patient is critically ill and at significant risk of neurological worsening, death and care requires constant monitoring of vital signs, hemodynamics,respiratory and cardiac monitoring, extensive review of multiple databases, frequent neurological assessment, discussion with family, other specialists and medical decision making of high complexity. I spent 35 minutes of neurocritical care time  in the care of  this patient. I had long discussion with pt daughter over the phone, updated pt current condition, treatment plan and potential prognosis, and answered all the questions. She expressed understanding and appreciation.    Marvel Plan, MD PhD Stroke Neurology 07/06/2019 3:31 PM  To contact Stroke Continuity provider, please refer to [email or web address]. After hours, contact General Neurology

## 2019-07-06 NOTE — Progress Notes (Signed)
Patient ID: Lee Hooper, male   DOB: August 17, 1970, 49 y.o.   MRN: [id number] BP 122/69   Pulse 95   Temp 100.1 F (37.8 C) (Axillary)   Resp (!) 23   Ht 6' (1.829 m)   Wt 112 kg   SpO2 96%   BMI 33.49 kg/m  Opens eyes to voice Spontaneous movements on left, localizes Most like aphasic, and most likely reason for him not following commands Wound is clean, dry, without signs of infection Stable exam.

## 2019-07-06 NOTE — Progress Notes (Signed)
NAME:  Lee Hooper, MRN:  [id number], DOB:  1970-07-16, LOS: 5 ADMISSION DATE:  07/01/2019, CONSULTATION DATE:  07/01/19 REFERRING MD:  Aroor MD, CHIEF COMPLAINT:  CVA    Brief History   Kadarious Dikes is a 49 yo M who presented as a code stroke after being found aphasic and nonverbal by his wife. CT/CTA studies showed left ICA/MCA thrombosis and pt was taken for IR thrombectomy for left ICA and left M1 occlusions. Repeat head CT was obtained following the procedure out of concern for possible hemorrhage and showed moderate basal ganglia hemorrhage vs stroke. Repeat MRI revealed that the ICA and MCA re-occluded with change in FLAIR sequence excluding him from repeat thrombectomy. He underwent a L hemicraniectomy. He was intubated for airway protection. PCCM consulted for vent management.  Past Medical History  Psoriasis  COPD  Tobacco Use  Significant Hospital Events   4/28: Admit; L ICA/MCA Thrombectomy with re-occlusion --> left hemicraniectomy  Consults:  IR PCCM  Neuro (Primary) Neurosurgery  Procedures:  4/28 IR - L ICA/MCA Thrombectomy 4/28 L hemicraniectomy  Significant Diagnostic Tests:  4/28 Admit CT Head Code Stroke:  Hyperdense left ICA and MCA compatible with acute thrombus. No acute infarct or hemorrhage.  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 The patient has minimal atherosclerotic disease. The left internal carotid artery is occluded through the terminus and extending in the left M1 M2 and M3 branches. There is poor collateral circulation on the left. There is a large territory infarct involving the left hemisphere. Infarct volume 266 mL.  4/28 Admit MR Brain:  Restricted diffusion throughout much of the left MCA vascular territory consistent with acute ischemia. Restricted diffusion consistent with acute ischemia is also present within the paramedian left  frontoparietal lobes, although this is less well assessed due to the degree of motion degradation at the level of the vertex. There is little if any corresponding T2/FLAIR hyperintensity at these sites. No significant mass effect. No midline shift.  4/28 Post Thrombectomy CT Head: : 1. 3.0 x 2.6 x 3.9 cm region of hyperdensity centered within the left basal ganglia, left subinsular region and inferomedial left temporal lobe likely reflecting a combination of parenchymal hematoma and contrast staining.  2. Edema with loss of gray-white differentiation within the left basal ganglia, left insula and anterior left temporal lobe, likely acute infarction. Subtle changes of acute infarction are also suspected within the paramedian left frontal lobe ACA vascular territory. 3. Scattered small-volume subarachnoid hemorrhage along the left cerebral hemisphere. 4. Regional mass effect with effacement of the left lateral ventricle temporal horn. No midline shift.  4/28 Post Thrombectomy MR Brain:  1. Restricted diffusion throughout the majority of the left MCA and ACA vascular territories consistent with acute ischemia. 2. No significant mass effect at this time. No midline shift. 3. The acute parenchymal hemorrhage and/or contrast staining centered within left basal ganglia and inferomedial left temporal lobe on prior head CT does not appear significantly changed. Based on the MR appearance, it is suspected that a significant component of this previously demonstrated hyperdensity reflects contrast staining. 4. Redemonstrated small volume subarachnoid hemorrhage overlying the left cerebral hemisphere. Small volume subarachnoid hemorrhage is also questioned along the right cerebral hemisphere posteriorly.  4/29 Post Hemicraniectomy CT head:  Complete left ACA/MCA territory infarction with swollen brain bulging through craniectomy defect. No midline shift or entrapment. Petechial hemorrhage at left basal  ganglia. Extraaxial hemorrhage along surface of the infarct.   4/30 CXR: increasing opacification of R lower lung field. (personal interpretation)  5/1 Head CT > Slightly increased rightward midline shift with a small amount of herniation beneath the anterior falx. Otherwise unchanged Examination.  Micro Data:  4/28: SARS-CoV-2/Influenza A/Influenza B PCR: Negative  4/30: Respiratory culture: Abundant Moraxella Catarrhalis, Haemophilus Influenzae, beta lactamase positive  5/2: Urine culture: NGTD  5/2: BC x2: NGTD    Antimicrobials:  4/30 - 5/2: Cefepime 5/2 - present: Augmentin  Interim history/subjective:  5/3: NAE. Was taken off precedex yesterday and pt opened his eyes. Prednisone 50 mg qd was begun. He had a persistent fever around 38.2, HR 110s, BP 140s/60s. Pt required increased O2 with secretions; CXR showed mild edema with small effusions c/f aspiration vs opacification. Blood culture x2 and urine culture collected, NGTD. ABG pending. Currently satting 92% on PRVC FiO2 80, PEEP 10, Plateau 22. WBC declined to 10.5 from 11.1, Na 151 from 156. Tube feeds were held d/t distended colonic loop in RLQ on KUB, to be continued today. Thrombocytopenia improved to 93 from 81.   Objective   Blood pressure (!) 147/85, pulse (!) 108, temperature (!) 100.7 F (38.2 C), temperature source Axillary, resp. rate 18, height 6' (1.829 m), weight 112 kg, SpO2 92 %.    Vent Mode: PRVC FiO2 (%):  [70 %-100 %] 80 % Set Rate:  [16 bmp] 16 bmp Vt Set:  [620 mL] 620 mL PEEP:  [10 cmH20] 10 cmH20 Plateau Pressure:  [19 cmH20-24 cmH20] 22 cmH20   Intake/Output Summary (Last 24 hours) at 07/06/2019 1130 Last data filed at 07/06/2019 1015 Gross per 24 hour  Intake 1347.97 ml  Output 2200 ml  Net -852.03 ml   Filed Weights   07/04/19 0500 07/05/19 0359 07/06/19 0500  Weight: 114.3 kg 113 kg 112 kg    Examination: Gen: Ill appearing in NAD, intubated and on ventilation. HEENT: ETT in place,  PERRLA, MMM.   Neck: no cervical lymphadenopathy, no JVD Heart: RRR, S1, S2, no M/R/G, no chest wall tenderness Lungs: CTAB, no crackles or wheezes Abdomen: Normoactive bowel sounds, soft, ND, no rebound/guarding Extremities: no clubbing, cyanosis, ++ edema: pulses are +2 in bilateral upper and lower extremities Neuro: Unresponsive. Off sedation Skin:  No rashes,+ multiple scaling plaques noted on flexor surfaces and abdomen  Resolved Hospital Problem list   none  Assessment & Plan:  49 yo with h/o COPD and tobacco use presented with large L MCA stroke s/p IR thrombectomy and subsequent hemorrhage s/p hemicraniectomy.   Acute hypoxic respiratory failure requiring mechanical ventilation: Pt was intubated on 4/28 for acute respiratory failure. Likely related to his CVA. MRI brain 5/1 revealed Slightly increased rightward midline shiftwith a small amount ofherniation beneath the anterior falx. New consolidation on CXR on 4/30 highly c/f HCAP. He is still on relatively high settings with FiO2 80%, PEEP 10, Plat 22. He had increasing O2 requirements yesterday; f/u CXR showed stable airspace disease, ABG pending.  - Continue Vent support - PNA coverage as below   -augmentin - VAP protocol in place - Wean PEEP and FiO2 for sats greater than 90%.  Pneumonia: New consolidation on 4/30 highly c/f CAP vs HCAP. Cultures grew beta-lactamase positive Moraxella catarrhalis and Haemophilus influenzae. Initially on cefepime now narrowed to Augmentin. Pt still has persistent low grade fever to 38.2 but WBC downtrending (despite prednisone).  - Continue Augmentin   Wheezing c/f COPD exacerbation: Pt has had persistent wheezing  throughout his hospitalization. Prednisone started on 5/2 with c/f COPD exacerbation. Wheezing improved today.  - Continue BDs - Continue prednisone   - Continue vent support as above   Large Left ICA/MCA CVA s/p IR thrombectomy and left decompressive hemicraniectomy   significant cerebral edema: Pt presented on 4/28 with aforementioned stroke and was taken for IR thrombectomy for left ICA and left M1 occlusions. Repeat head CT was obtained following the procedure out of concern for possible hemorrhage and showed moderate basal ganglia hemorrhage vs stroke. Repeat MRI revealed that the ICA and MCA re-occluded with change in FLAIR sequence excluding him from repeat thrombectomy. Neurology primary team and managing.  - Management per neurology  - Maintain neuro protective measures; goal for eurothermia, euglycemia, eunatermia, normoxia - Nutrition and bowel regimen  - Seizure precautions  - AEDs per neurology - Allow for hypernatremia, Na goal 155-160  Ileus: Pt began to have abdominal distension and decreased bowel sounds on 5/2; KUB showed distended colonic loop in RLQ, likely at the cecum. Tube feeds were held. Pt continues to have anal leakage.  - Flexiseal  -resume tf today at low rate with+ bms -repeat kub in am  FEN: Pt has hypernatremia throughout hospitalization, goal 155-160 per neurology but most recently 151 from 155. Also has had hypokalemia; repleted and most recent K 4.2.  - Management per neurology  - Follow Na q6hrs  - Supplement K as needed; none today  Best practice:  Diet: Tube Feeds  Pain/Anxiety/Delirium protocol (if indicated): minimize VAP protocol (if indicated):per protocol DVT prophylaxis: SCDs GI prophylaxis: Protonix Glucose control: SSI Mobility: Bedrest Code Status: FULL Family Communication: per primary team  Disposition: ICU   Labs   CBC: Recent Labs  Lab 07/01/19 0759 07/01/19 0804 07/02/19 0759 07/02/19 0806 07/03/19 0500 07/03/19 0900 07/04/19 0500 07/05/19 0413 07/06/19 0422  WBC 6.6   < > 11.0*   < > 11.0* 11.5* 11.1* 10.0 10.5  NEUTROABS 4.3  --  9.5*  --   --   --   --   --   --   HGB 15.9   < > 14.9   < > 13.9 13.9 13.7 13.8 15.1  HCT 48.3   < > 44.0   < > 42.2 42.5 42.6 43.1 47.2  MCV 92.2   <  > 84.1   < > 87.4 88.9 88.2 89.8 92.5  PLT 166   < > 51*   < > 61* 61* 68* 88* 93*   < > = values in this interval not displayed.    Basic Metabolic Panel: Recent Labs  Lab 07/02/19 0759 07/02/19 0806 07/02/19 1832 07/02/19 1951 07/02/19 2308 07/03/19 0151 07/03/19 0500 07/03/19 0500 07/03/19 0900 07/03/19 1351 07/03/19 1708 07/03/19 1951 07/04/19 0500 07/04/19 0835 07/05/19 0413 07/05/19 0900 07/05/19 1505 07/05/19 2008 07/06/19 0422  NA 149*   < >  --    < > 157*   < > 158*   < > 158*   < >  --    < > 158*   < > 158* 157* 157* 156* 151*  K 4.2   < >  --   --  3.6  --  3.7  --   --   --   --   --  3.3*  --  3.5  --   --   --  4.2  CL 118*  --   --   --   --   --  127*  --   --   --   --   --  125*  --  125*  --   --   --  114*  CO2 23  --   --   --   --   --  24  --   --   --   --   --  27  --  26  --   --   --  27  GLUCOSE 160*  --   --   --   --   --  158*  --   --   --   --   --  132*  --  127*  --   --   --  114*  BUN 8  --   --   --   --   --  15  --   --   --   --   --  19  --  22*  --   --   --  21*  CREATININE 0.97   < >  --   --   --   --  0.89  --  1.00  --   --   --  1.01  --  1.03  --   --   --  1.06  CALCIUM 8.4*  --   --   --   --   --  8.6*  --   --   --   --   --  9.1  --  9.2  --   --   --  8.8*  MG 1.8  --  1.7  --   --   --  2.1  --   --   --  2.2  --   --   --   --   --   --   --   --   PHOS 2.3*  --  1.6*  --   --   --  1.5*  --   --   --  2.5  --   --   --   --   --   --   --   --    < > = values in this interval not displayed.   GFR: Estimated Creatinine Clearance: 107.8 mL/min (by C-G formula based on SCr of 1.06 mg/dL). Recent Labs  Lab 07/03/19 0900 07/04/19 0500 07/05/19 0413 07/06/19 0422  WBC 11.5* 11.1* 10.0 10.5    Liver Function Tests: Recent Labs  Lab 07/02/19 0759 07/03/19 0500 07/04/19 0500 07/05/19 0413 07/06/19 0422  AST 28 30 27 28  37  ALT 34 35 29 30 32  ALKPHOS 34* 34* 36* 40 40  BILITOT 0.8 0.6 0.8 0.7 1.2  PROT  5.1* 4.9* 5.4* 5.6* 5.5*  ALBUMIN 3.0* 2.7* 2.6* 2.5* 2.3*   No results for input(s): LIPASE, AMYLASE in the last 168 hours. No results for input(s): AMMONIA in the last 168 hours.  ABG    Component Value Date/Time   PHART 7.377 07/02/2019 2308   PCO2ART 43.4 07/02/2019 2308   PO2ART 91 07/02/2019 2308   HCO3 25.4 07/02/2019 2308   TCO2 27 07/02/2019 2308   ACIDBASEDEF 3.0 (H) 07/02/2019 0806   O2SAT 97.0 07/02/2019 2308     Coagulation Profile: Recent Labs  Lab 07/01/19 0759 07/01/19 1821  INR 1.0 1.4*    Cardiac Enzymes: No results for input(s): CKTOTAL, CKMB, CKMBINDEX, TROPONINI in the last 168 hours.  HbA1C: Hgb A1c MFr Bld  Date/Time Value Ref Range Status  07/02/2019 04:31 AM 5.5 4.8 - 5.6 %  Final    Comment:    (NOTE) Pre diabetes:          5.7%-6.4% Diabetes:              >6.4% Glycemic control for   <7.0% adults with diabetes     CBG: Recent Labs  Lab 07/05/19 1950 07/05/19 2326 07/06/19 0332 07/06/19 0746 07/06/19 1121  GLUCAP 110* 118* 109* 74 133*     The patient is critically ill with multiple organ systems failure and requires high complexity decision making for assessment and support, frequent evaluation and titration of therapies, application of advanced monitoring technologies and extensive interpretation of multiple databases.  Critical care time 36 mins. This represents my time independent of the NP's/PA's/med student/residents time taking care of the pt. This is excluding procedures   Briant Sites DO [facility] 07/06/2019, 1:38 PM

## 2019-07-06 NOTE — Progress Notes (Signed)
OT Cancellation Note  Patient Details Name: REFORD OLLIFF MRN: [id number] DOB: Jun 09, 1970   Cancelled Treatment:    Reason Eval/Treat Not Completed: (P) Patient not medically ready  Jamelle Noy,HILLARY 07/06/2019, 9:14 AM  Luisa Dago, OT/L   Acute OT Clinical Specialist Acute Rehabilitation Services Pager [phone] Office [phone]

## 2019-07-06 NOTE — Progress Notes (Signed)
Nutrition Follow-up  DOCUMENTATION CODES:   Obesity unspecified  INTERVENTION:   Initiate Vital High Protein @ 10 ml/hr via OG tube MVI daily  Provides: 240 kcal, 21 grams protein  As able recommend advance to goal:  Vital High Protein @ 50 ml/hr (1200 ml/day) 60 ml Prostat BID  Provides: 1600 kcal, 165 grams protein, and 1003 ml free water.   NUTRITION DIAGNOSIS:   Inadequate oral intake related to inability to eat as evidenced by NPO status. Ongoing.   GOAL:   Patient will meet greater than or equal to 90% of their needs Not met.   MONITOR:   TF tolerance, I & O's  REASON FOR ASSESSMENT:   Consult, Ventilator Enteral/tube feeding initiation and management  ASSESSMENT:   Pt with PMH of smoking and COPD admitted with L ICA/MCA s/p IR for revascularization however pt had re-occlusion now s/p emergent L hemicraniectomy.   Pt discussed during ICU rounds and with RN.  Per CCM notes pt with new PNA with high vent settings Per RN pt developed abd distention on 5/2. KUB showed distended colonic loop in RLQ likely at cecum. TF held. Per CCM ok to resume trickle.   4/30 cortrak placed; tip gastric 5/2 TF held due to ileus 5/3 TF resume at trickle   Patient is currently intubated on ventilator support MV: 12.5 L/min Temp (24hrs), Avg:99.6 F (37.6 C), Min:98.9 F (37.2 C), Max:100.7 F (38.2 C)  Medications reviewed and include: MVI, miralax, prednisone, senokot  Labs reviewed: Na 154 (H)   Diet Order:   Diet Order            Diet NPO time specified  Diet effective now              EDUCATION NEEDS:   No education needs have been identified at this time  Skin:  Skin Assessment: Reviewed RN Assessment  Last BM:  unknown  Height:   Ht Readings from Last 1 Encounters:  07/02/19 6' (1.829 m)    Weight:   Wt Readings from Last 1 Encounters:  07/06/19 112 kg    Ideal Body Weight:  80.9 kg  BMI:  Body mass index is 33.49 kg/m.  Estimated  Nutritional Needs:   Kcal:  1400-1600  Protein:  >161 grams  Fluid:  >1.5 L/day  Lockie Pares., RD, LDN, CNSC See AMiON for contact information

## 2019-07-06 NOTE — Progress Notes (Signed)
eLink Physician-Brief Progress Note Patient Name: Lee Hooper DOB: May 11, 1970 MRN: [id number]   Date of Service  07/06/2019  HPI/Events of Note  Notified of increasing O2 requirement with increased secretions  eICU Interventions  CXR and ABG ordered     Intervention Category Major Interventions: Hypoxemia - evaluation and management  Darl Pikes 07/06/2019, 6:19 AM

## 2019-07-07 ENCOUNTER — Inpatient Hospital Stay (HOSPITAL_COMMUNITY): Payer: Medicaid Other

## 2019-07-07 LAB — BASIC METABOLIC PANEL
Anion gap: 11 (ref 5–15)
BUN: 21 mg/dL — ABNORMAL HIGH (ref 6–20)
CO2: 25 mmol/L (ref 22–32)
Calcium: 8.8 mg/dL — ABNORMAL LOW (ref 8.9–10.3)
Chloride: 116 mmol/L — ABNORMAL HIGH (ref 98–111)
Creatinine, Ser: 1.01 mg/dL (ref 0.61–1.24)
GFR calc Af Amer: >60 mL/min (ref >60)
GFR calc non Af Amer: >60 mL/min (ref >60)
Glucose, Bld: 140 mg/dL — ABNORMAL HIGH (ref 70–99)
Potassium: 4.7 mmol/L (ref 3.5–5.1)
Sodium: 152 mmol/L — ABNORMAL HIGH (ref 135–145)

## 2019-07-07 LAB — GLUCOSE, CAPILLARY
Glucose-Capillary: 110 mg/dL — ABNORMAL HIGH (ref 70–99)
Glucose-Capillary: 94 mg/dL (ref 70–99)
Glucose-Capillary: 126 mg/dL — ABNORMAL HIGH (ref 70–99)
Glucose-Capillary: 108 mg/dL — ABNORMAL HIGH (ref 70–99)
Glucose-Capillary: 104 mg/dL — ABNORMAL HIGH (ref 70–99)
Glucose-Capillary: 100 mg/dL — ABNORMAL HIGH (ref 70–99)

## 2019-07-07 LAB — POCT I-STAT 7, (LYTES, BLD GAS, ICA,H+H)
Acid-Base Excess: 2 mmol/L (ref 0.0–2.0)
Bicarbonate: 26.5 mmol/L (ref 20.0–28.0)
Calcium, Ion: 1.35 mmol/L (ref 1.15–1.40)
HCT: 37 % — ABNORMAL LOW (ref 39.0–52.0)
Hemoglobin: 12.6 g/dL — ABNORMAL LOW (ref 13.0–17.0)
O2 Saturation: 98 %
Patient temperature: 99.5
Potassium: 3.3 mmol/L — ABNORMAL LOW (ref 3.5–5.1)
Sodium: 153 mmol/L — ABNORMAL HIGH (ref 135–145)
TCO2: 28 mmol/L (ref 22–32)
pCO2 arterial: 42 mmHg (ref 32.0–48.0)
pH, Arterial: 7.41 (ref 7.350–7.450)
pO2, Arterial: 106 mmHg (ref 83.0–108.0)

## 2019-07-07 LAB — SODIUM
Sodium: 154 mmol/L — ABNORMAL HIGH (ref 135–145)
Sodium: 151 mmol/L — ABNORMAL HIGH (ref 135–145)
Sodium: 149 mmol/L — ABNORMAL HIGH (ref 135–145)

## 2019-07-07 LAB — MAGNESIUM: Magnesium: 2.5 mg/dL — ABNORMAL HIGH (ref 1.7–2.4)

## 2019-07-07 MED ORDER — AMOXICILLIN-POT CLAVULANATE 875-125 MG PO TABS
1.0000 | ORAL_TABLET | Freq: Two times a day (BID) | ORAL | Status: DC
  Administered 2019-07-07: 21:00:00 1
  Filled 2019-07-07 (×2): qty 1

## 2019-07-07 MED ORDER — POTASSIUM CHLORIDE 10 MEQ/100ML IV SOLN
10.0000 meq | INTRAVENOUS | Status: AC
  Administered 2019-07-07 (×6): 10 meq via INTRAVENOUS
  Filled 2019-07-07: qty 100

## 2019-07-07 MED ORDER — METOCLOPRAMIDE HCL 5 MG/ML IJ SOLN
5.0000 mg | Freq: Two times a day (BID) | INTRAMUSCULAR | Status: DC
  Administered 2019-07-07 (×2): 5 mg via INTRAVENOUS
  Filled 2019-07-07 (×3): qty 2

## 2019-07-07 NOTE — Evaluation (Signed)
Occupational Therapy Evaluation Patient Details Name: Lee Hooper MRN: [id number] DOB: 1970-08-03 Today's Date: 07/07/2019    History of Present Illness This 49 y.o. male admitted 4/28 with Rt sided hemiplegia and aphasia.  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  Repeat CT following procedure showed moderate basal ganglia hemorrhage vs. stroke   Repeat MRI revealed that the ICA and MCA re-occluded with change in FLAIR sequence excluding him from repeat thrombectomy.  He underwent a decompressive hemicraniectomy.  He waws intubated 4/28 and remains intubated.  He developed an ileus 5/2.  PMH includes: COPD, tobacco abuse, psoriasis   Clinical Impression   Pt admitted with above. He demonstrates the below listed deficits and will benefit from continued OT to maximize safety and independence with BADLs.  Pt seen in conjunction with PT.  He was moved into chair/egress position in the bed with RR increasing into the 40s initially, but improved with RR in the 20s after he was suctioned.  He does not follow any commands.  He appears to have Rt neglect, and dense Rt hemiplegia.  He currently requires total A for all aspects of ADLs and mobility.  Per RN, pt lives with wife and was fully independent PTA.  Anticipate he will require extensive rehab and a significant amount of assist at discharge.  Anticipate he will requires SNF vs LTACH at discharge.  Will follow acutely.       Follow Up Recommendations  Other (comment);SNF(dependent on his ability to wean and progress )    Equipment Recommendations  None recommended by OT    Recommendations for Other Services       Precautions / Restrictions Precautions Precautions: Fall;Other (comment) Precaution Comments: Rt hemiplegia, likely aphasia       Mobility Bed Mobility               General bed mobility comments: Pt moved into egress position in the bed.  He requires total A to shift weight and reposition    Transfers                 General transfer comment: unable to attempt     Balance Overall balance assessment: Needs assistance Sitting-balance support: Single extremity supported Sitting balance-Leahy Scale: Poor Sitting balance - Comments: with bed in egress chair position, pt maintains supported sitting with supervision, but does lean to Rt requiring total assist to reposition                                    ADL either performed or assessed with clinical judgement   ADL Overall ADL's : Needs assistance/impaired Eating/Feeding: NPO                                     General ADL Comments: Pt requires total A for all aspects      Vision   Additional Comments: Pt will occasionally open eyes.  he will look to therapist on the Lt, but not on the Rt      Perception Perception Perception Tested?: Yes Perception Deficits: Inattention/neglect Inattention/Neglect: Does not attend to right visual field;Does not attend to right side of body   Praxis Praxis Praxis-Other Comments: unable to accurately assess     Pertinent Vitals/Pain Pain Assessment: Faces Faces Pain Scale: No hurt     Hand  Dominance (Unsure )   Extremity/Trunk Assessment Upper Extremity Assessment Upper Extremity Assessment: LUE deficits/detail;RUE deficits/detail RUE Deficits / Details: No spontaneous movement noted Rt UE.  PROM WFL elbow distally, shoulder WFL to ~90*  RUE Sensation: decreased light touch;decreased proprioception RUE Coordination: decreased fine motor;decreased gross motor LUE Deficits / Details: pt spontaneously moving Lt UE    Lower Extremity Assessment Lower Extremity Assessment: Defer to PT evaluation   Cervical / Trunk Assessment Cervical / Trunk Assessment: Other exceptions Cervical / Trunk Exceptions: Rt sided weakness    Communication Communication Communication: Other (comment);Receptive difficulties(ETT tube, probable aphasia )    Cognition Arousal/Alertness: Awake/alert;Lethargic Behavior During Therapy: Flat affect Overall Cognitive Status: Difficult to assess                                 General Comments: Pt does not follow commands.  He opens eyes intermittently, and will look to therapist on his Lt, but not on Rt.  If hand/fingers placed in position to hold up thumb or two fingers, he will repeat this frequently - possible perseveration?    General Comments  Pt with ETT Fi02 60%, PEEP 10.  Pt initially with RR into the 40s upon moving into upright position, and pt with copious secretions.  RT in to suction pt with RR decreasing into the 20s.  HR low 100s, and 02 sats >93%    Exercises Exercises: Other exercises Other Exercises Other Exercises: PROM of Rt UE x 3 reps hand, elbow, shoulder    Shoulder Instructions      Home Living Family/patient expects to be discharged to:: Unsure                                 Additional Comments: Pt unable to provide info and no family available       Prior Functioning/Environment Level of Independence: Independent        Comments: Per RN, pt was independent, drives, and works as a Careers adviser Problem List: Decreased strength;Decreased range of motion;Decreased activity tolerance;Impaired balance (sitting and/or standing);Decreased coordination;Impaired vision/perception;Decreased cognition;Decreased safety awareness;Decreased knowledge of use of DME or AE;Decreased knowledge of precautions;Cardiopulmonary status limiting activity;Impaired sensation;Impaired tone;Obesity;Impaired UE functional use      OT Treatment/Interventions: Self-care/ADL training;Neuromuscular education;DME and/or AE instruction;Therapeutic activities;Energy conservation;Manual therapy;Splinting;Cognitive remediation/compensation;Visual/perceptual remediation/compensation;Patient/family education;Balance training    OT Goals(Current goals can be found  in the care plan section) Acute Rehab OT Goals OT Goal Formulation: Patient unable to participate in goal setting Time For Goal Achievement: 07/21/19 Potential to Achieve Goals: Fair  OT Frequency: Min 2X/week   Barriers to D/C: Decreased caregiver support  family unable to provide necessary level of assist        Co-evaluation PT/OT/SLP Co-Evaluation/Treatment: Yes Reason for Co-Treatment: Complexity of the patient's impairments (multi-system involvement);Necessary to address cognition/behavior during functional activity;For patient/therapist safety   OT goals addressed during session: Strengthening/ROM      AM-PAC OT "6 Clicks" Daily Activity     Outcome Measure Help from another person eating meals?: Total Help from another person taking care of personal grooming?: Total Help from another person toileting, which includes using toliet, bedpan, or urinal?: Total Help from another person bathing (including washing, rinsing, drying)?: Total Help from another person to put on and taking off regular upper body clothing?: Total Help from another  person to put on and taking off regular lower body clothing?: Total 6 Click Score: 6   End of Session Equipment Utilized During Treatment: Oxygen(vent ) Nurse Communication: Mobility status  Activity Tolerance: Treatment limited secondary to medical complications (Comment)(respiratory status ) Patient left: in bed  OT Visit Diagnosis: Unsteadiness on feet (R26.81);Cognitive communication deficit (R41.841);Hemiplegia and hemiparesis Symptoms and signs involving cognitive functions: Cerebral infarction Hemiplegia - Right/Left: Right Hemiplegia - dominant/non-dominant: Dominant Hemiplegia - caused by: Cerebral infarction                Time: 6578-4696 OT Time Calculation (min): 29 min Charges:  OT General Charges $OT Visit: 1 Visit OT Evaluation $OT Eval High Complexity: 1 High  Nilsa Nutting., OTR/L Acute Rehabilitation Services Pager  [phone] Office [phone]   Lucille Passy M 07/07/2019, 2:02 PM

## 2019-07-07 NOTE — Progress Notes (Signed)
Patient ID: Lee Hooper, male   DOB: 1971-02-24, 49 y.o.   MRN: [id number] BP 124/81   Pulse 79   Temp 98.8 F (37.1 C) (Axillary)   Resp (!) 26   Ht 6' (1.829 m)   Wt 112 kg   SpO2 100%   BMI 33.49 kg/m  Opens eyes to voice Moving left side  Wound is clean, dry, no signs of infecton Perrl,  Not following commands

## 2019-07-07 NOTE — Progress Notes (Signed)
STROKE TEAM PROGRESS NOTE   INTERVAL HISTORY RN and wife are at bedside.  Patient still intubated, not on sedation, intermittent coughing and agitation. Still has abdominal distention and TF now again stopped. Continue to have anal leakage. Na 154, still spontaneous and purposeful movement on the left, open eyes on voice.   Vitals:   07/07/19 0600 07/07/19 0700 07/07/19 0800 07/07/19 0816  BP: 123/72 125/74 121/72 121/72  Pulse: 72 74 64 65  Resp: (!) 25 (!) 26 (!) 25 (!) 21  Temp:   100 F (37.8 C)   TempSrc:   Axillary   SpO2: 92% 91% 93% 95%  Weight:      Height:        CBC:  Recent Labs  Lab 07/01/19 0759 07/01/19 0804 07/02/19 0759 07/02/19 0806 07/05/19 0413 07/05/19 0413 07/06/19 0422 07/07/19 0313  WBC 6.6   < > 11.0*   < > 10.0  --  10.5  --   NEUTROABS 4.3  --  9.5*  --   --   --   --   --   HGB 15.9   < > 14.9   < > 13.8   < > 15.1 12.6*  HCT 48.3   < > 44.0   < > 43.1   < > 47.2 37.0*  MCV 92.2   < > 84.1   < > 89.8  --  92.5  --   PLT 166   < > 51*   < > 88*  --  93*  --    < > = values in this interval not displayed.    Basic Metabolic Panel:  Recent Labs  Lab 07/03/19 0500 07/03/19 0900 07/03/19 1708 07/03/19 1951 07/05/19 0413 07/05/19 0900 07/06/19 0422 07/06/19 1100 07/07/19 0313 07/07/19 0628  NA 158*   < >  --    < > 158*   < > 151*   < > 153* 154*  K 3.7  --   --    < > 3.5  --  4.2  --  3.3*  --   CL 127*  --   --    < > 125*  --  114*  --   --   --   CO2 24  --   --    < > 26  --  27  --   --   --   GLUCOSE 158*  --   --    < > 127*  --  114*  --   --   --   BUN 15  --   --    < > 22*  --  21*  --   --   --   CREATININE 0.89   < >  --    < > 1.03  --  1.06  --   --   --   CALCIUM 8.6*  --   --    < > 9.2  --  8.8*  --   --   --   MG 2.1  --  2.2  --   --   --   --   --   --   --   PHOS 1.5*  --  2.5  --   --   --   --   --   --   --    < > = values in this interval not displayed.   Lipid Panel:     Component Value Date/Time  CHOL 92 07/02/2019 0431   TRIG 137 07/03/2019 0500   HDL 30 (L) 07/02/2019 0431   CHOLHDL 3.1 07/02/2019 0431   VLDL 17 07/02/2019 0431   LDLCALC 45 07/02/2019 0431   HgbA1c:  Lab Results  Component Value Date   HGBA1C 5.5 07/02/2019   Urine Drug Screen:     Component Value Date/Time   LABOPIA NONE DETECTED 07/01/2019 1930   COCAINSCRNUR NONE DETECTED 07/01/2019 1930   LABBENZ POSITIVE (A) 07/01/2019 1930   AMPHETMU NONE DETECTED 07/01/2019 1930   THCU NONE DETECTED 07/01/2019 1930   LABBARB NONE DETECTED 07/01/2019 1930    Alcohol Level     Component Value Date/Time   ETH <10 07/01/2019 0759    IMAGING past 24 hours DG Abd 1 View  Result Date: 07/07/2019 CLINICAL DATA:  Ileus EXAM: ABDOMEN - 1 VIEW COMPARISON:  Jul 05, 2019. FINDINGS: Feeding tube tip is in the distal stomach region. There are multiple loops of dilated bowel, with slightly more small bowel dilatation compared to recent study. Cecum currently measures 11 cm in diameter, marginally less than on prior study. No free air evident. Mild air is noted in nondilated loops of descending colon. IMPRESSION: Bowel dilatation with increase in small bowel dilatation and slightly less dilatation of the cecum compared to 2 days prior. Suspect ileus as most likely etiology given this overall appearance. No free air evident on supine examination. Enteric tube tip in distal stomach region. Electronically Signed   By: Bretta Bang III M.D.   On: 07/07/2019 08:35   PHYSICAL EXAM  Temp:  [98.7 F (37.1 C)-100.1 F (37.8 C)] 100 F (37.8 C) (05/04 0800) Pulse Rate:  [64-104] 65 (05/04 0816) Resp:  [16-26] 21 (05/04 0816) BP: (108-140)/(65-89) 121/72 (05/04 0816) SpO2:  [89 %-97 %] 95 % (05/04 0816) FiO2 (%):  [60 %-80 %] 60 % (05/04 0816)  General - Well nourished, well developed, intubated off sedation.   Ophthalmologic - fundi not visualized due to noncooperation.  Cardiovascular - Regular rate and rhythm,  intermittent tachycardia.  Abdomen - extended, tympanic sound on percussion  Neuro - intubated off sedation, eyes closed but able to open on repeated voice stimulation, not following commands. With eye opening, eyes in left gaze preference position, not able to cross midline, not blinking to visual threat on the right but able to blink on the left, doll's eyes present, PERRL. Corneal reflex weak on the right and brisk on the left, gag and cough present. Breathing over the vent.  Facial symmetry not able to test due to ET tube.  Tongue protrusion not cooperative. Spontaneous and purposeful movement left UE 4/5 and LE 3-/5. However, right UE and LE plegic. DTR 1+ and no babinski. Sensation, coordination and gait not tested.   ASSESSMENT/PLAN [name] with history of COPD, ongoing tobacco use, and psoriasis, presenting with dense LMCA syndrome. Attempt for Left ICA and Left M1 thrombectomy was done emergently with TICI 2b flow. Unfortunately, the pt had post procedure hemorrhage and reocclusion of the vessels. F/u imaging suggested early edema and pt underwent prophylactic decompressive hemicraniectomy.  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  Stroke etiology unknown  Code Stroke CT head showed early  hypodensity in nearly entire LMCA. ASPECTS 6-7 per notes.  CTA head & neck occlusion cervical ICA on the left. This is most likely due to dissection. The patient has minimal atherosclerotic disease.  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  There is poor collateral circulation on the left.   CTP CBF large core volume of 266 mL, perfusion volume 313 mL, mismatch  volume 47 mL.  ASPECTS using CTP by Dr. Chestine Spore 6 or 7.   IR - TICI2c revascularization  MRI  Restricted diffusion throughout the majority of the left MCA and ACA vascular territories consistent with acute ischemia.  MRA - reocclusion of the intracranial left internal carotid artery. No flow related signal is seen within the left middle cerebral artery.  CT Head 07/04/19 - Slightly increased rightward midline shift with a small amount of herniation beneath the anterior falx. Otherwise unchanged examination.  2D Echo - EF 55 - 60%. No cardiac source of emboli identified.   LDL 45  HgbA1c 5.5  subq heparin for VTE prophylaxis  No antithrombotic prior to admission, now on none d/t hemorrhagic conversion  Therapy recommendations:  pending  Disposition:  pending  Respiratory failure  Intubated on vent  CCM on board  CXR 07/03/19- New consolidation in the right lower lobe and left infrahilar region concerning for pneumonia. Possible small layering right effusion.  CXR 5/3 mild pulm edema w/ atx vs infiltrate/aspiration  On cefepime 4/30>>5/2 for pneumonia  Augmentin 5/2>>   COPD exacerbation   on prednisone  Cerebral edema  MRI  Restricted diffusion throughout the majority of the left MCA and ACA vascular territories consistent with acute ischemia.  CT Head 07/04/19 - Slightly increased rightward midline shift with a small amount of herniation beneath the anterior falx. Otherwise unchanged examination.  S/p [facility] 4/28  On keppra  Na 158->159->160->158->156->151->154  Goal Na 155-160  On NS @ 75  Na Q6h  Fever  HCAP  Tmax 100.4->100.8->100.5->101.9->100  Leukocytosis - 6.6->11.0->11.5->11.1->10.0->10.5  CXR 5/3 - Suspected mild pulmonary edema with small layering effusions and associated bibasilar opacities, atelectasis versus infiltrate/aspiration.  UA neg  On cefepime 4/30>>5/2  Augmentin 5/2>>   Blood culture NGTD   Hypertension  Home meds:   None  Current meds: Normodyne prn ; Levophed prn  stable  SBP goal < 160 due to hemorrhagic conversion . Long-term BP goal normotensive  Dysphagia   NPO  On TF @ 25 -> 50->stopped d/t ileus->trickle feeds->again stopped  Changed IV to D5NS  Dietitian on board  Ileus  abd distension w/ decreased BS  KUB 5/2 distended colonic loop  TF on hold -> trickle feeds-> again stopped  miralax   flexiseal  K 4.2->3.3->repeat pending - keep K > 4.0  Tobacco abuse  Current smoker  Smoking cessation counseling will be provided if able  Other Stroke Risk Factors  Obesity, Body mass index is 33.49 kg/m., recommend weight loss, diet and exercise as appropriate   Other Active Problems  Code Status - Full code  Hypokalemia - 3.3 -> supplement and rechecck->3.5->4.2->3.3  Bradycardia - mild  Thrombocytopenia - 166->51->61->68->88->93 - monitor - improving  Hospital day # 6  This patient is critically ill and at significant risk of neurological worsening, death and care requires constant monitoring of vital signs, hemodynamics,respiratory and cardiac monitoring, extensive review of multiple databases, frequent neurological assessment, discussion with family, other specialists and medical decision making of high complexity. I spent  35 minutes of neurocritical care time  in the care of  this patient. I had long discussion with pt wife at bedside, updated pt current condition, treatment plan and potential prognosis, and answered all the questions. She expressed understanding and appreciation.    Rosalin Hawking, MD PhD Stroke Neurology 07/07/2019 11:10 AM  To contact Stroke Continuity provider, please refer to [email or web address] After hours, contact General Neurology

## 2019-07-07 NOTE — Evaluation (Signed)
Physical Therapy Evaluation Patient Details Name: Lee Hooper MRN: [id number] DOB: [birth year] Today's Date: 07/07/2019   History of Present Illness  This 49 y.o. male admitted 4/28 with Rt sided hemiplegia and aphasia.  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  Repeat CT following procedure showed moderate basal ganglia hemorrhage vs. stroke   Repeat MRI revealed that the ICA and MCA re-occluded with change in FLAIR sequence excluding him from repeat thrombectomy.  He underwent a decompressive hemicraniectomy.  He waws intubated 4/28 and remains intubated.  He developed an ileus 5/2.  PMH includes: COPD, tobacco abuse, psoriasis    Clinical Impression  Pt seen in conjunction with OT.  He was moved into chair/egress position in the bed with HR increasing into the 110s initially, but improved to 00s after he was suctioned.  He does not follow any commands.  He appears to have Rt neglect, and dense Rt hemiplegia.  He currently requires total A for all aspects of ADLs and mobility.  Per RN, pt lives with wife and was fully independent PTA.  Anticipate he will require extensive rehab and a significant amount of assist at discharge.  Anticipate he will requires SNF vs LTACH at discharge.  Acute PT to cont to follow.     Follow Up Recommendations SNF(vs LTACH pending ability to wean off vent)    Equipment Recommendations       Recommendations for Other Services       Precautions / Restrictions Precautions Precautions: Fall;Other (comment) Precaution Comments: Rt hemiplegia, likely aphasia  Restrictions Weight Bearing Restrictions: No      Mobility  Bed Mobility               General bed mobility comments: Pt moved into egress position in the bed.  He requires total A to shift weight and reposition   Transfers                 General transfer comment: unable to attempt   Ambulation/Gait                Stairs            Wheelchair  Mobility    Modified Rankin (Stroke Patients Only)       Balance Overall balance assessment: Needs assistance Sitting-balance support: Single extremity supported Sitting balance-Leahy Scale: Poor Sitting balance - Comments: with bed in egress chair position, pt maintains supported sitting with supervision, but does lean to Rt requiring total assist to reposition                                      Pertinent Vitals/Pain Pain Assessment: Faces Faces Pain Scale: No hurt    Home Living Family/patient expects to be discharged to:: Unsure                 Additional Comments: Pt unable to provide info and no family available     Prior Function Level of Independence: Independent         Comments: Per RN, pt was independent, drives, and works as a Psychologist, prison and probation services: (Unsure )    Extremity/Trunk Assessment   Upper Extremity Assessment Upper Extremity Assessment: Defer to OT evaluation RUE Deficits / Details: No spontaneous movement noted Rt UE.  PROM WFL elbow distally, shoulder WFL to ~90*  RUE Sensation:  decreased light touch;decreased proprioception RUE Coordination: decreased fine motor;decreased gross motor LUE Deficits / Details: pt spontaneously moving Lt UE     Lower Extremity Assessment Lower Extremity Assessment: RLE deficits/detail;LLE deficits/detail RLE Deficits / Details: pt with no withdrawl to pain or active movement. full passive ROM LLE Deficits / Details: withdrawl to pain, active spontaneous movement but not to commands    Cervical / Trunk Assessment Cervical / Trunk Assessment: Other exceptions Cervical / Trunk Exceptions: Rt sided weakness   Communication   Communication: Other (comment);Receptive difficulties(ETT tube, probable aphasia )  Cognition Arousal/Alertness: Awake/alert;Lethargic(opened eyes briefly to name) Behavior During Therapy: Flat affect Overall Cognitive Status: Difficult to  assess                                 General Comments: Pt does not follow commands.  He opens eyes intermittently, and will look to therapist on his Lt, but not on Rt.  If hand/fingers placed in position to hold up thumb or two fingers, he will repeat this frequently - possible perseveration?       General Comments General comments (skin integrity, edema, etc.): Pt with ETT, once in egress position pt with significant productive coughing spell requiring RT to suction him. HR increased from 79 to 113bpm but settled into 90s after suctioning    Exercises Other Exercises Other Exercises: PROM to R LE   Assessment/Plan    PT Assessment Patient needs continued PT services  PT Problem List Decreased strength;Decreased range of motion;Decreased activity tolerance;Decreased balance;Decreased mobility;Decreased coordination;Decreased cognition;Decreased knowledge of use of DME;Decreased safety awareness;Decreased knowledge of precautions       PT Treatment Interventions DME instruction;Gait training;Stair training;Functional mobility training;Therapeutic activities;Therapeutic exercise;Balance training;Neuromuscular re-education;Cognitive remediation;Patient/family education    PT Goals (Current goals can be found in the Care Plan section)  Acute Rehab PT Goals PT Goal Formulation: Patient unable to participate in goal setting Time For Goal Achievement: 07/21/19 Potential to Achieve Goals: Fair    Frequency Min 2X/week(until extubated)   Barriers to discharge        Co-evaluation PT/OT/SLP Co-Evaluation/Treatment: Yes Reason for Co-Treatment: Complexity of the patient's impairments (multi-system involvement) PT goals addressed during session: Mobility/safety with mobility OT goals addressed during session: Strengthening/ROM       AM-PAC PT "6 Clicks" Mobility  Outcome Measure Help needed turning from your back to your side while in a flat bed without using  bedrails?: Total Help needed moving from lying on your back to sitting on the side of a flat bed without using bedrails?: Total Help needed moving to and from a bed to a chair (including a wheelchair)?: Total Help needed standing up from a chair using your arms (e.g., wheelchair or bedside chair)?: Total Help needed to walk in hospital room?: Total Help needed climbing 3-5 steps with a railing? : Total 6 Click Score: 6    End of Session Equipment Utilized During Treatment: Gait belt Activity Tolerance: Patient tolerated treatment well Patient left: in chair;with call bell/phone within reach;with bed alarm set(in chair position) Nurse Communication: Mobility status PT Visit Diagnosis: Difficulty in walking, not elsewhere classified (R26.2);Hemiplegia and hemiparesis Hemiplegia - Right/Left: Right Hemiplegia - dominant/non-dominant: Dominant Hemiplegia - caused by: Nontraumatic intracerebral hemorrhage    Time: 3875-6433 PT Time Calculation (min) (ACUTE ONLY): 18 min   Charges:   PT Evaluation $PT Eval Moderate Complexity: 1 Mod          Miqueas Whilden  Frances Ambrosino, PT, DPT Acute Rehabilitation Services Pager #: [phone] Office #: [phone]   Iona Hansen 07/07/2019, 2:09 PM

## 2019-07-07 NOTE — Progress Notes (Signed)
NAME:  Lee Hooper, MRN:  [id number], DOB:  09/04/1970, LOS: 6 ADMISSION DATE:  07/01/2019, CONSULTATION DATE:  07/01/19 REFERRING MD:  Aroor MD, CHIEF COMPLAINT:  CVA    Brief History   Lee Hooper is a 49 yo M who presented as a code stroke after being found aphasic and nonverbal by his wife. CT/CTA studies showed left ICA/MCA thrombosis and pt was taken for IR thrombectomy for left ICA and left M1 occlusions. Repeat head CT was obtained following the procedure out of concern for possible hemorrhage and showed moderate basal ganglia hemorrhage vs stroke. Repeat MRI revealed that the ICA and MCA re-occluded with change in FLAIR sequence excluding him from repeat thrombectomy. He underwent a L hemicraniectomy. He was intubated for airway protection. PCCM consulted for vent management.  Past Medical History  Psoriasis  COPD  Tobacco Use  Significant Hospital Events   4/28: Admit; L ICA/MCA Thrombectomy with re-occlusion --> left hemicraniectomy  Consults:  IR PCCM  Neuro (Primary) Neurosurgery  Procedures:  4/28 IR - L ICA/MCA Thrombectomy 4/28 L hemicraniectomy  Significant Diagnostic Tests:  4/28 Admit CT Head Code Stroke:  Hyperdense left ICA and MCA compatible with acute thrombus. No acute infarct or hemorrhage.  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 The patient has minimal atherosclerotic disease. The left internal carotid artery is occluded through the terminus and extending in the left M1 M2 and M3 branches. There is poor collateral circulation on the left. There is a large territory infarct involving the left hemisphere. Infarct volume 266 mL.  4/28 Admit MR Brain:  Restricted diffusion throughout much of the left MCA vascular territory consistent with acute ischemia. Restricted diffusion consistent with acute ischemia is also present within the paramedian left  frontoparietal lobes, although this is less well assessed due to the degree of motion degradation at the level of the vertex. There is little if any corresponding T2/FLAIR hyperintensity at these sites. No significant mass effect. No midline shift.  4/28 Post Thrombectomy CT Head: : 1. 3.0 x 2.6 x 3.9 cm region of hyperdensity centered within the left basal ganglia, left subinsular region and inferomedial left temporal lobe likely reflecting a combination of parenchymal hematoma and contrast staining.  2. Edema with loss of gray-white differentiation within the left basal ganglia, left insula and anterior left temporal lobe, likely acute infarction. Subtle changes of acute infarction are also suspected within the paramedian left frontal lobe ACA vascular territory. 3. Scattered small-volume subarachnoid hemorrhage along the left cerebral hemisphere. 4. Regional mass effect with effacement of the left lateral ventricle temporal horn. No midline shift.  4/28 Post Thrombectomy MR Brain:  1. Restricted diffusion throughout the majority of the left MCA and ACA vascular territories consistent with acute ischemia. 2. No significant mass effect at this time. No midline shift. 3. The acute parenchymal hemorrhage and/or contrast staining centered within left basal ganglia and inferomedial left temporal lobe on prior head CT does not appear significantly changed. Based on the MR appearance, it is suspected that a significant component of this previously demonstrated hyperdensity reflects contrast staining. 4. Redemonstrated small volume subarachnoid hemorrhage overlying the left cerebral hemisphere. Small volume subarachnoid hemorrhage is also questioned along the right cerebral hemisphere posteriorly.  4/29 Post Hemicraniectomy CT head:  Complete left ACA/MCA territory infarction with swollen brain bulging through craniectomy defect. No midline shift or entrapment. Petechial hemorrhage at left basal  ganglia. Extraaxial hemorrhage along surface of the infarct.   4/30 CXR: increasing opacification of R lower lung field. (personal interpretation)  5/1 Head CT > Slightly increased rightward midline shift with a small amount of herniation beneath the anterior falx. Otherwise unchanged Examination.  5/2 CXR: Air-filled distended colonic loop in the right lower quadrant measuring 12.5 cm likely the cecum and likely due to ileus. Recommend serial follow-up abdominal films.  5/4 CXR:  Bowel dilatation with increase in small bowel dilatation and slightly less dilatation of the cecum compared to 2 days prior. Suspect ileus as most likely etiology given this overall appearance.  Micro Data:  4/28: SARS-CoV-2/Influenza A/Influenza B PCR: Negative  4/30: Respiratory culture: Abundant Moraxella Catarrhalis, Haemophilus Influenzae, beta lactamase positive  5/2: Urine culture: NGTD  5/2: BC x2: NGTD  Antimicrobials:  4/30 - 5/2: Cefepime 5/2 - present: Augmentin   Interim history/subjective:  5/3: NAE. Was taken off precedex yesterday and pt opened his eyes. Prednisone 50 mg qd was begun. He had a persistent fever around 38.2, HR 110s, BP 140s/60s. Pt required increased O2 with secretions; CXR showed mild edema with small effusions c/f aspiration vs opacification. Blood culture x2 and urine culture collected, NGTD. ABG pending. Currently satting 92% on PRVC FiO2 80, PEEP 10, Plateau 22. WBC declined to 10.5 from 11.1, Na 151 from 156. Tube feeds were held d/t distended colonic loop in RLQ on KUB, to be continued today. Thrombocytopenia improved to 93 from 81.   5/4: Patient remains intubated with spontaneous movement of left arm and leg. Abdomen continues to be distended with KUB concerning for ileus; however, he continues to experience episodes of diarrhea. Tube feedings are currently on hold. Sodium 154 this am (goal 155-160). Continue IVF. ABG yesterday: pH 7.4, pCO2 42, pO2 106, bicarb 26.5.  Satting 91-95% on PRVC FiO2 60, PEEP 10, plateau 20.  Blood culture x2 and urine culture collected, NGTD.WBC pending today.  Objective   Blood pressure 121/72, pulse 65, temperature 100 F (37.8 C), temperature source Axillary, resp. rate (!) 21, height 6' (1.829 m), weight 112 kg, SpO2 95 %.    Vent Mode: PRVC FiO2 (%):  [60 %-80 %] 60 % Set Rate:  [16 bmp] 16 bmp Vt Set:  [620 mL] 620 mL PEEP:  [10 cmH20] 10 cmH20 Plateau Pressure:  [20 cmH20-25 cmH20] 20 cmH20   Intake/Output Summary (Last 24 hours) at 07/07/2019 1029 Last data filed at 07/07/2019 0900 Gross per 24 hour  Intake 1632.99 ml  Output 1200 ml  Net 432.99 ml   Filed Weights   07/04/19 0500 07/05/19 0359 07/06/19 0500  Weight: 114.3 kg 113 kg 112 kg    Examination: Gen: Ill appearing in NAD, intubated and on ventilation. HEENT: ETT in place, PERRLA, MMM.   Neck: no cervical lymphadenopathy, no JVD Heart: RRR, S1, S2, no M/R/G, no chest wall tenderness Lungs: CTAB, no crackles or wheezes Abdomen: Normoactive bowel sounds, soft, ND, no rebound/guarding Extremities: no clubbing, cyanosis, ++ edema: pulses are +2 in bilateral upper and lower extremities Neuro: Unresponsive. No sedation. (prn) Skin:  No rashes,+ multiple scaling plaques noted on flexor surfaces and abdomen  Resolved Hospital Problem list   none  Assessment & Plan:  49 yo with h/o COPD and tobacco use presented with large L MCA stroke s/p IR thrombectomy and subsequent hemorrhage s/p hemicraniectomy.   Acute hypoxic respiratory failure requiring mechanical ventilation: Pt was intubated on 4/28 for acute respiratory failure. Likely related to his CVA. MRI brain  5/1 revealed slightly increased rightward midline shiftwith a small amount ofherniation beneath the anterior falx. New consolidation on CXR on 4/30 highly c/f HCAP. Vent settings: FiO2 60%, PEEP 10, Plat 22. ABG yesterday pH 7.4, pCO2 42, pO2 106, bicarb 26.5. - Continue Vent support - PNA  coverage as below   -augmentin to complete 7 days total - VAP protocol in place - Wean PEEP and FiO2 for sats greater than 90%. -down on settings to 60%. Hopeful for cont improvement and sbt in next day or two however due to underlying CVA pt is not following commands.  -minimal sedation with only 2-3  Bolus fentanyl/24hr  Pneumonia: New consolidation on 4/30 highly c/f CAP vs HCAP. Cultures grew beta-lactamase positive Moraxella catarrhalis and Haemophilus influenzae. Initially on cefepime now narrowed to Augmentin. Pt still has persistent low grade fever to 100.7 but WBC downtrending (despite prednisone).  - Continue Augmentin for total of 7 days - WBC pending today  Wheezing c/f COPD exacerbation: Pt has had persistent wheezing throughout his hospitalization. Prednisone started on 5/2 with c/f COPD exacerbation. Wheezing improved today.  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 Repeat head CT was obtained following the procedure out of concern for possible hemorrhage and showed moderate basal ganglia hemorrhage vs stroke. Repeat MRI revealed that the ICA and MCA re-occluded with change in FLAIR sequence excluding him from repeat thrombectomy. Neurology primary team and managing.  - Management per neurology  - Maintain neuro protective measures; goal for eurothermia, euglycemia, normoxia - Nutrition and bowel regimen  - Seizure precautions  - AEDs per neurology - Allow for hypernatremia, Na goal 155-160  Ileus: Pt began to have abdominal distension and decreased bowel sounds on 5/2; KUB showed distended colonic loop in RLQ, likely at the cecum. Tube feeds were held. Pt continues to have anal leakage. F/u KUB  revealed bowel dilatation with increase in small bowel dilatation and slightly less dilatation of the cecum compared to 2 days prior. - Flexiseal. On Sennosides, Miralax. Will add Reglan today. Need to continue to dose despite diarrhea with wrosening imaging - tf currently on hold - repeat KUB in am - K+ down to 3.3 from 4.2. Will replete today. Will order BMP, mag, and phos tomorrow am.  FEN: Pt has hypernatremia throughout hospitalization, goal 155-160 per neurology but most recently 151 from 155. Also has had hypokalemia; most recent K 3.3.   - Management per neurology  - Follow Na q6hrs  - Supplement K as needed; will replete today  Best practice:  Diet: Tube Feeds on hold 2/2 ileus Pain/Anxiety/Delirium protocol (if indicated): minimize VAP protocol (if indicated):per protocol DVT prophylaxis: SCDs GI prophylaxis: Protonix Glucose control: SSI Mobility: Bedrest Code Status: FULL Family Communication: per primary team  Disposition: ICU   Labs   CBC: Recent Labs  Lab 07/01/19 0759 07/01/19 0804 07/02/19 0759 07/02/19 0806 07/03/19 0500 07/03/19 0500 07/03/19 0900 07/04/19 0500 07/05/19 0413 07/06/19 0422 07/07/19 0313  WBC 6.6   < > 11.0*   < > 11.0*  --  11.5* 11.1* 10.0 10.5  --   NEUTROABS 4.3  --  9.5*  --   --   --   --   --   --   --   --  HGB 15.9   < > 14.9   < > 13.9   < > 13.9 13.7 13.8 15.1 12.6*  HCT 48.3   < > 44.0   < > 42.2   < > 42.5 42.6 43.1 47.2 37.0*  MCV 92.2   < > 84.1   < > 87.4  --  88.9 88.2 89.8 92.5  --   PLT 166   < > 51*   < > 61*  --  61* 68* 88* 93*  --    < > = values in this interval not displayed.    Basic Metabolic Panel: Recent Labs  Lab 07/02/19 0759 07/02/19 0806 07/02/19 1832 07/02/19 1951 07/03/19 0500 07/03/19 0500 07/03/19 0900 07/03/19 1351 07/03/19 1708 07/03/19 1951 07/04/19 0500 07/04/19 0835 07/05/19 0413 07/05/19 0900 07/06/19 0422 07/06/19 0422 07/06/19 1100 07/06/19 1600 07/06/19 2212  07/07/19 0313 07/07/19 0628  NA 149*   < >  --    < > 158*   < > 158*   < >  --    < > 158*   < > 158*   < > 151*   < > 154* 153* 155* 153* 154*  K 4.2   < >  --    < > 3.7  --   --   --   --   --  3.3*  --  3.5  --  4.2  --   --   --   --  3.3*  --   CL 118*  --   --   --  127*  --   --   --   --   --  125*  --  125*  --  114*  --   --   --   --   --   --   CO2 23  --   --   --  24  --   --   --   --   --  27  --  26  --  27  --   --   --   --   --   --   GLUCOSE 160*  --   --   --  158*  --   --   --   --   --  132*  --  127*  --  114*  --   --   --   --   --   --   BUN 8  --   --   --  15  --   --   --   --   --  19  --  22*  --  21*  --   --   --   --   --   --   CREATININE 0.97   < >  --   --  0.89  --  1.00  --   --   --  1.01  --  1.03  --  1.06  --   --   --   --   --   --   CALCIUM 8.4*  --   --   --  8.6*  --   --   --   --   --  9.1  --  9.2  --  8.8*  --   --   --   --   --   --   MG 1.8  --  1.7  --  2.1  --   --   --  2.2  --   --   --   --   --   --   --   --   --   --   --   --   PHOS 2.3*  --  1.6*  --  1.5*  --   --   --  2.5  --   --   --   --   --   --   --   --   --   --   --   --    < > = values in this interval not displayed.   GFR: Estimated Creatinine Clearance: 107.8 mL/min (by C-G formula based on SCr of 1.06 mg/dL). Recent Labs  Lab 07/03/19 0900 07/04/19 0500 07/05/19 0413 07/06/19 0422  WBC 11.5* 11.1* 10.0 10.5    Liver Function Tests: Recent Labs  Lab 07/02/19 0759 07/03/19 0500 07/04/19 0500 07/05/19 0413 07/06/19 0422  AST 28 30 27 28  37  ALT 34 35 29 30 32  ALKPHOS 34* 34* 36* 40 40  BILITOT 0.8 0.6 0.8 0.7 1.2  PROT 5.1* 4.9* 5.4* 5.6* 5.5*  ALBUMIN 3.0* 2.7* 2.6* 2.5* 2.3*   No results for input(s): LIPASE, AMYLASE in the last 168 hours. No results for input(s): AMMONIA in the last 168 hours.  ABG    Component Value Date/Time   PHART 7.410 07/07/2019 0313   PCO2ART 42.0 07/07/2019 0313   PO2ART 106 07/07/2019 0313   HCO3 26.5  07/07/2019 0313   TCO2 28 07/07/2019 0313   ACIDBASEDEF 3.0 (H) 07/02/2019 0806   O2SAT 98.0 07/07/2019 0313     Coagulation Profile: Recent Labs  Lab 07/01/19 0759 07/01/19 1821  INR 1.0 1.4*    Cardiac Enzymes: No results for input(s): CKTOTAL, CKMB, CKMBINDEX, TROPONINI in the last 168 hours.  HbA1C: Hgb A1c MFr Bld  Date/Time Value Ref Range Status  07/02/2019 04:31 AM 5.5 4.8 - 5.6 % Final    Comment:    (NOTE) Pre diabetes:          5.7%-6.4% Diabetes:              >6.4% Glycemic control for   <7.0% adults with diabetes     CBG: Recent Labs  Lab 07/06/19 1535 07/06/19 1935 07/06/19 2325 07/07/19 0318 07/07/19 0828  GLUCAP 123* 108* 99 110* 94   Critical care time: The patient is critically ill with multiple organ systems failure and requires high complexity decision making for assessment and support, frequent evaluation and titration of therapies, application of advanced monitoring technologies and extensive interpretation of multiple databases.  Critical care time 38 mins. This represents my time independent of the NP's/medical students/residents time taking care of the pt. This is excluding procedures.    09/06/19 DO  Pulmonary and Critical Care 07/07/2019, 11:45 AM

## 2019-07-08 ENCOUNTER — Inpatient Hospital Stay (HOSPITAL_COMMUNITY): Payer: Medicaid Other

## 2019-07-08 DIAGNOSIS — R509 Fever, unspecified: Secondary | ICD-10-CM

## 2019-07-08 LAB — GLUCOSE, CAPILLARY
Glucose-Capillary: 102 mg/dL — ABNORMAL HIGH (ref 70–99)
Glucose-Capillary: 104 mg/dL — ABNORMAL HIGH (ref 70–99)
Glucose-Capillary: 121 mg/dL — ABNORMAL HIGH (ref 70–99)
Glucose-Capillary: 83 mg/dL (ref 70–99)
Glucose-Capillary: 87 mg/dL (ref 70–99)
Glucose-Capillary: 98 mg/dL (ref 70–99)

## 2019-07-08 LAB — CBC
HCT: 42.3 % (ref 39.0–52.0)
Hemoglobin: 13.4 g/dL (ref 13.0–17.0)
MCH: 29.7 pg (ref 26.0–34.0)
MCHC: 31.7 g/dL (ref 30.0–36.0)
MCV: 93.8 fL (ref 80.0–100.0)
Platelets: 92 10*3/uL — ABNORMAL LOW (ref 150–400)
RBC: 4.51 MIL/uL (ref 4.22–5.81)
RDW: 14.2 % (ref 11.5–15.5)
WBC: 12.4 10*3/uL — ABNORMAL HIGH (ref 4.0–10.5)
nRBC: 0 % (ref 0.0–0.2)

## 2019-07-08 LAB — BASIC METABOLIC PANEL
Anion gap: 5 (ref 5–15)
BUN: 17 mg/dL (ref 6–20)
CO2: 26 mmol/L (ref 22–32)
Calcium: 8.8 mg/dL — ABNORMAL LOW (ref 8.9–10.3)
Chloride: 119 mmol/L — ABNORMAL HIGH (ref 98–111)
Creatinine, Ser: 0.96 mg/dL (ref 0.61–1.24)
GFR calc Af Amer: >60 mL/min (ref >60)
GFR calc non Af Amer: >60 mL/min (ref >60)
Glucose, Bld: 103 mg/dL — ABNORMAL HIGH (ref 70–99)
Potassium: 3.9 mmol/L (ref 3.5–5.1)
Sodium: 150 mmol/L — ABNORMAL HIGH (ref 135–145)

## 2019-07-08 LAB — PHOSPHORUS: Phosphorus: 2.6 mg/dL (ref 2.5–4.6)

## 2019-07-08 LAB — MAGNESIUM: Magnesium: 2.5 mg/dL — ABNORMAL HIGH (ref 1.7–2.4)

## 2019-07-08 MED ORDER — ACETAMINOPHEN 160 MG/5ML PO SOLN
650.0000 mg | ORAL | Status: DC | PRN
  Administered 2019-07-08 – 2019-07-15 (×16): 650 mg
  Administered 2019-07-27: 500 mg
  Administered 2019-07-27 – 2019-08-22 (×21): 650 mg
  Filled 2019-07-08 (×40): qty 20.3

## 2019-07-08 MED ORDER — FUROSEMIDE 10 MG/ML IJ SOLN
40.0000 mg | Freq: Once | INTRAMUSCULAR | Status: AC
  Administered 2019-07-08: 13:00:00 40 mg via INTRAVENOUS
  Filled 2019-07-08: qty 4

## 2019-07-08 MED ORDER — LACTULOSE 10 GM/15ML PO SOLN
30.0000 g | Freq: Once | ORAL | Status: AC
  Administered 2019-07-08: 12:00:00 30 g
  Filled 2019-07-08: qty 45

## 2019-07-08 MED ORDER — ONDANSETRON HCL 4 MG/2ML IJ SOLN: 4.0000 mg | INTRAMUSCULAR | Status: DC | PRN

## 2019-07-08 MED ORDER — SODIUM CHLORIDE 0.9 % IV SOLN
3.0000 g | Freq: Four times a day (QID) | INTRAVENOUS | Status: AC
  Administered 2019-07-08 – 2019-07-09 (×7): 3 g via INTRAVENOUS
  Filled 2019-07-08 (×7): qty 3

## 2019-07-08 MED ORDER — METOCLOPRAMIDE HCL 5 MG/ML IJ SOLN
10.0000 mg | Freq: Four times a day (QID) | INTRAMUSCULAR | Status: AC
  Administered 2019-07-08 – 2019-07-09 (×6): 10 mg via INTRAVENOUS
  Filled 2019-07-08 (×6): qty 2

## 2019-07-08 MED ORDER — ONDANSETRON HCL 4 MG PO TABS: 4.0000 mg | ORAL_TABLET | ORAL | Status: DC | PRN

## 2019-07-08 MED ORDER — ACETAMINOPHEN 650 MG RE SUPP: 650.0000 mg | RECTAL | Status: DC | PRN

## 2019-07-08 MED ORDER — ACETAMINOPHEN 325 MG PO TABS
650.0000 mg | ORAL_TABLET | ORAL | Status: DC | PRN
  Administered 2019-07-23 – 2019-08-22 (×3): 650 mg
  Filled 2019-07-08 (×3): qty 2

## 2019-07-08 NOTE — Progress Notes (Signed)
49 year old man with devastating left MCA/ICA thrombosis status post thrombectomy 4/28 Status post decompressive craniectomy 4/29  Remains intubated, no sedation, wife at bedside, intermittently opens eyes but does not follow commands, spontaneously moves left arm and foot but not to command, bilateral ventilated breath sounds, S1-S2 regular, puffy hands and feet.  Chest x-ray personally reviewed which shows small layering effusions and bibasilar opacities X-ray abdomen shows ileus. Labs show slight decrease in sodium, normal electrolytes, mild leukocytosis  Impression/plan Ileus-increase Reglan to 10 every 6 for 6 doses.  Add 1 dose of lactulose and use Dulcolax if needed, minimize fentanyl use  Acute respiratory failure -we will diurese with 40 of Lasix and attempt to drop PEEP to 5 Not ready for spontaneous breathing trials yet. Unfortunately will need tracheostomy if we are to move forward Complete course of prednisone for COPD exacerbation, DuoNebs as needed  Catastrophic left CVA -defer hypertonic saline and embolic work-up to neurology,   Wife updated at bedside  The patient is critically ill with multiple organ systems failure and requires high complexity decision making for assessment and support, frequent evaluation and titration of therapies, application of advanced monitoring technologies and extensive interpretation of multiple databases. Critical Care Time devoted to patient care services described in this note independent of APP/resident  time is 31 minutes.   Comer Locket Vassie Loll MD

## 2019-07-08 NOTE — Progress Notes (Signed)
STROKE TEAM PROGRESS NOTE   INTERVAL HISTORY Pt RN at bedside. Pt lying in bed, still intubated on vent. Less responsive than yesterday, but still open eyes on voice and pain, spontaneous movement on the left. However, still has illus and CCM is working on it. Pt likely need trach and PEG.    Vitals:   07/08/19 0700 07/08/19 0800 07/08/19 0900 07/08/19 1000  BP: 110/69 124/72 122/67 113/69  Pulse: 70 76 81 71  Resp: (!) 22 (!) 23 (!) 25 (!) 25  Temp:  (!) 100.5 F (38.1 C)    TempSrc:  Axillary    SpO2: 97% 97% (!) 89% 93%  Weight:      Height:       CBC:  Recent Labs  Lab 07/02/19 0759 07/02/19 0806 07/06/19 0422 07/06/19 0422 07/07/19 0313 07/08/19 0711  WBC 11.0*   < > 10.5  --   --  12.4*  NEUTROABS 9.5*  --   --   --   --   --   HGB 14.9   < > 15.1   < > 12.6* 13.4  HCT 44.0   < > 47.2   < > 37.0* 42.3  MCV 84.1   < > 92.5  --   --  93.8  PLT 51*   < > 93*  --   --  92*   < > = values in this interval not displayed.   Basic Metabolic Panel:  Recent Labs  Lab 07/03/19 1708 07/03/19 1951 07/07/19 1105 07/07/19 1650 07/07/19 2146 07/08/19 0711  NA  --    < > 152*   < > 149* 150*  K  --    < > 4.7  --   --  3.9  CL  --    < > 116*  --   --  119*  CO2  --    < > 25  --   --  26  GLUCOSE  --    < > 140*  --   --  103*  BUN  --    < > 21*  --   --  17  CREATININE  --    < > 1.01  --   --  0.96  CALCIUM  --    < > 8.8*  --   --  8.8*  MG 2.2   < > 2.5*  --   --  2.5*  PHOS 2.5  --   --   --   --  2.6   < > = values in this interval not displayed.   Lipid Panel:     Component Value Date/Time   CHOL 92 07/02/2019 0431   TRIG 137 07/03/2019 0500   HDL 30 (L) 07/02/2019 0431   CHOLHDL 3.1 07/02/2019 0431   VLDL 17 07/02/2019 0431   LDLCALC 45 07/02/2019 0431   HgbA1c:  Lab Results  Component Value Date   HGBA1C 5.5 07/02/2019   Urine Drug Screen:     Component Value Date/Time   LABOPIA NONE DETECTED 07/01/2019 1930   COCAINSCRNUR NONE DETECTED  07/01/2019 1930   LABBENZ POSITIVE (A) 07/01/2019 1930   AMPHETMU NONE DETECTED 07/01/2019 1930   THCU NONE DETECTED 07/01/2019 1930   LABBARB NONE DETECTED 07/01/2019 1930    Alcohol Level     Component Value Date/Time   ETH <10 07/01/2019 0759    IMAGING past 24 hours DG Abd 1 View  Result Date: 07/08/2019 CLINICAL DATA:  Ileus  EXAM: ABDOMEN - 1 VIEW COMPARISON:  Radiograph 07/07/2019 FINDINGS: Stable stacked loops of small bowel within the left upper quadrant with a larger air distended loop of colon in the right lower quadrant. Milder distal colonic distention as well. Tip of a transesophageal tube terminates in the right upper quadrant, possibly within the distal stomach or duodenal bulb. No convincing evidence pneumatosis or free intraperitoneal air. No suspicious calcifications. Osseous structures are unchanged from prior. IMPRESSION: Stable appearance of an adynamic ileus. No convincing evidence of free air, pneumatosis or significant interval change. Electronically Signed   By: Kreg Shropshire M.D.   On: 07/08/2019 06:31   PHYSICAL EXAM   Temp:  [98.6 F (37 C)-100.5 F (38.1 C)] 100.5 F (38.1 C) (05/05 0800) Pulse Rate:  [45-89] 71 (05/05 1000) Resp:  [21-26] 25 (05/05 1000) BP: (110-136)/(65-83) 113/69 (05/05 1000) SpO2:  [89 %-100 %] 93 % (05/05 1000) FiO2 (%):  [50 %-70 %] 50 % (05/05 0839) Weight:  [510 kg] 112 kg (05/05 0427)  General - Well nourished, well developed, intubated off sedation.   Ophthalmologic - fundi not visualized due to noncooperation.  Cardiovascular - Regular rate and rhythm.  Abdomen - extended, tympanic sound on percussion  Neuro - intubated off sedation, eyes closed but able to open on repeated voice stimulation, not following commands. With eye opening, eyes in middle position, not blinking to visual threat bilaterally, doll's eyes present, PERRL. Corneal reflex weak on the right and brisk on the left, gag and cough present. Breathing over  the vent.  Facial symmetry not able to test due to ET tube.  Tongue protrusion not cooperative. Spontaneous and purposeful movement left UE 3/5 and LE 2/5. However, right UE and LE plegic. DTR 1+ and no babinski. Sensation, coordination and gait not tested.   ASSESSMENT/PLAN Mr. JAYSE HODKINSON is a 49 y.o. male with history of COPD, ongoing tobacco use, and psoriasis, presenting with dense LMCA syndrome. Attempt for Left ICA and Left M1 thrombectomy was done emergently with TICI 2b flow. Unfortunately, the pt had post procedure hemorrhage and reocclusion of the vessels. F/u imaging suggested early edema and pt underwent prophylactic decompressive hemicraniectomy.  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  Stroke etiology unknown  Code Stroke CT head showed early hypodensity in nearly entire LMCA. ASPECTS 6-7 per notes.  CTA head & neck occlusion cervical ICA on the left. This is most likely due to dissection. The patient has minimal atherosclerotic disease.  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  There is poor collateral circulation on the left.   CTP CBF large core volume of 266 mL, perfusion volume 313 mL, mismatch volume 47 mL.  ASPECTS using CTP by Dr. Chestine Spore 6 or 7.   IR - TICI2c revascularization  MRI  Restricted diffusion throughout the majority of the left MCA and ACA vascular territories consistent with acute ischemia.  MRA - reocclusion of the intracranial left internal carotid artery. No flow related signal is seen within the left middle cerebral artery.  CT Head 07/04/19 - Slightly increased rightward midline shift with a small amount of herniation beneath the anterior  falx. Otherwise unchanged examination.  CT  repeat in am  2D Echo - EF 55 - 60%. No cardiac source of emboli identified.   LDL 45  HgbA1c 5.5  subq heparin for VTE prophylaxis  No antithrombotic prior to admission, now on none d/t hemorrhagic conversion  Therapy recommendations:  SNF  Disposition:  pending  Respiratory failure  Intubated on vent  CCM on board  CXR 07/03/19- New consolidation in the right lower lobe and left infrahilar region concerning for pneumonia. Possible small layering right effusion.  CXR 5/3 mild pulm edema w/ atx vs infiltrate/aspiration  On cefepime 4/30>>5/2 for pneumonia  Augmentin 5/2>>5/4  Unasyn 5/5>>  Likely need trach and PEG  COPD exacerbation   on prednisone  CCM on board  Cerebral edema  MRI  Restricted diffusion throughout the majority of the left MCA and ACA vascular territories consistent with acute ischemia.  CT Head 07/04/19 - Slightly increased rightward midline shift with a small amount of herniation beneath the anterior falx. Otherwise unchanged examination.  S/p [name] 4/28  On keppra  Na 158->159->160->158->156->151->151->149->150  Off 3%. On D5NS @ 49  Gradually allow Na normalize  Fever  HCAP  Tmax 100.4->100.8->100.5->101.9->100->100.5  Leukocytosis - 6.6->11.0->11.5->11.1->10.0->10.5->12.4  CXR 5/3 - Suspected mild pulmonary edema with small layering effusions and associated bibasilar opacities, atelectasis versus infiltrate/aspiration.  UA neg  On cefepime 4/30>>5/2  Augmentin 5/2>>5/4, Unasyn 5/5>>  Blood culture NGTD   Hypertension  Home meds:  None  Current meds: Normodyne prn ; Levophed prn  stable  SBP goal < 160 due to hemorrhagic conversion . Long-term BP goal normotensive  Dysphagia   NPO  On TF @ 25 -> 50->stopped d/t ileus->trickle feeds->again stopped  Changed IV to D5NS  Dietitian on board  Ileus  abd distension w/ decreased BS  KUB 5/2 distended colonic  loop  TF on hold -> trickle feeds-> again stopped  miralax   flexiseal  CCM on board  K 4.2->3.3->3.9 - supplement - keep K > 4.0  Tobacco abuse  Current smoker  Smoking cessation counseling will be provided if able  Other Stroke Risk Factors  Obesity, Body mass index is 33.49 kg/m., recommend weight loss, diet and exercise as appropriate   Other Active Problems  Code Status - Full code  K 4.2->3.3->3.9 - supplement - keep K > 4.0  Thrombocytopenia - 166->51->61->68->88->93->92 - monitor - improving  Hospital day # 7  This patient is critically ill and at significant risk of neurological worsening, death and care requires constant monitoring of vital signs, hemodynamics,respiratory and cardiac monitoring, extensive review of multiple databases, frequent neurological assessment, discussion with family, other specialists and medical decision making of high complexity. I spent 35 minutes of neurocritical care time  in the care of  this patient. I discussed with Dr. Elsworth Soho.    Rosalin Hawking, MD PhD Stroke Neurology 07/08/2019 10:49 AM  To contact Stroke Continuity provider, please refer to [email or web address] After hours, contact General Neurology

## 2019-07-08 NOTE — Progress Notes (Signed)
Patient ID: Lee Hooper, male   DOB: Apr 26, 1970, 49 y.o.   MRN: [id number] BP 117/71   Pulse 74   Temp 99.2 F (37.3 C) (Axillary)   Resp (!) 25   Ht 6' (1.829 m)   Wt 112 kg   SpO2 96%   BMI 33.49 kg/m  Opens right eye Purposeful movements on left side Wound is clean, dry, no signs of infection No new recommendations. stable

## 2019-07-08 NOTE — Progress Notes (Signed)
eLink Physician-Brief Progress Note Patient Name: Lee Hooper DOB: Feb 06, 1971 MRN: [id number]   Date of Service  07/08/2019  HPI/Events of Note  Agitation - Request to renew L soft wrist restraint.  eICU Interventions  Will renew L soft wrist restraint X 10 hours.      Intervention Category Major Interventions: Delirium, psychosis, severe agitation - evaluation and management  [name] 07/08/2019, 10:16 PM

## 2019-07-08 NOTE — Progress Notes (Signed)
Nutrition Follow-up  DOCUMENTATION CODES:   Obesity unspecified  INTERVENTION:   TF held for ileus, confirmed by xray Will monitor for need to initiate TPN   NUTRITION DIAGNOSIS:   Inadequate oral intake related to inability to eat as evidenced by NPO status. Ongoing.   GOAL:   Patient will meet greater than or equal to 90% of their needs Not met.   MONITOR:   I & O's  REASON FOR ASSESSMENT:   Consult, Ventilator Enteral/tube feeding initiation and management  ASSESSMENT:   Pt with PMH of smoking and COPD admitted with L ICA/MCA s/p IR for revascularization however pt had re-occlusion now s/p emergent L hemicraniectomy.   Pt discussed during ICU rounds and with RN.  Per RN pt developed abd distention on 5/2. KUB showed distended colonic loop in RLQ likely at cecum. TF held. Reglan added.  Per MD would need trach and PEG. MD discussing plan of care with family.   4/30 cortrak placed; tip gastric 5/2 TF held due to ileus 5/3 TF resume at trickle  5/4 TF held due to worsening abd distention; reglan added 5/5 per xray ileus no better; reglan dose increased  Patient is currently intubated on ventilator support MV: 15.7 L/min Temp (24hrs), Avg:99.1 F (37.3 C), Min:98.6 F (37 C), Max:100.5 F (38.1 C)  Medications reviewed and include: MVI, miralax, senokot  Lactulose x 1  Labs reviewed: Na 150 (H)   Diet Order:   Diet Order            Diet NPO time specified  Diet effective now              EDUCATION NEEDS:   No education needs have been identified at this time  Skin:  Skin Assessment: Reviewed RN Assessment  Last BM:  50 ml via rectal tube  Height:   Ht Readings from Last 1 Encounters:  07/02/19 6' (1.829 m)    Weight:   Wt Readings from Last 1 Encounters:  07/08/19 112 kg    Ideal Body Weight:  80.9 kg  BMI:  Body mass index is 33.49 kg/m.  Estimated Nutritional Needs:   Kcal:  1400-1600  Protein:  >161 grams  Fluid:  >1.5  L/day  Lockie Pares., RD, LDN, CNSC See AMiON for contact information

## 2019-07-08 NOTE — Progress Notes (Signed)
NAME:  LANGSTON TUBERVILLE, MRN:  [id number], DOB:  [birth year], LOS: 7 ADMISSION DATE:  07/01/2019, CONSULTATION DATE:  07/01/19 REFERRING MD:  Aroor MD, CHIEF COMPLAINT:  CVA    Brief History   Lee Hooper is a 49 yo M who presented as a code stroke after being found aphasic and nonverbal by his wife. CT/CTA studies showed left ICA/MCA thrombosis and pt was taken for IR thrombectomy for left ICA and left M1 occlusions. Repeat head CT was obtained following the procedure out of concern for possible hemorrhage and showed moderate basal ganglia hemorrhage vs stroke. Repeat MRI revealed that the ICA and MCA re-occluded with change in FLAIR sequence excluding him from repeat thrombectomy. He underwent a L hemicraniectomy. He was intubated for airway protection. PCCM consulted for vent management.  Past Medical History  Psoriasis  COPD  Tobacco Use  Significant Hospital Events   4/28: Admit; L ICA/MCA Thrombectomy with re-occlusion --> left hemicraniectomy, intubated for acute hypoxic respiratory failure   Consults:  IR PCCM  Neuro (Primary) Neurosurgery  Procedures:  4/28 IR - L ICA/MCA Thrombectomy 4/28 L hemicraniectomy 4/28 Intubation  Significant Diagnostic Tests:  4/28 Admit CT Head Code Stroke:  Hyperdense left ICA and MCA compatible with acute thrombus. No acute infarct or hemorrhage.  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 The patient has minimal atherosclerotic disease. The left internal carotid artery is occluded through the terminus and extending in the left M1 M2 and M3 branches. There is poor collateral circulation on the left. There is a large territory infarct involving the left hemisphere. Infarct volume 266 mL.  4/28 Admit MR Brain:  Restricted diffusion throughout much of the left MCA vascular territory consistent with acute ischemia. Restricted diffusion consistent  with acute ischemia is also present within the paramedian left frontoparietal lobes, although this is less well assessed due to the degree of motion degradation at the level of the vertex. There is little if any corresponding T2/FLAIR hyperintensity at these sites. No significant mass effect. No midline shift.  4/28 Post Thrombectomy CT Head: : 1. 3.0 x 2.6 x 3.9 cm region of hyperdensity centered within the left basal ganglia, left subinsular region and inferomedial left temporal lobe likely reflecting a combination of parenchymal hematoma and contrast staining.  2. Edema with loss of gray-white differentiation within the left basal ganglia, left insula and anterior left temporal lobe, likely acute infarction. Subtle changes of acute infarction are also suspected within the paramedian left frontal lobe ACA vascular territory. 3. Scattered small-volume subarachnoid hemorrhage along the left cerebral hemisphere. 4. Regional mass effect with effacement of the left lateral ventricle temporal horn. No midline shift.  4/28 Post Thrombectomy MR Brain:  1. Restricted diffusion throughout the majority of the left MCA and ACA vascular territories consistent with acute ischemia. 2. No significant mass effect at this time. No midline shift. 3. The acute parenchymal hemorrhage and/or contrast staining centered within left basal ganglia and inferomedial left temporal lobe on prior head CT does not appear significantly changed. Based on the MR appearance, it is suspected that a significant component of this previously demonstrated hyperdensity reflects contrast staining. 4. Redemonstrated small volume subarachnoid hemorrhage overlying the left cerebral hemisphere. Small volume subarachnoid hemorrhage is also questioned along the right cerebral hemisphere posteriorly.  4/29 Post Hemicraniectomy CT head:  Complete left ACA/MCA territory infarction with swollen brain bulging through craniectomy defect. No midline  shift or entrapment. Petechial hemorrhage at left basal ganglia. Extraaxial hemorrhage along surface of the infarct.   4/30 CXR: increasing opacification of R lower lung field. (personal interpretation)  5/1 Head CT > Slightly increased rightward midline shift with a small amount of herniation beneath the anterior falx. Otherwise unchanged Examination.  5/2 abdominal Xray: Air-filled distended colonic loop in the right lower quadrant measuring 12.5 cm likely the cecum and likely due to ileus. Recommend serial follow-up abdominal films.  5/4 abdominal Xray:  Bowel dilatation with increase in small bowel dilatation and slightly less dilatation of the cecum compared to 2 days prior. Suspect ileus as most likely etiology given this overall appearance.  5/5 abdominal Xray:  Stable appearance of an adynamic ileus. No convincing evidence of free air, pneumatosis or significant interval change.  Micro Data:  4/28: SARS-CoV-2/Influenza A/Influenza B PCR: Negative  4/30: Respiratory culture: Abundant Moraxella Catarrhalis, Haemophilus Influenzae, beta lactamase positive  5/2: Urine culture: NGTD  5/2: BC x2: NGTD  Antimicrobials:  4/30 - 5/2: Cefepime 5/2 - present: Augmentin   Interim history/subjective:  5/3: NAE. Was taken off precedex yesterday and pt opened his eyes. Prednisone 50 mg qd was begun. He had a persistent fever around 38.2, HR 110s, BP 140s/60s. Pt required increased O2 with secretions; CXR showed mild edema with small effusions c/f aspiration vs opacification. Blood culture x2 and urine culture collected, NGTD. ABG pending. Currently satting 92% on PRVC FiO2 80, PEEP 10, Plateau 22. WBC declined to 10.5 from 11.1, Na 151 from 156. Tube feeds were held d/t distended colonic loop in RLQ on KUB, to be continued today. Thrombocytopenia improved to 93 from 81.   5/4: Patient remains intubated with spontaneous movement of left arm and leg. Abdomen continues to be distended with KUB  concerning for ileus; however, he continues to experience episodes of diarrhea. Tube feedings are currently on hold. Sodium 154 this am (goal 155-160). Continue IVF. ABG yesterday: pH 7.4, pCO2 42, pO2 106, bicarb 26.5. Satting 91-95% on PRVC FiO2 60, PEEP 10, plateau 20.  Blood culture x2 and urine culture collected, NGTD.WBC pending today.  5/5: NAE. Pt remains intubated on PRN fetanyl. Will open eyes and spontaneously move left leg and arm. Abdomen remains distended with decreased bowel sounds. Continues to have liquid stool in rectal pouch. Tube feedings remain on hold. Sodium 150 this am. IVF @ 75 mL/hr. Satting 90-100% on PRVC 620 mL, FiO2 50% with PEEP 10, plateau 20. Blood culture NGTD.  Objective   Blood pressure 122/67, pulse 81, temperature (!) 100.5 F (38.1 C), temperature source Axillary, resp. rate (!) 25, height 6' (1.829 m), weight 112 kg, SpO2 (!) 89 %.    Vent Mode: PRVC FiO2 (%):  [50 %-70 %] 50 % Set Rate:  [16 bmp] 16 bmp Vt Set:  [620 mL] 620 mL PEEP:  [10 cmH20] 10 cmH20 Plateau Pressure:  [20 cmH20-24 cmH20] 24 cmH20   Intake/Output Summary (Last 24 hours) at 07/08/2019 0940 Last data filed at 07/08/2019 0800 Gross per 24 hour  Intake 2674.95 ml  Output 2660 ml  Net 14.95 ml   Filed Weights   07/05/19 0359 07/06/19 0500 07/08/19 0427  Weight: 113 kg 112 kg 112 kg    Examination: Gen: Ill appearing in NAD, intubated and on ventilation. HEENT: ETT in place, MMM.   Neck: no cervical lymphadenopathy, no JVD Heart: RRR, S1, S2, no M/R/G, no chest wall tenderness Lungs: anterior lung sounds CTAB, no crackles or wheezes Abdomen: Decreased  bowel sounds, distended, no rebound/guarding Extremities: no clubbing, cyanosis, ++ edema: pulses are +2 in bilateral upper and lower extremities Neuro: Will open eyes and spontaneously move left leg and arm. PRN sedation Skin:  No rashes,+ multiple scaling plaques noted on flexor surfaces and abdomen  Resolved Hospital Problem  list   none  Assessment & Plan:  49 yo with h/o COPD and tobacco use presented with large L MCA stroke s/p IR thrombectomy and subsequent hemorrhage s/p hemicraniectomy.   Acute hypoxic respiratory failure requiring mechanical ventilation: Pt was intubated on 4/28 for acute respiratory failure. Likely related to his CVA. MRI brain 5/1 revealed slightly increased rightward midline shiftwith a small amount ofherniation beneath the anterior falx. New consolidation on CXR on 4/30 highly c/f HCAP. Vent settings: FiO2 50%, PEEP 10, Plat 22. Most recent ABG pH 7.4, pCO2 42, pO2 106, bicarb 26.5. - Continue Vent support - PNA coverage as below   -augmentin to complete 7 days total. Transitioned to IV unasyn today d/t ileus - VAP protocol in place - Wean PEEP and FiO2 for sats greater than 90%. -down on settings to 50%. Hopeful for cont improvement and sbt in next day or two however due to underlying CVA pt is not following commands.  -minimal sedation with only 2 Bolus fentanyl/24hr  Pneumonia: New consolidation on 4/30 highly c/f CAP vs HCAP. Cultures grew beta-lactamase positive Moraxella catarrhalis and Haemophilus influenzae. Initially on cefepime now narrowed to Augmentin. Pt still has persistent low grade fever to 100 with WBC increased from 2 days ago (on prednisone). - Continue Augmentin for total of 7 days. Transitioned to IV unasyn today d/t ileus. - WBC 12.4 from 10.5 (5/3). Repeat CXR tomorrow am; however, low-grade fever and mild leukocytosis can be explained by persistent ileus.  Wheezing c/f COPD exacerbation: Pt has had persistent wheezing throughout his hospitalization. Prednisone started on 5/2 with c/f COPD exacerbation. Wheezing improved today.  - Continue BDs - Last dose of prednisone today - Continue vent support as above   Large Left ICA/MCA CVA s/p IR thrombectomy and left decompressive hemicraniectomy  significant cerebral edema: Pt presented on 4/28 with aforementioned  stroke and was taken for IR thrombectomy for left ICA and left M1 occlusions. Repeat head CT was obtained following the procedure out of concern for possible hemorrhage and showed moderate basal ganglia hemorrhage vs stroke. Repeat MRI revealed that the ICA and MCA re-occluded with change in FLAIR sequence excluding him from repeat thrombectomy. Neurology primary team and managing.  - Management per neurology  - Maintain neuro protective measures; goal for eurothermia, euglycemia, normoxia - Nutrition and bowel regimen  - Seizure precautions  - AEDs per neurology - Allow for hypernatremia, Na goal 155-160  Ileus: Pt began to have abdominal distension and decreased bowel sounds on 5/2; KUB showed distended colonic loop in RLQ, likely at the cecum. Tube feeds were held. Pt continues to have anal leakage. F/u KUB revealed bowel dilatation with increase in small bowel dilatation and slightly less dilatation of the cecum compared to 2 days prior. KUB on 5/5 revealed no interval improvement of ileus. - Flexiseal. On Sennosides, Miralax, PRN Dulcolax . Will increase Reglan to 10mg . Need to continue to dose despite diarrhea with worsening imaging. Will also add lactulose. - tf currently on hold - K+ down to 3.9 from 4.7 yesterday. BMP, mag, and phos tomorrow am.  FEN: Pt has hypernatremia throughout hospitalization, goal 155-160 per neurology. Also has had hypokalemia; most recent K 3.9.   -  Management per neurology  - Follow Na q6hrs  - Supplement K as needed Best practice:  Diet: Tube Feeds on hold 2/2 ileus Pain/Anxiety/Delirium protocol (if indicated): minimize VAP protocol (if indicated):per protocol DVT prophylaxis: SCDs, heparin GI prophylaxis: Protonix Glucose control: WNL Mobility: Bedrest Code Status: FULL Family Communication: per primary team  Disposition: ICU   Labs   CBC: Recent Labs  Lab 07/02/19 0759 07/02/19 0806 07/03/19 0900 07/03/19 0900 07/04/19 0500 07/05/19 0413  07/06/19 0422 07/07/19 0313 07/08/19 0711  WBC 11.0*   < > 11.5*  --  11.1* 10.0 10.5  --  12.4*  NEUTROABS 9.5*  --   --   --   --   --   --   --   --   HGB 14.9   < > 13.9   < > 13.7 13.8 15.1 12.6* 13.4  HCT 44.0   < > 42.5   < > 42.6 43.1 47.2 37.0* 42.3  MCV 84.1   < > 88.9  --  88.2 89.8 92.5  --  93.8  PLT 51*   < > 61*  --  68* 88* 93*  --  92*   < > = values in this interval not displayed.    Basic Metabolic Panel: Recent Labs  Lab 07/02/19 0759 07/02/19 0806 07/02/19 1832 07/02/19 1951 07/03/19 0500 07/03/19 0900 07/03/19 1708 07/03/19 1951 07/04/19 0500 07/04/19 0835 07/05/19 0413 07/05/19 0900 07/06/19 0422 07/06/19 1100 07/07/19 0313 07/07/19 0313 07/07/19 0628 07/07/19 1105 07/07/19 1650 07/07/19 2146 07/08/19 0711  NA 149*   < >  --    < > 158*   < >  --    < > 158*   < > 158*   < > 151*   < > 153*   < > 154* 152* 151* 149* 150*  K 4.2   < >  --    < > 3.7  --   --   --  3.3*   < > 3.5  --  4.2  --  3.3*  --   --  4.7  --   --  3.9  CL 118*   < >  --   --  127*  --   --   --  125*  --  125*  --  114*  --   --   --   --  116*  --   --  119*  CO2 23   < >  --   --  24  --   --   --  27  --  26  --  27  --   --   --   --  25  --   --  26  GLUCOSE 160*   < >  --   --  158*  --   --   --  132*  --  127*  --  114*  --   --   --   --  140*  --   --  103*  BUN 8   < >  --   --  15  --   --   --  19  --  22*  --  21*  --   --   --   --  21*  --   --  17  CREATININE 0.97   < >  --   --  0.89   < >  --   --  1.01  --  1.03  --  1.06  --   --   --   --  1.01  --   --  0.96  CALCIUM 8.4*   < >  --   --  8.6*  --   --   --  9.1  --  9.2  --  8.8*  --   --   --   --  8.8*  --   --  8.8*  MG 1.8   < > 1.7  --  2.1  --  2.2  --   --   --   --   --   --   --   --   --   --  2.5*  --   --  2.5*  PHOS 2.3*  --  1.6*  --  1.5*  --  2.5  --   --   --   --   --   --   --   --   --   --   --   --   --  2.6   < > = values in this interval not displayed.   GFR: Estimated  Creatinine Clearance: 119 mL/min (by C-G formula based on SCr of 0.96 mg/dL). Recent Labs  Lab 07/04/19 0500 07/05/19 0413 07/06/19 0422 07/08/19 0711  WBC 11.1* 10.0 10.5 12.4*    Liver Function Tests: Recent Labs  Lab 07/02/19 0759 07/03/19 0500 07/04/19 0500 07/05/19 0413 07/06/19 0422  AST 28 30 27 28  37  ALT 34 35 29 30 32  ALKPHOS 34* 34* 36* 40 40  BILITOT 0.8 0.6 0.8 0.7 1.2  PROT 5.1* 4.9* 5.4* 5.6* 5.5*  ALBUMIN 3.0* 2.7* 2.6* 2.5* 2.3*   No results for input(s): LIPASE, AMYLASE in the last 168 hours. No results for input(s): AMMONIA in the last 168 hours.  ABG    Component Value Date/Time   PHART 7.410 07/07/2019 0313   PCO2ART 42.0 07/07/2019 0313   PO2ART 106 07/07/2019 0313   HCO3 26.5 07/07/2019 0313   TCO2 28 07/07/2019 0313   ACIDBASEDEF 3.0 (H) 07/02/2019 0806   O2SAT 98.0 07/07/2019 0313     Coagulation Profile: Recent Labs  Lab 07/01/19 1821  INR 1.4*    Cardiac Enzymes: No results for input(s): CKTOTAL, CKMB, CKMBINDEX, TROPONINI in the last 168 hours.  HbA1C: Hgb A1c MFr Bld  Date/Time Value Ref Range Status  07/02/2019 04:31 AM 5.5 4.8 - 5.6 % Final    Comment:    (NOTE) Pre diabetes:          5.7%-6.4% Diabetes:              >6.4% Glycemic control for   <7.0% adults with diabetes     CBG: Recent Labs  Lab 07/07/19 1549 07/07/19 1943 07/07/19 2323 07/08/19 0335 07/08/19 0806  GLUCAP 108* 104* 100* 98 87

## 2019-07-09 ENCOUNTER — Inpatient Hospital Stay (HOSPITAL_COMMUNITY): Payer: Medicaid Other

## 2019-07-09 DIAGNOSIS — K567 Ileus, unspecified: Secondary | ICD-10-CM

## 2019-07-09 LAB — PHOSPHORUS: Phosphorus: 2.6 mg/dL (ref 2.5–4.6)

## 2019-07-09 LAB — GLUCOSE, CAPILLARY
Glucose-Capillary: 109 mg/dL — ABNORMAL HIGH (ref 70–99)
Glucose-Capillary: 104 mg/dL — ABNORMAL HIGH (ref 70–99)
Glucose-Capillary: 84 mg/dL (ref 70–99)
Glucose-Capillary: 91 mg/dL (ref 70–99)
Glucose-Capillary: 112 mg/dL — ABNORMAL HIGH (ref 70–99)
Glucose-Capillary: 108 mg/dL — ABNORMAL HIGH (ref 70–99)

## 2019-07-09 LAB — MAGNESIUM: Magnesium: 2.4 mg/dL (ref 1.7–2.4)

## 2019-07-09 LAB — BASIC METABOLIC PANEL
Anion gap: 8 (ref 5–15)
BUN: 17 mg/dL (ref 6–20)
CO2: 27 mmol/L (ref 22–32)
Calcium: 8.7 mg/dL — ABNORMAL LOW (ref 8.9–10.3)
Chloride: 116 mmol/L — ABNORMAL HIGH (ref 98–111)
Creatinine, Ser: 1.09 mg/dL (ref 0.61–1.24)
GFR calc Af Amer: >60 mL/min (ref >60)
GFR calc non Af Amer: >60 mL/min (ref >60)
Glucose, Bld: 117 mg/dL — ABNORMAL HIGH (ref 70–99)
Potassium: 3.5 mmol/L (ref 3.5–5.1)
Sodium: 151 mmol/L — ABNORMAL HIGH (ref 135–145)

## 2019-07-09 LAB — CBC
HCT: 42 % (ref 39.0–52.0)
Hemoglobin: 13.4 g/dL (ref 13.0–17.0)
MCH: 29.5 pg (ref 26.0–34.0)
MCHC: 31.9 g/dL (ref 30.0–36.0)
MCV: 92.3 fL (ref 80.0–100.0)
Platelets: 106 10*3/uL — ABNORMAL LOW (ref 150–400)
RBC: 4.55 MIL/uL (ref 4.22–5.81)
RDW: 14 % (ref 11.5–15.5)
WBC: 12.6 10*3/uL — ABNORMAL HIGH (ref 4.0–10.5)
nRBC: 0 % (ref 0.0–0.2)

## 2019-07-09 MED ORDER — POTASSIUM CHLORIDE 10 MEQ/100ML IV SOLN
10.0000 meq | INTRAVENOUS | Status: AC
  Administered 2019-07-09 (×3): 10 meq via INTRAVENOUS
  Filled 2019-07-09 (×3): qty 100

## 2019-07-09 MED ORDER — SODIUM CHLORIDE 0.9 % IV SOLN
INTRAVENOUS | Status: DC | PRN
  Administered 2019-07-09 – 2019-07-14 (×2): 250 mL via INTRAVENOUS

## 2019-07-09 MED ORDER — SENNOSIDES 8.8 MG/5ML PO SYRP
5.0000 mL | ORAL_SOLUTION | Freq: Every evening | ORAL | Status: DC | PRN
  Administered 2019-07-28: 5 mL
  Filled 2019-07-09 (×2): qty 5

## 2019-07-09 MED ORDER — VITAL HIGH PROTEIN PO LIQD
1000.0000 mL | ORAL | Status: DC
  Administered 2019-07-09 – 2019-07-12 (×2): 1000 mL

## 2019-07-09 NOTE — Progress Notes (Signed)
Nutrition Follow-up  DOCUMENTATION CODES:   Obesity unspecified  INTERVENTION:   Vital High Protein @ 20 ml/hr (480 ml/day) via OG tube (Provides: 480 kcal and 42 grams protein)   As able recommend: Vital High Protein @ 50 ml/hr (1200 ml/day) 60 ml Prostat BID  Provides: 1600 kcal, 165 grams protein, and 1003 ml free water.   NUTRITION DIAGNOSIS:   Inadequate oral intake related to inability to eat as evidenced by NPO status. Ongoing.   GOAL:   Patient will meet greater than or equal to 90% of their needs Not met.   MONITOR:   I & O's  REASON FOR ASSESSMENT:   Consult, Ventilator Enteral/tube feeding initiation and management  ASSESSMENT:   Pt with PMH of smoking and COPD admitted with L ICA/MCA s/p IR for revascularization however pt had re-occlusion now s/p emergent L hemicraniectomy.   Pt discussed during ICU rounds and with RN.  Per RN pt developed abd distention on 5/2. KUB showed distended colonic loop in RLQ likely at cecum. TF held. Reglan added.  Per MD would need trach and PEG. MD discussing plan of care with family.  Plan for cortrak tomorrow.   4/30 cortrak placed; tip gastric 5/2 TF held due to ileus 5/3 TF resume at trickle  5/4 TF held due to worsening abd distention; reglan added 5/5 per xray ileus no better; reglan dose increased 5/6 trickle TF started   Patient is currently intubated on ventilator support MV: 11.5 L/min Temp (24hrs), Avg:99.5 F (37.5 C), Min:98.8 F (37.1 C), Max:100.3 F (37.9 C)  Medications reviewed and include: reglan 10 mg every 6 hours (4 more doses) MVI, miralax 10 mEq KCl x 2 more doses  Labs reviewed: Na 151 (H)   Diet Order:   Diet Order            Diet NPO time specified  Diet effective now              EDUCATION NEEDS:   No education needs have been identified at this time  Skin:  Skin Assessment: Reviewed RN Assessment  Last BM:  5/5 400 ml via rectal tube after lactulose given  Height:    Ht Readings from Last 1 Encounters:  07/02/19 6' (1.829 m)    Weight:   Wt Readings from Last 1 Encounters:  07/09/19 109.2 kg    Ideal Body Weight:  80.9 kg  BMI:  Body mass index is 32.65 kg/m.  Estimated Nutritional Needs:   Kcal:  1400-1600  Protein:  >161 grams  Fluid:  >1.5 L/day  Lockie Pares., RD, LDN, CNSC See AMiON for contact information

## 2019-07-09 NOTE — Progress Notes (Signed)
STROKE TEAM PROGRESS NOTE   INTERVAL HISTORY No family at bedside.  Patient still intubated, on vent, started weaning.  Abdomen distention improved with bowel movement.  Patient open eyes on voice, neuro stable.  CCM Dr. Vassie Loll has discussed with wife regarding tracheostomy, she will discuss with other family members.  Vitals:   07/09/19 0745 07/09/19 0800 07/09/19 0900 07/09/19 1000  BP: 110/66 113/72 121/72 122/82  Pulse: 79 78 78 91  Resp: 18 20 (!) 26 (!) 26  Temp:      TempSrc:      SpO2: 99% 99% 97% 93%  Weight:      Height:       CBC:  Recent Labs  Lab 07/08/19 0711 07/09/19 0642  WBC 12.4* 12.6*  HGB 13.4 13.4  HCT 42.3 42.0  MCV 93.8 92.3  PLT 92* 106*   Basic Metabolic Panel:  Recent Labs  Lab 07/08/19 0711 07/09/19 0642  NA 150* 151*  K 3.9 3.5  CL 119* 116*  CO2 26 27  GLUCOSE 103* 117*  BUN 17 17  CREATININE 0.96 1.09  CALCIUM 8.8* 8.7*  MG 2.5* 2.4  PHOS 2.6 2.6   Lipid Panel:     Component Value Date/Time   CHOL 92 07/02/2019 0431   TRIG 137 07/03/2019 0500   HDL 30 (L) 07/02/2019 0431   CHOLHDL 3.1 07/02/2019 0431   VLDL 17 07/02/2019 0431   LDLCALC 45 07/02/2019 0431   HgbA1c:  Lab Results  Component Value Date   HGBA1C 5.5 07/02/2019   Urine Drug Screen:     Component Value Date/Time   LABOPIA NONE DETECTED 07/01/2019 1930   COCAINSCRNUR NONE DETECTED 07/01/2019 1930   LABBENZ POSITIVE (A) 07/01/2019 1930   AMPHETMU NONE DETECTED 07/01/2019 1930   THCU NONE DETECTED 07/01/2019 1930   LABBARB NONE DETECTED 07/01/2019 1930    Alcohol Level     Component Value Date/Time   ETH <10 07/01/2019 0759    IMAGING past 24 hours CT HEAD WO CONTRAST  Result Date: 07/09/2019 CLINICAL DATA:  Stroke follow-up EXAM: CT HEAD WITHOUT CONTRAST TECHNIQUE: Contiguous axial images were obtained from the base of the skull through the vertex without intravenous contrast. COMPARISON:  Five days ago FINDINGS: Brain: Cytotoxic edema throughout the  entire left MCA and ACA distributions with bulging through the craniectomy defect. There is also unchanged infarct at the level of the left thalamus. There is subarachnoid hemorrhage and cortical petechial hemorrhage. No measurable hematoma. Midline shift measures 5 mm. No entrapment or contralateral infarct. Vascular: Dense left distal ICA and MCA. Skull: Left craniectomy flap. Sinuses/Orbits: Negative orbits and sinuses IMPRESSION: Left ACA and MCA territory infarct with unchanged degree of subarachnoid hemorrhage. Swelling has progressed with greater herniation through the craniectomy defect and 5 mm of midline shift. Electronically Signed   By: Marnee Spring M.D.   On: 07/09/2019 08:09   DG Chest Port 1 View  Result Date: 07/09/2019 CLINICAL DATA:  Acute respiratory failure. EXAM: PORTABLE CHEST 1 VIEW COMPARISON:  Jul 06, 2019. FINDINGS: The heart size and mediastinal contours are within normal limits. Endotracheal and feeding tubes are noted in grossly good position. Left lung is clear. No pneumothorax is noted. Mild right basilar atelectasis or infiltrate is noted with small pleural effusion. The visualized skeletal structures are unremarkable. IMPRESSION: Endotracheal and feeding tubes are noted in grossly good position. Mild right basilar atelectasis or infiltrate is noted with small pleural effusion. Electronically Signed   By: Lupita Raider  M.D.   On: 07/09/2019 07:50   PHYSICAL EXAM   Temp:  [98.8 F (37.1 C)-100.3 F (37.9 C)] 99 F (37.2 C) (05/06 0737) Pulse Rate:  [65-98] 91 (05/06 1000) Resp:  [18-28] 26 (05/06 1000) BP: (106-133)/(65-82) 122/82 (05/06 1000) SpO2:  [90 %-99 %] 93 % (05/06 1000) FiO2 (%):  [40 %-50 %] 40 % (05/06 0810) Weight:  [109.2 kg] 109.2 kg (05/06 0436)  General - Well nourished, well developed, intubated off sedation.   Ophthalmologic - fundi not visualized due to noncooperation.  Cardiovascular - Regular rate and rhythm.  Abdomen -mildly  distended, tympanic sound on percussion  Neuro - intubated off sedation, eyes closed but able to open on repeated voice stimulation, not following commands. With eye opening, eyes left gaze preference position, not blinking to visual threat bilaterally, doll's eyes present, PERRL. Corneal reflex weak on the right and brisk on the left, gag and cough present. Breathing over the vent.  Facial symmetry not able to test due to ET tube.  Tongue protrusion not cooperative. Spontaneous and purposeful movement left UE 2/5 and LE 2/5. However, right UE and LE plegic. DTR 1+ and no babinski. Sensation, coordination and gait not tested.    ASSESSMENT/PLAN [name] with history of COPD, ongoing tobacco use, and psoriasis, presenting with dense LMCA syndrome. Attempt for Left ICA and Left M1 thrombectomy was done emergently with TICI 2b flow. Unfortunately, the pt had post procedure hemorrhage and reocclusion of the vessels. F/u imaging suggested early edema and pt underwent prophylactic decompressive hemicraniectomy.  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  Stroke etiology unknown  Code Stroke CT head showed early hypodensity in nearly entire LMCA. ASPECTS 6-7 per notes.  CTA head & neck occlusion cervical ICA on the left. This is most likely due to dissection. The patient has minimal atherosclerotic disease.  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  There is poor collateral circulation on the left.   CTP CBF large core volume of 266 mL, perfusion volume 313 mL, mismatch volume 47 mL.  ASPECTS using CTP by Dr. Chestine Spore 6 or 7.   IR - TICI2c revascularization  MRI   Restricted diffusion throughout the majority of the left MCA and ACA vascular territories consistent with acute ischemia.  MRA - reocclusion of the intracranial left internal carotid artery. No flow related signal is seen within the left middle cerebral artery.  CT Head 07/04/19 - Slightly increased rightward midline shift with a small amount of herniation beneath the anterior falx. Otherwise unchanged examination.  CT repeat 5/6 stable infarcts and SAH but increasing edema w/ midline shift now 5mm and herniation through crani site  2D Echo - EF 55 - 60%. No cardiac source of emboli identified.   LDL 45  HgbA1c 5.5  subq heparin for VTE prophylaxis  No antithrombotic prior to admission, now on none d/t hemorrhagic conversion  Therapy recommendations:  SNF  Disposition:  pending  Respiratory failure  Intubated on vent  CCM on board  CXR 07/03/19- New consolidation in the right lower lobe and left infrahilar region concerning for pneumonia. Possible small layering right effusion.  CXR 5/3 mild pulm edema w/ atx vs infiltrate/aspiration  On cefepime 4/30>>5/2 for pneumonia  Augmentin 5/2>>5/4  Unasyn 5/5>>  Likely need trach and PEG  COPD exacerbation   on prednisone  CCM on board  Cerebral edema  MRI  Restricted diffusion throughout the majority of the left MCA and ACA vascular territories consistent with acute ischemia.  CT Head 07/04/19 - Slightly increased rightward midline shift with a small amount of herniation beneath the anterior falx. Otherwise unchanged examination.  S/p [facility] 4/28  CT repeat 5/6 stable infarcts and SAH but increasing edema w/ midline shift now 66mm and herniation through crani site  On keppra. Plan 2 wk course per NSG  Na 158->159->160->158->156->151->151->149->150->151  Off 3%. On D5NS @ 16  Gradually allow Na normalize  Fever  HCAP  Tmax 100.4->100.8->100.5->101.9->100->100.5->99.9  Leukocytosis -  6.6->11.0->11.5->11.1->10.0->10.5->12.4->12.6  CXR 5/3 - Suspected mild pulmonary edema with small layering effusions and associated bibasilar opacities, atelectasis versus infiltrate/aspiration.  UA neg  Sputum moraxella, H. flu   On cefepime 4/30>>5/2  Augmentin 5/2>>5/4, Unasyn 5/5>>5/11  Blood culture NGTD   Hypertension  Home meds:  None  Current meds: Normodyne prn ; Levophed prn  stable  SBP goal < 160 due to hemorrhagic conversion . Long-term BP goal normotensive  Dysphagia   NPO  On TF @ 25 -> 50->stopped d/t ileus->trickle feeds->again stopped->trickle feelds  On D5NS  Plan [place]rak 5/7  Dietitian on board  Ileus, improved  abd distension w/ decreased BS  KUB 5/2 distended colonic loop  TF on hold -> trickle feeds-> again stopped->trickle feeds @ 20  miralax   flexiseal following laxatives  On reglan, laxatives on hold, decrease fentanyl    Plan Coretrak 5/7  K 4.2->3.3->3.9->3.5 - supplement - keep K > 4.0  CCM on board  Tobacco abuse  Current smoker  Smoking cessation counseling will be provided if able  Other Stroke Risk Factors  Obesity, Body mass index is 32.65 kg/m., recommend weight loss, diet and exercise as appropriate   Other Active Problems  Code Status - Full code  K 4.2->3.3->3.9->3.5 - supplement - keep K > 4.0  Thrombocytopenia - 166->51->61->68->88->93->92->106 - monitor - improving  Agitation. Wrist restraint  Hospital day # 8  This patient is critically ill and at significant risk of neurological worsening, death and care requires constant monitoring of vital signs, hemodynamics,respiratory and cardiac monitoring, extensive review of multiple databases, frequent neurological assessment, discussion with family, other specialists and medical decision making of high complexity. I spent 35 minutes of neurocritical care time  in the care of  this patient. I discussed with Dr. Elsworth Soho.    Rosalin Hawking, MD PhD Stroke  Neurology 07/09/2019 10:31 AM  To contact Stroke Continuity provider, please refer to [email or web address] After hours, contact General Neurology

## 2019-07-09 NOTE — Progress Notes (Signed)
NAME:  Lee Hooper, MRN:  [id number], DOB:  11-Nov-1970, LOS: 8 ADMISSION DATE:  07/01/2019, CONSULTATION DATE:  07/01/19 REFERRING MD:  Aroor MD, CHIEF COMPLAINT:  CVA    Brief History   Lee Hooper is a 49 yo M who presented as a code stroke after being found aphasic and nonverbal by his wife. CT/CTA studies showed left ICA/MCA thrombosis and pt was taken for IR thrombectomy for left ICA and left M1 occlusions. Repeat head CT was obtained following the procedure out of concern for possible hemorrhage and showed moderate basal ganglia hemorrhage vs stroke. Repeat MRI revealed that the ICA and MCA re-occluded with change in FLAIR sequence excluding him from repeat thrombectomy. He underwent a L hemicraniectomy. He was intubated for airway protection. PCCM consulted for vent management.  Past Medical History  Psoriasis  COPD  Tobacco Use  Significant Hospital Events   4/28: Admit; L ICA/MCA Thrombectomy with re-occlusion --> left hemicraniectomy, intubated for acute hypoxic respiratory failure   Consults:  IR PCCM  Neuro (Primary) Neurosurgery  Procedures:  4/28 IR - L ICA/MCA Thrombectomy 4/28 L hemicraniectomy 4/28 Intubation  Significant Diagnostic Tests:  4/28 Admit CT Head Code Stroke:  Hyperdense left ICA and MCA compatible with acute thrombus. No acute infarct or hemorrhage.  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 The patient has minimal atherosclerotic disease. The left internal carotid artery is occluded through the terminus and extending in the left M1 M2 and M3 branches. There is poor collateral circulation on the left. There is a large territory infarct involving the left hemisphere. Infarct volume 266 mL.  4/28 Admit MR Brain:  Restricted diffusion throughout much of the left MCA vascular territory consistent with acute ischemia. Restricted diffusion consistent  with acute ischemia is also present within the paramedian left frontoparietal lobes, although this is less well assessed due to the degree of motion degradation at the level of the vertex. There is little if any corresponding T2/FLAIR hyperintensity at these sites. No significant mass effect. No midline shift.  4/28 Post Thrombectomy CT Head: : 1. 3.0 x 2.6 x 3.9 cm region of hyperdensity centered within the left basal ganglia, left subinsular region and inferomedial left temporal lobe likely reflecting a combination of parenchymal hematoma and contrast staining.  2. Edema with loss of gray-white differentiation within the left basal ganglia, left insula and anterior left temporal lobe, likely acute infarction. Subtle changes of acute infarction are also suspected within the paramedian left frontal lobe ACA vascular territory. 3. Scattered small-volume subarachnoid hemorrhage along the left cerebral hemisphere. 4. Regional mass effect with effacement of the left lateral ventricle temporal horn. No midline shift.  4/28 Post Thrombectomy MR Brain:  1. Restricted diffusion throughout the majority of the left MCA and ACA vascular territories consistent with acute ischemia. 2. No significant mass effect at this time. No midline shift. 3. The acute parenchymal hemorrhage and/or contrast staining centered within left basal ganglia and inferomedial left temporal lobe on prior head CT does not appear significantly changed. Based on the MR appearance, it is suspected that a significant component of this previously demonstrated hyperdensity reflects contrast staining. 4. Redemonstrated small volume subarachnoid hemorrhage overlying the left cerebral hemisphere. Small volume subarachnoid hemorrhage is also questioned along the right cerebral hemisphere posteriorly.  4/29 Post Hemicraniectomy CT head:  Complete left ACA/MCA territory infarction with swollen brain bulging through craniectomy defect. No midline  shift or entrapment. Petechial hemorrhage at left basal ganglia. Extraaxial hemorrhage along surface of the infarct.   4/30 CXR: increasing opacification of R lower lung field. (personal interpretation)  5/1 Head CT > Slightly increased rightward midline shift with a small amount of herniation beneath the anterior falx. Otherwise unchanged Examination.  5/2 abdominal Xray: Air-filled distended colonic loop in the right lower quadrant measuring 12.5 cm likely the cecum and likely due to ileus. Recommend serial follow-up abdominal films.  5/4 abdominal Xray:  Bowel dilatation with increase in small bowel dilatation and slightly less dilatation of the cecum compared to 2 days prior. Suspect ileus as most likely etiology given this overall appearance.  5/5 abdominal Xray:  Stable appearance of an adynamic ileus. No convincing evidence of free air, pneumatosis or significant interval change.  5/6 CXR: Endotracheal and feeding tubes are noted in grossly good position. Mild right basilar atelectasis or infiltrate is noted with small pleural effusion.  5/6 Head CT: Left ACA and MCA territory infarct with unchanged degree of subarachnoid hemorrhage. Swelling has progressed with greater herniation through the craniectomy defect and 5 mm of midline shift.  Micro Data:  4/28: SARS-CoV-2/Influenza A/Influenza B PCR: Negative  4/30: Respiratory culture: Abundant Moraxella Catarrhalis, Haemophilus Influenzae, beta lactamase positive  5/2: Urine culture: NGTD  5/2: BC x2: NGTD  Antimicrobials:  4/30 - 5/2: Cefepime 5/2 - 5/4: Augmentin  5/5 - Present: Unasyn  Interim history/subjective:  5/3: NAE. Was taken off precedex yesterday and pt opened his eyes. Prednisone 50 mg qd was begun. He had a persistent fever around 38.2, HR 110s, BP 140s/60s. Pt required increased O2 with secretions; CXR showed mild edema with small effusions c/f aspiration vs opacification. Blood culture x2 and urine culture  collected, NGTD. ABG pending. Currently satting 92% on PRVC FiO2 80, PEEP 10, Plateau 22. WBC declined to 10.5 from 11.1, Na 151 from 156. Tube feeds were held d/t distended colonic loop in RLQ on KUB, to be continued today. Thrombocytopenia improved to 93 from 81.   5/4: Patient remains intubated with spontaneous movement of left arm and leg. Abdomen continues to be distended with KUB concerning for ileus; however, he continues to experience episodes of diarrhea. Tube feedings are currently on hold. Sodium 154 this am (goal 155-160). Continue IVF. ABG yesterday: pH 7.4, pCO2 42, pO2 106, bicarb 26.5. Satting 91-95% on PRVC FiO2 60, PEEP 10, plateau 20.  Blood culture x2 and urine culture collected, NGTD.WBC pending today.  5/5: NAE. Pt remains intubated on PRN fetanyl. Will open eyes and spontaneously move left leg and arm. Abdomen remains distended with decreased bowel sounds. Continues to have liquid stool in rectal pouch. Tube feedings remain on hold. Sodium 150 this am. IVF @ 75 mL/hr. Satting 90-100% on PRVC 620 mL, FiO2 50% with PEEP 10, plateau 20. Blood culture NGTD.  5/6: NAE overnight. Intubated with no sedation. Pt is opening his eyes and moving left foot and hand to command this morning. Pt had bowel movement overnight with 400 cc of stool per nurse. Abdomen less distended and soft this am. Will restart trickle tube feeds and d/c D5NS. Sodium 151 this am. Satting 92% on PSV;CPAP FiO2 40% with PEEP 5 and pressure support 5. Blood culture NGTD.   Objective   Blood pressure 113/72, pulse 78, temperature 99 F (37.2 C), temperature source Axillary, resp. rate 20, height 6' (1.829 m), weight 109.2 kg, SpO2 99 %.    Vent Mode: PSV;CPAP FiO2 (%):  [40 %-50 %]  40 % Set Rate:  [16 bmp] 16 bmp Vt Set:  [620 mL] 620 mL PEEP:  [5 cmH20-10 cmH20] 5 cmH20 Pressure Support:  [5 cmH20] 5 cmH20 Plateau Pressure:  [18 cmH20-24 cmH20] 18 cmH20   Intake/Output Summary (Last 24 hours) at 07/09/2019  0908 Last data filed at 07/09/2019 0800 Gross per 24 hour  Intake 2218.63 ml  Output 2775 ml  Net -556.37 ml   Filed Weights   07/06/19 0500 07/08/19 0427 07/09/19 0436  Weight: 112 kg 112 kg 109.2 kg    Examination: Gen: Ill appearing in NAD, intubated and on ventilation. HEENT: ETT in place, MMM.   Neck: no cervical lymphadenopathy, no JVD Heart: RRR, S1, S2, no M/R/G, no chest Nozomi Mettler tenderness Lungs: bilateral mechanical breath sounds, no crackles or wheezes Abdomen: Decreased bowel sounds, soft abdomen, mild distension, no rebound/guarding Extremities: no clubbing, cyanosis, ++ edema: pulses are +2 in bilateral upper and lower extremities Neuro: Will open eyes and move left foot and hand to command. PRN sedation Skin:  No rashes,+ multiple scaling plaques noted on flexor surfaces and abdomen  Resolved Hospital Problem list   none  Assessment & Plan:  49 yo with h/o COPD and tobacco use presented with large L MCA stroke s/p IR thrombectomy and subsequent hemorrhage s/p hemicraniectomy.   Acute hypoxic respiratory failure requiring mechanical ventilation: Pt was intubated on 4/28 for acute respiratory failure. Likely related to his CVA. MRI brain 5/1 revealed slightly increased rightward midline shiftwith a small amount ofherniation beneath the anterior falx. New consolidation on CXR on 4/30 highly c/f HCAP. Vent settings: PSV;CPAP FiO2 40%, PEEP 5, pressure support 5.  - diuresed well with 40mg  Lasix. Will repeat Lasix tomorrow. - Continue Vent support - PNA coverage as below   -augmentin to complete 7 days total. Transitioned to IV unasyn 5/6 d/t ileus - VAP protocol in place - Wean PEEP and FiO2 for sats greater than 90%. -down on settings to 40%. Hopeful for cont improvement and sbt since pt is now responding to commands. PEEP now 5. - Will start discussion regarding tracheostomy. -no sedation given past 24 hr. Has PRN fentanyl.   Pneumonia: New consolidation on 4/30  highly c/f CAP vs HCAP. Cultures grew beta-lactamase positive Moraxella catarrhalis and Haemophilus influenzae. Initially on cefepime now narrowed to Augmentin. Pt still has persistent low grade fever to 100 with WBC increased from 2 days ago (on prednisone). - Continue Augmentin for total of 7 days. Transitioned to IV unasyn 5/6 d/t ileus. - WBC 12.6 from 12.4. Repeat CXR revealed mild right basilar atelectasis or infiltrate is noted with small pleural effusion 5/6.  Wheezing c/f COPD exacerbation: Pt has had persistent wheezing throughout his hospitalization. Prednisone started on 5/2 with c/f COPD exacerbation. Wheezing improved today.  - Continue BDs - Last dose of prednisone yesterday 5/5 - Continue vent support as above   Large Left ICA/MCA CVA s/p IR thrombectomy and left decompressive hemicraniectomy  significant cerebral edema: Pt presented on 4/28 with aforementioned stroke and was taken for IR thrombectomy for left ICA and left M1 occlusions. Repeat head CT was obtained following the procedure out of concern for possible hemorrhage and showed moderate basal ganglia hemorrhage vs stroke. Repeat MRI revealed that the ICA and MCA re-occluded with change in FLAIR sequence excluding him from repeat thrombectomy. Neurology primary team and managing.  - Management per neurology  - Maintain neuro protective measures; goal for eurothermia, euglycemia, normoxia - Nutrition and bowel regimen  - Seizure precautions  -  AEDs per neurology - Allow for hypernatremia, Na goal 155-160  Ileus: Pt began to have abdominal distension and decreased bowel sounds on 5/2; KUB showed distended colonic loop in RLQ, likely at the cecum. Tube feeds were held. Pt continues to have anal leakage. F/u KUB revealed bowel dilatation with increase in small bowel dilatation and slightly less dilatation of the cecum compared to 2 days prior. KUB on 5/5 revealed no interval improvement of ileus. - Flexiseal. On Miralax,  PRN Dulcolax, PRN Senna . Will continue Reglan 10mg . Need to continue to dose despite diarrhea with worsening imaging.  - tf at trickle rate today - K+ down to 3.5 from 3.9 yesterday. Will replete K+. BMP, mag, and phos tomorrow am.  FEN: Pt has hypernatremia throughout hospitalization, goal 155-160 per neurology. Also has had hypokalemia; most recent K 3.5.   - Management per neurology  - Follow Na q6hrs  - Supplement K as needed; will replete today. Best practice:  Diet: Tube Feeds  Pain/Anxiety/Delirium protocol (if indicated): minimize VAP protocol (if indicated):per protocol DVT prophylaxis: SCDs, heparin GI prophylaxis: Protonix Glucose control: WNL Mobility: Bedrest Code Status: FULL Family Communication: per primary team  Disposition: ICU   Labs   CBC: Recent Labs  Lab 07/04/19 0500 07/04/19 0500 07/05/19 0413 07/06/19 0422 07/07/19 0313 07/08/19 0711 07/09/19 0642  WBC 11.1*  --  10.0 10.5  --  12.4* 12.6*  HGB 13.7   < > 13.8 15.1 12.6* 13.4 13.4  HCT 42.6   < > 43.1 47.2 37.0* 42.3 42.0  MCV 88.2  --  89.8 92.5  --  93.8 92.3  PLT 68*  --  88* 93*  --  92* 106*   < > = values in this interval not displayed.    Basic Metabolic Panel: Recent Labs  Lab 07/02/19 1832 07/02/19 1951 07/03/19 0500 07/03/19 0900 07/03/19 1708 07/03/19 1951 07/05/19 0413 07/05/19 0900 07/06/19 0422 07/06/19 1100 07/07/19 0313 07/07/19 0628 07/07/19 1105 07/07/19 1650 07/07/19 2146 07/08/19 0711 07/09/19 0642  NA  --    < > 158*   < >  --    < > 158*   < > 151*   < > 153*   < > 152* 151* 149* 150* 151*  K  --    < > 3.7  --   --    < > 3.5  --  4.2  --  3.3*  --  4.7  --   --  3.9 3.5  CL  --   --  127*  --   --    < > 125*  --  114*  --   --   --  116*  --   --  119* 116*  CO2  --   --  24  --   --    < > 26  --  27  --   --   --  25  --   --  26 27  GLUCOSE  --   --  158*  --   --    < > 127*  --  114*  --   --   --  140*  --   --  103* 117*  BUN  --   --  15  --    --    < > 22*  --  21*  --   --   --  21*  --   --  17 17  CREATININE  --   --  0.89   < >  --    < > 1.03  --  1.06  --   --   --  1.01  --   --  0.96 1.09  CALCIUM  --   --  8.6*  --   --    < > 9.2  --  8.8*  --   --   --  8.8*  --   --  8.8* 8.7*  MG 1.7   < > 2.1  --  2.2  --   --   --   --   --   --   --  2.5*  --   --  2.5* 2.4  PHOS 1.6*  --  1.5*  --  2.5  --   --   --   --   --   --   --   --   --   --  2.6 2.6   < > = values in this interval not displayed.   GFR: Estimated Creatinine Clearance: 103.4 mL/min (by C-G formula based on SCr of 1.09 mg/dL). Recent Labs  Lab 07/05/19 0413 07/06/19 0422 07/08/19 0711 07/09/19 0642  WBC 10.0 10.5 12.4* 12.6*    Liver Function Tests: Recent Labs  Lab 07/03/19 0500 07/04/19 0500 07/05/19 0413 07/06/19 0422  AST 30 27 28  37  ALT 35 29 30 32  ALKPHOS 34* 36* 40 40  BILITOT 0.6 0.8 0.7 1.2  PROT 4.9* 5.4* 5.6* 5.5*  ALBUMIN 2.7* 2.6* 2.5* 2.3*   No results for input(s): LIPASE, AMYLASE in the last 168 hours. No results for input(s): AMMONIA in the last 168 hours.  ABG    Component Value Date/Time   PHART 7.410 07/07/2019 0313   PCO2ART 42.0 07/07/2019 0313   PO2ART 106 07/07/2019 0313   HCO3 26.5 07/07/2019 0313   TCO2 28 07/07/2019 0313   ACIDBASEDEF 3.0 (H) 07/02/2019 0806   O2SAT 98.0 07/07/2019 0313     Coagulation Profile: No results for input(s): INR, PROTIME in the last 168 hours.  Cardiac Enzymes: No results for input(s): CKTOTAL, CKMB, CKMBINDEX, TROPONINI in the last 168 hours.  HbA1C: Hgb A1c MFr Bld  Date/Time Value Ref Range Status  07/02/2019 04:31 AM 5.5 4.8 - 5.6 % Final    Comment:    (NOTE) Pre diabetes:          5.7%-6.4% Diabetes:              >6.4% Glycemic control for   <7.0% adults with diabetes     CBG: Recent Labs  Lab 07/08/19 1603 07/08/19 1946 07/08/19 2332 07/09/19 0337 07/09/19 0735  GLUCAP 104* 102* 83 109* 104*

## 2019-07-09 NOTE — Progress Notes (Signed)
Patient transported to CT and back to 4N22 without complications.

## 2019-07-09 NOTE — Progress Notes (Signed)
49 year old man with devastating left MCA/ICA thrombosis status post thrombectomy 4/28 Status post decompressive craniectomy 4/29  Had liquid stool after lactulose requiring Flexi-Seal Head CT 5/6 shows cytotoxic edema and unchanged infarct and subarachnoid hemorrhage with swelling progressed and treated herniation through the craniectomy defect  On exam -awake, does follow commands and moves left hand and feet to command, right dense hemiplegia, bilateral ventilated breath sounds, slight decrease edema, S1-S2 regular, soft distended abdomen.  Labs show mild hypokalemia, stable hypernatremia and leukocytosis and thrombocytopenia.  Chest x-ray showed improved bibasilar effusions and right basilar atelectasis.  Impression/plan  Ileus -improving, continue Reglan for 6 doses, hold further laxatives, minimize fentanyl use, start tube feeds at trickle and plan for core track placement tomorrow  Acute respiratory failure -diuresed well with Lasix, drop PEEP to 5, start spontaneous breathing trials, completed course of prednisone for COPD exacerbation We will start discussion regarding tracheostomy, hopefully this will help to mobilization. Complete course of Unasyn for Moraxella/Haemophilus in sputum  Left CVA-occluded left internal carotid artery -Per neurology, PT consult  Wife updated at bedside  The patient is critically ill with multiple organ systems failure and requires high complexity decision making for assessment and support, frequent evaluation and titration of therapies, application of advanced monitoring technologies and extensive interpretation of multiple databases. Critical Care Time devoted to patient care services described in this note independent of APP/resident  time is 31 minutes.   Comer Locket Vassie Loll MD

## 2019-07-09 NOTE — Progress Notes (Signed)
Patient ID: Lee Hooper, male   DOB: [birth year], 49 y.o.   MRN: [id number] BP 138/75   Pulse 89   Temp 98.9 F (37.2 C) (Axillary)   Resp (!) 34   Ht 6' (1.829 m)   Wt 109.2 kg   SpO2 97%   BMI 32.65 kg/m  Opens eyes Moving purposefully wound is clean, dry, no signs of infection Seems more alert this evening.

## 2019-07-10 LAB — BASIC METABOLIC PANEL
Anion gap: 12 (ref 5–15)
BUN: 19 mg/dL (ref 6–20)
CO2: 19 mmol/L — ABNORMAL LOW (ref 22–32)
Calcium: 8.6 mg/dL — ABNORMAL LOW (ref 8.9–10.3)
Chloride: 116 mmol/L — ABNORMAL HIGH (ref 98–111)
Creatinine, Ser: 0.91 mg/dL (ref 0.61–1.24)
GFR calc Af Amer: >60 mL/min (ref >60)
GFR calc non Af Amer: >60 mL/min (ref >60)
Glucose, Bld: 104 mg/dL — ABNORMAL HIGH (ref 70–99)
Potassium: 4.4 mmol/L (ref 3.5–5.1)
Sodium: 147 mmol/L — ABNORMAL HIGH (ref 135–145)

## 2019-07-10 LAB — CBC
HCT: 41.6 % (ref 39.0–52.0)
Hemoglobin: 13.6 g/dL (ref 13.0–17.0)
MCH: 29.6 pg (ref 26.0–34.0)
MCHC: 32.7 g/dL (ref 30.0–36.0)
MCV: 90.6 fL (ref 80.0–100.0)
Platelets: 118 10*3/uL — ABNORMAL LOW (ref 150–400)
RBC: 4.59 MIL/uL (ref 4.22–5.81)
RDW: 13.9 % (ref 11.5–15.5)
WBC: 12.6 10*3/uL — ABNORMAL HIGH (ref 4.0–10.5)
nRBC: 0 % (ref 0.0–0.2)

## 2019-07-10 LAB — GLUCOSE, CAPILLARY
Glucose-Capillary: 101 mg/dL — ABNORMAL HIGH (ref 70–99)
Glucose-Capillary: 106 mg/dL — ABNORMAL HIGH (ref 70–99)
Glucose-Capillary: 118 mg/dL — ABNORMAL HIGH (ref 70–99)
Glucose-Capillary: 119 mg/dL — ABNORMAL HIGH (ref 70–99)
Glucose-Capillary: 93 mg/dL (ref 70–99)
Glucose-Capillary: 98 mg/dL (ref 70–99)

## 2019-07-10 LAB — CULTURE, BLOOD (ROUTINE X 2)
Culture: NO GROWTH
Culture: NO GROWTH
Special Requests: ADEQUATE

## 2019-07-10 LAB — MAGNESIUM: Magnesium: 2.5 mg/dL — ABNORMAL HIGH (ref 1.7–2.4)

## 2019-07-10 LAB — PHOSPHORUS: Phosphorus: 3.2 mg/dL (ref 2.5–4.6)

## 2019-07-10 MED ORDER — FUROSEMIDE 10 MG/ML IJ SOLN
40.0000 mg | Freq: Once | INTRAMUSCULAR | Status: AC
  Administered 2019-07-10: 40 mg via INTRAVENOUS
  Filled 2019-07-10: qty 4

## 2019-07-10 NOTE — Progress Notes (Signed)
STROKE TEAM PROGRESS NOTE   INTERVAL HISTORY No family at bedside.  Patient condition unchanged.  Still intubated, open eyes on voice, spontaneously moving on the left side, on trickle feeds.  Dr. Elsworth Soho has discussed with wife, who agreed with tracheostomy over the weekend if needed.  Vitals:   07/10/19 0733 07/10/19 0800 07/10/19 0806 07/10/19 0900  BP: 123/77 125/62  126/72  Pulse: 85 89  88  Resp: (!) 30 10  16   Temp:  99.6 F (37.6 C)    TempSrc:  Axillary    SpO2: 91% 91% 91% 92%  Weight:      Height:       CBC:  Recent Labs  Lab 07/09/19 0642 07/10/19 0525  WBC 12.6* 12.6*  HGB 13.4 13.6  HCT 42.0 41.6  MCV 92.3 90.6  PLT 106* 893*   Basic Metabolic Panel:  Recent Labs  Lab 07/09/19 0642 07/10/19 0525  NA 151* 147*  K 3.5 4.4  CL 116* 116*  CO2 27 19*  GLUCOSE 117* 104*  BUN 17 19  CREATININE 1.09 0.91  CALCIUM 8.7* 8.6*  MG 2.4 2.5*  PHOS 2.6 3.2   Lipid Panel:     Component Value Date/Time   CHOL 92 07/02/2019 0431   TRIG 137 07/03/2019 0500   HDL 30 (L) 07/02/2019 0431   CHOLHDL 3.1 07/02/2019 0431   VLDL 17 07/02/2019 0431   LDLCALC 45 07/02/2019 0431   HgbA1c:  Lab Results  Component Value Date   HGBA1C 5.5 07/02/2019   Urine Drug Screen:     Component Value Date/Time   LABOPIA NONE DETECTED 07/01/2019 1930   COCAINSCRNUR NONE DETECTED 07/01/2019 1930   LABBENZ POSITIVE (A) 07/01/2019 1930   AMPHETMU NONE DETECTED 07/01/2019 1930   THCU NONE DETECTED 07/01/2019 1930   LABBARB NONE DETECTED 07/01/2019 1930    Alcohol Level     Component Value Date/Time   ETH <10 07/01/2019 0759    IMAGING past 24 hours No results found.  PHYSICAL EXAM  Temp:  [98.9 F (37.2 C)-100.4 F (38 C)] 99.6 F (37.6 C) (05/07 0800) Pulse Rate:  [79-112] 88 (05/07 0900) Resp:  [10-34] 16 (05/07 0900) BP: (120-154)/(62-91) 126/72 (05/07 0900) SpO2:  [90 %-97 %] 92 % (05/07 0900) FiO2 (%):  [40 %-60 %] 40 % (05/07 0806) Weight:  [108.5 kg] 108.5 kg  (05/07 0500)  General - Well nourished, well developed, intubated off sedation.   Ophthalmologic - fundi not visualized due to noncooperation.  Cardiovascular - Regular rate and rhythm.  Neuro - intubated off sedation, eyes closed but able to open on repeated voice stimulation, not following commands. With eye opening, eyes left gaze preference position, not blinking to visual threat bilaterally, doll's eyes present, PERRL. Corneal reflex weak on the right and brisk on the left, gag and cough present. Breathing over the vent.  Facial symmetry not able to test due to ET tube.  Tongue protrusion not cooperative. Spontaneous and purposeful movement left UE 2/5 and LE 2/5. However, right UE and LE plegic. DTR 1+ and no babinski. Sensation, coordination and gait not tested.    ASSESSMENT/PLAN Lee Hooper is a 49 y.o. male with history of COPD, ongoing tobacco use, and psoriasis, presenting with dense LMCA syndrome. Attempt for Left ICA and Left M1 thrombectomy was done emergently with TICI 2b flow. Unfortunately, the pt had post procedure hemorrhage and reocclusion of the vessels. F/u imaging suggested early edema and pt underwent prophylactic decompressive hemicraniectomy.  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  Stroke etiology unknown  Code Stroke CT head showed early hypodensity in nearly entire LMCA. ASPECTS 6-7 per notes.  CTA head & neck occlusion cervical ICA on the left. This is most likely due to dissection. The patient has minimal atherosclerotic disease.  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  There is poor collateral circulation on the left.   CTP CBF large core  volume of 266 mL, perfusion volume 313 mL, mismatch volume 47 mL.  ASPECTS using CTP by Dr. Chestine Spore 6 or 7.   IR - TICI2c revascularization  MRI  Restricted diffusion throughout the majority of the left MCA and ACA vascular territories consistent with acute ischemia.  MRA - reocclusion of the intracranial left internal carotid artery. No flow related signal is seen within the left middle cerebral artery.  CT Head 07/04/19 - Slightly increased rightward midline shift with a small amount of herniation beneath the anterior falx. Otherwise unchanged examination.  CT repeat 5/6 stable infarcts and SAH but increasing edema w/ midline shift now 31mm and herniation through crani site  2D Echo - EF 55 - 60%. No cardiac source of emboli identified.   LDL 45  HgbA1c 5.5  subq heparin for VTE prophylaxis  No antithrombotic prior to admission, now on none d/t hemorrhagic conversion  Therapy recommendations:  SNF  Disposition:  pending  Respiratory failure  Intubated on vent  CCM on board  CXR 07/03/19- New consolidation in the right lower lobe and left infrahilar region concerning for pneumonia. Possible small layering right effusion.  CXR 5/3 mild pulm edema w/ atx vs infiltrate/aspiration  On cefepime 4/30>>5/2 for pneumonia  Augmentin 5/2>>5/4  Unasyn 5/5>>5.6lasix  Likely need trach and PEG over the weekend.  Wife agreed.  COPD exacerbation, resolved  on prednisone, ended 5/5  CCM on board  Cerebral edema  MRI  Restricted diffusion throughout the majority of the left MCA and ACA vascular territories consistent with acute ischemia.  CT Head 07/04/19 - Slightly increased rightward midline shift with a small amount of herniation beneath the anterior falx. Otherwise unchanged examination.  S/p [facility] 4/28  CT repeat 5/6 stable infarcts and SAH but increasing edema w/ midline shift now 54mm and herniation through crani site  On keppra. Plan 2 wk course per NSG  Na 151->147  Off  3%. On D5NS @ 16  Okay to allow Na gradually normalize  Fever  HCAP, resolved  Tmax 99.9->102.7  Leukocytosis - 12.4->12.6->12.6  CXR 5/3 - Suspected mild pulmonary edema with small layering effusions and associated bibasilar opacities, atelectasis versus infiltrate/aspiration.  UA neg  Sputum moraxella, H. flu   On cefepime 4/30>>5/2  Augmentin 5/2>>5/4, Unasyn 5/5>>5/6  Blood culture 5/2 NGTD   Blood culture 5/7 re-sent   Hypertension  Home meds:  None  Current meds: Normodyne prn ; Levophed prn  stable  SBP goal < 160 due to hemorrhagic conversion . Long-term BP goal normotensive  Dysphagia   NPO  On TF @ 20 due to illeus  On D5NS  Coretrak 5/7 in preparation of Trach  Dietitian on board  Ileus, improved  abd distension w/ decreased BS  KUB 5/2 distended colonic loop  TF on hold -> trickle feeds-> again stopped->trickle feeds @ 20  miralax   flexiseal following laxatives  On reglan, laxatives on hold, decrease fentanyl    Coretrak 5/7  K 4.2->3.3->3.9->3.5->4.4 - supplement - keep K > 4.0  CCM on board  Tobacco abuse  Current smoker  Smoking cessation counseling will be provided if able  Other Stroke Risk Factors  Obesity, Body mass index is 32.44 kg/m., recommend weight loss, diet and exercise as appropriate   Likely undiagnosed obstructive sleep apnea based on body habitus  Other Active Problems  Code Status - Full code  K 4.2->3.3->3.9->3.5->4.4 - supplement - keep K > 4.0  Thrombocytopenia - 166->51->61->68->88->93->92->106->118 - monitor - improving  Agitation. Wrist restraint  Hospital day # 9  This patient is critically ill and at significant risk of neurological worsening, death and care requires constant monitoring of vital signs, hemodynamics,respiratory and cardiac monitoring, extensive review of multiple databases, frequent neurological assessment, discussion with family, other specialists and medical decision  making of high complexity. I spent 30 minutes of neurocritical care time  in the care of  this patient. I discussed with Dr. Vassie Loll.   Marvel Plan, MD PhD Stroke Neurology 07/10/2019 10:35 AM  To contact Stroke Continuity provider, please refer to [email or web address]. After hours, contact General Neurology

## 2019-07-10 NOTE — Progress Notes (Addendum)
49 year old man with devastating left MCA/ICA thrombosis status post thrombectomy 4/28 Status post decompressive craniectomy 4/29  Head CT 5/6 shows cytotoxic edema and unchanged infarct and subarachnoid hemorrhage with swelling progressed and treated herniation through the craniectomy defect  5/6 started following commands Diuresed well with Lasix overnight  On exam -critically ill, intubated, dense right hemiplegia, follows commands intermittently, hands still look puffy, S1-S2 regular, bilateral ventilated breath sounds minimal secretions, soft less distended abdomen.  Chest x-ray 5/6 personally reviewed shows improved bibasal effusions, mild right basilar atelectasis. Labs show improving hypernatremia, normal electrolytes otherwise, stable with mild leukocytosis and thrombocytopenia.  Impression/plan  Acute respiratory failure -repeat Lasix 40 mg, he completed course of prednisone for COPD exacerbation. Completed course of Unasyn for Moraxella/Haemophilus in sputum. He is tolerating spontaneous breathing trials although saturation slight low today, choice here is proceed straight to tracheostomy versus give him a trial of extubation if he continues to improve.  He likely has undiagnosed OSA based on body habitus I do feel we can make a strong case of proceeding straight to tracheostomy followed by aggressive rehab Discussed with wife extensively  Left CVA with occluded left internal carotid artery?  Dissection-Per neurology, PT consult, encourage passive mobilization  Ileus -improving, continue bowel regimen  New fever 102 - line & foley out, will cx blood/ resp , hold off abx  The patient is critically ill with multiple organ systems failure and requires high complexity decision making for assessment and support, frequent evaluation and titration of therapies, application of advanced monitoring technologies and extensive interpretation of multiple databases. Critical Care Time  devoted to patient care services described in this note independent of APP/resident  time is 31 minutes.   Comer Locket Vassie Loll MD

## 2019-07-10 NOTE — Progress Notes (Signed)
Occupational Therapy Treatment Patient Details Name: Lee Hooper MRN: [id number] DOB: 04-20-1970 Today's Date: 07/10/2019    History of present illness This 49 y.o. male admitted 4/28 with Rt sided hemiplegia and aphasia.  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  Repeat CT following procedure showed moderate basal ganglia hemorrhage vs. stroke   Repeat MRI revealed that the ICA and MCA re-occluded with change in FLAIR sequence excluding him from repeat thrombectomy.  He underwent a decompressive hemicraniectomy.  He waws intubated 4/28 and remains intubated.  He developed an ileus 5/2.  PMH includes: COPD, tobacco abuse, psoriasis   OT comments  Upon arrival, pt supine in bed and on vent (FiO2 40% and PEEP 5); opening his eyes to noise and his name. Pt inconsistently follow commands to perform ROM at LUE/LLE. Pt continues to present with right inattention; however, head turn to right with vent placed on right side of bed. Pt tolerating egress of bed into chair position. Continue to recommend dc to post-acute rehab and will continue to follow acutely as admitted.    Follow Up Recommendations  Other (comment);SNF(dependent on his ability to wean and progress )    Equipment Recommendations  None recommended by OT    Recommendations for Other Services      Precautions / Restrictions Precautions Precautions: Fall;Other (comment) Precaution Comments: Rt hemiplegia, likely aphasia  Restrictions Weight Bearing Restrictions: No       Mobility Bed Mobility Overal bed mobility: Needs Assistance Bed Mobility: Rolling Rolling: Total assist;+2 for physical assistance         General bed mobility comments: bed egress to bed in chair mode, pt requires totalA to shift shoulders side to side to maintain upright positioning  Transfers                 General transfer comment: unable to attempt     Balance Overall balance assessment: Needs  assistance Sitting-balance support: Bilateral upper extremity supported;Feet supported(bed in chair mode) Sitting balance-Leahy Scale: Zero Sitting balance - Comments: right lean with BUE support, PT assisting R arm to provide WB Postural control: Right lateral lean                                 ADL either performed or assessed with clinical judgement   ADL Overall ADL's : Needs assistance/impaired Eating/Feeding: NPO   Grooming: Maximal assistance;Wash/dry face;Bed level                                 General ADL Comments: Focused on bed level ROM and tolerance of upright posture with bed in egress     Vision       Perception     Praxis      Cognition Arousal/Alertness: Awake/alert;Lethargic(lethargic but awakens to command) Behavior During Therapy: Flat affect Overall Cognitive Status: Difficult to assess                                 General Comments: pt intermittently follows verbal commands for mobility with left side and to open eyes, no motor commands for R side. Pt requires max verbal cues and intermittent tactile cues to follow commands with L side        Exercises Exercises: Other exercises Other Exercises Other Exercises: PROM  to RLE including ankle PF/DF, knee flexion/extension, and hip abduction/adduction for 10 repetitions each Other Exercises: PROM shoulder flexion with eccentric AAROM lowering Other Exercises: AAROM elbow flexion and extension, requires max cues, 15 repetitions Other Exercises: L knee SAQ in bed in chair position, 6 repetitions AROM   Shoulder Instructions       General Comments pt VSS, pt intubated on vent with 40% FiO2, PEEP 5, RR consistently in mid 20s during activity. Pt BP stable when changing position to bed in chair mode.    Pertinent Vitals/ Pain       Pain Assessment: Faces Faces Pain Scale: Hurts even more Pain Location: neck Pain Descriptors / Indicators: Grimacing Pain  Intervention(s): Monitored during session;Limited activity within patient's tolerance;Repositioned  Home Living                                          Prior Functioning/Environment              Frequency  Min 2X/week        Progress Toward Goals  OT Goals(current goals can now be found in the care plan section)  Progress towards OT goals: Progressing toward goals  Acute Rehab OT Goals Patient Stated Goal: pt unable to state OT Goal Formulation: Patient unable to participate in goal setting Time For Goal Achievement: 07/21/19 Potential to Achieve Goals: Fair ADL Goals Pt Will Perform Grooming: with mod assist;bed level Additional ADL Goal #1: Pt will locate items on his Rt with mod cues/prompting Additional ADL Goal #2: Pt will maintain EOB sitting x 5 mins with max A in prep for ADLs Additional ADL Goal #3: Pt will demonstrate sustained attention during simple ADLs with Min cues Additional ADL Goal #4: Pt will perform bed mobility with Mod A +2 in preparation for ADLs  Plan Discharge plan remains appropriate    Co-evaluation    PT/OT/SLP Co-Evaluation/Treatment: Yes Reason for Co-Treatment: For patient/therapist safety;To address functional/ADL transfers PT goals addressed during session: Mobility/safety with mobility;Balance;Strengthening/ROM OT goals addressed during session: ADL's and self-care      AM-PAC OT "6 Clicks" Daily Activity     Outcome Measure   Help from another person eating meals?: Total Help from another person taking care of personal grooming?: Total Help from another person toileting, which includes using toliet, bedpan, or urinal?: Total Help from another person bathing (including washing, rinsing, drying)?: Total Help from another person to put on and taking off regular upper body clothing?: Total Help from another person to put on and taking off regular lower body clothing?: Total 6 Click Score: 6    End of Session  Equipment Utilized During Treatment: Oxygen(vent )  OT Visit Diagnosis: Unsteadiness on feet (R26.81);Cognitive communication deficit (R41.841);Hemiplegia and hemiparesis Symptoms and signs involving cognitive functions: Cerebral infarction Hemiplegia - Right/Left: Right Hemiplegia - dominant/non-dominant: Dominant Hemiplegia - caused by: Cerebral infarction   Activity Tolerance Patient limited by fatigue   Patient Left in bed;with call bell/phone within reach;with restraints reapplied   Nurse Communication Mobility status        Time: 2353-6144 OT Time Calculation (min): 25 min  Charges: OT General Charges $OT Visit: 1 Visit OT Treatments $Self Care/Home Management : 8-22 mins  Rashaad Hallstrom MSOT, OTR/L Acute Rehab Pager: [phone] Office: [place] 07/10/2019, 10:43 AM

## 2019-07-10 NOTE — Progress Notes (Signed)
Physical Therapy Treatment Patient Details Name: Lee Hooper MRN: [id number] DOB: [birth year] Today's Date: 07/10/2019    History of Present Illness This 49 y.o. male admitted 4/28 with Rt sided hemiplegia and aphasia.  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  Repeat CT following procedure showed moderate basal ganglia hemorrhage vs. stroke   Repeat MRI revealed that the ICA and MCA re-occluded with change in FLAIR sequence excluding him from repeat thrombectomy.  He underwent a decompressive hemicraniectomy.  He waws intubated 4/28 and remains intubated.  He developed an ileus 5/2.  PMH includes: COPD, tobacco abuse, psoriasis    PT Comments    PT returns to provide family education at request of Dr. Vassie Loll. Pt's spouse present and receptive to education on PROM for RLE. PT also reinforces education on having patient attend to R side and R visual field, sitting on R side and communicating with patient from R side if able. Pt tolerates PROM well, with only one coughing bout just prior to initiating exercise. PT also provides education on proper body mechanics during PROM to improve caregiver safety. Pt will continue to benefit from acute PT POC and mobilization as tolerated to improve mobility and reduce caregiver burden.  Follow Up Recommendations  SNF     Equipment Recommendations  Wheelchair (measurements PT);Wheelchair cushion (measurements PT);Hospital bed    Recommendations for Other Services       Precautions / Restrictions Precautions Precautions: Fall;Other (comment) Precaution Comments: Rt hemiplegia, likely aphasia  Restrictions Weight Bearing Restrictions: No    Mobility  Bed Mobility Overal bed mobility: Needs Assistance Bed Mobility: Rolling Rolling: Total assist;+2 for physical assistance         General bed mobility comments: bed egress to bed in chair mode, pt requires totalA to shift shoulders side to side to maintain upright  positioning  Transfers                 General transfer comment: unable to attempt   Ambulation/Gait                 Stairs             Wheelchair Mobility    Modified Rankin (Stroke Patients Only) Modified Rankin (Stroke Patients Only) Pre-Morbid Rankin Score: No symptoms Modified Rankin: Severe disability     Balance Overall balance assessment: Needs assistance Sitting-balance support: Bilateral upper extremity supported;Feet supported(bed in chair mode) Sitting balance-Leahy Scale: Zero Sitting balance - Comments: right lean with BUE support, PT assisting R arm to provide WB Postural control: Right lateral lean                                  Cognition Arousal/Alertness: Lethargic Behavior During Therapy: Flat affect Overall Cognitive Status: Difficult to assess                                 General Comments: see earlier session      Exercises Other Exercises Other Exercises: PROM R ankle PF/DF 10 repetitions and one 30 seconds hold Other Exercises: PROM R knee flexion/extension 5 repetitions Other Exercises: PROM R hip abduction/adduction x5 reps Other Exercises: RUE PROM including composite finger flexion/extension, elbow flexion/extension, and shoulder flexion/abduction/internal and external rotation for 5 repetitions    General Comments General comments (skin integrity, edema, etc.): pt with increase in RR  up to 51 with bout of coughing, does calm with return of RR into 20s, otherwise VSS      Pertinent Vitals/Pain Pain Assessment: Faces Faces Pain Scale: Hurts little more Pain Location: generalized with coughing Pain Descriptors / Indicators: Grimacing Pain Intervention(s): Monitored during session    Home Living                      Prior Function            PT Goals (current goals can now be found in the care plan section) Acute Rehab PT Goals Patient Stated Goal: pt unable to  state Progress towards PT goals: Progressing toward goals(slowly)    Frequency    Min 2X/week      PT Plan Current plan remains appropriate    Co-evaluation PT/OT/SLP Co-Evaluation/Treatment: Yes Reason for Co-Treatment: For patient/therapist safety;To address functional/ADL transfers PT goals addressed during session: Mobility/safety with mobility;Balance;Strengthening/ROM OT goals addressed during session: ADL's and self-care      AM-PAC PT "6 Clicks" Mobility   Outcome Measure  Help needed turning from your back to your side while in a flat bed without using bedrails?: Total Help needed moving from lying on your back to sitting on the side of a flat bed without using bedrails?: Total Help needed moving to and from a bed to a chair (including a wheelchair)?: Total Help needed standing up from a chair using your arms (e.g., wheelchair or bedside chair)?: Total Help needed to walk in hospital room?: Total Help needed climbing 3-5 steps with a railing? : Total 6 Click Score: 6    End of Session Equipment Utilized During Treatment: Oxygen Activity Tolerance: Patient tolerated treatment well Patient left: in bed;with call bell/phone within reach;with bed alarm set;with family/visitor present Nurse Communication: Mobility status;Need for lift equipment PT Visit Diagnosis: Difficulty in walking, not elsewhere classified (R26.2);Hemiplegia and hemiparesis Hemiplegia - Right/Left: Right Hemiplegia - dominant/non-dominant: Dominant Hemiplegia - caused by: Nontraumatic intracerebral hemorrhage     Time: 1610-9604 PT Time Calculation (min) (ACUTE ONLY): 16 min  Charges:  $Therapeutic Exercise: 8-22 mins                     Zenaida Niece, PT, DPT Acute Rehabilitation Pager: [phone]    Zenaida Niece 07/10/2019, 12:57 PM

## 2019-07-10 NOTE — Progress Notes (Signed)
Physical Therapy Treatment Patient Details Name: Lee Hooper MRN: [id number] DOB: 1970/06/20 Today's Date: 07/10/2019    History of Present Illness This 49 y.o. male admitted 4/28 with Rt sided hemiplegia and aphasia.  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  Repeat CT following procedure showed moderate basal ganglia hemorrhage vs. stroke   Repeat MRI revealed that the ICA and MCA re-occluded with change in FLAIR sequence excluding him from repeat thrombectomy.  He underwent a decompressive hemicraniectomy.  He waws intubated 4/28 and remains intubated.  He developed an ileus 5/2.  PMH includes: COPD, tobacco abuse, psoriasis    PT Comments    Pt tolerates transition to bed in chair mode well with stable vital signs. Pt requires physical assistance for all functional mobility with R lateral lean noted during this session. Pt does intermittently follow motor commands with L side and to open eyes but requires max cues including verbal, tactile, and visual. Pt will continue to benefit from acute PT POC to improve functional mobility, activity tolerance, and to reduce caregiver burden.   Follow Up Recommendations  SNF(vs LTACH pending medical needs)     Equipment Recommendations  Wheelchair (measurements PT);Wheelchair cushion (measurements PT);Hospital bed(mechanical lift)    Recommendations for Other Services       Precautions / Restrictions Precautions Precautions: Fall;Other (comment) Precaution Comments: Rt hemiplegia, likely aphasia  Restrictions Weight Bearing Restrictions: No    Mobility  Bed Mobility Overal bed mobility: Needs Assistance Bed Mobility: Rolling Rolling: Total assist;+2 for physical assistance         General bed mobility comments: bed egress to bed in chair mode, pt requires totalA to shift shoulders side to side to maintain upright positioning  Transfers                    Ambulation/Gait                 Stairs              Wheelchair Mobility    Modified Rankin (Stroke Patients Only) Modified Rankin (Stroke Patients Only) Pre-Morbid Rankin Score: No symptoms Modified Rankin: Severe disability     Balance Overall balance assessment: Needs assistance Sitting-balance support: Bilateral upper extremity supported;Feet supported(bed in chair mode) Sitting balance-Leahy Scale: Zero Sitting balance - Comments: right lean with BUE support, PT assisting R arm to provide WB Postural control: Right lateral lean                                  Cognition Arousal/Alertness: Awake/alert;Lethargic(lethargic but awakens to command) Behavior During Therapy: Flat affect Overall Cognitive Status: Difficult to assess                                 General Comments: pt intermittently follows verbal commands for mobility with left side and to open eyes, no motor commands for R side. Pt requires max verbal cues and intermittent tactile cues to follow commands with L side      Exercises Other Exercises Other Exercises: PROM to RLE including ankle PF/DF, knee flexion/extension, and hip abduction/adduction for 10 repetitions each Other Exercises: PROM shoulder flexion with eccentric AAROM lowering Other Exercises: AAROM elbow flexion and extension, requires max cues, 15 repetitions Other Exercises: L knee SAQ in bed in chair position, 6 repetitions AROM  General Comments General comments (skin integrity, edema, etc.): pt VSS, pt intubated on vent with 40% FiO2, PEEP 5, RR consistently in mid 20s during activity. Pt BP stable when changing position to bed in chair mode.      Pertinent Vitals/Pain Pain Assessment: Faces Faces Pain Scale: Hurts even more Pain Location: neck Pain Descriptors / Indicators: Grimacing Pain Intervention(s): Monitored during session    Home Living                      Prior Function            PT Goals (current goals can now be  found in the care plan section) Acute Rehab PT Goals Patient Stated Goal: pt unable to state Progress towards PT goals: Progressing toward goals(slowly)    Frequency    Min 2X/week      PT Plan Current plan remains appropriate    Co-evaluation PT/OT/SLP Co-Evaluation/Treatment: Yes Reason for Co-Treatment: Complexity of the patient's impairments (multi-system involvement);Necessary to address cognition/behavior during functional activity;For patient/therapist safety PT goals addressed during session: Mobility/safety with mobility;Balance;Strengthening/ROM        AM-PAC PT "6 Clicks" Mobility   Outcome Measure  Help needed turning from your back to your side while in a flat bed without using bedrails?: Total Help needed moving from lying on your back to sitting on the side of a flat bed without using bedrails?: Total Help needed moving to and from a bed to a chair (including a wheelchair)?: Total Help needed standing up from a chair using your arms (e.g., wheelchair or bedside chair)?: Total Help needed to walk in hospital room?: Total Help needed climbing 3-5 steps with a railing? : Total 6 Click Score: 6    End of Session Equipment Utilized During Treatment: Oxygen Activity Tolerance: Patient tolerated treatment well Patient left: in bed;with call bell/phone within reach;with bed alarm set Nurse Communication: Mobility status;Need for lift equipment PT Visit Diagnosis: Difficulty in walking, not elsewhere classified (R26.2);Hemiplegia and hemiparesis Hemiplegia - Right/Left: Right Hemiplegia - dominant/non-dominant: Dominant Hemiplegia - caused by: Nontraumatic intracerebral hemorrhage     Time: 4401-0272 PT Time Calculation (min) (ACUTE ONLY): 26 min  Charges:  $Therapeutic Exercise: 8-22 mins                     Arlyss Gandy, PT, DPT Acute Rehabilitation Pager: [phone]    Arlyss Gandy 07/10/2019, 10:33 AM

## 2019-07-10 NOTE — Progress Notes (Signed)
NAME:  Lee Hooper, MRN:  [id number], DOB:  November 30, 1970, LOS: 9 ADMISSION DATE:  07/01/2019, CONSULTATION DATE:  07/01/19 REFERRING MD:  Aroor MD, CHIEF COMPLAINT:  CVA    Brief History   Lee Hooper is a 49 yo M who presented as a code stroke after being found aphasic and nonverbal by his wife. CT/CTA studies showed left ICA/MCA thrombosis and pt was taken for IR thrombectomy for left ICA and left M1 occlusions. Repeat head CT was obtained following the procedure out of concern for possible hemorrhage and showed moderate basal ganglia hemorrhage vs stroke. Repeat MRI revealed that the ICA and MCA re-occluded with change in FLAIR sequence excluding him from repeat thrombectomy. He underwent a L hemicraniectomy. He was intubated for airway protection. PCCM consulted for vent management.  Past Medical History  Psoriasis  COPD  Tobacco Use  Significant Hospital Events   4/28: Admit; L ICA/MCA Thrombectomy with re-occlusion --> left hemicraniectomy, intubated for acute hypoxic respiratory failure   Consults:  IR PCCM  Neuro (Primary) Neurosurgery  Procedures:  4/28 IR - L ICA/MCA Thrombectomy 4/28 L hemicraniectomy 4/28 Intubation  Significant Diagnostic Tests:  4/28 Admit CT Head Code Stroke:  Hyperdense left ICA and MCA compatible with acute thrombus. No acute infarct or hemorrhage.  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 The patient has minimal atherosclerotic disease. The left internal carotid artery is occluded through the terminus and extending in the left M1 M2 and M3 branches. There is poor collateral circulation on the left. There is a large territory infarct involving the left hemisphere. Infarct volume 266 mL.  4/28 Admit MR Brain:  Restricted diffusion throughout much of the left MCA vascular territory consistent with acute ischemia. Restricted diffusion consistent  with acute ischemia is also present within the paramedian left frontoparietal lobes, although this is less well assessed due to the degree of motion degradation at the level of the vertex. There is little if any corresponding T2/FLAIR hyperintensity at these sites. No significant mass effect. No midline shift.  4/28 Post Thrombectomy CT Head: : 1. 3.0 x 2.6 x 3.9 cm region of hyperdensity centered within the left basal ganglia, left subinsular region and inferomedial left temporal lobe likely reflecting a combination of parenchymal hematoma and contrast staining.  2. Edema with loss of gray-white differentiation within the left basal ganglia, left insula and anterior left temporal lobe, likely acute infarction. Subtle changes of acute infarction are also suspected within the paramedian left frontal lobe ACA vascular territory. 3. Scattered small-volume subarachnoid hemorrhage along the left cerebral hemisphere. 4. Regional mass effect with effacement of the left lateral ventricle temporal horn. No midline shift.  4/28 Post Thrombectomy MR Brain:  1. Restricted diffusion throughout the majority of the left MCA and ACA vascular territories consistent with acute ischemia. 2. No significant mass effect at this time. No midline shift. 3. The acute parenchymal hemorrhage and/or contrast staining centered within left basal ganglia and inferomedial left temporal lobe on prior head CT does not appear significantly changed. Based on the MR appearance, it is suspected that a significant component of this previously demonstrated hyperdensity reflects contrast staining. 4. Redemonstrated small volume subarachnoid hemorrhage overlying the left cerebral hemisphere. Small volume subarachnoid hemorrhage is also questioned along the right cerebral hemisphere posteriorly.  4/29 Post Hemicraniectomy CT head:  Complete left ACA/MCA territory infarction with swollen brain bulging through craniectomy defect. No midline  shift or entrapment. Petechial hemorrhage at left basal ganglia. Extraaxial hemorrhage along surface of the infarct.   4/30 CXR: increasing opacification of R lower lung field. (personal interpretation)  5/1 Head CT > Slightly increased rightward midline shift with a small amount of herniation beneath the anterior falx. Otherwise unchanged Examination.  5/2 abdominal Xray: Air-filled distended colonic loop in the right lower quadrant measuring 12.5 cm likely the cecum and likely due to ileus. Recommend serial follow-up abdominal films.  5/4 abdominal Xray:  Bowel dilatation with increase in small bowel dilatation and slightly less dilatation of the cecum compared to 2 days prior. Suspect ileus as most likely etiology given this overall appearance.  5/5 abdominal Xray:  Stable appearance of an adynamic ileus. No convincing evidence of free air, pneumatosis or significant interval change.  5/6 CXR: Endotracheal and feeding tubes are noted in grossly good position. Mild right basilar atelectasis or infiltrate is noted with small pleural effusion.  5/6 Head CT: Left ACA and MCA territory infarct with unchanged degree of subarachnoid hemorrhage. Swelling has progressed with greater herniation through the craniectomy defect and 5 mm of midline shift.  Micro Data:  4/28: SARS-CoV-2/Influenza A/Influenza B PCR: Negative  4/30: Respiratory culture: Abundant Moraxella Catarrhalis, Haemophilus Influenzae, beta lactamase positive  5/2: Urine culture: NGTD  5/2: BC x2: NGTD  Antimicrobials:  4/30 - 5/2: Cefepime 5/2 - 5/4: Augmentin  5/5 - Present: Unasyn  Interim history/subjective:  5/3: NAE. Was taken off precedex yesterday and pt opened his eyes. Prednisone 50 mg qd was begun. He had a persistent fever around 38.2, HR 110s, BP 140s/60s. Pt required increased O2 with secretions; CXR showed mild edema with small effusions c/f aspiration vs opacification. Blood culture x2 and urine culture  collected, NGTD. ABG pending. Currently satting 92% on PRVC FiO2 80, PEEP 10, Plateau 22. WBC declined to 10.5 from 11.1, Na 151 from 156. Tube feeds were held d/t distended colonic loop in RLQ on KUB, to be continued today. Thrombocytopenia improved to 93 from 81.   5/4: Patient remains intubated with spontaneous movement of left arm and leg. Abdomen continues to be distended with KUB concerning for ileus; however, he continues to experience episodes of diarrhea. Tube feedings are currently on hold. Sodium 154 this am (goal 155-160). Continue IVF. ABG yesterday: pH 7.4, pCO2 42, pO2 106, bicarb 26.5. Satting 91-95% on PRVC FiO2 60, PEEP 10, plateau 20.  Blood culture x2 and urine culture collected, NGTD.WBC pending today.  5/5: NAE. Pt remains intubated on PRN fetanyl. Will open eyes and spontaneously move left leg and arm. Abdomen remains distended with decreased bowel sounds. Continues to have liquid stool in rectal pouch. Tube feedings remain on hold. Sodium 150 this am. IVF @ 75 mL/hr. Satting 90-100% on PRVC 620 mL, FiO2 50% with PEEP 10, plateau 20. Blood culture NGTD.  5/6: NAE overnight. Intubated with no sedation. Pt is opening his eyes and moving left foot and hand to command this morning. Pt had bowel movement overnight with 400 cc of stool per nurse. Abdomen less distended and soft this am. Will restart trickle tube feeds and d/c D5NS. Sodium 151 this am. Satting 92% on PSV;CPAP FiO2 40% with PEEP 5 and pressure support 5. Blood culture NGTD.   5/7: NAE. Average response to 40 mg Lasix UOP 900 (net +470). Completed Unasyn yesterday. VSS, spO2 remains in low 90s the majority of the time on 620/40%/+5. Morning labs significant for HCO3 of 19. Otherwise unremarkable.   Objective  Blood pressure 126/72, pulse 88, temperature 99.6 F (37.6 C), temperature source Axillary, resp. rate 16, height 6' (1.829 m), weight 108.5 kg, SpO2 92 %.    Vent Mode: PSV;CPAP FiO2 (%):  [40 %-60 %] 40 % Set  Rate:  [16 bmp] 16 bmp Vt Set:  [620 mL] 620 mL PEEP:  [5 cmH20] 5 cmH20 Pressure Support:  [5 cmH20] 5 cmH20 Plateau Pressure:  [14 cmH20-18 cmH20] 18 cmH20   Intake/Output Summary (Last 24 hours) at 07/10/2019 1002 Last data filed at 07/10/2019 0800 Gross per 24 hour  Intake 863.64 ml  Output 1225 ml  Net -361.36 ml   Filed Weights   07/08/19 0427 07/09/19 0436 07/10/19 0500  Weight: 112 kg 109.2 kg 108.5 kg    Examination: Gen: Ill appearing in NAD, intubated and on ventilation. HEENT: ETT in place, MMM.   Neck: no cervical lymphadenopathy, no JVD Heart: RRR, S1, S2, no M/R/G, no chest wall tenderness Lungs: bilateral mechanical breath sounds, no crackles or wheezes Abdomen: Normoactive bowel sounds, soft abdomen, mild distension, no rebound/guarding Extremities: no clubbing, cyanosis, ++ edema: pulses are +2 in bilateral upper and lower extremities Neuro: Will open eyes and move left foot and hand to command. PRN sedation Skin:  No rashes,+ multiple scaling plaques noted on flexor surfaces and abdomen  Resolved Hospital Problem list   none  Assessment & Plan:  49 yo with h/o COPD and tobacco use presented with large L MCA stroke s/p IR thrombectomy and subsequent hemorrhage s/p hemicraniectomy.   Acute hypoxic respiratory failure requiring mechanical ventilation: Pt was intubated on 4/28 for acute respiratory failure. Likely related to his CVA. MRI brain 5/1 revealed slightly increased rightward midline shiftwith a small amount ofherniation beneath the anterior falx. New consolidation on CXR on 4/30 highly c/f HCAP, antibiotics completed as below. Continues to sat in low 90s on 620/40%/+5. Wife would like to pursue tracheostomy. At this point, we are considering if a trial of extubation prior is reasonable. He is not ready currently for extubation; we will reassess in the coming days. Still edematous and clinically volume up; we will repeat Lasix today.  - Lasix 40mg  IV Once  -  Continue Vent support - PNA coverage completed  - VAP protocol in place - Wean PEEP and FiO2 for sats greater than 90%. - SBTs  - Tracheostomy likely in future per wife; considering trial of extubation   Pneumonia, resolved: New consolidation on 4/30 highly c/f CAP vs HCAP. Cultures grew beta-lactamase positive Moraxella catarrhalis and Haemophilus influenzae. Initially on cefepime now narrowed to Augmentin. Pt still has persistent low grade fever to 38C; antibiotics completed on 5/6 and Repeat CXR revealed mild right basilar atelectasis or infiltrate is noted with small pleural effusion. At this point, his pneumonia is resolved, no need for continued treatment.   Wheezing c/f COPD exacerbation, resolved : Pt has had persistent wheezing throughout his hospitalization. Prednisone started on 5/2 with c/f COPD exacerbation. Wheezing improved and prednisone last dose 5/5. At this point, this problem is resolved.   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 Repeat head CT was obtained following the procedure out of concern for possible hemorrhage and showed moderate basal ganglia hemorrhage vs stroke. Repeat MRI revealed that the ICA and MCA re-occluded with change in FLAIR sequence excluding him from repeat thrombectomy. Neurology primary  team and managing. Now following commands and has progressively improving neuro status.  - Management per neurology  - Maintain neuro protective measures; goal for eurothermia, euglycemia, normoxia - Nutrition and bowel regimen  - Seizure precautions  - AEDs per neurology - Allow for hypernatremia, Na goal 155-160  Ileus: Pt began to have abdominal distension and decreased bowel sounds on 5/2; KUB showed distended colonic loop in RLQ, likely at the cecum. Tube feeds were held. Pt  continues to have anal leakage. F/u KUB revealed bowel dilatation with increase in small bowel dilatation and slightly less dilatation of the cecum compared to 2 days prior. KUB on 5/5 revealed no interval improvement of ileus. Pt has since had good stool output in Flexiseal; now a bit acidotic to HCO3 19 likely 2/2 diarrhea. Ileus is improving.  - Flexiseal - Reglan expired and discontinued - On Miralax, PRN Dulcolax, PRN Senna  - tf at trickle rate today - K+ 3.6, Will replete K+.  - BMP, Mg, phos daily   FEN: Pt has hypernatremia throughout hospitalization, goal 155-160 per neurology. Also has had hypokalemia, most recent K 3.6. Cortrak placed and tolerating feeds well.  - Management per neurology  - Follow Na q6hrs  - Supplement K as needed; will replete today.  Best practice:  Diet: Tube Feeds  Pain/Anxiety/Delirium protocol (if indicated): minimize, fentanyl prn VAP protocol (if indicated):per protocol DVT prophylaxis: SCDs, heparin GI prophylaxis: Protonix Glucose control: WNL Mobility: Bedrest Code Status: FULL Family Communication: per primary team  Disposition: ICU    Ivin Poot, MS4

## 2019-07-11 ENCOUNTER — Inpatient Hospital Stay (HOSPITAL_COMMUNITY): Payer: Medicaid Other

## 2019-07-11 DIAGNOSIS — R509 Fever, unspecified: Secondary | ICD-10-CM

## 2019-07-11 DIAGNOSIS — J9600 Acute respiratory failure, unspecified whether with hypoxia or hypercapnia: Secondary | ICD-10-CM

## 2019-07-11 DIAGNOSIS — I63132 Cerebral infarction due to embolism of left carotid artery: Secondary | ICD-10-CM

## 2019-07-11 LAB — BASIC METABOLIC PANEL
Anion gap: 10 (ref 5–15)
BUN: 22 mg/dL — ABNORMAL HIGH (ref 6–20)
CO2: 27 mmol/L (ref 22–32)
Calcium: 8.8 mg/dL — ABNORMAL LOW (ref 8.9–10.3)
Chloride: 114 mmol/L — ABNORMAL HIGH (ref 98–111)
Creatinine, Ser: 1.12 mg/dL (ref 0.61–1.24)
GFR calc Af Amer: >60 mL/min (ref >60)
GFR calc non Af Amer: >60 mL/min (ref >60)
Glucose, Bld: 116 mg/dL — ABNORMAL HIGH (ref 70–99)
Potassium: 3.5 mmol/L (ref 3.5–5.1)
Sodium: 151 mmol/L — ABNORMAL HIGH (ref 135–145)

## 2019-07-11 LAB — GLUCOSE, CAPILLARY
Glucose-Capillary: 112 mg/dL — ABNORMAL HIGH (ref 70–99)
Glucose-Capillary: 113 mg/dL — ABNORMAL HIGH (ref 70–99)
Glucose-Capillary: 122 mg/dL — ABNORMAL HIGH (ref 70–99)
Glucose-Capillary: 123 mg/dL — ABNORMAL HIGH (ref 70–99)
Glucose-Capillary: 127 mg/dL — ABNORMAL HIGH (ref 70–99)
Glucose-Capillary: 132 mg/dL — ABNORMAL HIGH (ref 70–99)

## 2019-07-11 LAB — CBC
HCT: 41.8 % (ref 39.0–52.0)
Hemoglobin: 13.3 g/dL (ref 13.0–17.0)
MCH: 29.2 pg (ref 26.0–34.0)
MCHC: 31.8 g/dL (ref 30.0–36.0)
MCV: 91.9 fL (ref 80.0–100.0)
Platelets: 136 10*3/uL — ABNORMAL LOW (ref 150–400)
RBC: 4.55 MIL/uL (ref 4.22–5.81)
RDW: 14.1 % (ref 11.5–15.5)
WBC: 9.6 10*3/uL (ref 4.0–10.5)
nRBC: 0 % (ref 0.0–0.2)

## 2019-07-11 LAB — MAGNESIUM: Magnesium: 2.6 mg/dL — ABNORMAL HIGH (ref 1.7–2.4)

## 2019-07-11 LAB — PHOSPHORUS: Phosphorus: 3 mg/dL (ref 2.5–4.6)

## 2019-07-11 MED ORDER — POTASSIUM CHLORIDE 20 MEQ/15ML (10%) PO SOLN
40.0000 meq | Freq: Three times a day (TID) | ORAL | Status: AC
  Administered 2019-07-11 (×3): 40 meq
  Filled 2019-07-11 (×3): qty 30

## 2019-07-11 MED ORDER — FENTANYL CITRATE (PF) 100 MCG/2ML IJ SOLN
200.0000 ug | Freq: Once | INTRAMUSCULAR | Status: AC
  Administered 2019-07-11: 150 ug via INTRAVENOUS
  Filled 2019-07-11: qty 4

## 2019-07-11 MED ORDER — METOPROLOL TARTRATE 5 MG/5ML IV SOLN
2.5000 mg | INTRAVENOUS | Status: DC | PRN
  Administered 2019-07-11 (×2): 2.5 mg via INTRAVENOUS
  Administered 2019-07-11 – 2019-07-28 (×12): 5 mg via INTRAVENOUS
  Administered 2019-07-30: 2.5 mg via INTRAVENOUS
  Administered 2019-08-03 – 2019-08-04 (×2): 5 mg via INTRAVENOUS
  Administered 2019-08-11: 2.5 mg via INTRAVENOUS
  Administered 2019-08-19 – 2019-08-28 (×19): 5 mg via INTRAVENOUS
  Filled 2019-07-11 (×38): qty 5

## 2019-07-11 MED ORDER — PROPOFOL 10 MG/ML IV BOLUS: 500.0000 mg | Freq: Once | INTRAVENOUS | Status: DC

## 2019-07-11 MED ORDER — MIDAZOLAM HCL 2 MG/2ML IJ SOLN
5.0000 mg | Freq: Once | INTRAMUSCULAR | Status: AC
  Administered 2019-07-11: 4 mg via INTRAVENOUS
  Filled 2019-07-11: qty 6

## 2019-07-11 MED ORDER — PROPOFOL 500 MG/50ML IV EMUL
INTRAVENOUS | Status: AC
  Filled 2019-07-11: qty 50

## 2019-07-11 MED ORDER — ETOMIDATE 2 MG/ML IV SOLN
40.0000 mg | Freq: Once | INTRAVENOUS | Status: AC
  Administered 2019-07-11: 20 mg via INTRAVENOUS
  Filled 2019-07-11: qty 20

## 2019-07-11 MED ORDER — ETOMIDATE 2 MG/ML IV SOLN
INTRAVENOUS | Status: AC
  Filled 2019-07-11: qty 10

## 2019-07-11 MED ORDER — VECURONIUM BROMIDE 10 MG IV SOLR
10.0000 mg | Freq: Once | INTRAVENOUS | Status: AC
  Administered 2019-07-11: 10 mg via INTRAVENOUS
  Filled 2019-07-11: qty 10

## 2019-07-11 NOTE — Progress Notes (Signed)
STROKE TEAM PROGRESS NOTE   INTERVAL HISTORY The family at the bedside.  The patient continues to have severe neurological deficit status post thrombectomy and decompressive craniectomy.   Vitals:   07/11/19 0322 07/11/19 0400 07/11/19 0500 07/11/19 0600  BP:  (!) 102/59 102/62 107/66  Pulse: (!) 102 100 100 (!) 101  Resp: 20 19 20 20   Temp:  99.6 F (37.6 C)    TempSrc:  Oral    SpO2: 95% 95% 95% 95%  Weight:      Height:       CBC:  Recent Labs  Lab 07/10/19 0525 07/11/19 0528  WBC 12.6* 9.6  HGB 13.6 13.3  HCT 41.6 41.8  MCV 90.6 91.9  PLT 118* 136*   Basic Metabolic Panel:  Recent Labs  Lab 07/10/19 0525 07/11/19 0528  NA 147* 151*  K 4.4 3.5  CL 116* 114*  CO2 19* 27  GLUCOSE 104* 116*  BUN 19 22*  CREATININE 0.91 1.12  CALCIUM 8.6* 8.8*  MG 2.5* 2.6*  PHOS 3.2 3.0   Lipid Panel:     Component Value Date/Time   CHOL 92 07/02/2019 0431   TRIG 137 07/03/2019 0500   HDL 30 (L) 07/02/2019 0431   CHOLHDL 3.1 07/02/2019 0431   VLDL 17 07/02/2019 0431   LDLCALC 45 07/02/2019 0431   HgbA1c:  Lab Results  Component Value Date   HGBA1C 5.5 07/02/2019   Urine Drug Screen:     Component Value Date/Time   LABOPIA NONE DETECTED 07/01/2019 1930   COCAINSCRNUR NONE DETECTED 07/01/2019 1930   LABBENZ POSITIVE (A) 07/01/2019 1930   AMPHETMU NONE DETECTED 07/01/2019 1930   THCU NONE DETECTED 07/01/2019 1930   LABBARB NONE DETECTED 07/01/2019 1930    Alcohol Level     Component Value Date/Time   ETH <10 07/01/2019 0759    IMAGING past 24 hours No results found.  PHYSICAL EXAM   Temp:  [99 F (37.2 C)-102.7 F (39.3 C)] 99.6 F (37.6 C) (05/08 0400) Pulse Rate:  [85-121] 101 (05/08 0600) Resp:  [10-33] 20 (05/08 0600) BP: (102-157)/(59-84) 107/66 (05/08 0600) SpO2:  [90 %-99 %] 95 % (05/08 0600) FiO2 (%):  [40 %-50 %] 50 % (05/08 0322)  General - Well nourished, well developed, intubated off sedation.   Ophthalmologic - fundi not visualized  due to noncooperation.  Cardiovascular - Regular rate and rhythm.  Neuro - intubated off sedation, eyes closed but able to open on repeated voice stimulation, not following commands. With eye opening, eyes left gaze preference position, not blinking to visual threat bilaterally, doll's eyes present, PERRL. Corneal reflex weak on the right and brisk on the left, gag and cough present. Breathing over the vent.  Facial symmetry not able to test due to ET tube.  Tongue protrusion not cooperative.  Possibly intermittent following commands left hand.  Spontaneous and purposeful movement left UE 2/5 and LE 2/5. However, right UE and LE plegic. DTR 1+ and no babinski. Sensation, coordination and gait not tested.    ASSESSMENT/PLAN Lee Hooper is a 49 y.o. male with history of COPD, ongoing tobacco use, and psoriasis, presenting with dense LMCA syndrome. Attempt for Left ICA and Left M1 thrombectomy was done emergently with TICI 2b flow. Unfortunately, the pt had post procedure hemorrhage and reocclusion of the vessels. F/u imaging suggested early edema and pt underwent prophylactic decompressive hemicraniectomy.  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  Stroke etiology unknown  Code Stroke CT head showed early hypodensity in nearly entire LMCA. ASPECTS 6-7 per notes.  CTA head & neck occlusion cervical ICA on the left. This is most likely due to dissection. The patient has minimal atherosclerotic disease.  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  There is poor collateral circulation on the left.   CTP CBF large core volume of 266 mL, perfusion volume 313 mL, mismatch volume 47 mL.   ASPECTS using CTP by Dr. Chestine Spore 6 or 7.   IR - TICI2c revascularization  MRI  Restricted diffusion throughout the majority of the left MCA and ACA vascular territories consistent with acute ischemia.  MRA - reocclusion of the intracranial left internal carotid artery. No flow related signal is seen within the left middle cerebral artery.  CT Head 07/04/19 - Slightly increased rightward midline shift with a small amount of herniation beneath the anterior falx. Otherwise unchanged examination.  CT repeat 5/6 stable infarcts and SAH but increasing edema w/ midline shift now 35mm and herniation through crani site  2D Echo - EF 55 - 60%. No cardiac source of emboli identified.   LDL 45  HgbA1c 5.5  subq heparin for VTE prophylaxis  No antithrombotic prior to admission, now on none d/t hemorrhagic conversion  Therapy recommendations:  SNF  Disposition:  pending  Respiratory failure  Intubated on vent  CCM on board  CXR 07/03/19- New consolidation in the right lower lobe and left infrahilar region concerning for pneumonia. Possible small layering right effusion.  CXR 5/3 mild pulm edema w/ atx vs infiltrate/aspiration  On cefepime 4/30>>5/2 for pneumonia  Augmentin 5/2>>5/4  Unasyn 5/5>>5.6 lasix  Likely need trach and PEG over the weekend.  Wife agreed.    It appears that an attempt for weaning is being done today.  COPD exacerbation, resolved  on prednisone, ended 5/5  CCM on board  Cerebral edema  MRI  Restricted diffusion throughout the majority of the left MCA and ACA vascular territories consistent with acute ischemia.  CT Head 07/04/19 - Slightly increased rightward midline shift with a small amount of herniation beneath the anterior falx. Otherwise unchanged examination.  S/p [facility] 4/28  CT repeat 5/6 stable infarcts and SAH but increasing edema w/ midline shift now 40mm and herniation through crani site  On keppra. Plan 2 wk course per NSG  Na 151->147  Off  3%. On D5NS @ 29  Okay to allow Na gradually normalize  Fever  HCAP, resolved  Tmax 99.9->102.7->99.6  Leukocytosis - 12.4->12.6->12.6->9.6  CXR 5/3 - Suspected mild pulmonary edema with small layering effusions and associated bibasilar opacities, atelectasis versus infiltrate/aspiration.  UA neg  Sputum moraxella, H. flu   On cefepime 4/30>>5/2  Augmentin 5/2>>5/4, Unasyn 5/5>>5/6  Blood culture 5/2 NGTD   Blood cultures 5/7 - pending  Resp cultures - pending  Currently off all antibiotics  Hypertension  Home meds:  None  Current meds: Normodyne prn ; Levophed prn  Stable - 107/66  SBP goal < 160 due to hemorrhagic conversion . Long-term BP goal normotensive  Dysphagia   NPO  On TF @ 20 due to illeus  On D5NS  Coretrak 5/7 in preparation of [employer]  on board  PEG likely  Ileus, improved  abd distension w/ decreased BS  KUB 5/2 distended colonic loop  TF on hold -> trickle feeds-> again stopped->trickle feeds @ 20  Miralax   Flexiseal following laxatives  On reglan, laxatives on hold, decrease fentanyl    Coretrak 5/7  K 4.2->3.3->3.9->3.5->4.4 - supplement - keep K > 4.0  CCM on board  Tobacco abuse  Current smoker  Smoking cessation counseling will be provided if able  Other Stroke Risk Factors  Obesity, Body mass index is 32.44 kg/m., recommend weight loss, diet and exercise as appropriate   Likely undiagnosed obstructive sleep apnea based on body habitus  Other Active Problems  Code Status - Full code  K 4.2->3.3->3.9->3.5->4.4 - supplement - keep K > 4.0->3.5 - supplement  Thrombocytopenia - 166->51->61->68->88->93->92->106->118->136 - monitor - improving  Agitation. Wrist restraint  Hospital day # 10  Critically ill patient status post thrombectomy and decompressive craniectomy.  Case is discussed at length with the family at the bedside.  Critical care time 30 minutes.   To contact Stroke Continuity  provider, please refer to [email or web address] After hours, contact General Neurology

## 2019-07-11 NOTE — Progress Notes (Signed)
RT note: Assisted DR Mannam with bedside tracheostomy. Patient was pre oxygenated. Patient tolerated procedure well.

## 2019-07-11 NOTE — Progress Notes (Signed)
Subjective: NAEs o/n  Objective: Vital signs in last 24 hours: Temp:  [99 F (37.2 C)-102.7 F (39.3 C)] 99.6 F (37.6 C) (05/08 0400) Pulse Rate:  [97-121] 105 (05/08 0800) Resp:  [16-33] 32 (05/08 0800) BP: (102-157)/(59-84) 108/68 (05/08 0800) SpO2:  [90 %-99 %] 94 % (05/08 0800) FiO2 (%):  [40 %-50 %] 40 % (05/08 0801)  Intake/Output from previous day: 05/07 0701 - 05/08 0700 In: 738.4 [I.V.:18.4; NG/GT:720] Out: 3400 [Urine:2150; Stool:1250] Intake/Output this shift: Total I/O In: 40 [NG/GT:40] Out: -   Eyes open to stim, follows commands on L, plegic on right Incision c/d, flap full  Lab Results: Recent Labs    07/10/19 0525 07/11/19 0528  WBC 12.6* 9.6  HGB 13.6 13.3  HCT 41.6 41.8  PLT 118* 136*   BMET Recent Labs    07/10/19 0525 07/11/19 0528  NA 147* 151*  K 4.4 3.5  CL 116* 114*  CO2 19* 27  GLUCOSE 104* 116*  BUN 19 22*  CREATININE 0.91 1.12  CALCIUM 8.6* 8.8*    Studies/Results: No results found.  Assessment/Plan: S/p hemicraniectomy for stroke - continue supportive care   Lee Hooper 07/11/2019, 9:48 AM

## 2019-07-11 NOTE — Progress Notes (Signed)
Spoke w/ N surg: recommended IVC filter.   Simonne Martinet ACNP-BC [facility] Pager # [phone] OR # [phone] if no answer

## 2019-07-11 NOTE — Procedures (Signed)
Procedure: Percutaneous Tracheostomy CPT 31600 Performed by: Dr. Chilton Greathouse, Dr. Myrla Halsted Bronchoscopy Assistant: Anders Simmonds  Indications: Chronic respiratory failure and need for ongoing mechanical ventilation.  Consent: Signed in chart  Preprocedure: Universal protocol was followed for this procedure. Timeout performed. Anterior neck prepped and draped.  Anesthesia: The patient was intubated and sedated prior to the procedure. Additional midazolam, etomidate, fentanyl and vecuronium were given for sedation and paralysis with close attention to vital signs throughout procedure.   Procedure: The patient was placed in the supine position. The anterior neck was prepped and draped in usual sterile fashion. 1% lidocaine was administered approximately 2 fingerbreadths above the sternal notch for local anesthesia. A 1.5-cm vertical incision was then performed 2 fingerbreadths above the sternal notch. Using a curved Kelly, blunt dissection was performed down to the level of the pretracheal fascia. At this point, the bronchoscope was introduced through the endotracheal tube and the trachea was properly visualized. The endotracheal tube was then gradually withdrawn within the trachea under direct bronchoscopic visualization. Proper midline position was confirmed by bouncing the needle from the tracheostomy tray over the trachea with bronchoscopic examination. The needle was advanced into the trachea and proper positioning was confirmed with direct visualization. The needle was then removed leaving a white outer cannula in position. The wire from the tracheostomy tray was then advanced through the white outer cannula. The cannula was then removed. The small, blue dilator was then advanced over the wire into the trachea. Once proper dilatation was achieved, the dilator was removed. The large, tapered dilator was then advanced over the wire into the trachea. The dilator was removed leaving the wire and white  inner cannula in position. A number 6 percutaneous Shiley tracheostomy tube was then advanced over the wire and white inner cannula into the trachea. Proper positioning was confirmed with bronchoscopic visualization. The tracheostomy tube was then sutured in place with four nylon sutures. It was further secured with a tracheostomy tie.  Estimated blood loss: Less than 5 mL.  Complications: None immediate.  CXR ordered.   Chilton Greathouse MD [facility] Please see [email or web address] for pager details.  07/11/2019, 3:20 PM

## 2019-07-11 NOTE — Progress Notes (Addendum)
I assumed care of pt at 1900. Pts wife at bedside during shift change. He opened eyes and moved left side-toes/fingers on command. Pt was bathed tonight. FMS and condom catheter in place. peri care and CHG bath completed. Pt has been repositioned throughout the night. He is clean and dry at this time. Will continue to monitor.

## 2019-07-11 NOTE — Progress Notes (Signed)
Lower extremity venous has been completed.   Preliminary results in CV Proc.   Blanch Media 07/11/2019 2:18 PM

## 2019-07-11 NOTE — Progress Notes (Signed)
NAME:  DEVONTAY CELAYA, MRN:  [id number], DOB:  05-19-1970, LOS: 10 ADMISSION DATE:  07/01/2019, CONSULTATION DATE:  07/01/19 REFERRING MD:  Aroor MD, CHIEF COMPLAINT:  CVA    Brief History   Lee Hooper is a 49 yo M who presented as a code stroke after being found aphasic and nonverbal by his wife. CT/CTA studies showed left ICA/MCA thrombosis and pt was taken for IR thrombectomy for left ICA and left M1 occlusions. Repeat head CT was obtained following the procedure out of concern for possible hemorrhage and showed moderate basal ganglia hemorrhage vs stroke. Repeat MRI revealed that the ICA and MCA re-occluded with change in FLAIR sequence excluding him from repeat thrombectomy. He underwent a L hemicraniectomy. He was intubated for airway protection. PCCM consulted for vent management.  Past Medical History  Psoriasis  COPD  Tobacco Use  Significant Hospital Events   4/28: Admit; L ICA/MCA Thrombectomy with re-occlusion --> left hemicraniectomy, intubated for acute hypoxic respiratory failure  5/3: NAE. Was taken off precedex yesterday and pt opened his eyes. Prednisone 50 mg qd was begun. He had a persistent fever around 38.2, HR 110s, BP 140s/60s. Pt required increased O2 with secretions; CXR showed mild edema with small effusions c/f aspiration vs opacification. Blood culture x2 and urine culture collected, NGTD. ABG pending. Currently satting 92% on PRVC FiO2 80, PEEP 10, Plateau 22. WBC declined to 10.5 from 11.1, Na 151 from 156. Tube feeds were held d/t distended colonic loop in RLQ on KUB, to be continued today. Thrombocytopenia improved to 93 from 81.  5/4: Patient remains intubated with spontaneous movement of left arm and leg.  5/5: NAE. Pt remains intubated on PRN fetanyl. Will open eyes and spontaneously move left leg and arm.  5/6: NAE overnight. Intubated with no sedation. 5/7: NAE. Marland Kitchen Completed Unasyn yesterday. VSS, spO2 remains in low 90s the majority of the time on  620/40%/+5. Morning labs significant for HCO3 of 19. Otherwise unremarkable.   Consults:  IR PCCM  Neuro (Primary) Neurosurgery  Procedures:  4/28 IR - L ICA/MCA Thrombectomy 4/28 L hemicraniectomy 4/28 Intubation  Significant Diagnostic Tests:  4/28 Admit CT Head Code Stroke:  Hyperdense left ICA and MCA compatible with acute thrombus. No acute infarct or hemorrhage.  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 The patient has minimal atherosclerotic disease. The left internal carotid artery is occluded through the terminus and extending in the left M1 M2 and M3 branches. There is poor collateral circulation on the left. There is a large territory infarct involving the left hemisphere. Infarct volume 266 mL.  4/28 Admit MR Brain:  Restricted diffusion throughout much of the left MCA vascular territory consistent with acute ischemia. Restricted diffusion consistent with acute ischemia is also present within the paramedian left frontoparietal lobes, although this is less well assessed due to the degree of motion degradation at the level of the vertex. There is little if any corresponding T2/FLAIR hyperintensity at these sites. No significant mass effect. No midline shift.  4/28 Post Thrombectomy CT Head: : 1. 3.0 x 2.6 x 3.9 cm region of hyperdensity centered within the left basal ganglia, left subinsular region and inferomedial left temporal lobe likely reflecting a combination of parenchymal hematoma and contrast staining.  2. Edema with loss of gray-white differentiation within the left basal ganglia, left insula and anterior left temporal lobe, likely acute infarction. Subtle changes of  acute infarction are also suspected within the paramedian left frontal lobe ACA vascular territory. 3. Scattered small-volume subarachnoid hemorrhage along the left cerebral hemisphere. 4. Regional mass  effect with effacement of the left lateral ventricle temporal horn. No midline shift.  4/28 Post Thrombectomy MR Brain:  1. Restricted diffusion throughout the majority of the left MCA and ACA vascular territories consistent with acute ischemia. 2. No significant mass effect at this time. No midline shift. 3. The acute parenchymal hemorrhage and/or contrast staining centered within left basal ganglia and inferomedial left temporal lobe on prior head CT does not appear significantly changed. Based on the MR appearance, it is suspected that a significant component of this previously demonstrated hyperdensity reflects contrast staining. 4. Redemonstrated small volume subarachnoid hemorrhage overlying the left cerebral hemisphere. Small volume subarachnoid hemorrhage is also questioned along the right cerebral hemisphere posteriorly.  4/29 Post Hemicraniectomy CT head:  Complete left ACA/MCA territory infarction with swollen brain bulging through craniectomy defect. No midline shift or entrapment. Petechial hemorrhage at left basal ganglia. Extraaxial hemorrhage along surface of the infarct.   4/30 CXR: increasing opacification of R lower lung field. (personal interpretation)  5/1 Head CT > Slightly increased rightward midline shift with a small amount of herniation beneath the anterior falx. Otherwise unchanged Examination.  5/6 Head CT: Left ACA and MCA territory infarct with unchanged degree of subarachnoid hemorrhage. Swelling has progressed with greater herniation through the craniectomy defect and 5 mm of midline shift.  Micro Data:  4/28: SARS-CoV-2/Influenza A/Influenza B PCR: Negative  4/30: Respiratory culture: Abundant Moraxella Catarrhalis, Haemophilus Influenzae, beta lactamase positive  5/2: Urine culture: NGTD  5/2: BC x2: NGTD  Antimicrobials:  4/30 - 5/2: Cefepime 5/2 - 5/4: Augmentin  5/5 - Present: Unasyn  Interim history/subjective:    Objective   Blood pressure  108/68, pulse (Abnormal) 105, temperature 99.6 F (37.6 C), temperature source Oral, resp. rate (Abnormal) 32, height 6' (1.829 m), weight 108.5 kg, SpO2 94 %.    Vent Mode: PRVC FiO2 (%):  [40 %-50 %] 40 % Set Rate:  [16 bmp] 16 bmp Vt Set:  [620 mL] 620 mL PEEP:  [5 cmH20] 5 cmH20 Pressure Support:  [5 cmH20-12 cmH20] 12 cmH20 Plateau Pressure:  [18 cmH20-20 cmH20] 20 cmH20   Intake/Output Summary (Last 24 hours) at 07/11/2019 7371 Last data filed at 07/11/2019 0800 Gross per 24 hour  Intake 480 ml  Output 3125 ml  Net -2645 ml   Filed Weights   07/08/19 0427 07/09/19 0436 07/10/19 0500  Weight: 112 kg 109.2 kg 108.5 kg    Examination: General: This is a 49 year old white male is currently on full ventilatory support he is in no acute distress HEENT left craniotomy sutures intact orally intubated no JVD Pulmonary: Clear to auscultation without accessory use currently Cardiac: Regular rate and rhythm without murmur rub or gallop Abdomen: Soft nontender no organomegaly Neuro: Awake, right-sided hemiparesis, follows commands on left, opens eyes GU clear yellow Resolved Hospital Problem list   treated HCAP with Moraxella and Haemophilus influenza, these were beta-lactamase positive, antibiotics were completed 5/6 COPD exacerbation Ileus Assessment & Plan:  49 yo with h/o COPD and tobacco use presented with large L MCA stroke s/p IR thrombectomy and subsequent hemorrhage s/p hemicraniectomy.     Large Left ICA/MCA CVA s/p IR thrombectomy and left decompressive hemicraniectomy  significant cerebral edema: Repeat head CT was obtained following the procedure out of concern for possible hemorrhage and showed moderate basal ganglia hemorrhage vs stroke.  Repeat MRI revealed that the ICA and MCA re-occluded with change in FLAIR sequence excluding him from repeat thrombectomy. Neurology primary team and managing. Now following commands and has progressively improving neuro status.   Plan Cont serial neuro checks Maintain euthymia, euglycemia, Avoid infection Seizure precautions with AEDs per neurology Okay to allow sodium to normalize gradually per stroke team  Acute hypoxic respiratory failure requiring mechanical ventilation Underlying possible COPD Currently on full ventilatory support Plan Continue full vent support for now Proceed with tracheostomy either later today or tomorrow VAP bundle A.m. chest x-ray  Fever, without leukocytosis Plan Follow-up blood culture sent 5/7 Follow-up urine culture , and repeat respiratory culture We will check lower extremity ultrasounds   Best practice:  Diet: Tube Feeds  Pain/Anxiety/Delirium protocol (if indicated): minimize, fentanyl prn VAP protocol (if indicated):per protocol DVT prophylaxis: SCDs, heparin GI prophylaxis: Protonix Glucose control: WNL Mobility: Bedrest Code Status: FULL Family Communication: per primary team  Disposition: ICU   Critical care time  32 minutes  Erick Colace ACNP-BC [place] Pager # [phone] OR # [phone] if no answer

## 2019-07-12 ENCOUNTER — Inpatient Hospital Stay (HOSPITAL_COMMUNITY): Payer: Medicaid Other

## 2019-07-12 HISTORY — PX: IR IVC FILTER PLMT / S&I /IMG GUID/MOD SED: IMG701

## 2019-07-12 LAB — GLUCOSE, CAPILLARY
Glucose-Capillary: 113 mg/dL — ABNORMAL HIGH (ref 70–99)
Glucose-Capillary: 118 mg/dL — ABNORMAL HIGH (ref 70–99)
Glucose-Capillary: 122 mg/dL — ABNORMAL HIGH (ref 70–99)
Glucose-Capillary: 124 mg/dL — ABNORMAL HIGH (ref 70–99)
Glucose-Capillary: 142 mg/dL — ABNORMAL HIGH (ref 70–99)

## 2019-07-12 LAB — COMPREHENSIVE METABOLIC PANEL
ALT: 159 U/L — ABNORMAL HIGH (ref 0–44)
AST: 47 U/L — ABNORMAL HIGH (ref 15–41)
Albumin: 2.1 g/dL — ABNORMAL LOW (ref 3.5–5.0)
Alkaline Phosphatase: 67 U/L (ref 38–126)
Anion gap: 10 (ref 5–15)
BUN: 29 mg/dL — ABNORMAL HIGH (ref 6–20)
CO2: 25 mmol/L (ref 22–32)
Calcium: 8.9 mg/dL (ref 8.9–10.3)
Chloride: 120 mmol/L — ABNORMAL HIGH (ref 98–111)
Creatinine, Ser: 1.26 mg/dL — ABNORMAL HIGH (ref 0.61–1.24)
GFR calc Af Amer: >60 mL/min (ref >60)
GFR calc non Af Amer: >60 mL/min (ref >60)
Glucose, Bld: 139 mg/dL — ABNORMAL HIGH (ref 70–99)
Potassium: 3.4 mmol/L — ABNORMAL LOW (ref 3.5–5.1)
Sodium: 155 mmol/L — ABNORMAL HIGH (ref 135–145)
Total Bilirubin: 0.7 mg/dL (ref 0.3–1.2)
Total Protein: 6.4 g/dL — ABNORMAL LOW (ref 6.5–8.1)

## 2019-07-12 LAB — CULTURE, RESPIRATORY W GRAM STAIN: Gram Stain: NONE SEEN

## 2019-07-12 LAB — CBC
HCT: 44.5 % (ref 39.0–52.0)
Hemoglobin: 13.7 g/dL (ref 13.0–17.0)
MCH: 28.7 pg (ref 26.0–34.0)
MCHC: 30.8 g/dL (ref 30.0–36.0)
MCV: 93.1 fL (ref 80.0–100.0)
Platelets: 155 10*3/uL (ref 150–400)
RBC: 4.78 MIL/uL (ref 4.22–5.81)
RDW: 14.5 % (ref 11.5–15.5)
WBC: 9 10*3/uL (ref 4.0–10.5)
nRBC: 0 % (ref 0.0–0.2)

## 2019-07-12 LAB — PHOSPHORUS: Phosphorus: 3.2 mg/dL (ref 2.5–4.6)

## 2019-07-12 MED ORDER — IOHEXOL 300 MG/ML  SOLN
100.0000 mL | Freq: Once | INTRAMUSCULAR | Status: AC | PRN
  Administered 2019-07-12: 60 mL via INTRAVENOUS

## 2019-07-12 MED ORDER — IOHEXOL 300 MG/ML  SOLN: 100.0000 mL | Freq: Once | INTRAMUSCULAR | Status: DC | PRN

## 2019-07-12 MED ORDER — MIDAZOLAM HCL 2 MG/2ML IJ SOLN
INTRAMUSCULAR | Status: AC
  Filled 2019-07-12: qty 2

## 2019-07-12 MED ORDER — LACTATED RINGERS IV SOLN: INTRAVENOUS | Status: AC

## 2019-07-12 MED ORDER — FREE WATER
200.0000 mL | Freq: Four times a day (QID) | Status: DC
  Administered 2019-07-12 – 2019-07-13 (×4): 200 mL

## 2019-07-12 MED ORDER — MIDAZOLAM HCL 2 MG/2ML IJ SOLN
INTRAMUSCULAR | Status: AC | PRN
  Administered 2019-07-12: 1 mg via INTRAVENOUS

## 2019-07-12 MED ORDER — FENTANYL CITRATE (PF) 100 MCG/2ML IJ SOLN
INTRAMUSCULAR | Status: AC | PRN
  Administered 2019-07-12: 50 ug via INTRAVENOUS

## 2019-07-12 MED ORDER — LIDOCAINE HCL 1 % IJ SOLN
INTRAMUSCULAR | Status: AC
  Filled 2019-07-12: qty 20

## 2019-07-12 MED ORDER — FENTANYL CITRATE (PF) 100 MCG/2ML IJ SOLN
INTRAMUSCULAR | Status: AC
  Filled 2019-07-12: qty 2

## 2019-07-12 NOTE — Progress Notes (Addendum)
Referring Physician(s): CCM; Dr Vaughan Browner  Supervising Physician: Jacqulynn Cadet  Patient Status:  [facility] - In-pt  Chief Complaint:  Bilat LE DVT--- cannot anticoagulate   Subjective:  Hx Code stroke with L MCA revascularization 07/01/19 Hemicraniectomy Now with development of BLE DVT Cannot anticoagulate secondary recent surgery  RIGHT: - Findings consistent with acute deep vein thrombosis involving the right common femoral vein, SF junction, right femoral vein, right proximal profunda vein, right popliteal vein, right posterior tibial veins, and right peroneal veins. - No cystic structure found in the popliteal fossa. LEFT: - Findings consistent with acute deep vein thrombosis involving the left common femoral vein, SF junction, left femoral vein, and left proximal profunda vein. - No cystic structure found in the popliteal fossa.  Request made for Retrievable IVC filter placement Dr Laurence Ferrari has approved procedure  Allergies: Patient has no known allergies.  Medications: Prior to Admission medications   Medication Sig Start Date End Date Taking? Authorizing Provider  Caffeine-Magnesium Salicylate (DIUREX PO) Take 1 tablet by mouth daily as needed (fluid).   Yes [provider]  naproxen sodium (ALEVE) 220 MG tablet Take 220 mg by mouth 2 (two) times daily.   Yes [provider]     Vital Signs: BP 115/72   Pulse (!) 104   Temp 99.8 F (37.7 C) (Axillary)   Resp (!) 23   Ht 6' (1.829 m)   Wt 239 lb 3.2 oz (108.5 kg)   SpO2 95%   BMI 32.44 kg/m   Intubated and on vent No response  Imaging: CT HEAD WO CONTRAST  Result Date: 07/09/2019 CLINICAL DATA:  Stroke follow-up EXAM: CT HEAD WITHOUT CONTRAST TECHNIQUE: Contiguous axial images were obtained from the base of the skull through the vertex without intravenous contrast. COMPARISON:  Five days ago FINDINGS: Brain: Cytotoxic edema throughout the entire left MCA and ACA distributions with  bulging through the craniectomy defect. There is also unchanged infarct at the level of the left thalamus. There is subarachnoid hemorrhage and cortical petechial hemorrhage. No measurable hematoma. Midline shift measures 5 mm. No entrapment or contralateral infarct. Vascular: Dense left distal ICA and MCA. Skull: Left craniectomy flap. Sinuses/Orbits: Negative orbits and sinuses IMPRESSION: Left ACA and MCA territory infarct with unchanged degree of subarachnoid hemorrhage. Swelling has progressed with greater herniation through the craniectomy defect and 5 mm of midline shift. Electronically Signed   By: Monte Fantasia M.D.   On: 07/09/2019 08:09   DG CHEST PORT 1 VIEW  Result Date: 07/11/2019 CLINICAL DATA:  Acute respiratory failure EXAM: PORTABLE CHEST 1 VIEW COMPARISON:  Jul 09, 2019 FINDINGS: The tracheostomy tube terminates above the carina. There are pockets of subcutaneous gas in the neck which are likely related to interval tracheostomy tube placement. The enteric tube extends below the left hemidiaphragm. There is no pneumothorax. The lung volumes are slightly low. Hazy bibasilar airspace opacities are again noted which are similar to prior study. There is a probable small right-sided pleural effusion. There is no definite acute osseous abnormality. There is a lucency projecting over the upper abdomen which is likely artifactual. IMPRESSION: 1. Lines and tubes as above with interval placement of a tracheostomy tube. 2. No pneumothorax. 3. Persistent bibasilar airspace opacities. Probable small right-sided pleural effusion. Electronically Signed   By: Constance Holster M.D.   On: 07/11/2019 16:01   DG Chest Port 1 View  Result Date: 07/09/2019 CLINICAL DATA:  Acute respiratory failure. EXAM: PORTABLE CHEST 1 VIEW COMPARISON:  Jul 06, 2019. FINDINGS: The heart size and mediastinal contours are within normal limits. Endotracheal and feeding tubes are noted in grossly good position. Left lung is clear.  No pneumothorax is noted. Mild right basilar atelectasis or infiltrate is noted with small pleural effusion. The visualized skeletal structures are unremarkable. IMPRESSION: Endotracheal and feeding tubes are noted in grossly good position. Mild right basilar atelectasis or infiltrate is noted with small pleural effusion. Electronically Signed   By: Lupita Raider M.D.   On: 07/09/2019 07:50   VAS [place] LOWER EXTREMITY VENOUS (DVT)  Result Date: 07/12/2019  Lower Venous DVTStudy Indications: Fever, and stroke.  Comparison Study: no prior Performing Technologist: Blanch Media RVS  Examination Guidelines: A complete evaluation includes B-mode imaging, spectral Doppler, color Doppler, and power Doppler as needed of all accessible portions of each vessel. Bilateral testing is considered an integral part of a complete examination. Limited examinations for reoccurring indications may be performed as noted. The reflux portion of the exam is performed with the patient in reverse Trendelenburg.  +---------+---------------+---------+-----------+----------+--------------+ RIGHT    CompressibilityPhasicitySpontaneityPropertiesThrombus Aging +---------+---------------+---------+-----------+----------+--------------+ CFV      None           No       No                   Acute          +---------+---------------+---------+-----------+----------+--------------+ SFJ      None                                         Acute          +---------+---------------+---------+-----------+----------+--------------+ FV Prox  None                                         Acute          +---------+---------------+---------+-----------+----------+--------------+ FV Mid   Full                                                        +---------+---------------+---------+-----------+----------+--------------+ FV DistalFull                                                         +---------+---------------+---------+-----------+----------+--------------+ PFV      None                                         Acute          +---------+---------------+---------+-----------+----------+--------------+ POP      None           No       No                   Acute          +---------+---------------+---------+-----------+----------+--------------+ PTV      None  Acute          +---------+---------------+---------+-----------+----------+--------------+ PERO     None                                         Acute          +---------+---------------+---------+-----------+----------+--------------+ EIV                                                   Not visualized +---------+---------------+---------+-----------+----------+--------------+ IIV                                                   Not visualized +---------+---------------+---------+-----------+----------+--------------+   +---------+---------------+---------+-----------+----------+--------------+ LEFT     CompressibilityPhasicitySpontaneityPropertiesThrombus Aging +---------+---------------+---------+-----------+----------+--------------+ CFV      None           No       No                   Acute          +---------+---------------+---------+-----------+----------+--------------+ SFJ      None                                         Acute          +---------+---------------+---------+-----------+----------+--------------+ FV Prox  None                                         Acute          +---------+---------------+---------+-----------+----------+--------------+ FV Mid   Full                                                        +---------+---------------+---------+-----------+----------+--------------+ FV DistalFull                                                         +---------+---------------+---------+-----------+----------+--------------+ PFV      Partial                                      Acute          +---------+---------------+---------+-----------+----------+--------------+ POP      Full           Yes      Yes                                 +---------+---------------+---------+-----------+----------+--------------+ PTV      Full                                                        +---------+---------------+---------+-----------+----------+--------------+  PERO     Full                                                        +---------+---------------+---------+-----------+----------+--------------+ EIV                                                   Not visualized +---------+---------------+---------+-----------+----------+--------------+ IIV                                                   Not visualized +---------+---------------+---------+-----------+----------+--------------+     Summary: RIGHT: - Findings consistent with acute deep vein thrombosis involving the right common femoral vein, SF junction, right femoral vein, right proximal profunda vein, right popliteal vein, right posterior tibial veins, and right peroneal veins. - No cystic structure found in the popliteal fossa.  LEFT: - Findings consistent with acute deep vein thrombosis involving the left common femoral vein, SF junction, left femoral vein, and left proximal profunda vein. - No cystic structure found in the popliteal fossa.  *See table(s) above for measurements and observations. Electronically signed by Waverly Ferrari MD on 07/12/2019 at 7:22:15 AM.    Final     Labs:  CBC: Recent Labs    07/09/19 7989 07/10/19 0525 07/11/19 0528 07/12/19 0613  WBC 12.6* 12.6* 9.6 9.0  HGB 13.4 13.6 13.3 13.7  HCT 42.0 41.6 41.8 44.5  PLT 106* 118* 136* 155    COAGS: Recent Labs    07/01/19 0759 07/01/19 1821  INR 1.0 1.4*  APTT 26 49*     BMP: Recent Labs    07/09/19 0642 07/10/19 0525 07/11/19 0528 07/12/19 0613  NA 151* 147* 151* 155*  K 3.5 4.4 3.5 3.4*  CL 116* 116* 114* 120*  CO2 27 19* 27 25  GLUCOSE 117* 104* 116* 139*  BUN 17 19 22* 29*  CALCIUM 8.7* 8.6* 8.8* 8.9  CREATININE 1.09 0.91 1.12 1.26*  GFRNONAA >60 >60 >60 >60  GFRAA >60 >60 >60 >60    LIVER FUNCTION TESTS: Recent Labs    07/04/19 0500 07/05/19 0413 07/06/19 0422 07/12/19 0613  BILITOT 0.8 0.7 1.2 0.7  AST 27 28 37 47*  ALT 29 30 32 159*  ALKPHOS 36* 40 40 67  PROT 5.4* 5.6* 5.5* 6.4*  ALBUMIN 2.6* 2.5* 2.3* 2.1*    Assessment and Plan:  Scheduled now for Retrievable inferior vena cava filter placement procedure in IR asap Risks and benefits discussed with the patient's wife [place] via phone including, but not limited to bleeding, infection, contrast induced renal failure, filter fracture or migration which can lead to emergency surgery or even death, strut penetration with damage or irritation to adjacent structures and caval thrombosis.  All questions were answered, she is agreeable to proceed. Consent signed and in chart.   Electronically Signed: Robet Leu, PA-C 07/12/2019, 7:40 AM   I spent a total of 15 Minutes at the the patient's bedside AND on the patient's hospital floor or unit, greater than 50% of which was counseling/coordinating care for retrievable  IVC filter

## 2019-07-12 NOTE — Progress Notes (Signed)
Upon RT arrival to pt room, pt found to be on CPAP/PS 18/8 and 50%. VT's over . Pt turned down to 12/8. RT will continue to monitor.

## 2019-07-12 NOTE — Procedures (Signed)
Interventional Radiology Procedure Note  Procedure: Placement of infrarenal IVC filter.   Complications: None  Estimated Blood Loss: None  Recommendations: - Will follow pt for potential filter retrieval   Signed,  Sterling Big, MD

## 2019-07-12 NOTE — Progress Notes (Signed)
Pt transported to IR via ventilator. No complications noted.

## 2019-07-12 NOTE — Progress Notes (Signed)
NAME:  Lee Hooper, MRN:  [id number], DOB:  1970-03-22, LOS: 50 ADMISSION DATE:  07/01/2019, CONSULTATION DATE:  07/01/19 REFERRING MD:  Aroor MD, CHIEF COMPLAINT:  CVA    Brief History   Lee Hooper is a 49 yo M who presented as a code stroke after being found aphasic and nonverbal by his wife. CT/CTA studies showed left ICA/MCA thrombosis and pt was taken for IR thrombectomy for left ICA and left M1 occlusions. Repeat head CT was obtained following the procedure out of concern for possible hemorrhage and showed moderate basal ganglia hemorrhage vs stroke. Repeat MRI revealed that the ICA and MCA re-occluded with change in FLAIR sequence excluding him from repeat thrombectomy. He underwent a L hemicraniectomy. He was intubated for airway protection. PCCM consulted for vent management.  Past Medical History  Psoriasis  COPD  Tobacco Use  Significant Hospital Events   4/28: Admit; L ICA/MCA Thrombectomy with re-occlusion --> left hemicraniectomy, intubated for acute hypoxic respiratory failure  5/3: NAE. Was taken off precedex yesterday and pt opened his eyes. Prednisone 50 mg qd was begun. He had a persistent fever around 38.2, HR 110s, BP 140s/60s. Pt required increased O2 with secretions; CXR showed mild edema with small effusions c/f aspiration vs opacification. Blood culture x2 and urine culture collected, NGTD. ABG pending. Currently satting 92% on PRVC FiO2 80, PEEP 10, Plateau 22. WBC declined to 10.5 from 11.1, Na 151 from 156. Tube feeds were held d/t distended colonic loop in RLQ on KUB, to be continued today. Thrombocytopenia improved to 93 from 81.  5/4: Patient remains intubated with spontaneous movement of left arm and leg.  5/5: NAE. Pt remains intubated on PRN fetanyl. Will open eyes and spontaneously move left leg and arm.  5/6: NAE overnight. Intubated with no sedation. 5/7: NAE. Marland Kitchen Completed Unasyn yesterday. VSS, spO2 remains in low 90s the majority of the time on  620/40%/+5. Morning labs significant for HCO3 of 19. Otherwise unremarkable.  5/8: Ongoing fever.  Obtained ultrasound, positive for lower extremity DVT bilaterally.  Tracheostomy placed at bedside.,  Did have some SVT this responded to treating fever and rate control. 5/9: IVC filter placed Consults:  IR PCCM  Neuro (Primary) Neurosurgery  Procedures:  4/28 IR - L ICA/MCA Thrombectomy 4/28 L hemicraniectomy 4/28 Intubation  Significant Diagnostic Tests:  4/28 Admit CT Head Code Stroke:  Hyperdense left ICA and MCA compatible with acute thrombus. No acute infarct or hemorrhage.  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 The patient has minimal atherosclerotic disease. The left internal carotid artery is occluded through the terminus and extending in the left M1 M2 and M3 branches. There is poor collateral circulation on the left. There is a large territory infarct involving the left hemisphere. Infarct volume 266 mL.  4/28 Admit MR Brain:  Restricted diffusion throughout much of the left MCA vascular territory consistent with acute ischemia. Restricted diffusion consistent with acute ischemia is also present within the paramedian left frontoparietal lobes, although this is less well assessed due to the degree of motion degradation at the level of the vertex. There is little if any corresponding T2/FLAIR hyperintensity at these sites. No significant mass effect. No midline shift.  4/28 Post Thrombectomy CT Head: : 1. 3.0 x 2.6 x 3.9 cm region of hyperdensity centered within the left basal ganglia, left subinsular region and inferomedial left temporal lobe likely reflecting a  combination of parenchymal hematoma and contrast staining.  2. Edema with loss of gray-white differentiation within the left basal ganglia, left insula and anterior left temporal lobe, likely acute infarction. Subtle  changes of acute infarction are also suspected within the paramedian left frontal lobe ACA vascular territory. 3. Scattered small-volume subarachnoid hemorrhage along the left cerebral hemisphere. 4. Regional mass effect with effacement of the left lateral ventricle temporal horn. No midline shift.  4/28 Post Thrombectomy MR Brain:  1. Restricted diffusion throughout the majority of the left MCA and ACA vascular territories consistent with acute ischemia. 2. No significant mass effect at this time. No midline shift. 3. The acute parenchymal hemorrhage and/or contrast staining centered within left basal ganglia and inferomedial left temporal lobe on prior head CT does not appear significantly changed. Based on the MR appearance, it is suspected that a significant component of this previously demonstrated hyperdensity reflects contrast staining. 4. Redemonstrated small volume subarachnoid hemorrhage overlying the left cerebral hemisphere. Small volume subarachnoid hemorrhage is also questioned along the right cerebral hemisphere posteriorly.  4/29 Post Hemicraniectomy CT head:  Complete left ACA/MCA territory infarction with swollen brain bulging through craniectomy defect. No midline shift or entrapment. Petechial hemorrhage at left basal ganglia. Extraaxial hemorrhage along surface of the infarct.   4/30 CXR: increasing opacification of R lower lung field. (personal interpretation)  5/1 Head CT > Slightly increased rightward midline shift with a small amount of herniation beneath the anterior falx. Otherwise unchanged Examination.  5/6 Head CT: Left ACA and MCA territory infarct with unchanged degree of subarachnoid hemorrhage. Swelling has progressed with greater herniation through the craniectomy defect and 5 mm of midline shift. 5/8: Bilateral acute DVT involving the right common femoral vein right proximal profundal vein and right popliteal vein also left DVT involving same  vessels Micro Data:  4/28: SARS-CoV-2/Influenza A/Influenza B PCR: Negative  4/30: Respiratory culture: Abundant Moraxella Catarrhalis, Haemophilus Influenzae, beta lactamase positive  5/2: Urine culture: NGTD  5/2: BC x2: NGTD  Antimicrobials:  4/30 - 5/2: Cefepime 5/2 - 5/4: Augmentin  5/5 - Present: Unasyn, stopped  Interim history/subjective:  Fever curve improving. Still tachycardic but improved Objective   Blood pressure 121/79, pulse (Abnormal) 118, temperature 99.8 F (37.7 C), temperature source Axillary, resp. rate 20, height 6' (1.829 m), weight 108.5 kg, SpO2 97 %.    Vent Mode: PRVC FiO2 (%):  [40 %-100 %] 80 % Set Rate:  [16 bmp] 16 bmp Vt Set:  [620 mL] 620 mL PEEP:  [5 cmH20] 5 cmH20 Plateau Pressure:  [15 cmH20-16 cmH20] 15 cmH20   Intake/Output Summary (Last 24 hours) at 07/12/2019 1008 Last data filed at 07/12/2019 0500 Gross per 24 hour  Intake 322.67 ml  Output 3835 ml  Net -3512.33 ml   Filed Weights   07/08/19 0427 07/09/19 0436 07/10/19 0500  Weight: 112 kg 109.2 kg 108.5 kg    Examination: General: 49 year old white male currently resting in bed in no acute distress HEENT craniotomy sutures intact.  Now with size 6 tracheostomy, unremarkable. Pulmonary: Clear to auscultation Cardiac: Regular rate, tachycardic, no murmur rub or gallop Abdomen soft nontender, large volume of liquid stool from Flexi-Seal Neuro: Awake, follows commands, persistent right-sided weakness GU: Clear yellow Extremities: Warm dry brisk cap refill  Resolved Hospital Problem list   treated HCAP with Moraxella and Haemophilus influenza, these were beta-lactamase positive, antibiotics were completed 5/6 COPD exacerbation Ileus Assessment & Plan:  49 yo with h/o COPD and tobacco use presented with large  L MCA stroke s/p IR thrombectomy and subsequent hemorrhage s/p hemicraniectomy.     Large Left ICA/MCA CVA s/p IR thrombectomy and left decompressive hemicraniectomy   significant cerebral edema: Repeat head CT was obtained following the procedure out of concern for possible hemorrhage and showed moderate basal ganglia hemorrhage vs stroke. Repeat MRI revealed that the ICA and MCA re-occluded with change in FLAIR sequence excluding him from repeat thrombectomy. Neurology primary team and managing. Now following commands and has progressively improving neuro status.  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  Suspect this was secondary to mild derecruiting, this is improved and currently we are weaning oxygen Currently on full ventilatory support Portable chest x-ray personally reviewed: Rotated film.  Tracheostomy in satisfactory position.  Aeration actually improving with mild basilar atelectasis. Plan Wean supplemental oxygen Routine tracheostomy care Continue bronchodilators VAP bundle Daily assessment for weaning once FiO2 less than 50%  Acute bilateral lower extremity DVT Status post retractable IVC filter 5/9  Plan  Continue supportive care  Follow-up per interventional radiology   SVT, rate now sinus tach Plan As needed metoprolol Treat fevers Telemetry monitoring  Fever, without leukocytosis suspect this is secondary to thromboembolic disease Plan Follow-up pending cultures Trend fever curve  Ileus: Resolving.   Plan Advance diet. Best practice:  Diet: Tube Feeds  Pain/Anxiety/Delirium protocol (if indicated): minimize, fentanyl prn VAP protocol (if indicated):per protocol DVT prophylaxis: SCDs, heparin GI prophylaxis: Protonix Glucose control: WNL Mobility: Bedrest Code Status: FULL Family Communication: per primary team  Disposition: ICU   Critical care time 31 minutes  Simonne Martinet ACNP-BC [facility] Pulmonary/Critical  Care Pager # [phone] OR # [phone] if no answer

## 2019-07-12 NOTE — Progress Notes (Signed)
STROKE TEAM PROGRESS NOTE   INTERVAL HISTORY The family at the bedside.  The patient continues to have severe neurological deficit status post thrombectomy and decompressive craniectomy.  Still intubated.   Vitals:   07/12/19 0430 07/12/19 0500 07/12/19 0530 07/12/19 0600  BP: 113/76 108/73 105/73 109/76  Pulse: (!) 113 (!) 110 (!) 108 (!) 107  Resp: (!) 24 (!) 23 (!) 22 20  Temp:      TempSrc:      SpO2: 94% 94% 95% 95%  Weight:      Height:       CBC:  Recent Labs  Lab 07/11/19 0528 07/12/19 0613  WBC 9.6 9.0  HGB 13.3 13.7  HCT 41.8 44.5  MCV 91.9 93.1  PLT 136* 155   Basic Metabolic Panel:  Recent Labs  Lab 07/10/19 0525 07/11/19 0528  NA 147* 151*  K 4.4 3.5  CL 116* 114*  CO2 19* 27  GLUCOSE 104* 116*  BUN 19 22*  CREATININE 0.91 1.12  CALCIUM 8.6* 8.8*  MG 2.5* 2.6*  PHOS 3.2 3.0   Lipid Panel:     Component Value Date/Time   CHOL 92 07/02/2019 0431   TRIG 137 07/03/2019 0500   HDL 30 (L) 07/02/2019 0431   CHOLHDL 3.1 07/02/2019 0431   VLDL 17 07/02/2019 0431   LDLCALC 45 07/02/2019 0431   HgbA1c:  Lab Results  Component Value Date   HGBA1C 5.5 07/02/2019   Urine Drug Screen:     Component Value Date/Time   LABOPIA NONE DETECTED 07/01/2019 1930   COCAINSCRNUR NONE DETECTED 07/01/2019 1930   LABBENZ POSITIVE (A) 07/01/2019 1930   AMPHETMU NONE DETECTED 07/01/2019 1930   THCU NONE DETECTED 07/01/2019 1930   LABBARB NONE DETECTED 07/01/2019 1930    Alcohol Level     Component Value Date/Time   ETH <10 07/01/2019 0759    IMAGING past 24 hours DG CHEST PORT 1 VIEW  Result Date: 07/11/2019 CLINICAL DATA:  Acute respiratory failure EXAM: PORTABLE CHEST 1 VIEW COMPARISON:  Jul 09, 2019 FINDINGS: The tracheostomy tube terminates above the carina. There are pockets of subcutaneous gas in the neck which are likely related to interval tracheostomy tube placement. The enteric tube extends below the left hemidiaphragm. There is no pneumothorax. The  lung volumes are slightly low. Hazy bibasilar airspace opacities are again noted which are similar to prior study. There is a probable small right-sided pleural effusion. There is no definite acute osseous abnormality. There is a lucency projecting over the upper abdomen which is likely artifactual. IMPRESSION: 1. Lines and tubes as above with interval placement of a tracheostomy tube. 2. No pneumothorax. 3. Persistent bibasilar airspace opacities. Probable small right-sided pleural effusion. Electronically Signed   By: Katherine Mantle M.D.   On: 07/11/2019 16:01   VAS [place] LOWER EXTREMITY VENOUS (DVT)  Result Date: 07/11/2019  Lower Venous DVTStudy Indications: Fever, and stroke.  Comparison Study: no prior Performing Technologist: Blanch Media RVS  Examination Guidelines: A complete evaluation includes B-mode imaging, spectral Doppler, color Doppler, and power Doppler as needed of all accessible portions of each vessel. Bilateral testing is considered an integral part of a complete examination. Limited examinations for reoccurring indications may be performed as noted. The reflux portion of the exam is performed with the patient in reverse Trendelenburg.  +---------+---------------+---------+-----------+----------+--------------+ RIGHT    CompressibilityPhasicitySpontaneityPropertiesThrombus Aging +---------+---------------+---------+-----------+----------+--------------+ CFV      None           No  No                   Acute          +---------+---------------+---------+-----------+----------+--------------+ SFJ      None                                         Acute          +---------+---------------+---------+-----------+----------+--------------+ FV Prox  None                                         Acute          +---------+---------------+---------+-----------+----------+--------------+ FV Mid   Full                                                         +---------+---------------+---------+-----------+----------+--------------+ FV DistalFull                                                        +---------+---------------+---------+-----------+----------+--------------+ PFV      None                                         Acute          +---------+---------------+---------+-----------+----------+--------------+ POP      None           No       No                   Acute          +---------+---------------+---------+-----------+----------+--------------+ PTV      None                                         Acute          +---------+---------------+---------+-----------+----------+--------------+ PERO     None                                         Acute          +---------+---------------+---------+-----------+----------+--------------+ EIV                                                   Not visualized +---------+---------------+---------+-----------+----------+--------------+ IIV                                                   Not visualized +---------+---------------+---------+-----------+----------+--------------+   +---------+---------------+---------+-----------+----------+--------------+  LEFT     CompressibilityPhasicitySpontaneityPropertiesThrombus Aging +---------+---------------+---------+-----------+----------+--------------+ CFV      None           No       No                   Acute          +---------+---------------+---------+-----------+----------+--------------+ SFJ      None                                         Acute          +---------+---------------+---------+-----------+----------+--------------+ FV Prox  None                                         Acute          +---------+---------------+---------+-----------+----------+--------------+ FV Mid   Full                                                         +---------+---------------+---------+-----------+----------+--------------+ FV DistalFull                                                        +---------+---------------+---------+-----------+----------+--------------+ PFV      Partial                                      Acute          +---------+---------------+---------+-----------+----------+--------------+ POP      Full           Yes      Yes                                 +---------+---------------+---------+-----------+----------+--------------+ PTV      Full                                                        +---------+---------------+---------+-----------+----------+--------------+ PERO     Full                                                        +---------+---------------+---------+-----------+----------+--------------+ EIV                                                   Not visualized +---------+---------------+---------+-----------+----------+--------------+ IIV  Not visualized +---------+---------------+---------+-----------+----------+--------------+     Summary: RIGHT: - Findings consistent with acute deep vein thrombosis involving the right common femoral vein, SF junction, right femoral vein, right proximal profunda vein, right popliteal vein, right posterior tibial veins, and right peroneal veins. - No cystic structure found in the popliteal fossa.  LEFT: - Findings consistent with acute deep vein thrombosis involving the left common femoral vein, SF junction, left femoral vein, and left proximal profunda vein. - No cystic structure found in the popliteal fossa.  *See table(s) above for measurements and observations.    Preliminary     PHYSICAL EXAM   Temp:  [99.9 F (37.7 C)-103.2 F (39.6 C)] 99.9 F (37.7 C) (05/09 0421) Pulse Rate:  [105-139] 107 (05/09 0600) Resp:  [17-32] 20 (05/09 0600) BP: (92-149)/(54-90) 109/76 (05/09  0600) SpO2:  [91 %-100 %] 95 % (05/09 0600) FiO2 (%):  [40 %-100 %] 100 % (05/09 0332)  General - Well nourished, well developed, intubated off sedation.   Ophthalmologic - fundi not visualized due to noncooperation.  Cardiovascular - Regular rate and rhythm.  Neuro - intubated off sedation, eyes closed but able to open on repeated voice stimulation, not following commands. With eye opening, eyes left gaze preference position, not blinking to visual threat bilaterally, doll's eyes present, PERRL. Corneal reflex weak on the right and brisk on the left, gag and cough present. Breathing over the vent.  Facial symmetry not able to test due to ET tube.  Tongue protrusion not cooperative.  Possibly intermittent following commands left hand.  Spontaneous and purposeful movement left UE 2/5 and LE 2/5. However, right UE and LE plegic. DTR 1+ and no babinski. Sensation, coordination and gait not tested.    ASSESSMENT/PLAN Mr. SALEM LEMBKE is a 49 y.o. male with history of COPD, ongoing tobacco use, and psoriasis, presenting with dense LMCA syndrome. Attempt for Left ICA and Left M1 thrombectomy was done emergently with TICI 2b flow. Unfortunately, the pt had post procedure hemorrhage and reocclusion of the vessels. F/u imaging suggested early edema and pt underwent prophylactic decompressive hemicraniectomy.  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  Stroke etiology unknown  Code Stroke CT head showed early hypodensity in nearly entire LMCA. ASPECTS 6-7 per notes.  CTA head & neck occlusion cervical ICA on the left. This is most likely due to dissection. The patient has minimal atherosclerotic disease.  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  There is poor collateral circulation on the left.   CTP CBF large core volume of 266 mL, perfusion volume 313 mL, mismatch volume 47 mL.  ASPECTS using CTP by Dr. Chestine Spore 6 or 7.   IR - TICI2c revascularization  MRI  Restricted diffusion throughout the majority of the left MCA and ACA vascular territories consistent with acute ischemia.  MRA - reocclusion of the intracranial left internal carotid artery. No flow related signal is seen within the left middle cerebral artery.  CT Head 07/04/19 - Slightly increased rightward midline shift with a small amount of herniation beneath the anterior falx. Otherwise unchanged examination.  CT repeat 5/6 stable infarcts and SAH but increasing edema w/ midline shift now  5mm and herniation through crani site  2D Echo - EF 55 - 60%. No cardiac source of emboli identified.   LDL 45  HgbA1c 5.5  subq heparin for VTE prophylaxis  No antithrombotic prior to admission, now on none d/t hemorrhagic conversion  Therapy recommendations:  SNF  Disposition:  pending  Respiratory failure  Intubated on vent  CCM on board  CXR 07/03/19- New consolidation in the right lower lobe and left infrahilar region concerning for pneumonia. Possible small layering right effusion.  CXR 5/3 mild pulm edema w/ atx vs infiltrate/aspiration  On cefepime 4/30>>5/2 for pneumonia  Augmentin 5/2>>5/4  Unasyn 5/5>>5.6 lasix  Likely need trach and PEG over the weekend.  Wife agreed.    Trach placed yesterday 5/8 per CCM  COPD exacerbation, resolved  On prednisone, ended 5/5  CCM on board  Cerebral edema  MRI - Restricted diffusion throughout the majority of the left MCA and ACA vascular territories consistent with acute ischemia.  CT Head 07/04/19 - Slightly increased rightward midline shift with a small amount of herniation beneath the anterior falx. Otherwise unchanged examination.  S/p decompressive  left hemicraniectomy  4/28 Dr Franky Machoabbell  CT 5/6 - stable infarcts and SAH but increasing edema w/ midline shift now 5mm and herniation through crani site  On keppra. Plan 2 wk course per NSG  Na 151->147->151  Off 3%. On D5NS @ 4275  Okay to allow Na gradually normalize  Fever  HCAP, resolved  Tmax 99.9->102.7->99.6->103.2  Leukocytosis - 12.4->12.6->12.6->9.6  CXR 5/3 - Suspected mild pulmonary edema with small layering effusions and associated bibasilar opacities, atelectasis versus infiltrate/aspiration.  CXR - 5/8 - Persistent bibasilar airspace opacities. Probable small right-sided pleural effusion.  CXR 5/9 - pending  UA neg  Sputum moraxella, H. flu   On cefepime 4/30>>5/2  Augmentin 5/2>>5/4, Unasyn 5/5>>5/6  Blood culture 5/2 NGTD   Blood cultures 5/7 - no growth < 24 hrs  Resp cultures - pending  Currently off all antibiotics  DVT suspected source of fever  Bilateral Lower Ext DVT  Venous US 07/11/2019  IVC filter recommended by NS per CCM note  Hypertension  Home meds:  None  Current meds: Normodyne prn ; Levophed prn  Stable - 109/76  SBP goal < 160 due to hemorrhagic conversion . Long-term BP goal normotensive  Dysphagia   NPO  On TF @ 20 due to illeus  On D5NS  Coretrak 5/7 in preparation of Trach  Dietitian on board  PEG likely - ? Scheduled ?  Ileus, improved  abd distension w/ decreased BS  KUB 5/2 distended colonic loop  TF on hold -> trickle feeds-> again stopped->trickle feeds @ 20  Miralax   Flexiseal following laxatives  On reglan, laxatives on hold, decrease fentanyl    Coretrak 5/7  K 4.2->3.3->3.9->3.5->4.4 - supplement - keep K > 4.0  CCM on board  Tobacco abuse  Current smoker  Smoking cessation counseling will be provided if able  Other Stroke Risk Factors  Obesity, Body mass index is 32.44 kg/m., recommend weight loss, diet and exercise as appropriate   Likely undiagnosed obstructive sleep  apnea based on body habitus  Other Active Problems  Code Status - Full code  K 4.2->3.3->3.9->3.5->4.4 - supplement - keep K > 4.0->3.5 - supplement - repeat pending  Thrombocytopenia - 166->51->61->68->88->93->92->106->118->136 - monitor - improving  Agitation. Wrist restraint  CMP and CBC pending  Tachycardia  Hypomagnesemia - 2.5->2.6  Hospital day # 11  Critically ill patient status post  thrombectomy and decompressive craniectomy.  Case is discussed at length with the family at the bedside.  Critical care time 20 minutes.   To contact Stroke Continuity provider, please refer to [email or web address]. After hours, contact General Neurology

## 2019-07-12 NOTE — Progress Notes (Signed)
Subjective: Patient underwent IVC filter placement yesterday.  Objective: Vital signs in last 24 hours: Temp:  [99.8 F (37.7 C)-103.2 F (39.6 C)] 99.8 F (37.7 C) (05/09 0700) Pulse Rate:  [104-139] 118 (05/09 0850) Resp:  [17-27] 20 (05/09 0850) BP: (92-149)/(54-90) 121/79 (05/09 0850) SpO2:  [91 %-100 %] 97 % (05/09 0907) FiO2 (%):  [40 %-100 %] 80 % (05/09 0907)  Intake/Output from previous day: 05/08 0701 - 05/09 0700 In: 362.7 [NG/GT:362.7] Out: 3835 [Urine:885; Stool:2950] Intake/Output this shift: No intake/output data recorded.  Eyes open to voice, regards but sleepy.  Following simple commands on left. Incision c/d Flap full.  Lab Results: Recent Labs    07/11/19 0528 07/12/19 0613  WBC 9.6 9.0  HGB 13.3 13.7  HCT 41.8 44.5  PLT 136* 155   BMET Recent Labs    07/11/19 0528 07/12/19 0613  NA 151* 155*  K 3.5 3.4*  CL 114* 120*  CO2 27 25  GLUCOSE 116* 139*  BUN 22* 29*  CREATININE 1.12 1.26*  CALCIUM 8.8* 8.9    Studies/Results: IR IVC FILTER PLMT / S&I /IMG GUID/MOD SED  Result Date: 07/12/2019 INDICATION: 49 year old male with a recent history of left MCA territory infarct status post large vessel stroke thrombectomy complicated by intraparenchymal hemorrhage ultimately requiring decompressive craniotomy. Patient has been recovering in the intensive care unit and unfortunately developed acute bilateral lower extremity DVT. Due to the recent stroke and intracranial hemorrhage, there is an absolute contraindication to anticoagulation. Therefore, the patient is a candidate for IVC filter placement for PE prophylaxis. EXAM: ULTRASOUND GUIDANCE FOR VASCULARACCESS IVC CATHETERIZATION AND VENOGRAM IVC FILTER INSERTION Interventional Radiologist:  Sterling Big, MD MEDICATIONS: None. ANESTHESIA/SEDATION: Fentanyl 50 mcg IV; Versed 1 mg IV Moderate Sedation Time:  10 minutes The patient was continuously monitored during the procedure by the interventional  radiology nurse under my direct supervision. FLUOROSCOPY TIME:  Fluoroscopy Time: 0 minutes 24 seconds (39 mGy). COMPLICATIONS: None immediate. PROCEDURE: Informed written consent was obtained from the patient after a thorough discussion of the procedural risks, benefits and alternatives. All questions were addressed. Maximal Sterile Barrier Technique was utilized including caps, mask, sterile gowns, sterile gloves, sterile drape, hand hygiene and skin antiseptic. A timeout was performed prior to the initiation of the procedure. Maximal barrier sterile technique utilized including caps, mask, sterile gowns, sterile gloves, large sterile drape, hand hygiene, and Betadine prep. Under sterile condition and local anesthesia, right internal jugular venous access was performed with ultrasound. An ultrasound image was saved and sent to PACS. Over a guidewire, the IVC filter delivery sheath and inner dilator were advanced into the IVC just above the IVC bifurcation. Contrast injection was performed for an IVC venogram. Through the delivery sheath, a retrievable Denali IVC filter was deployed below the level of the renal veins and above the IVC bifurcation. Limited post deployment venacavagram was performed. The delivery sheath was removed and hemostasis was obtained with manual compression. A dressing was placed. The patient tolerated the procedure well without immediate post procedural complication. FINDINGS: The IVC is patent. No evidence of thrombus, stenosis, or occlusion. No variant venous anatomy. Successful placement of the IVC filter below the level of the renal veins. IMPRESSION: Successful ultrasound and fluoroscopically guided placement of an infrarenal retrievable IVC filter via right jugular approach. PLAN: This IVC filter is potentially retrievable. The patient will be assessed for filter retrieval by Interventional Radiology in approximately 8-12 weeks. Further recommendations regarding filter retrieval,  continued surveillance or declaration of  device permanence, will be made at that time. Electronically Signed   By: Malachy Moan M.D.   On: 07/12/2019 09:38   DG CHEST PORT 1 VIEW  Result Date: 07/11/2019 CLINICAL DATA:  Acute respiratory failure EXAM: PORTABLE CHEST 1 VIEW COMPARISON:  Jul 09, 2019 FINDINGS: The tracheostomy tube terminates above the carina. There are pockets of subcutaneous gas in the neck which are likely related to interval tracheostomy tube placement. The enteric tube extends below the left hemidiaphragm. There is no pneumothorax. The lung volumes are slightly low. Hazy bibasilar airspace opacities are again noted which are similar to prior study. There is a probable small right-sided pleural effusion. There is no definite acute osseous abnormality. There is a lucency projecting over the upper abdomen which is likely artifactual. IMPRESSION: 1. Lines and tubes as above with interval placement of a tracheostomy tube. 2. No pneumothorax. 3. Persistent bibasilar airspace opacities. Probable small right-sided pleural effusion. Electronically Signed   By: Katherine Mantle M.D.   On: 07/11/2019 16:01   VAS [place] LOWER EXTREMITY VENOUS (DVT)  Result Date: 07/12/2019  Lower Venous DVTStudy Indications: Fever, and stroke.  Comparison Study: no prior Performing Technologist: Blanch Media RVS  Examination Guidelines: A complete evaluation includes B-mode imaging, spectral Doppler, color Doppler, and power Doppler as needed of all accessible portions of each vessel. Bilateral testing is considered an integral part of a complete examination. Limited examinations for reoccurring indications may be performed as noted. The reflux portion of the exam is performed with the patient in reverse Trendelenburg.  +---------+---------------+---------+-----------+----------+--------------+ RIGHT    CompressibilityPhasicitySpontaneityPropertiesThrombus Aging  +---------+---------------+---------+-----------+----------+--------------+ CFV      None           No       No                   Acute          +---------+---------------+---------+-----------+----------+--------------+ SFJ      None                                         Acute          +---------+---------------+---------+-----------+----------+--------------+ FV Prox  None                                         Acute          +---------+---------------+---------+-----------+----------+--------------+ FV Mid   Full                                                        +---------+---------------+---------+-----------+----------+--------------+ FV DistalFull                                                        +---------+---------------+---------+-----------+----------+--------------+ PFV      None  Acute          +---------+---------------+---------+-----------+----------+--------------+ POP      None           No       No                   Acute          +---------+---------------+---------+-----------+----------+--------------+ PTV      None                                         Acute          +---------+---------------+---------+-----------+----------+--------------+ PERO     None                                         Acute          +---------+---------------+---------+-----------+----------+--------------+ EIV                                                   Not visualized +---------+---------------+---------+-----------+----------+--------------+ IIV                                                   Not visualized +---------+---------------+---------+-----------+----------+--------------+   +---------+---------------+---------+-----------+----------+--------------+ LEFT     CompressibilityPhasicitySpontaneityPropertiesThrombus Aging  +---------+---------------+---------+-----------+----------+--------------+ CFV      None           No       No                   Acute          +---------+---------------+---------+-----------+----------+--------------+ SFJ      None                                         Acute          +---------+---------------+---------+-----------+----------+--------------+ FV Prox  None                                         Acute          +---------+---------------+---------+-----------+----------+--------------+ FV Mid   Full                                                        +---------+---------------+---------+-----------+----------+--------------+ FV DistalFull                                                        +---------+---------------+---------+-----------+----------+--------------+ PFV      Partial  Acute          +---------+---------------+---------+-----------+----------+--------------+ POP      Full           Yes      Yes                                 +---------+---------------+---------+-----------+----------+--------------+ PTV      Full                                                        +---------+---------------+---------+-----------+----------+--------------+ PERO     Full                                                        +---------+---------------+---------+-----------+----------+--------------+ EIV                                                   Not visualized +---------+---------------+---------+-----------+----------+--------------+ IIV                                                   Not visualized +---------+---------------+---------+-----------+----------+--------------+     Summary: RIGHT: - Findings consistent with acute deep vein thrombosis involving the right common femoral vein, SF junction, right femoral vein, right proximal profunda vein, right popliteal vein,  right posterior tibial veins, and right peroneal veins. - No cystic structure found in the popliteal fossa.  LEFT: - Findings consistent with acute deep vein thrombosis involving the left common femoral vein, SF junction, left femoral vein, and left proximal profunda vein. - No cystic structure found in the popliteal fossa.  *See table(s) above for measurements and observations. Electronically signed by Deitra Mayo MD on 07/12/2019 at 7:22:15 AM.    Final     Assessment/Plan: 49 yo M s/p L hemicraniectomy for CVA - given the significant hemorrhagic transformation already seen and the enormous volume of stroke, anticoagulation for DVT would be too dangerous to initiate at this time, but potentially in a few weeks time it would be okay. - continue supportive care   Vallarie Mare 07/12/2019, 10:15 AM

## 2019-07-13 ENCOUNTER — Inpatient Hospital Stay (HOSPITAL_COMMUNITY): Payer: Medicaid Other

## 2019-07-13 DIAGNOSIS — Z930 Tracheostomy status: Secondary | ICD-10-CM

## 2019-07-13 DIAGNOSIS — I82413 Acute embolism and thrombosis of femoral vein, bilateral: Secondary | ICD-10-CM

## 2019-07-13 LAB — COMPREHENSIVE METABOLIC PANEL
ALT: 104 U/L — ABNORMAL HIGH (ref 0–44)
AST: 38 U/L (ref 15–41)
Albumin: 2.2 g/dL — ABNORMAL LOW (ref 3.5–5.0)
Alkaline Phosphatase: 65 U/L (ref 38–126)
Anion gap: 9 (ref 5–15)
BUN: 31 mg/dL — ABNORMAL HIGH (ref 6–20)
CO2: 25 mmol/L (ref 22–32)
Calcium: 8.8 mg/dL — ABNORMAL LOW (ref 8.9–10.3)
Chloride: 121 mmol/L — ABNORMAL HIGH (ref 98–111)
Creatinine, Ser: 1.38 mg/dL — ABNORMAL HIGH (ref 0.61–1.24)
GFR calc Af Amer: >60 mL/min (ref >60)
GFR calc non Af Amer: 59 mL/min — ABNORMAL LOW (ref >60)
Glucose, Bld: 129 mg/dL — ABNORMAL HIGH (ref 70–99)
Potassium: 3.8 mmol/L (ref 3.5–5.1)
Sodium: 155 mmol/L — ABNORMAL HIGH (ref 135–145)
Total Bilirubin: 0.7 mg/dL (ref 0.3–1.2)
Total Protein: 6.6 g/dL (ref 6.5–8.1)

## 2019-07-13 LAB — PHOSPHORUS: Phosphorus: 2.6 mg/dL (ref 2.5–4.6)

## 2019-07-13 LAB — GLUCOSE, CAPILLARY
Glucose-Capillary: 124 mg/dL — ABNORMAL HIGH (ref 70–99)
Glucose-Capillary: 122 mg/dL — ABNORMAL HIGH (ref 70–99)
Glucose-Capillary: 131 mg/dL — ABNORMAL HIGH (ref 70–99)
Glucose-Capillary: 127 mg/dL — ABNORMAL HIGH (ref 70–99)
Glucose-Capillary: 119 mg/dL — ABNORMAL HIGH (ref 70–99)
Glucose-Capillary: 137 mg/dL — ABNORMAL HIGH (ref 70–99)
Glucose-Capillary: 110 mg/dL — ABNORMAL HIGH (ref 70–99)

## 2019-07-13 LAB — CBC
HCT: 43.9 % (ref 39.0–52.0)
Hemoglobin: 13.4 g/dL (ref 13.0–17.0)
MCH: 29.1 pg (ref 26.0–34.0)
MCHC: 30.5 g/dL (ref 30.0–36.0)
MCV: 95.2 fL (ref 80.0–100.0)
Platelets: 144 10*3/uL — ABNORMAL LOW (ref 150–400)
RBC: 4.61 MIL/uL (ref 4.22–5.81)
RDW: 14.8 % (ref 11.5–15.5)
WBC: 11.3 10*3/uL — ABNORMAL HIGH (ref 4.0–10.5)
nRBC: 0 % (ref 0.0–0.2)

## 2019-07-13 LAB — MAGNESIUM: Magnesium: 2.8 mg/dL — ABNORMAL HIGH (ref 1.7–2.4)

## 2019-07-13 MED ORDER — BUDESONIDE 0.5 MG/2ML IN SUSP
0.5000 mg | Freq: Two times a day (BID) | RESPIRATORY_TRACT | Status: DC
  Administered 2019-07-13 – 2019-07-18 (×9): 0.5 mg via RESPIRATORY_TRACT
  Filled 2019-07-13 (×10): qty 2

## 2019-07-13 MED ORDER — FREE WATER: 400.0000 mL | Status: DC

## 2019-07-13 MED ORDER — ASPIRIN 81 MG PO CHEW
81.0000 mg | CHEWABLE_TABLET | Freq: Every day | ORAL | Status: DC
  Administered 2019-07-13 – 2019-07-23 (×10): 81 mg
  Filled 2019-07-13 (×10): qty 1

## 2019-07-13 MED ORDER — FREE WATER
200.0000 mL | Status: DC
  Administered 2019-07-13 – 2019-07-14 (×5): 200 mL

## 2019-07-13 MED ORDER — VITAL HIGH PROTEIN PO LIQD: 1000.0000 mL | ORAL | Status: DC

## 2019-07-13 MED ORDER — FREE WATER
200.0000 mL | Status: DC
  Administered 2019-07-13: 12:00:00 200 mL

## 2019-07-13 MED ORDER — VITAL HIGH PROTEIN PO LIQD
1000.0000 mL | ORAL | Status: DC
  Administered 2019-07-13 – 2019-07-14 (×2): 1000 mL

## 2019-07-13 NOTE — Progress Notes (Signed)
STROKE TEAM PROGRESS NOTE   INTERVAL HISTORY Patient is sitting up in bed.  His wife and daughter at the bedside.  He is arousable and barely follows few commands but has purposeful movements on the left and remains plegic on the right.  Vital signs are stable.  No neurological changes.  He is still on ventilatory support on tracheostomy and is difficult to wean and remains in respiratory failure.  Serum sodium today is 155.  He had IVC filter placed yesterday.  Vitals:   07/13/19 0744 07/13/19 0759 07/13/19 0800 07/13/19 0802  BP: 112/78  105/77 105/77  Pulse: (!) 114  (!) 120 (!) 115  Resp: (!) 23  (!) 27 (!) 25  Temp:   98.9 F (37.2 C)   TempSrc:   Axillary   SpO2: 91% 94% 95% 94%  Weight:      Height:       CBC:  Recent Labs  Lab 07/12/19 0613 07/13/19 0654  WBC 9.0 11.3*  HGB 13.7 13.4  HCT 44.5 43.9  MCV 93.1 95.2  PLT 155 144*   Basic Metabolic Panel:  Recent Labs  Lab 07/11/19 0528 07/11/19 0528 07/12/19 0613 07/13/19 0654  NA 151*   < > 155* 155*  K 3.5   < > 3.4* 3.8  CL 114*   < > 120* 121*  CO2 27   < > 25 25  GLUCOSE 116*   < > 139* 129*  BUN 22*   < > 29* 31*  CREATININE 1.12   < > 1.26* 1.38*  CALCIUM 8.8*   < > 8.9 8.8*  MG 2.6*  --   --  2.8*  PHOS 3.0   < > 3.2 2.6   < > = values in this interval not displayed.    IMAGING past 24 hours No results found.  PHYSICAL EXAM   General -mildly obese middle-aged Caucasian male, i s/p tracheostomy on ventilatory support  Ophthalmologic - fundi not visualized due to noncooperation.  Cardiovascular - Regular rate and rhythm.  Neuro -s/p tracheostomy.  Off sedation, eyes opens. not following commands.   left gaze preference position, not blinking to visual threat bilaterally, doll's eyes present, PERRL. Corneal reflex weak on the right and brisk on the left, gag and cough present. Breathing over the vent.  Facial symmetry not able to test due to ET tube.  Tongue protrusion not cooperative.  Possibly  intermittent following commands left hand.  Spontaneous and purposeful movement left UE 2/5 and LE 2/5. However, right UE and LE plegic. DTR 1+ and no babinski. Sensation, coordination and gait not tested.    ASSESSMENT/PLAN [name] with history of COPD, ongoing tobacco use, and psoriasis, presenting with dense LMCA syndrome. Attempt for Left ICA and Left M1 thrombectomy was done emergently with TICI 2b flow. Unfortunately, the pt had post procedure hemorrhage and reocclusion of the vessels. F/u imaging suggested early edema and pt underwent prophylactic decompressive hemicraniectomy.  Patient was enrolled in the CHARM trial for cytotoxic edema but was a screen failure due to increase core size greater than 300 mL on DWI  Stroke: Large LMCA and ACA stroke d/t terminal left ICA occlusions s/p EVT but with reocclusion and malignant cerebral edema s/p left [facility].  Stroke etiology unknown - given DVT requiring eventual AC and high mRS, will not do further embolic workup  Code Stroke CT head showed early hypodensity in nearly entire LMCA. ASPECTS 6-7 per notes.  CTA head & neck occlusion cervical ICA on the left. This is most likely due to dissection. The patient has minimal atherosclerotic disease.  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  There is poor collateral circulation on the left.   CTP CBF large core volume of 266 mL, perfusion volume 313 mL, mismatch volume 47 mL.  ASPECTS using CTP by Dr. Carlis Abbott 6 or 7.   IR - TICI2c revascularization  MRI  Restricted diffusion throughout the majority of the left MCA and ACA vascular territories consistent with acute ischemia.  MRA - reocclusion of the intracranial left internal carotid artery. No flow related signal is seen within the left middle cerebral artery.  CT Head 07/04/19 - Slightly increased rightward midline shift with a small amount of  herniation beneath the anterior falx. Otherwise unchanged examination.  CT repeat 5/6 stable infarcts and SAH but increasing edema w/ midline shift now 43mm and herniation through crani site  2D Echo - EF 55 - 60%. No cardiac source of emboli identified.   LDL 45  HgbA1c 5.5  subq heparin for VTE prophylaxis  No antithrombotic prior to admission, no antithrombotics d/t hemorrhagic conversion, now ok to add aspirin 81. No AC at this time  Therapy recommendations:  SNF  Disposition:  Pending - consider LTACH - CCM placed consult  Respiratory failure s/p Trach  Intubated on vent  CCM on board  CXR 07/03/19- New consolidation in the right lower lobe and left infrahilar region concerning for pneumonia. Possible small layering right effusion.  CXR 5/3 mild pulm edema w/ atx vs infiltrate/aspiration  On cefepime 4/30>>5/2 for pneumonia  Augmentin 5/2>>5/4  Unasyn 5/5>>5.6 lasix  Trach placed 5/8 per CCM  CPAP  COPD exacerbation, resolved  On prednisone, ended 5/5  CCM on board  Cerebral edema  MRI - Restricted diffusion throughout the majority of the left MCA and ACA vascular territories consistent with acute ischemia.  CT Head 07/04/19 - Slightly increased rightward midline shift with a small amount of herniation beneath the anterior falx. Otherwise unchanged examination.  S/p decompressive left hemicraniectomy  4/28 Dr Christella Noa  CT 5/6 - stable infarcts and SAH but increasing edema w/ midline shift now 28mm and herniation through crani site  On keppra. Plan 2 wk course per NSG  Off 3%. On D5NS @ 64  Okay to allow Na gradually normalize  Na 155  Fever  HCAP, resolved  Tmax 103.2->101.8  Leukocytosis - 11.3  UA neg  Sputum moraxella, H. flu   On cefepime 4/30>>5/2; Augmentin 5/2>>5/4, Unasyn 5/5>>5/6  Blood culture 5/2 no growth  Blood cultures 5/7 - no growth   Resp cultures - no growth  CXR 5/9 - streaky BLL opacilites atx/consolication/PNA.  Possible R pleural effusion  Currently off all antibiotics  DVT suspected source of fever  Bilateral Lower Ext DVT  Venous US 07/11/2019  IVC filter placed 5/9  Ok for aspirin 81 now; consider AC in 2 mos   Hypertension  Home meds:  None  Current meds: Normodyne prn ; Levophed prn  Stable   SBP goal < 160 due to hemorrhagic conversion . Long-term BP goal normotensive  Dysphagia   NPO  Coretrak 5/7  On TF, free water  Dietitian on board  Consider PEG following LTACH transfer    Ileus, improved  abd distension w/ decreased BS  KUB 5/2 distended colonic loop  Miralax   Flexiseal following laxatives  On reglan, laxatives on hold, decrease fentanyl    Coretrak 5/7  TF advance to goal  K 3.8 - CCM managing  CCM on board  Tobacco abuse  Current smoker  Smoking cessation counseling will be provided if able  Other Stroke Risk Factors  Obesity, Body mass index is 32.44 kg/m., recommend weight loss, diet and exercise as appropriate   Likely undiagnosed obstructive sleep apnea based on body habitus  Other Active Problems  Code Status - Full code  K 3.8 - CCM managing  Thrombocytopenia - 144 - monitor - improving  Agitation. Wrist restraint  Tachycardia  Hypomagnesemia - 2.8  Hospital day # 12 Patient remains on ventilatory support for respiratory failure and difficult to wean.  Recommend referral to LTAC for further rehab needs and ventilator weaning.  Discussed with patient and wife and daughter and answered questions.  Discussed with Dr. Vassie Loll. This patient is critically ill and at significant risk of neurological worsening, death and care requires constant monitoring of vital signs, hemodynamics,respiratory and cardiac monitoring, extensive review of multiple databases, frequent neurological assessment, discussion with family, other specialists and medical decision making of high complexity.I have made any additions or clarifications directly  to the above note.This critical care time does not reflect procedure time, or teaching time or supervisory time of PA/NP/Med Resident etc but could involve care discussion time.  I spent 30 minutes of neurocritical care time  in the care of  this patient.    Delia Heady, MD   To contact Stroke Continuity provider, please refer to [email or web address]. After hours, contact General Neurology

## 2019-07-13 NOTE — Progress Notes (Signed)
Patient ID: Lee Hooper, male   DOB: March 24, 1970, 49 y.o.   MRN: [id number] BP 104/73   Pulse (!) 120   Temp 99.6 F (37.6 C) (Axillary)   Resp (!) 23   Ht 6' (1.829 m)   Wt 108.5 kg   SpO2 94%   BMI 32.44 kg/m  Sedated on ventilator. Had a desaturation event earlier.  Wound is clean, dry No new recommendations.

## 2019-07-13 NOTE — Progress Notes (Addendum)
NAME:  Lee Hooper, MRN:  [id number], DOB:  12-11-1970, LOS: 12 ADMISSION DATE:  07/01/2019, CONSULTATION DATE:  07/01/19 REFERRING MD:  Aroor MD, CHIEF COMPLAINT:  CVA    Brief History   Lee Hooper is a 49 yo M who presented as a code stroke after being found aphasic and nonverbal by his wife. CT/CTA studies showed left ICA/MCA thrombosis and pt was taken for IR thrombectomy for left ICA and left M1 occlusions. Repeat head CT was obtained following the procedure out of concern for possible hemorrhage and showed moderate basal ganglia hemorrhage vs stroke. Repeat MRI revealed that the ICA and MCA re-occluded with change in FLAIR sequence excluding him from repeat thrombectomy. He underwent a L hemicraniectomy. He was intubated for airway protection. PCCM consulted for vent management.  Past Medical History  Psoriasis  COPD  Tobacco Use  Significant Hospital Events   4/28: Admit; L ICA/MCA Thrombectomy with re-occlusion --> left hemicraniectomy, intubated for acute hypoxic respiratory failure  5/3: NAE. Was taken off precedex yesterday and pt opened his eyes. Prednisone 50 mg qd was begun. He had a persistent fever around 38.2, HR 110s, BP 140s/60s. Pt required increased O2 with secretions; CXR showed mild edema with small effusions c/f aspiration vs opacification. Blood culture x2 and urine culture collected, NGTD. ABG pending. Currently satting 92% on PRVC FiO2 80, PEEP 10, Plateau 22. WBC declined to 10.5 from 11.1, Na 151 from 156. Tube feeds were held d/t distended colonic loop in RLQ on KUB, to be continued today. Thrombocytopenia improved to 93 from 81.  5/4: Patient remains intubated with spontaneous movement of left arm and leg.  5/5: NAE. Pt remains intubated on PRN fetanyl. Will open eyes and spontaneously move left leg and arm.  5/6: NAE overnight. Intubated with no sedation. 5/7: NAE. Marland Kitchen Completed Unasyn yesterday. VSS, spO2 remains in low 90s the majority of the time on  620/40%/+5. Morning labs significant for HCO3 of 19. Otherwise unremarkable.  5/8: Ongoing fever.  Obtained ultrasound, positive for lower extremity DVT bilaterally.  Tracheostomy placed at bedside.,  Did have some SVT this responded to treating fever and rate control. 5/9: IVC filter placed Consults:  IR PCCM  Neuro (Primary) Neurosurgery  Procedures:  4/28 IR - L ICA/MCA Thrombectomy 4/28 L hemicraniectomy 4/28 Intubation  Significant Diagnostic Tests:  4/28 Admit CT Head Code Stroke:  Hyperdense left ICA and MCA compatible with acute thrombus. No acute infarct or hemorrhage.  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 The patient has minimal atherosclerotic disease. The left internal carotid artery is occluded through the terminus and extending in the left M1 M2 and M3 branches. There is poor collateral circulation on the left. There is a large territory infarct involving the left hemisphere. Infarct volume 266 mL.  4/28 Admit MR Brain:  Restricted diffusion throughout much of the left MCA vascular territory consistent with acute ischemia. Restricted diffusion consistent with acute ischemia is also present within the paramedian left frontoparietal lobes, although this is less well assessed due to the degree of motion degradation at the level of the vertex. There is little if any corresponding T2/FLAIR hyperintensity at these sites. No significant mass effect. No midline shift.  4/28 Post Thrombectomy CT Head: : 1. 3.0 x 2.6 x 3.9 cm region of hyperdensity centered within the left basal ganglia, left subinsular region and inferomedial left temporal lobe likely reflecting a  combination of parenchymal hematoma and contrast staining.  2. Edema with loss of gray-white differentiation within the left basal ganglia, left insula and anterior left temporal lobe, likely acute infarction. Subtle  changes of acute infarction are also suspected within the paramedian left frontal lobe ACA vascular territory. 3. Scattered small-volume subarachnoid hemorrhage along the left cerebral hemisphere. 4. Regional mass effect with effacement of the left lateral ventricle temporal horn. No midline shift.  4/28 Post Thrombectomy MR Brain:  1. Restricted diffusion throughout the majority of the left MCA and ACA vascular territories consistent with acute ischemia. 2. No significant mass effect at this time. No midline shift. 3. The acute parenchymal hemorrhage and/or contrast staining centered within left basal ganglia and inferomedial left temporal lobe on prior head CT does not appear significantly changed. Based on the MR appearance, it is suspected that a significant component of this previously demonstrated hyperdensity reflects contrast staining. 4. Redemonstrated small volume subarachnoid hemorrhage overlying the left cerebral hemisphere. Small volume subarachnoid hemorrhage is also questioned along the right cerebral hemisphere posteriorly.  4/29 Post Hemicraniectomy CT head:  Complete left ACA/MCA territory infarction with swollen brain bulging through craniectomy defect. No midline shift or entrapment. Petechial hemorrhage at left basal ganglia. Extraaxial hemorrhage along surface of the infarct.   5/1 Head CT > Slightly increased rightward midline shift with a small amount of herniation beneath the anterior falx. Otherwise unchanged Examination.  5/6 Head CT: Left ACA and MCA territory infarct with unchanged degree of subarachnoid hemorrhage. Swelling has progressed with greater herniation through the craniectomy defect and 5 mm of midline shift.  5/8: Bilateral acute DVT involving the right common femoral vein right proximal profundal vein and right popliteal vein also left DVT involving same vessels Micro Data:  4/28: SARS-CoV-2/Influenza A/Influenza B PCR: Negative  4/30: Respiratory  culture: Abundant Moraxella Catarrhalis, Haemophilus Influenzae, beta lactamase positive  5/2: Urine culture: NGTD  5/2: BC x2: NGTD 5/7 resp >> H flu 5/7 bld >> ng  Antimicrobials:  4/30 - 5/2: Cefepime 5/2 - 5/4: Augmentin  5/5 - Present: Unasyn, stopped  Interim history/subjective:   Events over weekend noted Afebrile On 60%/PEEP of 5 this morning Good urine output   Objective   Blood pressure 105/77, pulse (!) 115, temperature 98.9 F (37.2 C), temperature source Axillary, resp. rate (!) 25, height 6' (1.829 m), weight 108.5 kg, SpO2 94 %.    Vent Mode: PRVC FiO2 (%):  [50 %-60 %] 50 % Set Rate:  [16 bmp] 16 bmp Vt Set:  [620 mL] 620 mL PEEP:  [5 cmH20-8 cmH20] 5 cmH20 Pressure Support:  [12 cmH20] 12 cmH20 Plateau Pressure:  [15 cmH20-17 cmH20] 15 cmH20   Intake/Output Summary (Last 24 hours) at 07/13/2019 0920 Last data filed at 07/13/2019 0800 Gross per 24 hour  Intake 2365.44 ml  Output 1325 ml  Net 1040.44 ml   Filed Weights   07/08/19 0427 07/09/19 0436 07/10/19 0500  Weight: 112 kg 109.2 kg 108.5 kg    Examination: General: Middle-aged well-built white male currently resting in bed in no acute distress HEENT craniotomy sutures intact.  Now with size 6 tracheostomy, no bleeding Pulmonary: Clear to auscultation, minimal secretions Cardiac: Regular rate, tachycardic, no murmur rub or gallop Abdomen soft nontender, large volume of liquid stool from Flexi-Seal Neuro: Awake, follows commands, right hemiplegia GU: Clear yellow Extremities: Warm dry brisk cap refill  Resolved Hospital Problem list   treated HCAP with Moraxella and Haemophilus influenza, these were beta-lactamase positive, antibiotics were  completed 5/6 COPD exacerbation Ileus Assessment & Plan:  49 yo with h/o COPD and tobacco use presented with large L MCA stroke s/p IR thrombectomy and subsequent hemorrhage s/p hemicraniectomy.     Large Left ICA/MCA CVA s/p IR thrombectomy and left  decompressive hemicraniectomy  significant cerebral edema: Repeat head CT was obtained following the procedure out of concern for possible hemorrhage and showed moderate basal ganglia hemorrhage vs stroke. Repeat MRI revealed that the ICA and MCA re-occluded . Neurology primary team and managing. Now following commands and has progressively improving neuro status.  Plan Continue serial neuro checks Aggressive PT   Acute hypoxic respiratory failure requiring mechanical ventilation Tracheostomy 5/8 Underlying possible COPD -Had worsening hypoxia post trach on 5/8.   Plan Wean FiO2 to 50% Routine tracheostomy care Continue bronchodilators VAP bundle Start spontaneous breathing trials again with goal of trach collar  Acute bilateral lower extremity DVT Status post retrievable IVC filter 5/9  Plan  Persistent tachycardia?  PE , will not investigate since last do not change management Not a candidate for full anticoagulation right now  SVT, rate now sinus tach Plan As needed metoprolol   Fever, without leukocytosis suspect this is secondary to thromboembolic disease Plan Follow-up pending cultures Trend fever curve  Mild AKI/hyponatremia?  Related to contrast -Now on free water  Ileus: Resolving.   Plan Advance TFs to goal   Plan for LTAC if OK with other services  Best practice:  Diet: Tube Feeds  Pain/Anxiety/Delirium protocol (if indicated): minimize, fentanyl prn VAP protocol (if indicated):per protocol DVT prophylaxis: SCDs, heparin GI prophylaxis: Protonix Glucose control: WNL Mobility: Bedrest Code Status: FULL Family Communication: Wife updated at bedside Disposition: ICU   The patient is critically ill with multiple organ systems failure and requires high complexity decision making for assessment and support, frequent evaluation and titration of therapies, application of advanced monitoring technologies and extensive interpretation of multiple databases.  Critical Care Time devoted to patient care services described in this note independent of APP/resident  time is 31 minutes.   Kara Mead MD. Shade Flood. [facility]  If no response to pager , please call [phone]   07/13/2019

## 2019-07-14 ENCOUNTER — Inpatient Hospital Stay (HOSPITAL_COMMUNITY): Payer: Medicaid Other

## 2019-07-14 LAB — GLUCOSE, CAPILLARY
Glucose-Capillary: 125 mg/dL — ABNORMAL HIGH (ref 70–99)
Glucose-Capillary: 126 mg/dL — ABNORMAL HIGH (ref 70–99)
Glucose-Capillary: 135 mg/dL — ABNORMAL HIGH (ref 70–99)
Glucose-Capillary: 122 mg/dL — ABNORMAL HIGH (ref 70–99)
Glucose-Capillary: 118 mg/dL — ABNORMAL HIGH (ref 70–99)
Glucose-Capillary: 138 mg/dL — ABNORMAL HIGH (ref 70–99)

## 2019-07-14 LAB — COMPREHENSIVE METABOLIC PANEL
ALT: 126 U/L — ABNORMAL HIGH (ref 0–44)
AST: 73 U/L — ABNORMAL HIGH (ref 15–41)
Albumin: 2.3 g/dL — ABNORMAL LOW (ref 3.5–5.0)
Alkaline Phosphatase: 74 U/L (ref 38–126)
Anion gap: 13 (ref 5–15)
BUN: 48 mg/dL — ABNORMAL HIGH (ref 6–20)
CO2: 28 mmol/L (ref 22–32)
Calcium: 9.3 mg/dL (ref 8.9–10.3)
Chloride: 117 mmol/L — ABNORMAL HIGH (ref 98–111)
Creatinine, Ser: 1.72 mg/dL — ABNORMAL HIGH (ref 0.61–1.24)
GFR calc Af Amer: 53 mL/min — ABNORMAL LOW (ref >60)
GFR calc non Af Amer: 45 mL/min — ABNORMAL LOW (ref >60)
Glucose, Bld: 122 mg/dL — ABNORMAL HIGH (ref 70–99)
Potassium: 3.6 mmol/L (ref 3.5–5.1)
Sodium: 158 mmol/L — ABNORMAL HIGH (ref 135–145)
Total Bilirubin: 0.7 mg/dL (ref 0.3–1.2)
Total Protein: 6.9 g/dL (ref 6.5–8.1)

## 2019-07-14 LAB — CBC
HCT: 45.1 % (ref 39.0–52.0)
Hemoglobin: 13.7 g/dL (ref 13.0–17.0)
MCH: 29.1 pg (ref 26.0–34.0)
MCHC: 30.4 g/dL (ref 30.0–36.0)
MCV: 96 fL (ref 80.0–100.0)
Platelets: 155 10*3/uL (ref 150–400)
RBC: 4.7 MIL/uL (ref 4.22–5.81)
RDW: 15.1 % (ref 11.5–15.5)
WBC: 13.4 10*3/uL — ABNORMAL HIGH (ref 4.0–10.5)
nRBC: 0.1 % (ref 0.0–0.2)

## 2019-07-14 LAB — URINALYSIS, ROUTINE W REFLEX MICROSCOPIC
Bacteria, UA: NONE SEEN
Bilirubin Urine: NEGATIVE
Glucose, UA: NEGATIVE mg/dL
Hgb urine dipstick: NEGATIVE
Ketones, ur: NEGATIVE mg/dL
Leukocytes,Ua: NEGATIVE
Nitrite: NEGATIVE
Protein, ur: 30 mg/dL — AB
Specific Gravity, Urine: 1.029 (ref 1.005–1.030)
pH: 5 (ref 5.0–8.0)

## 2019-07-14 LAB — PHOSPHORUS: Phosphorus: 3.5 mg/dL (ref 2.5–4.6)

## 2019-07-14 LAB — MAGNESIUM: Magnesium: 3 mg/dL — ABNORMAL HIGH (ref 1.7–2.4)

## 2019-07-14 MED ORDER — VITAL HIGH PROTEIN PO LIQD
1000.0000 mL | ORAL | Status: AC
  Administered 2019-07-15 – 2019-07-16 (×2): 1000 mL
  Filled 2019-07-14: qty 1000

## 2019-07-14 MED ORDER — POTASSIUM CHLORIDE 20 MEQ/15ML (10%) PO SOLN
40.0000 meq | Freq: Two times a day (BID) | ORAL | Status: AC
  Administered 2019-07-14 (×2): 40 meq
  Filled 2019-07-14: qty 30

## 2019-07-14 MED ORDER — FREE WATER
150.0000 mL | Status: DC
  Administered 2019-07-14 – 2019-07-15 (×12): 150 mL

## 2019-07-14 MED ORDER — SODIUM CHLORIDE 0.9 % IV SOLN
2.0000 g | Freq: Three times a day (TID) | INTRAVENOUS | Status: DC
  Administered 2019-07-14 – 2019-07-15 (×3): 2 g via INTRAVENOUS
  Filled 2019-07-14 (×5): qty 2

## 2019-07-14 MED ORDER — METOPROLOL TARTRATE 25 MG/10 ML ORAL SUSPENSION
12.5000 mg | Freq: Two times a day (BID) | ORAL | Status: DC
  Filled 2019-07-14: qty 10

## 2019-07-14 MED ORDER — VANCOMYCIN HCL 1750 MG/350ML IV SOLN
1750.0000 mg | Freq: Once | INTRAVENOUS | Status: AC
  Administered 2019-07-14: 1750 mg via INTRAVENOUS
  Filled 2019-07-14: qty 350

## 2019-07-14 MED ORDER — SODIUM CHLORIDE 0.45 % IV BOLUS
250.0000 mL | Freq: Once | INTRAVENOUS | Status: AC
  Administered 2019-07-14: 250 mL via INTRAVENOUS

## 2019-07-14 MED ORDER — POTASSIUM CHLORIDE 20 MEQ/15ML (10%) PO SOLN
40.0000 meq | Freq: Two times a day (BID) | ORAL | Status: DC
  Filled 2019-07-14: qty 30

## 2019-07-14 MED ORDER — PRO-STAT SUGAR FREE PO LIQD
60.0000 mL | Freq: Two times a day (BID) | ORAL | Status: AC
  Administered 2019-07-14 – 2019-07-16 (×6): 60 mL
  Filled 2019-07-14 (×6): qty 60

## 2019-07-14 MED ORDER — VANCOMYCIN HCL 1500 MG/300ML IV SOLN
1500.0000 mg | INTRAVENOUS | Status: DC
  Administered 2019-07-15: 1500 mg via INTRAVENOUS
  Filled 2019-07-14: qty 300

## 2019-07-14 MED ORDER — METOPROLOL TARTRATE 25 MG/10 ML ORAL SUSPENSION
12.5000 mg | Freq: Two times a day (BID) | ORAL | Status: DC
  Administered 2019-07-14 – 2019-07-28 (×29): 12.5 mg
  Filled 2019-07-14: qty 5
  Filled 2019-07-14 (×2): qty 10
  Filled 2019-07-14: qty 5
  Filled 2019-07-14 (×3): qty 10
  Filled 2019-07-14: qty 5
  Filled 2019-07-14: qty 10
  Filled 2019-07-14 (×2): qty 5
  Filled 2019-07-14 (×4): qty 10
  Filled 2019-07-14 (×3): qty 5
  Filled 2019-07-14 (×2): qty 10
  Filled 2019-07-14: qty 5
  Filled 2019-07-14: qty 10
  Filled 2019-07-14: qty 5
  Filled 2019-07-14: qty 10
  Filled 2019-07-14: qty 5
  Filled 2019-07-14: qty 10
  Filled 2019-07-14 (×2): qty 5
  Filled 2019-07-14: qty 10

## 2019-07-14 MED ORDER — METOPROLOL TARTRATE 25 MG/10 ML ORAL SUSPENSION: 12.5000 mg | Freq: Two times a day (BID) | ORAL | Status: DC

## 2019-07-14 NOTE — Progress Notes (Signed)
Attestation for Kandice Robinsons, NP progress note 1324 07/14/19:   Attending note: I have seen and examined the patient. History, labs and imaging reviewed.  Interval History:  5/11: no acute events overnight. Pt with similar exam as previous however tachycardic and febrile (tmax102.7) with a bit increased wbc and new R infiltrate noted in base. Pan cx and start empiric abx.    Labs reviewed, significant for Na 158, Cr 1.72, WBC 11->13  Imaging: cxr with new R basilar infiltrate, personally reviewed by me.  Assessment/plan: Acute on chronic hypoxic resp failure:  -s/p trach -not tolerating ATC but able to do so when tolerating.  -cont with cpap trials as tolerated R basilar infiltrate, hcap suspect gn or gp:  -resp cx pending -follow wbc/fever curve -resume abx cefepime/vanc Sepsis 2/2 above:  -as above -blood cx also pending -ua negative  Hypernatremia:  -iatrogenic, was drifting down but back up today -increase free water, certainly increased losses with tmax >102  Extensive LE DVT:  -s/p ivcf, unable to do a/c 2/2 ICH -suspect pe is in play as well but with arf no need to stress kidney's more for ctpa when tx is same.   Tachycardia:  -multifactorial -suspect underlying PE as well as fever, sepsis  ARF:  -following uop and indices -rising currently with baseline ~1 (but no history prior to presentation) -hold further diuresis.   L ICA/MCA CVA s/p thrombectomy and L decompressive hemicraniectomy:  -significant cerebral edema -not following commands but purposeful on my exam.  -cont with PT/OT  The patient is critically ill with multiple organ systems failure and requires high complexity decision making for assessment and support, frequent evaluation and titration of therapies, application of advanced monitoring technologies and extensive interpretation of multiple databases.  Critical care time 42 mins. This represents my time independent of the NP's/PA's/med  student/residents time taking care of the pt. This is excluding procedures   Briant Sites DO [facility] 07/14/2019, 1:51 PM

## 2019-07-14 NOTE — Progress Notes (Signed)
STROKE TEAM PROGRESS NOTE   INTERVAL HISTORY Patient remains on ventilatory support and tracheostomy for respiratory failure.  He remains tachycardic and febrile.  Chest x-ray shows new infiltrate and critical care team has restarted antibiotics and sent blood cultures and follow temperature curve and WBC count.  Neurologically he remains unchanged is arousable follows only occasional commands and remains plegic on the right.  Neurosurgery plan on repeating CT scan of the head tomorrow.  Patient has no health insurance hence LTAC is not a possibility.  Currently he is not being weaned off ventilatory support due to his fever and tachycardia and respiratory distress.  Labs show worsening white count, serum sodium as well as creatinine  Vitals:   07/14/19 0728 07/14/19 0800 07/14/19 1137 07/14/19 1200  BP: 108/76 115/77 108/69 96/78  Pulse: (!) 132 (!) 131 (!) 133 (!) 130  Resp: (!) 24 (!) 23 (!) 23 (!) 24  Temp:  98.1 F (36.7 C)  (!) 101.4 F (38.6 C)  TempSrc:    Axillary  SpO2: 92% 93% 93% 94%  Weight:      Height:       CBC:  Recent Labs  Lab 07/13/19 0654 07/14/19 0635  WBC 11.3* 13.4*  HGB 13.4 13.7  HCT 43.9 45.1  MCV 95.2 96.0  PLT 144* 155   Basic Metabolic Panel:  Recent Labs  Lab 07/13/19 0654 07/14/19 0635  NA 155* 158*  K 3.8 3.6  CL 121* 117*  CO2 25 28  GLUCOSE 129* 122*  BUN 31* 48*  CREATININE 1.38* 1.72*  CALCIUM 8.8* 9.3  MG 2.8* 3.0*  PHOS 2.6 3.5    IMAGING past 24 hours No results found.  PHYSICAL EXAM     General -mildly obese middle-aged Caucasian male, i s/p tracheostomy on ventilatory support  Ophthalmologic - fundi not visualized due to noncooperation.  Cardiovascular - Regular rate and rhythm.  Neuro -s/p tracheostomy.  Off sedation, eyes closed but easily opens and only occasionally following midline commands.   left gaze preference position, not blinking to visual threat bilaterally, doll's eyes present, PERRL. Corneal reflex weak  on the right and brisk on the left, gag and cough present. Breathing over the vent.  Facial symmetry not able to test due to ET tube.  Tongue protrusion not cooperative.  Possibly intermittent following commands left hand.  Spontaneous and purposeful movement left UE 2/5 and LE 2/5. However, right UE and LE plegic. DTR 1+ and no babinski. Sensation, coordination and gait not tested.    ASSESSMENT/PLAN Mr. EMMANUEL GRUENHAGEN is a 49 y.o. male with history of COPD, ongoing tobacco use, and psoriasis, presenting with dense LMCA syndrome. Attempt for Left ICA and Left M1 thrombectomy was done emergently with TICI 2b flow. Unfortunately, the pt had post procedure hemorrhage and reocclusion of the vessels. F/u imaging suggested early edema and pt underwent prophylactic decompressive hemicraniectomy.  Patient was enrolled in the CHARM trial for cytotoxic edema but was a screen failure due to increase core size greater than 300 mL on DWI  Stroke: Large LMCA and ACA stroke d/t terminal left ICA occlusions s/p EVT but with reocclusion and malignant cerebral edema s/p left [facility].  Stroke etiology unknown - given DVT requiring eventual AC and high mRS, will not do further embolic workup  Code Stroke CT head showed early hypodensity in nearly entire LMCA. ASPECTS 6-7 per notes.  CTA head & neck occlusion cervical ICA on the left. This is most likely due to dissection. The  patient has minimal atherosclerotic disease.  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  There is poor collateral circulation on the left.   CTP CBF large core volume of 266 mL, perfusion volume 313 mL, mismatch volume 47 mL.  ASPECTS using CTP by Dr. Chestine Spore 6 or 7.   IR - TICI2c revascularization  MRI  Restricted diffusion throughout the majority of the left MCA and ACA vascular territories consistent with acute ischemia.  MRA - reocclusion of the intracranial left  internal carotid artery. No flow related signal is seen within the left middle cerebral artery.  CT Head 07/04/19 - Slightly increased rightward midline shift with a small amount of herniation beneath the anterior falx. Otherwise unchanged examination.  CT repeat 5/6 stable infarcts and SAH but increasing edema w/ midline shift now 69mm and herniation through crani site  2D Echo - EF 55 - 60%. No cardiac source of emboli identified.   LDL 45  HgbA1c 5.5  subq heparin for VTE prophylaxis  No antithrombotic prior to admission, no antithrombotics d/t hemorrhagic conversion, now on aspirin 81. Consider AC in 2 mos  Therapy recommendations:  SNF  Disposition:  Pending - LTACH consulted however no insurance  Respiratory failure s/p Trach  Intubated on vent  CCM on board  CXR 07/03/19- New consolidation in the right lower lobe and left infrahilar region concerning for pneumonia. Possible small layering right effusion.  CXR 5/3 mild pulm edema w/ atx vs infiltrate/aspiration  On cefepime 4/30>>5/2 for pneumonia  Augmentin 5/2>>5/4  Unasyn 5/5>>5.6 lasix  Trach placed 5/8 per CCM  Fever and elevated white count and tachycardia hence restarted on antibiotics on 07/14/2019  COPD exacerbation, resolved  On prednisone, ended 5/5  Cerebral edema  MRI - Restricted diffusion throughout the majority of the left MCA and ACA vascular territories consistent with acute ischemia.  CT Head 07/04/19 - Slightly increased rightward midline shift with a small amount of herniation beneath the anterior falx. Otherwise unchanged examination.  S/p decompressive left hemicraniectomy  4/28 Dr Franky Macho  CT 5/6 - stable infarcts and SAH but increasing edema w/ midline shift now 39mm and herniation through crani site  On keppra. Plan 2 wk course per NSG  Off 3%. On D5NS @ 81  Okay to allow Na gradually normalize  Na 155  Fever w/ leukocytosis HCAP, resolved  Tmax 102.7  Leukocytosis -  11.3  UA neg  Sputum moraxella, H. flu   On cefepime 4/30>>5/2; Augmentin 5/2>>5/4, Unasyn 5/5>>5/6   Blood culture 5/2 no growth  Blood cultures 5/7 - no growth   Resp cultures 5/7 - no growth  CXR 5/9 - streaky BLL opacilites atx/consolication/PNA. Possible R pleural effusion  CXR 5/10 w/ atx vs infiltrate  Sputum cx 5/11 pending   Blood Cx 5/11 pending   Vanc 5/11>>, Cefepime 5/11>>  Bilateral Lower Ext DVT Possible PE given tachycardia  Venous US 07/11/2019 DVT + R common femoral, SF jxn, R femoral, R proximal profunda, R popliteal, R posterior tib & R peroneal veins. DVT + L common femoral, SF jxn, L femoral, L proximal profund veins.  IVC filter placed 5/9  On aspirin 81 now; consider AC in 2 mos   Hypertension Tachycardia  Home meds:  None  Current meds: none scheduled  BP On the low side   SBP goal < 160 due to hemorrhagic conversion . Long-term BP goal normotensive . Metoprolol prn but watch low  BP  Dysphagia   NPO  Coretrak 5/7  On TF, free water  SLP on board  PEG - consult trauma   Ileus, improved  abd distension w/ decreased BS  KUB 5/2 distended colonic loop  Miralax   Flexiseal following laxatives  On reglan, laxatives on hold, decrease fentanyl    Coretrak 5/7  TF advance to goal  K 3.8 - CCM managing  Tobacco abuse  Current smoker  Smoking cessation counseling will be provided if able  Other Stroke Risk Factors  Obesity, Body mass index is 32.44 kg/m., recommend weight loss, diet and exercise as appropriate   Likely undiagnosed obstructive sleep apnea based on body habitus  Other Active Problems  Code Status - Full code  Hypokalemia - resolved  Thrombocytopenia - resolved  Agitation. Wrist restraint  Hypomagnesemia - 2.8  Hospital day # 13 Patient continues to be critically ill with worsening sepsis, respiratory failure and pneumonia and has been restarted on antibiotics.  He does not have insurance  and will not qualify for LTAC facility as per Education officer, museum.  Continue weaning trials when able to tolerate as per CCM team.  No family at the bedside for discussion today.  Discussed with Dr. Ruthann Cancer critical care MD  This patient is critically ill and at significant risk of neurological worsening, death and care requires constant monitoring of vital signs, hemodynamics,respiratory and cardiac monitoring, extensive review of multiple databases, frequent neurological assessment, discussion with family, other specialists and medical decision making of high complexity.I have made any additions or clarifications directly to the above note.This critical care time does not reflect procedure time, or teaching time or supervisory time of PA/NP/Med Resident etc but could involve care discussion time. I spent 32 minutes of neurocritical care time  in the care of  this patient.   Antony Contras, MD   To contact Stroke Continuity provider, please refer to [email or web address] After hours, contact General Neurology

## 2019-07-14 NOTE — Progress Notes (Signed)
Patient ID: Lee Hooper, male   DOB: 1970-05-22, 49 y.o.   MRN: [id number] BP 116/80   Pulse (!) 126   Temp 98.9 F (37.2 C)   Resp (!) 21   Ht 6' (1.829 m)   Wt 108.5 kg   SpO2 95%   BMI 32.44 kg/m  Opening both eyes to voice Not following commands Perrl, full eom Wound is clean, dry, no signs of infection.  Sutures are out.

## 2019-07-14 NOTE — Progress Notes (Addendum)
Nutrition Follow-up  DOCUMENTATION CODES:   Obesity unspecified  INTERVENTION:   Advance to goal:  Vital High Protein @ 50 ml/hr (1200 ml/day) via Cortrak tube  60 ml Prostat BID  150 ml free water every 2 hours: 1800 ml   Provides: 1600 kcal, 165 grams protein, and 1003 ml free water.  Total free water: 2803 ml   NUTRITION DIAGNOSIS:   Inadequate oral intake related to inability to eat as evidenced by NPO status. Ongoing.   GOAL:   Patient will meet greater than or equal to 90% of their needs Meeting with TF.   MONITOR:   TF tolerance  REASON FOR ASSESSMENT:   Consult, Ventilator Enteral/tube feeding initiation and management  ASSESSMENT:   Pt with PMH of smoking and COPD admitted with L ICA/MCA s/p IR for revascularization however pt had re-occlusion now s/p emergent L hemicraniectomy.   Pt discussed during ICU rounds and with RN.  Per RN tolerating Vital High Protein @ 30. Spoke with CCM, ok to advance TF to goal. Pt continues to have fevers. UOP down. Per Chest xray pt with atelectasis vs infiltrate   4/30 cortrak placed; tip gastric 5/2 TF held due to ileus 5/3 TF resume at trickle  5/4 TF held due to worsening abd distention; reglan added 5/5 per xray ileus no better; reglan dose increased 5/6 trickle TF started 5/8 trach placed; IVC filter placed   Patient is currently intubated on ventilator support MV: 14.5 L/min Temp (24hrs), Avg:100 F (37.8 C), Min:98.1 F (36.7 C), Max:102.7 F (39.3 C)  Medications reviewed and include: MVI 40 mEq KCl BID Labs reviewed: Na 158 (H) CBG's: 125-126-135 UOP: 300 ml x 24 hrs   Diet Order:   Diet Order            Diet NPO time specified  Diet effective midnight              EDUCATION NEEDS:   No education needs have been identified at this time  Skin:  Skin Assessment: Reviewed RN Assessment  Last BM:  175 ml via rectal tube  Height:   Ht Readings from Last 1 Encounters:  07/02/19 6'  (1.829 m)    Weight:   Wt Readings from Last 1 Encounters:  07/10/19 108.5 kg    Ideal Body Weight:  80.9 kg  BMI:  Body mass index is 32.44 kg/m.  Estimated Nutritional Needs:   Kcal:  1400-1600  Protein:  >161 grams  Fluid:  >1.5 L/day  Cammy Copa., RD, LDN, CNSC See AMiON for contact information

## 2019-07-14 NOTE — Progress Notes (Signed)
Physical Therapy Treatment Patient Details Name: Lee Hooper MRN: [id number] DOB: January 13, 1971 Today's Date: 07/14/2019    History of Present Illness This 49 y.o. male admitted 4/28 with Rt sided hemiplegia and aphasia.  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  Repeat CT following procedure showed moderate basal ganglia hemorrhage vs. stroke   Repeat MRI revealed that the ICA and MCA re-occluded with change in FLAIR sequence excluding him from repeat thrombectomy.  He underwent a decompressive hemicraniectomy.  He waws intubated 4/28 and remains intubated.  He developed an ileus 5/2. Trach 5/8. IVC filter placed 5/9.   PMH includes: COPD, tobacco abuse, psoriasis    PT Comments    Patient progressing with interactions this session able to perform functional tasks with bed up in chair egress position with cleaning his mouth initially with hand over hand assist progressing to min A, working on noticing R side able to apply lotion with L hand on R arm and leg, working to scroll on phone to find music with L hand and able to find targets in midline with visual focus.  Feel progressig well to the point with consistency may consider asking for rehab consult.  For now remains appropriate for SNF level rehab. PT to follow acutely.   Follow Up Recommendations  SNF     Equipment Recommendations  Wheelchair (measurements PT);Wheelchair cushion (measurements PT);Hospital bed    Recommendations for Other Services       Precautions / Restrictions Precautions Precautions: Fall Precaution Comments: R hemi, trach, vent, coretrach, flexiseal Required Braces or Orthoses: (asked nsg to order Prevalon for R foot)    Mobility  Bed Mobility Overal bed mobility: Needs Assistance             General bed mobility comments: scooted up in bed total A, then bed up to chair position, pt able to assist to reposition shoulders to L pulling with OT's hand on L side; sitting up in chair  position working on reaching with L across midline to apply lotion to R hand and leg and reach for rail, mod A to sit up away from back of bed up in chair position  Transfers                 General transfer comment: unable to attempt   Ambulation/Gait                 Stairs             Wheelchair Mobility    Modified Rankin (Stroke Patients Only) Modified Rankin (Stroke Patients Only) Pre-Morbid Rankin Score: No symptoms Modified Rankin: Severe disability     Balance Overall balance assessment: Needs assistance   Sitting balance-Leahy Scale: Poor Sitting balance - Comments: mod A for balance up away from back of the bed in chair position attending to R side with max cues; able to do oral care with hand over hand A on the L UE, pt initiating with moving toothette around to cleanse his mouth once assisted to get it in his mouth                                    Cognition Arousal/Alertness: Lethargic;Awake/alert Behavior During Therapy: Flat affect(smiling at times) Overall Cognitive Status: Impaired/Different from baseline Area of Impairment: Attention;Following commands;Awareness;Problem solving  Current Attention Level: Sustained   Following Commands: Follows one step commands inconsistently     Problem Solving: Slow processing;Decreased initiation;Difficulty sequencing;Requires verbal cues;Requires tactile cues General Comments: apparently apraxic. With repetition pt ablet o complete movemetns, washing out eyes, rubbing beard. Pt handed cell phone with music adn pt scrolling thought music using L thumb. Good attention to task      Exercises General Exercises - Upper Extremity Shoulder Flexion: PROM;Right;10 reps;Seated Elbow Flexion: PROM;AAROM;5 reps;Right;Left General Exercises - Lower Extremity Ankle Circles/Pumps: PROM;Right;5 reps Heel Slides: AAROM;PROM;5 reps;Left;Right Other Exercises Other  Exercises: PROM RUE through all planes x 5    General Comments General comments (skin integrity, edema, etc.): on vent via trach PRVC mode 50% FiO2 PEEP 5, SpO2 & BP WNL, HR up to 140 max during activity      Pertinent Vitals/Pain Pain Assessment: Faces Faces Pain Scale: Hurts little more Pain Location: grimacing with movement of his head Pain Descriptors / Indicators: Grimacing Pain Intervention(s): Limited activity within patient's tolerance    Home Living                      Prior Function            PT Goals (current goals can now be found in the care plan section) Acute Rehab PT Goals Patient Stated Goal: per wife for pt to get better Progress towards PT goals: Progressing toward goals    Frequency    Min 2X/week      PT Plan Current plan remains appropriate    Co-evaluation PT/OT/SLP Co-Evaluation/Treatment: Yes Reason for Co-Treatment: Complexity of the patient's impairments (multi-system involvement);For patient/therapist safety;Necessary to address cognition/behavior during functional activity PT goals addressed during session: Balance;Strengthening/ROM OT goals addressed during session: ADL's and self-care;Strengthening/ROM      AM-PAC PT "6 Clicks" Mobility   Outcome Measure  Help needed turning from your back to your side while in a flat bed without using bedrails?: Total Help needed moving from lying on your back to sitting on the side of a flat bed without using bedrails?: Total Help needed moving to and from a bed to a chair (including a wheelchair)?: Total Help needed standing up from a chair using your arms (e.g., wheelchair or bedside chair)?: Total Help needed to walk in hospital room?: Total Help needed climbing 3-5 steps with a railing? : Total 6 Click Score: 6    End of Session Equipment Utilized During Treatment: Oxygen(vent) Activity Tolerance: Patient tolerated treatment well Patient left: in bed;with call bell/phone within  reach;with bed alarm set   PT Visit Diagnosis: Difficulty in walking, not elsewhere classified (R26.2);Hemiplegia and hemiparesis Hemiplegia - Right/Left: Right Hemiplegia - dominant/non-dominant: Dominant Hemiplegia - caused by: Nontraumatic intracerebral hemorrhage     Time: 1003-1033 PT Time Calculation (min) (ACUTE ONLY): 30 min  Charges:  $Therapeutic Activity: 8-22 mins                     Sheran Lawless, [place] [facility] [phone] 07/14/2019    Elray Mcgregor 07/14/2019, 4:24 PM

## 2019-07-14 NOTE — Progress Notes (Addendum)
Occupational Therapy Treatment Patient Details Name: [name] MRN: [id number] DOB: September 18, 1970 Today's Date: 07/14/2019    History of present illness This 49 y.o. male admitted 4/28 with Rt sided hemiplegia and aphasia.  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  Repeat CT following procedure showed moderate basal ganglia hemorrhage vs. stroke   Repeat MRI revealed that the ICA and MCA re-occluded with change in FLAIR sequence excluding him from repeat thrombectomy.  He underwent a decompressive hemicraniectomy.  He waws intubated 4/28 and remains intubated.  He developed an ileus 5/2. Trach 5/8. IVC filter placed 5/9.   PMH includes: COPD, tobacco abuse, psoriasis   OT comments  Spoke to wife [name] over the phone for more information. 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 He enjoys listening to "old country" music. Pt demonstrating increased ability to participate during co-treat today. Egress position used to simulate chair position and enable facilitation of weight shifts. Pt using LUE to pull self toward L to help reposition.  Pt apparently apraxic but able to complete functional tasks with facilitation to wipe out eyes, rub beard, apply lotion to R arm/leg and scroll through music on cell phone using L thumb. L gaze preference although able to sustain visual attention at midline. Most likely R field cut. Pt smiling at times during session. Increased head control noted. HR sustained in high 130s - nsg aware.Other VSS. Asked nsg to order Prevalon for R foot. Will continue to follow acutely.   Follow Up Recommendations  SNF;Other (comment)(pending progress)    Equipment Recommendations  Other (comment)(TBA)    Recommendations for Other Services      Precautions / Restrictions Precautions Precautions: Fall Precaution Comments: R hemi, trach, vent, coretrach, flexiseal Required Braces or Orthoses: (asked nsg to order Prevalon for R  foot)       Mobility Bed Mobility Overal bed mobility: Needs Assistance             General bed mobility comments: Egress position used to address trunk control and weight shifts. Pt ableto initiate head control adn pull self toward L using L hand. Grasped rails to help with weight sifts  Transfers                 General transfer comment: unable to attempt     Balance Overall balance assessment: Needs assistance   Sitting balance-Leahy Scale: Poor Sitting balance - Comments: R lateral lean; Increased ability to right self to midline                                   ADL either performed or assessed with clinical judgement   ADL       Grooming: Maximal assistance;Oral care;Wash/dry face;Cueing for sequencing Grooming Details (indicate cue type and reason): Yonker place in hand and required hand over hand to initiate. with repetitive movement,  pt began to "brush teeth" and move head to brush teeth. Pt initially resisteive to bring hand ot eyes, once pt brought hand to eye, pt washed eyes out; hand to beard to rub beard; lotion placed on R arm adn pt rubbed in lotion; also rubbed in lotion on B thighs                               General ADL  Comments: Increased ability to participate in ADL tasks     Vision   Vision Assessment?: Vision impaired- to be further tested in functional context Additional Comments: Most likely R field cut; appears to have a dysconjugate gaze; L gaze preference; Able to briefly sustain visual attention in L field   Perception     Praxis      Cognition Arousal/Alertness: Lethargic;Awake/alert Behavior During Therapy: Flat affect(smiling at times) Overall Cognitive Status: Impaired/Different from baseline Area of Impairment: Attention;Following commands;Awareness;Problem solving                   Current Attention Level: Sustained   Following Commands: Follows one step commands inconsistently      Problem Solving: Slow processing;Decreased initiation;Difficulty sequencing;Requires verbal cues;Requires tactile cues General Comments: apparently apraxic. With repetition pt ablet o complete movemetns, washing out eyes, rubbing beard. Pt handed cell phone with music adn pt scrolling thought music using L thumb. Good attention to task        Exercises Exercises: Other exercises General Exercises - Upper Extremity Shoulder Flexion: PROM;Right;10 reps;Seated Elbow Flexion: PROM;AAROM;5 reps;Right;Left General Exercises - Lower Extremity Ankle Circles/Pumps: PROM;Right;5 reps Heel Slides: AAROM;PROM;5 reps;Left;Right Other Exercises Other Exercises: PROM RUE through all planes x 5   Shoulder Instructions       General Comments on vent via trach PRVC mode 50% FiO2 PEEP 5. SpO2 & BP WNL, HR up to 140 max during activity.  Flaccid RUE; no active movement noted    Pertinent Vitals/ Pain       Pain Assessment: Faces Faces Pain Scale: Hurts little more Pain Location: grimacing with movement of his head Pain Descriptors / Indicators: Grimacing Pain Intervention(s): Limited activity within patient's tolerance  Home Living                                          Prior Functioning/Environment              Frequency  Min 2X/week        Progress Toward Goals  OT Goals(current goals can now be found in the care plan section)  Progress towards OT goals: Progressing toward goals  Acute Rehab OT Goals Patient Stated Goal: per wife for pt to get better OT Goal Formulation: Patient unable to participate in goal setting Time For Goal Achievement: 07/21/19 Potential to Achieve Goals: Fair ADL Goals Pt Will Perform Grooming: with mod assist;bed level Additional ADL Goal #1: Pt will locate items on his Rt with mod cues/prompting Additional ADL Goal #2: Pt will maintain EOB sitting x 5 mins with max A in prep for ADLs Additional ADL Goal #3: Pt will  demonstrate sustained attention during simple ADLs with Min cues Additional ADL Goal #4: Pt will perform bed mobility with Mod A +2 in preparation for ADLs  Plan Discharge plan remains appropriate    Co-evaluation    PT/OT/SLP Co-Evaluation/Treatment: Yes Reason for Co-Treatment: Complexity of the patient's impairments (multi-system involvement);For patient/therapist safety;Necessary to address cognition/behavior during functional activity PT goals addressed during session: Balance;Strengthening/ROM OT goals addressed during session: ADL's and self-care;Strengthening/ROM      AM-PAC OT "6 Clicks" Daily Activity     Outcome Measure   Help from another person eating meals?: Total Help from another person taking care of personal grooming?: A Lot Help from another person toileting, which includes using toliet, bedpan, or urinal?: Total Help from  another person bathing (including washing, rinsing, drying)?: Total Help from another person to put on and taking off regular upper body clothing?: Total Help from another person to put on and taking off regular lower body clothing?: Total 6 Click Score: 7    End of Session Equipment Utilized During Treatment: Other (comment)(vent PRVC; FiO2 50%; Peep5)  OT Visit Diagnosis: Unsteadiness on feet (R26.81);Cognitive communication deficit (R41.841);Hemiplegia and hemiparesis;Muscle weakness (generalized) (M62.81);Apraxia (R48.2);Other symptoms and signs involving cognitive function;Pain Symptoms and signs involving cognitive functions: Cerebral infarction Hemiplegia - Right/Left: Right Hemiplegia - dominant/non-dominant: Dominant Hemiplegia - caused by: Cerebral infarction Pain - part of body: (generalized)   Activity Tolerance Patient tolerated treatment well   Patient Left in bed;in CPM;with bed alarm set;with restraints reapplied   Nurse Communication Mobility status;Other (comment)(HR)        Time: 7169-6789 OT Time Calculation (min):  40 min  Charges: OT General Charges $OT Visit: 1 Visit OT Treatments $Self Care/Home Management : 8-22 mins $Neuromuscular Re-education: 8-22 mins  Maurie Boettcher, OT/L   Acute OT Clinical Specialist Acute Rehabilitation Services Pager [phone] Office [phone]    [facility] 07/14/2019, 3:33 PM

## 2019-07-14 NOTE — Progress Notes (Signed)
Pharmacy Antibiotic Note  Lee Hooper is a 49 y.o. male admitted on 07/01/2019 with a CVA s/p IR thrombectomy for left ICA and left M1 occlusions. Repeat CT after procedure was concerning for possible hemorrhage. Repeat MRI demonstrated that the ICA and MCA re-occluded. Patient has been intubated for airway protection. Now there is concern for sepsis.  Pharmacy has been consulted for vancomycin and cefepime dosing.  Patient's WBC continues to increase from 11.3 to 13.4. He is febrile with a Tmax of 102.7. Patient does have DVTs and a potential PE, which could contribute to fever and tachycardia. Scr continues to worsen from 1.38 and 1.72. Urine output is very minimal at 300 mL in the last 24 hours. Patient also had a desaturation event occur 5/10. Blood pressure is low, but has not yet required vasopressors. Of note, patient was recently treated with m cat. And h. Influenza pneumonia.  Plan: Give vancomycin 1750 mg IV x 1 as loading dose Start vancomycin 1500 mg  IV every 24 hours after loading dose Goal trough 15-20 mcg/mL. Cefepime 2g IV q8 hours Monitor temp, WBC, Scr, clinical status, and cultures  Height: 6' (182.9 cm) Weight: 108.5 kg (239 lb 3.2 oz) IBW/kg (Calculated) : 77.6  Temp (24hrs), Avg:100 F (37.8 C), Min:98.1 F (36.7 C), Max:102.7 F (39.3 C)  Recent Labs  Lab 07/10/19 0525 07/11/19 0528 07/12/19 0613 07/13/19 0654 07/14/19 0635  WBC 12.6* 9.6 9.0 11.3* 13.4*  CREATININE 0.91 1.12 1.26* 1.38* 1.72*    Estimated Creatinine Clearance: 65.4 mL/min (A) (by C-G formula based on SCr of 1.72 mg/dL (H)).    No Known Allergies  Antimicrobials this admission: Cefepime 4/30 >>5/2 Augmentin 5/2>>5/5 Unasyn 5/5 >> 5/6 Cefepime 5/11 >> Vancomycin 5/11 >>  Microbiology results: 4/30 TA: m cat, h. Influenzae - both beta lactamase positive  5/2 UCx - negative 5/2 BCx - negative 5/7 Bcx - negative 5/7 Endotracheal - few h. Influenza (beta lactamase  positive)  Thank you for allowing pharmacy to be a part of this patient's care.  Alvia Grove, PharmD PGY1 Acute Care Pharmacy Resident 07/14/2019 10:38 AM

## 2019-07-14 NOTE — Progress Notes (Addendum)
NAME:  Lee Hooper, MRN:  [id number], DOB:  1971/01/19, LOS: 78 ADMISSION DATE:  07/01/2019, CONSULTATION DATE:  07/01/19 REFERRING MD:  Aroor MD, CHIEF COMPLAINT:  CVA    Brief History   Zaylen Susman is a 49 yo M who presented as a code stroke after being found aphasic and nonverbal by his wife. CT/CTA studies showed left ICA/MCA thrombosis and pt was taken for IR thrombectomy for left ICA and left M1 occlusions. Repeat head CT was obtained following the procedure out of concern for possible hemorrhage and showed moderate basal ganglia hemorrhage vs stroke. Repeat MRI revealed that the ICA and MCA re-occluded with change in FLAIR sequence excluding him from repeat thrombectomy. He underwent a L hemicraniectomy. He was intubated for airway protection. PCCM consulted for vent management.  Past Medical History  Psoriasis  COPD  Tobacco Use  Significant Hospital Events   4/28: Admit; L ICA/MCA Thrombectomy with re-occlusion --> left hemicraniectomy, intubated for acute hypoxic respiratory failure  5/3: NAE. Was taken off precedex yesterday and pt opened his eyes. Prednisone 50 mg qd was begun. He had a persistent fever around 38.2, HR 110s, BP 140s/60s. Pt required increased O2 with secretions; CXR showed mild edema with small effusions c/f aspiration vs opacification. Blood culture x2 and urine culture collected, NGTD. ABG pending. Currently satting 92% on PRVC FiO2 80, PEEP 10, Plateau 22. WBC declined to 10.5 from 11.1, Na 151 from 156. Tube feeds were held d/t distended colonic loop in RLQ on KUB, to be continued today. Thrombocytopenia improved to 93 from 81.  5/4: Patient remains intubated with spontaneous movement of left arm and leg.  5/5: NAE. Pt remains intubated on PRN fetanyl. Will open eyes and spontaneously move left leg and arm.  5/6: NAE overnight. Intubated with no sedation. 5/7: NAE. Marland Kitchen Completed Unasyn yesterday. VSS, spO2 remains in low 90s the majority of the time on  620/40%/+5. Morning labs significant for HCO3 of 19. Otherwise unremarkable.  5/8: Ongoing fever.  Obtained ultrasound, positive for lower extremity DVT bilaterally.  Tracheostomy placed at bedside.,  Did have some SVT this responded to treating fever and rate control. 5/9: IVC filter placed 5/11:  Consults:  IR PCCM  Neuro (Primary) Neurosurgery  Procedures:  4/28 IR - L ICA/MCA Thrombectomy 4/28 L hemicraniectomy 4/28 Intubation  Significant Diagnostic Tests:  4/28 Admit CT Head Code Stroke:  Hyperdense left ICA and MCA compatible with acute thrombus. No acute infarct or hemorrhage.  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 The patient has minimal atherosclerotic disease. The left internal carotid artery is occluded through the terminus and extending in the left M1 M2 and M3 branches. There is poor collateral circulation on the left. There is a large territory infarct involving the left hemisphere. Infarct volume 266 mL.  4/28 Admit MR Brain:  Restricted diffusion throughout much of the left MCA vascular territory consistent with acute ischemia. Restricted diffusion consistent with acute ischemia is also present within the paramedian left frontoparietal lobes, although this is less well assessed due to the degree of motion degradation at the level of the vertex. There is little if any corresponding T2/FLAIR hyperintensity at these sites. No significant mass effect. No midline shift.  4/28 Post Thrombectomy CT Head: : 1. 3.0 x 2.6 x 3.9 cm region of hyperdensity centered within the left basal ganglia, left subinsular region and inferomedial left temporal lobe likely  reflecting a combination of parenchymal hematoma and contrast staining.  2. Edema with loss of gray-white differentiation within the left basal ganglia, left insula and anterior left temporal lobe, likely acute infarction.  Subtle changes of acute infarction are also suspected within the paramedian left frontal lobe ACA vascular territory. 3. Scattered small-volume subarachnoid hemorrhage along the left cerebral hemisphere. 4. Regional mass effect with effacement of the left lateral ventricle temporal horn. No midline shift.  4/28 Post Thrombectomy MR Brain:  1. Restricted diffusion throughout the majority of the left MCA and ACA vascular territories consistent with acute ischemia. 2. No significant mass effect at this time. No midline shift. 3. The acute parenchymal hemorrhage and/or contrast staining centered within left basal ganglia and inferomedial left temporal lobe on prior head CT does not appear significantly changed. Based on the MR appearance, it is suspected that a significant component of this previously demonstrated hyperdensity reflects contrast staining. 4. Redemonstrated small volume subarachnoid hemorrhage overlying the left cerebral hemisphere. Small volume subarachnoid hemorrhage is also questioned along the right cerebral hemisphere posteriorly.  4/29 Post Hemicraniectomy CT head:  Complete left ACA/MCA territory infarction with swollen brain bulging through craniectomy defect. No midline shift or entrapment. Petechial hemorrhage at left basal ganglia. Extraaxial hemorrhage along surface of the infarct.   5/1 Head CT > Slightly increased rightward midline shift with a small amount of herniation beneath the anterior falx. Otherwise unchanged Examination.  5/6 Head CT: Left ACA and MCA territory infarct with unchanged degree of subarachnoid hemorrhage. Swelling has progressed with greater herniation through the craniectomy defect and 5 mm of midline shift.  5/8: Bilateral acute DVT involving the right common femoral vein right proximal profundal vein and right popliteal vein also left DVT involving same vessels Micro Data:  4/28: SARS-CoV-2/Influenza A/Influenza B PCR: Negative  4/30:  Respiratory culture: Abundant Moraxella Catarrhalis, Haemophilus Influenzae, beta lactamase positive  5/2: Urine culture: NGTD  5/2: BC x2: NGTD 5/7 resp >> H flu 5/7 bld >> ng  Antimicrobials:  4/30 - 5/2: Cefepime 5/2 - 5/4: Augmentin  5/5 - Present: Unasyn, stopped  Interim history/subjective:  T Max last 24 hours 102.7 Tachycardia HR 137 BP 105 Systolic On 50%/PEEP of 5 this morning RR was in 30's, RN gave fentanyl with RR responding into 22 range + 3 L, but incontinent x 2 episodes 5/10 No maintenance IVF, but getting 1200 cc daily in free water 300 cc UO  Last 24 hours Slight bump in creatinine Na 158   Objective   Blood pressure 115/77, pulse (!) 131, temperature 98.1 F (36.7 C), resp. rate (!) 23, height 6' (1.829 m), weight 108.5 kg, SpO2 93 %.    Vent Mode: PRVC FiO2 (%):  [50 %-60 %] 50 % Set Rate:  [16 bmp] 16 bmp Vt Set:  [620 mL] 620 mL PEEP:  [5 cmH20] 5 cmH20 Plateau Pressure:  [16 cmH20-20 cmH20] 16 cmH20   Intake/Output Summary (Last 24 hours) at 07/14/2019 0931 Last data filed at 07/14/2019 0800 Gross per 24 hour  Intake 1449.33 ml  Output 300 ml  Net 1149.33 ml   Filed Weights   07/08/19 0427 07/09/19 0436 07/10/19 0500  Weight: 112 kg 109.2 kg 108.5 kg    Examination: General: Middle-aged well-built white male currently resting in bed in no acute distress HEENT craniotomy sutures intact.  Now with size 6 tracheostomy, no bleeding Pulmonary: Bilateral chest excursion, Rhonchi, , copious  secretions Cardiac: S1, S2, RRR, tachycardic, no murmur rub or gallop Abdomen  soft nontender, large volume of liquid stool from Flexi-Seal Neuro: sedated this am, , follows commands, right hemiplegia GU: Concentrated amber urine yellow Extremities: Warm dry brisk cap refill  Resolved Hospital Problem list   treated HCAP with Moraxella and Haemophilus influenza, these were beta-lactamase positive, antibiotics were completed 5/6 COPD exacerbation Ileus  Assessment & Plan:  49 yo with h/o COPD and tobacco use presented with large L MCA stroke s/p IR thrombectomy and subsequent hemorrhage s/p hemicraniectomy.     Large Left ICA/MCA CVA s/p IR thrombectomy and left decompressive hemicraniectomy  significant cerebral edema: Repeat head CT was obtained following the procedure out of concern for possible hemorrhage and showed moderate basal ganglia hemorrhage vs stroke. Repeat MRI revealed that the ICA and MCA re-occluded . Neurology primary team and managing. Now following commands and has progressively improving neuro status.  Plan Continue serial neuro checks Aggressive PT/ OT Per Neuro surgery   Acute hypoxic respiratory failure requiring mechanical ventilation Tracheostomy 5/8 Underlying possible COPD - Tachypnea 5/11 - Increase in secretions 5/11 Plan Titrate oxygen as able for sats > 94% Routine tracheostomy care Continue bronchodilators VAP bundle Start spontaneous breathing trials again with goal of trach collar  Fever, WBC up trending  Plan  Acute bilateral lower extremity DVT Status post retrievable IVC filter 5/9  Plan  Persistent tachycardia?  PE , will not investigate since last do not change management Not a candidate for full anticoagulation right now  SVT, rate now sinus tach ? Possible PE contributing to rate>> IVC Filter placed Plan As needed metoprolol   Fever, with  Leukocytosis CXR 5/10 with atelectasis vs infiltrate  BC and Sputum Cx from 5/7>> Pending/ No growth Plan Culture Sputum Blood Cx UA Trend fever curve and WBC Start Vanc and Cefepime    Mild AKI/hypernatremia?  Related to contrast Increasing despite free water Q 4 Plan Increase free water to Q 2  Trend Na  250 cc 0.45 NS bolus now Trend BMET  Ileus: Resolving.   Plan Advance TFs to goal   Plan for LTAC if OK with other services>> issue with placement as no insurance  Best practice:  Diet: Tube Feeds   Pain/Anxiety/Delirium protocol (if indicated): minimize, fentanyl prn VAP protocol (if indicated):per protocol DVT prophylaxis: SCDs, heparin GI prophylaxis: Protonix Glucose control: WNL Mobility: Bedrest Code Status: FULL Family Communication: Wife updated at bedside Disposition: ICU   The patient is critically ill with multiple organ systems failure and requires high complexity decision making for assessment and support, frequent evaluation and titration of therapies, application of advanced monitoring technologies and extensive interpretation of multiple databases. Critical Care Time devoted to patient care services described in this note independent of APP/resident  time is 31 minutes.   Bevelyn Ngo, MSN, AGACNP-BC [facility] Pulmonary/Critical Care Medicine Pager # [phone] After 4 pm please call [phone] 07/14/2019

## 2019-07-15 ENCOUNTER — Inpatient Hospital Stay (HOSPITAL_COMMUNITY): Payer: Medicaid Other

## 2019-07-15 DIAGNOSIS — I639 Cerebral infarction, unspecified: Secondary | ICD-10-CM

## 2019-07-15 LAB — POCT I-STAT 7, (LYTES, BLD GAS, ICA,H+H)
Acid-Base Excess: 0 mmol/L (ref 0.0–2.0)
Bicarbonate: 24.4 mmol/L (ref 20.0–28.0)
Calcium, Ion: 1.36 mmol/L (ref 1.15–1.40)
HCT: 36 % — ABNORMAL LOW (ref 39.0–52.0)
Hemoglobin: 12.2 g/dL — ABNORMAL LOW (ref 13.0–17.0)
O2 Saturation: 98 %
Patient temperature: 99.4
Potassium: 3.5 mmol/L (ref 3.5–5.1)
Sodium: 154 mmol/L — ABNORMAL HIGH (ref 135–145)
TCO2: 26 mmol/L (ref 22–32)
pCO2 arterial: 37.8 mmHg (ref 32.0–48.0)
pH, Arterial: 7.42 (ref 7.350–7.450)
pO2, Arterial: 102 mmHg (ref 83.0–108.0)

## 2019-07-15 LAB — GLUCOSE, CAPILLARY
Glucose-Capillary: 110 mg/dL — ABNORMAL HIGH (ref 70–99)
Glucose-Capillary: 132 mg/dL — ABNORMAL HIGH (ref 70–99)
Glucose-Capillary: 134 mg/dL — ABNORMAL HIGH (ref 70–99)
Glucose-Capillary: 117 mg/dL — ABNORMAL HIGH (ref 70–99)
Glucose-Capillary: 118 mg/dL — ABNORMAL HIGH (ref 70–99)
Glucose-Capillary: 118 mg/dL — ABNORMAL HIGH (ref 70–99)

## 2019-07-15 LAB — COMPREHENSIVE METABOLIC PANEL WITH GFR
ALT: 118 U/L — ABNORMAL HIGH (ref 0–44)
AST: 58 U/L — ABNORMAL HIGH (ref 15–41)
Albumin: 2.1 g/dL — ABNORMAL LOW (ref 3.5–5.0)
Alkaline Phosphatase: 70 U/L (ref 38–126)
Anion gap: 13 (ref 5–15)
BUN: 71 mg/dL — ABNORMAL HIGH (ref 6–20)
CO2: 21 mmol/L — ABNORMAL LOW (ref 22–32)
Calcium: 9.3 mg/dL (ref 8.9–10.3)
Chloride: 119 mmol/L — ABNORMAL HIGH (ref 98–111)
Creatinine, Ser: 1.87 mg/dL — ABNORMAL HIGH (ref 0.61–1.24)
GFR calc Af Amer: 48 mL/min — ABNORMAL LOW
GFR calc non Af Amer: 41 mL/min — ABNORMAL LOW
Glucose, Bld: 153 mg/dL — ABNORMAL HIGH (ref 70–99)
Potassium: 3.6 mmol/L (ref 3.5–5.1)
Sodium: 153 mmol/L — ABNORMAL HIGH (ref 135–145)
Total Bilirubin: 0.8 mg/dL (ref 0.3–1.2)
Total Protein: 6.6 g/dL (ref 6.5–8.1)

## 2019-07-15 LAB — CULTURE, BLOOD (ROUTINE X 2)
Culture: NO GROWTH
Culture: NO GROWTH
Special Requests: ADEQUATE

## 2019-07-15 LAB — PROTEIN / CREATININE RATIO, URINE
Creatinine, Urine: 99.22 mg/dL
Protein Creatinine Ratio: 0.85 mg/mg{Cre} — ABNORMAL HIGH (ref 0.00–0.15)
Total Protein, Urine: 84 mg/dL

## 2019-07-15 LAB — CBC
HCT: 41.3 % (ref 39.0–52.0)
Hemoglobin: 12.5 g/dL — ABNORMAL LOW (ref 13.0–17.0)
MCH: 28.9 pg (ref 26.0–34.0)
MCHC: 30.3 g/dL (ref 30.0–36.0)
MCV: 95.4 fL (ref 80.0–100.0)
Platelets: 151 10*3/uL (ref 150–400)
RBC: 4.33 MIL/uL (ref 4.22–5.81)
RDW: 15.2 % (ref 11.5–15.5)
WBC: 11.9 10*3/uL — ABNORMAL HIGH (ref 4.0–10.5)
nRBC: 0.3 % — ABNORMAL HIGH (ref 0.0–0.2)

## 2019-07-15 LAB — PHOSPHORUS: Phosphorus: 4.2 mg/dL (ref 2.5–4.6)

## 2019-07-15 LAB — SODIUM, URINE, RANDOM: Sodium, Ur: 19 mmol/L

## 2019-07-15 LAB — MAGNESIUM: Magnesium: 2.8 mg/dL — ABNORMAL HIGH (ref 1.7–2.4)

## 2019-07-15 MED ORDER — SODIUM CHLORIDE 0.9 % IV SOLN
2.0000 g | Freq: Two times a day (BID) | INTRAVENOUS | Status: DC
  Administered 2019-07-15 – 2019-07-16 (×2): 2 g via INTRAVENOUS
  Filled 2019-07-15 (×3): qty 2

## 2019-07-15 MED ORDER — POTASSIUM CHLORIDE 20 MEQ/15ML (10%) PO SOLN
40.0000 meq | Freq: Once | ORAL | Status: AC
  Administered 2019-07-15: 40 meq
  Filled 2019-07-15: qty 30

## 2019-07-15 MED ORDER — FREE WATER
200.0000 mL | Status: DC
  Administered 2019-07-15 – 2019-07-18 (×26): 200 mL

## 2019-07-15 MED ORDER — LACTATED RINGERS IV BOLUS
500.0000 mL | Freq: Once | INTRAVENOUS | Status: AC
  Administered 2019-07-15: 500 mL via INTRAVENOUS

## 2019-07-15 MED ORDER — VANCOMYCIN HCL 1250 MG/250ML IV SOLN
1250.0000 mg | INTRAVENOUS | Status: DC
  Administered 2019-07-16: 1250 mg via INTRAVENOUS
  Filled 2019-07-15 (×2): qty 250

## 2019-07-15 NOTE — Progress Notes (Signed)
Occupational Therapy Treatment Patient Details Name: Lee Hooper MRN: [id number] DOB: 27-Jun-1970 Today's Date: 07/15/2019    History of present illness This 49 y.o. male admitted 4/28 with Rt sided hemiplegia and aphasia.  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  Repeat CT following procedure showed moderate basal ganglia hemorrhage vs. stroke   Repeat MRI revealed that the ICA and MCA re-occluded with change in FLAIR sequence excluding him from repeat thrombectomy.  He underwent a decompressive hemicraniectomy.  He waws intubated 4/28 and remains intubated.  He developed an ileus 5/2. Trach 5/8. IVC filter placed 5/9.   PMH includes: COPD, tobacco abuse, psoriasis   OT comments  Pt seen on CPAP Weaning mode; Peep 5; 40% FiO2. BP 116/82 (sitting); spO2 86-94); HR 119-126; RR in the 30s. Egress position used to bring pt forward in bed. Increased alertness with change of position. Pt more lethargic today but able to participate with simple grooming tasks (rubbing lotion on legs, wiping out eye). Pt with increased spontaneous movement of head and improved head control at times. Pt able to help pull forward in upright sitting but requires Max A +2 to maintain for short period of time (seconds). Music played and pt tapping foot to music at times. R gaze preference but able to maintain gaze at midline for short periods; smiling at times. Will need extensive post acute rehab. Will continue to follow acutely. Recommend pt move into chair position in bed at least TID if able.    Follow Up Recommendations  SNF;Supervision/Assistance - 24 hour    Equipment Recommendations  Other (comment)(TBA)    Recommendations for Other Services      Precautions / Restrictions Precautions Precautions: Fall Precaution Comments: R hemi, trach, vent, coretrach, flexiseal       Mobility Bed Mobility Overal bed mobility: Needs Assistance             General bed mobility comments: Egress  opsition used to facilitate trunk and head control Pt actively moving head when more alert; Max A suing to facilitate forward weight shift and trunk rotation; Used repetition to attempt to facilitate pt pulling on bedrail with L hand to shift weight forward.   Transfers                      Balance     Sitting balance-Leahy Scale: Zero Sitting balance - Comments: R lateral lean                                   ADL either performed or assessed with clinical judgement   ADL Overall ADL's : Needs assistance/impaired Eating/Feeding: NPO   Grooming: Maximal assistance Grooming Details (indicate cue type and reason): pt mvoing head awawy form [place] today; did rub lotion on legs; wiped L eye with cloth; attempted hand over hand to wipe R eye however pt pushed therapist's hand away                                     Vision   Vision Assessment?: Vision impaired- to be further tested in functional context Additional Comments: R eye open further than L; eyes closed majority of session today; ? dysconjugate gaze   Perception     Praxis      Cognition Arousal/Alertness: Lethargic Behavior During Therapy:  Flat affect;Impulsive;Restless Overall Cognitive Status: Impaired/Different from baseline Area of Impairment: Attention;Following commands;Safety/judgement;Awareness;Problem solving                   Current Attention Level: Focused   Following Commands: Follows one step commands inconsistently Safety/Judgement: Decreased awareness of safety;Decreased awareness of deficits Awareness: Intellectual Problem Solving: Slow processing;Decreased initiation;Difficulty sequencing;Requires verbal cues;Requires tactile cues General Comments: More lethargic this session. Pt had been on wean mode which may have affected his tolerance adn aability to participate. Became more restless at times, reaching for his trach and attempting to rub his  head/stapel area        Exercises Exercises: General Upper Extremity General Exercises - Upper Extremity Shoulder Flexion: PROM;Right;5 reps;Supine Shoulder Extension: PROM;Right;5 reps Shoulder ABduction: PROM;Right;5 reps Elbow Flexion: PROM;Right;5 reps Wrist Flexion: PROM;Right;5 reps Digit Composite Flexion: PROM;Right;5 reps Composite Extension: PROM;Right;5 reps   Shoulder Instructions       General Comments      Pertinent Vitals/ Pain       Pain Assessment: Faces Faces Pain Scale: Hurts little more Pain Location: grimacing with movement of his head Pain Descriptors / Indicators: Grimacing Pain Intervention(s): Limited activity within patient's tolerance  Home Living                                          Prior Functioning/Environment              Frequency  Min 2X/week        Progress Toward Goals  OT Goals(current goals can now be found in the care plan section)  Progress towards OT goals: Progressing toward goals  Acute Rehab OT Goals Patient Stated Goal: per wife for pt to get better OT Goal Formulation: Patient unable to participate in goal setting Time For Goal Achievement: 07/21/19 Potential to Achieve Goals: Fair ADL Goals Pt Will Perform Grooming: with mod assist;bed level Additional ADL Goal #1: Pt will locate items on his Rt with mod cues/prompting Additional ADL Goal #2: Pt will maintain EOB sitting x 5 mins with max A in prep for ADLs Additional ADL Goal #3: Pt will demonstrate sustained attention during simple ADLs with Min cues Additional ADL Goal #4: Pt will perform bed mobility with Mod A +2 in preparation for ADLs  Plan Discharge plan remains appropriate    Co-evaluation                 AM-PAC OT "6 Clicks" Daily Activity     Outcome Measure   Help from another person eating meals?: Total Help from another person taking care of personal grooming?: A Lot Help from another person toileting, which  includes using toliet, bedpan, or urinal?: Total Help from another person bathing (including washing, rinsing, drying)?: Total Help from another person to put on and taking off regular upper body clothing?: Total Help from another person to put on and taking off regular lower body clothing?: Total 6 Click Score: 7    End of Session Equipment Utilized During Treatment: Other (comment)  OT Visit Diagnosis: Unsteadiness on feet (R26.81);Cognitive communication deficit (R41.841);Hemiplegia and hemiparesis;Muscle weakness (generalized) (M62.81);Apraxia (R48.2);Other symptoms and signs involving cognitive function;Pain Symptoms and signs involving cognitive functions: Cerebral infarction Hemiplegia - Right/Left: Right Hemiplegia - dominant/non-dominant: Dominant Hemiplegia - caused by: Cerebral infarction Pain - part of body: (neck)   Activity Tolerance Patient limited by fatigue   Patient Left  in bed;with call bell/phone within reach;with bed alarm set;with SCD's reapplied   Nurse Communication Mobility status        Time: 4431-5400 OT Time Calculation (min): 45 min  Charges: OT General Charges $OT Visit: 1 Visit OT Treatments $Self Care/Home Management : 8-22 mins $Neuromuscular Re-education: 23-37 mins  Luisa Dago, OT/L   Acute OT Clinical Specialist Acute Rehabilitation Services Pager [phone] Office [phone]    [facility] 07/15/2019, 5:58 PM

## 2019-07-15 NOTE — Progress Notes (Signed)
STROKE TEAM PROGRESS NOTE   INTERVAL HISTORY Patient's neurological exam remains unchanged.  He is sleepy but can be easily aroused.  He has some spontaneous left-sided movements.  He follows occasional midline and some commands on the left but not consistently.  He remains in respiratory failure on ventilatory support on ventilator.  He is being weaned this morning.  He remains tachycardic though heart rate is better in the 120s today.  Temperature has come down.  He is on vancomycin and cefepime.  Chest x-ray shows some improvement.  Consulted trauma team for PEG tube in the next few days.  Vitals:   07/15/19 1000 07/15/19 1100 07/15/19 1124 07/15/19 1137  BP: 114/78 116/82 116/82   Pulse: (!) 127 (!) 123 (!) 121 (!) 120  Resp: (!) 23 (!) 29 (!) 35 (!) 39  Temp:      TempSrc:      SpO2: 93% 93% 94% 96%  Weight:      Height:       CBC:  Recent Labs  Lab 07/14/19 0635 07/14/19 0635 07/15/19 0438 07/15/19 0544  WBC 13.4*  --  11.9*  --   HGB 13.7   < > 12.5* 12.2*  HCT 45.1   < > 41.3 36.0*  MCV 96.0  --  95.4  --   PLT 155  --  151  --    < > = values in this interval not displayed.   Basic Metabolic Panel:  Recent Labs  Lab 07/14/19 0635 07/14/19 0635 07/15/19 0438 07/15/19 0544  NA 158*   < > 153* 154*  K 3.6   < > 3.6 3.5  CL 117*  --  119*  --   CO2 28  --  21*  --   GLUCOSE 122*  --  153*  --   BUN 48*  --  71*  --   CREATININE 1.72*  --  1.87*  --   CALCIUM 9.3  --  9.3  --   MG 3.0*  --  2.8*  --   PHOS 3.5  --  4.2  --    < > = values in this interval not displayed.    IMAGING past 24 hours DG CHEST PORT 1 VIEW  Result Date: 07/15/2019 CLINICAL DATA:  Respiratory failure EXAM: PORTABLE CHEST 1 VIEW COMPARISON:  Jul 14, 2019 FINDINGS: Tracheostomy catheter tip is 6.5 cm above the carina. Feeding tube tip is below the diaphragm. No pneumothorax. There is atelectatic change in the lung bases. There is a small right pleural effusion. No new opacity evident.  Heart size and pulmonary vascularity are normal. No adenopathy. No bone lesions. IMPRESSION: Tube positions as described without pneumothorax. Stable bibasilar atelectasis a small right pleural effusion. No new opacity evident. Stable cardiac silhouette Electronically Signed   By: Lowella Grip III M.D.   On: 07/15/2019 07:55    PHYSICAL EXAM      General -mildly obese middle-aged Caucasian male, i s/p tracheostomy on ventilatory support  Ophthalmologic - fundi not visualized due to noncooperation.  Cardiovascular - Regular rate and rhythm.  Neuro -s/p tracheostomy.  Off sedation, eyes closed but easily opens and only occasionally following midline commands.   left gaze preference position, not blinking to visual threat bilaterally, doll's eyes present, PERRL. Corneal reflex weak on the right and brisk on the left, gag and cough present. Breathing over the vent.  Facial symmetry not able to test due to ET tube.  Tongue protrusion not cooperative.  Possibly intermittent following commands left hand.  Spontaneous and purposeful movement left UE 2/5 and LE 2/5. However, right UE and LE plegic. DTR 1+ and no babinski. Sensation, coordination and gait not tested.    ASSESSMENT/PLAN Lee Hooper is a 49 y.o. male with history of COPD, ongoing tobacco use, and psoriasis, presenting with dense LMCA syndrome. Attempt for Left ICA and Left M1 thrombectomy was done emergently with TICI 2b flow. Unfortunately, the pt had post procedure hemorrhage and reocclusion of the vessels. F/u imaging suggested early edema and pt underwent prophylactic decompressive hemicraniectomy.  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  Stroke etiology unknown -  given DVT requiring eventual AC and high mRS, will not do further embolic workup  Code Stroke CT head showed early hypodensity in nearly entire LMCA. ASPECTS 6-7 per notes.  CTA head & neck occlusion cervical ICA on the left. This is most likely due to dissection. The patient has minimal atherosclerotic disease.  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  There is poor collateral circulation on the left.   CTP CBF large core volume of 266 mL, perfusion volume 313 mL, mismatch volume 47 mL.  ASPECTS using CTP by Dr. Chestine Spore 6 or 7.   IR - TICI2c revascularization  MRI  Restricted diffusion throughout the majority of the left MCA and ACA vascular territories consistent with acute ischemia.  MRA - reocclusion of the intracranial left internal carotid artery. No flow related signal is seen within the left middle cerebral artery.  CT Head 07/04/19 - Slightly increased rightward midline shift with a small amount of herniation beneath the anterior falx. Otherwise unchanged examination.  CT repeat 5/6 stable infarcts and SAH but increasing edema w/ midline shift now 11mm and herniation through crani site  2D Echo - EF 55 - 60%. No cardiac source of emboli identified.   LDL 45  HgbA1c 5.5  subq heparin for VTE prophylaxis  No antithrombotic prior to admission, no antithrombotics d/t hemorrhagic conversion, now on aspirin 81. Consider AC in 2 mos  Therapy recommendations:  SNF  Disposition:  Pending - LTACH consulted however no insurance  Respiratory failure s/p Trach Trach wound  Intubated on vent  CCM on board  CXR 07/03/19- New consolidation in the right lower lobe and left infrahilar region concerning for pneumonia. Possible small layering right effusion.  CXR 5/3 mild pulm edema w/ atx vs infiltrate/aspiration  On cefepime 4/30>>5/2 for pneumonia  Augmentin 5/2>>5/4  Unasyn 5/5>>5.6 lasix  Trach placed  5/8 per CCM  Fever and elevated white count and tachycardia hence restarted on antibiotics on 07/14/2019  CPAP trials underway  WOC consulted  COPD exacerbation, resolved  On prednisone, ended 5/5  Cerebral edema Hypernatremia   MRI - Restricted diffusion throughout the majority of the left MCA and ACA vascular territories consistent with acute ischemia.  CT Head 07/04/19 - Slightly increased rightward midline shift with a small amount of herniation beneath the anterior falx. Otherwise unchanged examination.  S/p decompressive left hemicraniectomy  4/28 Dr Franky Macho  CT 5/6 - stable infarcts and SAH but increasing edema w/ midline shift now 71mm and herniation through crani site  On keppra. Plan 2 wk course per NSG  Off 3%. On D5NS @ 64  Okay to allow Na gradually normalize  Na 154  Free water  Crani sutures out  Sepsis  Fever w/ leukocytosis HCAP  Tmax 101.4  Leukocytosis - 11.9  UA neg  Sputum moraxella, H. flu   On cefepime 4/30>>5/2; Augmentin 5/2>>5/4, Unasyn 5/5>>5/6   Blood culture 5/2 no growth  Blood cultures 5/7 - no growth   Resp cultures 5/7 - no growth  CXR 5/9 - streaky BLL opacilites atx/consolication/PNA. Possible R pleural effusion  CXR 5/10 w/ atx vs infiltrate  Sputum cx 5/11 reincubated  Blood Cx 5/11 no growth < 24h  Vanc 5/11>>, Cefepime 5/11>>  Bilateral Lower Ext DVT, extensive Possible PE given tachycardia  Venous US 07/11/2019 DVT + R common femoral, SF jxn, R femoral, R proximal profunda, R popliteal, R posterior tib & R peroneal veins. DVT + L common femoral, SF jxn, L femoral, L proximal profund veins.  IVC filter placed 5/9  On aspirin 81 now; consider AC in 2 mos   Hypertension Tachycardia  Home meds:  None  Current meds: none scheduled  BP On the low side   SBP goal < 160 due to hemorrhagic conversion . Long-term BP goal normotensive . Metoprolol prn but watch low BP  Dysphagia   NPO  Coretrak 5/7  On  TF, free water  SLP on board  PEG - consulted trauma today  Ileus, improved  abd distension w/ decreased BS  KUB 5/2 distended colonic loop  Miralax   Flexiseal following laxatives  On reglan, laxatives on hold, decrease fentanyl    Coretrak 5/7  TF advance to goal  Tobacco abuse  Current smoker  Smoking cessation counseling will be provided if able  Other Stroke Risk Factors  Obesity, Body mass index is 32.44 kg/m., recommend weight loss, diet and exercise as appropriate   Likely undiagnosed obstructive sleep apnea based on body habitus  Other Active Problems  Code Status - Full code  Hypokalemia - resolved  Thrombocytopenia - resolved  Agitation. Wrist restraint  Hypomagnesemia - 2.8  ARF. Cre rising 1.72->1.87. Holding diuresis. Free water. Renal US pending. Urine studies pending   Hospital day # 14 Patient remains neurologically unchanged.  Vancomycin and cefepime started yesterday and fever and white count are coming down and he is less tachycardic however his renal function is worsening.  Suspect prerenal azotemia.  Discussed with critical care MD will start some IV hydration.  No family available at the bedside for discussion.  Discussed with Dr. Bedelia Person  from trauma team to schedule PEG tube in the next couple of days.  Discussed with Dr. Briant Sites critical care MD  This patient is critically ill and at significant risk of neurological worsening, death and care requires constant monitoring of vital signs, hemodynamics,respiratory and cardiac monitoring, extensive review of multiple databases, frequent neurological assessment, discussion with family, other specialists and medical decision making of high complexity.I have made any additions or clarifications directly to the above note.This critical care time does not reflect procedure time, or teaching time or supervisory time of PA/NP/Med Resident etc but could involve care discussion time. I spent 35  minutes of neurocritical care time  in the care of  this patient.   Delia Heady, MD   To contact Stroke Continuity provider, please refer to [email or web address]. After hours, contact General Neurology

## 2019-07-15 NOTE — Consult Note (Signed)
WOC Nurse Consult Note: Patient receiving care in [facility] [phone].  Assisted with site assessment by primary RN. Reason for Consult: trach wound Wound type: MDRPI, DTPI - evolving under the right edge of the lower trach flange close to the sutures. Pressure Injury POA: No Measurement: approximately 1.0 cm x 1.2 cm Wound bed: maroon Drainage (amount, consistency, odor) none from the actual area, trach secretions present however. Periwound: intact Dressing procedure/placement/frequency: Try to insert a foam dressing under the lower edge of the trach face plate, especially close to the sutures on the patient's right side.  There is a MDRPI, maroon deep tissue pressure injury there.  Change when soiled. Monitor the wound area(s) for worsening of condition such as: Signs/symptoms of infection,  Increase in size,  Development of or worsening of odor, Development of pain, or increased pain at the affected locations.  Notify the medical team if any of these develop.  Thank you for the consult.  Discussed plan of care with the bedside nurse.  WOC nurse will not follow at this time.  Please re-consult the WOC team if needed.  Helmut Muster, RN, MSN, CWOCN, CNS-BC, pager [phone]

## 2019-07-15 NOTE — Progress Notes (Signed)
NAME:  Lee Hooper, MRN:  [id number], DOB:  Jul 08, 1970, LOS: 14 ADMISSION DATE:  07/01/2019, CONSULTATION DATE:  07/01/19 REFERRING MD:  Aroor MD, CHIEF COMPLAINT:  CVA    Brief History   Lee Hooper is a 49 yo M who presented as a code stroke after being found aphasic and nonverbal by his wife. CT/CTA studies showed left ICA/MCA thrombosis and pt was taken for IR thrombectomy for left ICA and left M1 occlusions. Repeat head CT was obtained following the procedure out of concern for possible hemorrhage and showed moderate basal ganglia hemorrhage vs stroke. Repeat MRI revealed that the ICA and MCA re-occluded with change in FLAIR sequence excluding him from repeat thrombectomy. He underwent a L hemicraniectomy. He was intubated for airway protection.  Past Medical History  Psoriasis  COPD  Tobacco Use  Significant Hospital Events   4/28: Admit; L ICA/MCA Thrombectomy with re-occlusion --> left hemicraniectomy, intubated for acute hypoxic respiratory failure  5/3: NAE. Was taken off precedex yesterday and pt opened his eyes. Prednisone 50 mg qd was begun. He had a persistent fever around 38.2, HR 110s, BP 140s/60s. Pt required increased O2 with secretions; CXR showed mild edema with small effusions c/f aspiration vs opacification. Blood culture x2 and urine culture collected, NGTD. ABG pending. Currently satting 92% on PRVC FiO2 80, PEEP 10, Plateau 22. WBC declined to 10.5 from 11.1, Na 151 from 156. Tube feeds were held d/t distended colonic loop in RLQ on KUB, to be continued today. Thrombocytopenia improved to 93 from 81.  5/4: Patient remains intubated with spontaneous movement of left arm and leg.  5/5: NAE. Pt remains intubated on PRN fetanyl. Will open eyes and spontaneously move left leg and arm.  5/6: NAE overnight. Intubated with no sedation. 5/7: NAE. Marland Kitchen Completed Unasyn yesterday. VSS, spO2 remains in low 90s the majority of the time on 620/40%/+5. Morning labs significant  for HCO3 of 19. Otherwise unremarkable.  5/8: Ongoing fever.  Obtained ultrasound, positive for lower extremity DVT bilaterally.  Tracheostomy placed at bedside.,  Did have some SVT this responded to treating fever and rate control. 5/9: IVC filter placed 5/11: no acute events overnight. Pt with similar exam as previous however tachycardic and febrile (tmax102.7) with a bit increased wbc and new R infiltrate noted in base. Pan cx and start empiric abx.  Consults:  IR PCCM  Neuro (Primary) Neurosurgery  Procedures:  4/28 IR - L ICA/MCA Thrombectomy 4/28 L hemicraniectomy 4/28 Intubation 5/8  Trach   Significant Diagnostic Tests:  4/28 Admit CT Head Code Stroke:  Hyperdense left ICA and MCA compatible with acute thrombus. No acute infarct or hemorrhage.  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 The patient has minimal atherosclerotic disease. The left internal carotid artery is occluded through the terminus and extending in the left M1 M2 and M3 branches. There is poor collateral circulation on the left. There is a large territory infarct involving the left hemisphere. Infarct volume 266 mL.  4/28 Admit MR Brain:  Restricted diffusion throughout much of the left MCA vascular territory consistent with acute ischemia. Restricted diffusion consistent with acute ischemia is also present within the paramedian left frontoparietal lobes, although this is less well assessed due to the degree of motion degradation at the level of the vertex. There is little if any corresponding T2/FLAIR hyperintensity at these sites. No significant mass effect. No midline shift.  4/28 Post Thrombectomy CT Head: : 1. 3.0 x 2.6 x 3.9 cm region of hyperdensity centered within the left basal ganglia, left subinsular region and inferomedial left temporal lobe likely reflecting a combination of parenchymal hematoma and  contrast staining.  2. Edema with loss of gray-white differentiation within the left basal ganglia, left insula and anterior left temporal lobe, likely acute infarction. Subtle changes of acute infarction are also suspected within the paramedian left frontal lobe ACA vascular territory. 3. Scattered small-volume subarachnoid hemorrhage along the left cerebral hemisphere. 4. Regional mass effect with effacement of the left lateral ventricle temporal horn. No midline shift.  4/28 Post Thrombectomy MR Brain:  1. Restricted diffusion throughout the majority of the left MCA and ACA vascular territories consistent with acute ischemia. 2. No significant mass effect at this time. No midline shift. 3. The acute parenchymal hemorrhage and/or contrast staining centered within left basal ganglia and inferomedial left temporal lobe on prior head CT does not appear significantly changed. Based on the MR appearance, it is suspected that a significant component of this previously demonstrated hyperdensity reflects contrast staining. 4. Redemonstrated small volume subarachnoid hemorrhage overlying the left cerebral hemisphere. Small volume subarachnoid hemorrhage is also questioned along the right cerebral hemisphere posteriorly.  4/29 Post Hemicraniectomy CT head:  Complete left ACA/MCA territory infarction with swollen brain bulging through craniectomy defect. No midline shift or entrapment. Petechial hemorrhage at left basal ganglia. Extraaxial hemorrhage along surface of the infarct.   5/1 Head CT > Slightly increased rightward midline shift with a small amount of herniation beneath the anterior falx. Otherwise unchanged Examination.  5/6 Head CT: Left ACA and MCA territory infarct with unchanged degree of subarachnoid hemorrhage. Swelling has progressed with greater herniation through the craniectomy defect and 5 mm of midline shift.  5/8: Bilateral acute DVT involving the right common femoral vein  right proximal profundal vein and right popliteal vein also left DVT involving same vessels  Micro Data:  4/28: SARS-CoV-2/Influenza A/Influenza B PCR: Negative  4/30: Respiratory culture: Abundant Moraxella Catarrhalis, Haemophilus Influenzae, beta lactamase positive  5/2: Urine culture: NGTD  5/2: BC x2: NGTD 5/7 resp >> H flu 5/7 bld >> ng 5/11 BCx2 >> 5/11 trach asp >>  Antimicrobials:  4/30 - 5/2: Cefepime 5/2 - 5/4: Augmentin  5/5 - 5/6 unasyn   Vanc 5/11 >> Cefepime 5/11 >>  Interim history/subjective:  tmax 101.2 WBC 13.4 -> 11.9 Doing well this morning on PSV 8/5, 40%, ongoing moderate tan secretions Na 153 sCr 1.72-> 1.87 tententive plan for PEG tomorrow  Objective   Blood pressure 105/75, pulse (!) 124, temperature 97.7 F (36.5 C), temperature source Axillary, resp. rate (!) 27, height 6' (1.829 m), weight 108.5 kg, SpO2 93 %.    Vent Mode: PSV;CPAP FiO2 (%):  [40 %-50 %] 40 % Set Rate:  [16 bmp] 16 bmp Vt Set:  [620 mL] 620 mL PEEP:  [5 cmH20] 5 cmH20 Pressure Support:  [8 cmH20] 8 cmH20 Plateau Pressure:  [16 cmH20-21 cmH20] 16 cmH20   Intake/Output Summary (Last 24 hours) at 07/15/2019 0925 Last data filed at 07/15/2019 0900 Gross per 24 hour  Intake 3680.07 ml  Output 1625 ml  Net 2055.07 ml   Filed Weights   07/08/19 0427 07/09/19 0436 07/10/19 0500  Weight: 112 kg 109.2 kg 108.5 kg    Examination: General:  Adult male lying in bed in NAD HEENT: MM pink/moist, pupils 4/reactive, anicertic, crani incision healing, midline trach- sutured, left nare cortrak Neuro: Awakens  to verbal, opens eyes, moves left spontaneously , right remains flaccid, seems to follow simple commands on right vs just movement CV: rr, ST, no mumur PULM:  Doing well on PSV 8/5, coarse throughout- mod tan secretions GI: soft, bs+, flexi-seal, condom cath Extremities: warm/dry, generalized edema   CXR 5/12 stable lines/ tubes, stable bibasilar atelectasias, RLL opacity    Resolved Hospital Problem list   treated HCAP with Moraxella and Haemophilus influenza, these were beta-lactamase positive, antibiotics were completed 5/6 COPD exacerbation Ileus  Assessment & Plan:  49 yo with h/o COPD and tobacco use presented with large L MCA stroke s/p IR thrombectomy and subsequent hemorrhage s/p hemicraniectomy.   Large Left ICA/MCA CVA s/p IR thrombectomy and left decompressive hemicraniectomy  significant cerebral edema: Repeat head CT was obtained following the procedure out of concern for possible hemorrhage and showed moderate basal ganglia hemorrhage vs stroke. Repeat MRI revealed that the ICA and MCA re-occluded . Neurology primary team and managing.   Plan Per Neurology and NSGY Continue serial neuro checks Aggressive PT/ OT Peg placement scheduled for 5/13 by CCS Continue ASA   Acute hypoxic respiratory failure requiring mechanical ventilation S/p Tracheostomy 5/8 Underlying possible COPD New R basilar infiltrate and increased secretions 5/11 Plan Continue full MV support, PRVC as rest mode, daily SBT- goal is to trach collar Titrate oxygen as able for sats > 94% Continue duonebs, pulmicort VAP bundle Ongoing trach care abx as below, following trach culture   SIRS - new R basilar infiltrate and increased secretions 5/11 - acute b/l DVT 5/8 Plan Follow pan cultures  Continue vanc and cefepime  Remains hemodynamically stable, still tachypneic / tachycardia IVF bolus now Strict I/Os, trend BMP Trend WBC / fever curve   Acute bilateral lower extremity DVT Status post retrievable IVC filter 5/9  Plan  PE is in the differential, but he is not a candidate for full anticoagulation right now and further investigation will not change treatment plan  SVT, rate now sinus tach ? Possible PE contributing to rate>> IVC Filter placed Plan Tele monitoring As needed metoprolol  AKI Hypernatremia- allow gradual normalization  Plan IVF bolus  now Increase free water bolus, 200 ml free water q 2hr Trend BMP/ strict I/Os  Ileus: Resolving.   Plan TFs at goal Goal K > 4, Mag > 2  Best practice:  Diet: Tube Feeds  Pain/Anxiety/Delirium protocol (if indicated): minimize, fentanyl prn VAP protocol (if indicated): yes DVT prophylaxis: IVC filter GI prophylaxis: Protonix Glucose control: trend on BMP Mobility: PT/ OT Code Status: FULL Family Communication: pending Disposition: ICU  > plan for LTAC, however issue with placement given no insurance    CCT:  35 mins  Posey Boyer, MSN, AGACNP-BC [facility] 07/15/2019, 9:25 AM  See Loretha Stapler for personal pager PCCM on call pager [phone]

## 2019-07-15 NOTE — Consult Note (Signed)
[facility] Surgery Consult Note  Lee Hooper 07-09-1970  [id number].    Requesting MD: Leonie Man Chief Complaint/Reason for Consult: PEG placement  HPI:  Patient is a 49 year old male who presented as a code stroke 4/28 after being found aphasic and nonverbal by his wife. 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 Patient underwent IR thrombectomy for left ICA and left MCA occlusions. Follow up head CT showed moderate basal ganglia hemorrhage vs CVA. Repeat MRI showed ICA/MCA re-occlusion and patient was not a candidate for repeat thrombectomy. He underwent left hemicraniectomy and was intubated for acute respiratory failure. Patient underwent tracheostomy at the bedside 5/8. Fever workup revealed BL LE DVTs and IVC filter was placed 5/9.    PMH otherwise significant for COPD, tobacco abuse, psoriasis. NKDA. No known past abdominal surgery.  ROS: Review of Systems  Unable to perform ROS: Mental acuity    History reviewed. No pertinent family history.  History reviewed. No pertinent past medical history.  Past Surgical History:  Procedure Laterality Date  . CRANIOTOMY Left 07/01/2019   Procedure: Left Hemicraniectomy;  Surgeon: Ashok Pall, MD;  Location: [place];  Service: Neurosurgery;  Laterality: Left;  . IR CT HEAD LTD  07/01/2019  . IR IVC FILTER PLMT / S&I /IMG GUID/MOD SED  07/12/2019  . IR PERCUTANEOUS ART THROMBECTOMY/INFUSION INTRACRANIAL INC DIAG ANGIO  07/01/2019  . RADIOLOGY WITH ANESTHESIA N/A 07/01/2019   Procedure: IR WITH ANESTHESIA;  Surgeon: Radiologist, Medication, MD;  Location: [place];  Service: Radiology;  Laterality: N/A;    Social History:  reports that he has been smoking. He does not have any smokeless tobacco history on file. No history on file for alcohol and drug.  Allergies: No Known Allergies  Medications Prior to Admission  Medication Sig Dispense Refill  . Caffeine-Magnesium Salicylate (DIUREX PO) Take 1 tablet by mouth daily as needed (fluid).     . naproxen sodium (ALEVE) 220 MG tablet Take 220 mg by mouth 2 (two) times daily.      Blood pressure 110/68, pulse (!) 104, temperature 99.6 F (37.6 C), temperature source Axillary, resp. rate (!) 36, height 6' (1.829 m), weight 108.5 kg, SpO2 95 %. Physical Exam:  General: pleasant, WD, WN white male who is laying in bed in NAD HEENT: head is normocephalic, atraumatic.  Sclera are noninjected.  PERRL.  Ears and nose without any masses or lesions.  Mouth is pink and moist Heart: regular, rate, and rhythm.  Normal s1,s2. No obvious murmurs, gallops, or rubs noted.  Palpable radial and pedal pulses bilaterally Lungs: CTAB, no wheezes, rhonchi, or rales noted.  Respiratory effort nonlabored Abd: soft, NT, ND, +BS, no masses, hernias, or organomegaly MS: all 4 extremities are symmetrical with no cyanosis, clubbing, or edema. Skin: warm and dry with no masses, lesions, or rashes Neuro: Cranial nerves 2-12 grossly intact, speech is normal Psych: A&Ox3 with an appropriate affect.   Results for orders placed or performed during the hospital encounter of 07/01/19 (from the past 48 hour(s))  Glucose, capillary     Status: Abnormal   Collection Time: 07/13/19  4:04 PM  Result Value Ref Range   Glucose-Capillary 119 (H) 70 - 99 mg/dL    Comment: Glucose reference range applies only to samples taken after fasting for at least 8 hours.   Comment 1 Notify RN    Comment 2 Document in Chart   Glucose, capillary     Status: Abnormal   Collection Time: 07/13/19  7:38 PM  Result  Value Ref Range   Glucose-Capillary 137 (H) 70 - 99 mg/dL    Comment: Glucose reference range applies only to samples taken after fasting for at least 8 hours.  Glucose, capillary     Status: Abnormal   Collection Time: 07/13/19 11:14 PM  Result Value Ref Range   Glucose-Capillary 110 (H) 70 - 99 mg/dL    Comment: Glucose reference range applies only to samples taken after fasting for at least 8 hours.  Glucose, capillary      Status: Abnormal   Collection Time: 07/14/19  3:22 AM  Result Value Ref Range   Glucose-Capillary 125 (H) 70 - 99 mg/dL    Comment: Glucose reference range applies only to samples taken after fasting for at least 8 hours.  Comprehensive metabolic panel     Status: Abnormal   Collection Time: 07/14/19  6:35 AM  Result Value Ref Range   Sodium 158 (H) 135 - 145 mmol/L   Potassium 3.6 3.5 - 5.1 mmol/L   Chloride 117 (H) 98 - 111 mmol/L   CO2 28 22 - 32 mmol/L   Glucose, Bld 122 (H) 70 - 99 mg/dL    Comment: Glucose reference range applies only to samples taken after fasting for at least 8 hours.   BUN 48 (H) 6 - 20 mg/dL   Creatinine, Ser 2.95 (H) 0.61 - 1.24 mg/dL   Calcium 9.3 8.9 - 28.4 mg/dL   Total Protein 6.9 6.5 - 8.1 g/dL   Albumin 2.3 (L) 3.5 - 5.0 g/dL   AST 73 (H) 15 - 41 U/L   ALT 126 (H) 0 - 44 U/L   Alkaline Phosphatase 74 38 - 126 U/L   Total Bilirubin 0.7 0.3 - 1.2 mg/dL   GFR calc non Af Amer 45 (L) >60 mL/min   GFR calc Af Amer 53 (L) >60 mL/min   Anion gap 13 5 - 15    Comment: Performed at [facility], [address], [place], [place] [address]  CBC     Status: Abnormal   Collection Time: 07/14/19  6:35 AM  Result Value Ref Range   WBC 13.4 (H) 4.0 - 10.5 K/uL   RBC 4.70 4.22 - 5.81 MIL/uL   Hemoglobin 13.7 13.0 - 17.0 g/dL   HCT 01.0 27.2 - 53.6 %   MCV 96.0 80.0 - 100.0 fL   MCH 29.1 26.0 - 34.0 pg   MCHC 30.4 30.0 - 36.0 g/dL   RDW 64.4 03.4 - 74.2 %   Platelets 155 150 - 400 K/uL    Comment: REPEATED TO VERIFY   nRBC 0.1 0.0 - 0.2 %    Comment: Performed at [facility], [address] [place], [place] [address]  Magnesium     Status: Abnormal   Collection Time: 07/14/19  6:35 AM  Result Value Ref Range   Magnesium 3.0 (H) 1.7 - 2.4 mg/dL    Comment: Performed at [name], [address], [place], [place] [address]  Phosphorus     Status: None   Collection Time: 07/14/19  6:35 AM  Result Value Ref Range   Phosphorus  3.5 2.5 - 4.6 mg/dL    Comment: Performed at [facility], [address], [place], [place] [address]  Glucose, capillary     Status: Abnormal   Collection Time: 07/14/19  7:37 AM  Result Value Ref Range   Glucose-Capillary 126 (H) 70 - 99 mg/dL    Comment: Glucose  reference range applies only to samples taken after fasting for at least 8 hours.   Comment 1 Notify RN    Comment 2 Document in Chart   Culture, blood (routine x 2)     Status: None (Preliminary result)   Collection Time: 07/14/19 10:44 AM   Specimen: BLOOD  Result Value Ref Range   Specimen Description BLOOD LEFT ANTECUBITAL    Special Requests      BOTTLES DRAWN AEROBIC ONLY Blood Culture adequate volume   Culture      NO GROWTH < 24 HOURS Performed at [facility], [address] [place], [place] [address]    Report Status PENDING   Culture, blood (routine x 2)     Status: None (Preliminary result)   Collection Time: 07/14/19 10:50 AM   Specimen: BLOOD  Result Value Ref Range   Specimen Description BLOOD RIGHT ANTECUBITAL    Special Requests      BOTTLES DRAWN AEROBIC ONLY Blood Culture results may not be optimal due to an inadequate volume of blood received in culture bottles   Culture      NO GROWTH < 24 HOURS Performed at [facility], [address] [place], [place] [address]    Report Status PENDING   Culture, respiratory (non-expectorated)     Status: None (Preliminary result)   Collection Time: 07/14/19 10:58 AM   Specimen: Tracheal Aspirate; Respiratory  Result Value Ref Range   Specimen Description TRACHEAL ASPIRATE    Special Requests Normal    Gram Stain      MODERATE WBC PRESENT,BOTH PMN AND MONONUCLEAR ABUNDANT GRAM NEGATIVE RODS FEW GRAM POSITIVE COCCI    Culture      CULTURE REINCUBATED FOR BETTER GROWTH Performed at [facility], [address] [place], [place] [address]    Report Status PENDING   Urinalysis, Routine w reflex microscopic     Status: Abnormal    Collection Time: 07/14/19 10:58 AM  Result Value Ref Range   Color, Urine AMBER (A) YELLOW    Comment: BIOCHEMICALS MAY BE AFFECTED BY COLOR   APPearance HAZY (A) CLEAR   Specific Gravity, Urine 1.029 1.005 - 1.030   pH 5.0 5.0 - 8.0   Glucose, UA NEGATIVE NEGATIVE mg/dL   Hgb urine dipstick NEGATIVE NEGATIVE   Bilirubin Urine NEGATIVE NEGATIVE   Ketones, ur NEGATIVE NEGATIVE mg/dL   Protein, ur 30 (A) NEGATIVE mg/dL   Nitrite NEGATIVE NEGATIVE   Leukocytes,Ua NEGATIVE NEGATIVE   RBC / HPF 0-5 0 - 5 RBC/hpf   WBC, UA 0-5 0 - 5 WBC/hpf   Bacteria, UA NONE SEEN NONE SEEN   Squamous Epithelial / LPF 0-5 0 - 5   Mucus PRESENT    Granular Casts, UA PRESENT     Comment: Performed at [facility], [address] [place], [place] [address]  Glucose, capillary     Status: Abnormal   Collection Time: 07/14/19 11:38 AM  Result Value Ref Range   Glucose-Capillary 135 (H) 70 - 99 mg/dL    Comment: Glucose reference range applies only to samples taken after fasting for at least 8 hours.   Comment 1 Notify RN    Comment 2 Document in Chart   Glucose, capillary     Status: Abnormal   Collection Time: 07/14/19  3:18 PM  Result Value Ref Range   Glucose-Capillary 122 (H) 70 - 99 mg/dL    Comment: Glucose reference range applies only to samples  taken after fasting for at least 8 hours.   Comment 1 Notify RN    Comment 2 Document in Chart   Glucose, capillary     Status: Abnormal   Collection Time: 07/14/19  7:33 PM  Result Value Ref Range   Glucose-Capillary 118 (H) 70 - 99 mg/dL    Comment: Glucose reference range applies only to samples taken after fasting for at least 8 hours.  Glucose, capillary     Status: Abnormal   Collection Time: 07/14/19 11:17 PM  Result Value Ref Range   Glucose-Capillary 138 (H) 70 - 99 mg/dL    Comment: Glucose reference range applies only to samples taken after fasting for at least 8 hours.  Glucose, capillary     Status: Abnormal   Collection Time:  07/15/19  3:28 AM  Result Value Ref Range   Glucose-Capillary 110 (H) 70 - 99 mg/dL    Comment: Glucose reference range applies only to samples taken after fasting for at least 8 hours.  Comprehensive metabolic panel     Status: Abnormal   Collection Time: 07/15/19  4:38 AM  Result Value Ref Range   Sodium 153 (H) 135 - 145 mmol/L   Potassium 3.6 3.5 - 5.1 mmol/L   Chloride 119 (H) 98 - 111 mmol/L   CO2 21 (L) 22 - 32 mmol/L   Glucose, Bld 153 (H) 70 - 99 mg/dL    Comment: Glucose reference range applies only to samples taken after fasting for at least 8 hours.   BUN 71 (H) 6 - 20 mg/dL   Creatinine, Ser 1.61 (H) 0.61 - 1.24 mg/dL   Calcium 9.3 8.9 - 09.6 mg/dL   Total Protein 6.6 6.5 - 8.1 g/dL   Albumin 2.1 (L) 3.5 - 5.0 g/dL   AST 58 (H) 15 - 41 U/L   ALT 118 (H) 0 - 44 U/L   Alkaline Phosphatase 70 38 - 126 U/L   Total Bilirubin 0.8 0.3 - 1.2 mg/dL   GFR calc non Af Amer 41 (L) >60 mL/min   GFR calc Af Amer 48 (L) >60 mL/min   Anion gap 13 5 - 15    Comment: Performed at [facility], [address] [place], [place] [address]  CBC     Status: Abnormal   Collection Time: 07/15/19  4:38 AM  Result Value Ref Range   WBC 11.9 (H) 4.0 - 10.5 K/uL   RBC 4.33 4.22 - 5.81 MIL/uL   Hemoglobin 12.5 (L) 13.0 - 17.0 g/dL   HCT 98.1 19.1 - 47.8 %   MCV 95.4 80.0 - 100.0 fL   MCH 28.9 26.0 - 34.0 pg   MCHC 30.3 30.0 - 36.0 g/dL   RDW 29.5 62.1 - 30.8 %   Platelets 151 150 - 400 K/uL   nRBC 0.3 (H) 0.0 - 0.2 %    Comment: Performed at [facility], [address] [place], [place] [address]  Magnesium     Status: Abnormal   Collection Time: 07/15/19  4:38 AM  Result Value Ref Range   Magnesium 2.8 (H) 1.7 - 2.4 mg/dL    Comment: Performed at [facility], [address] [place], [place] [address]  Phosphorus     Status: None   Collection Time: 07/15/19  4:38 AM  Result Value Ref Range   Phosphorus 4.2 2.5 - 4.6 mg/dL    Comment: Performed at [facility], [address], [place],  [place] [address]  I-STAT 7, (LYTES, BLD GAS, ICA, H+H)     Status: Abnormal   Collection Time: 07/15/19  5:44 AM  Result Value Ref Range   pH, Arterial 7.420 7.350 - 7.450   pCO2 arterial 37.8 32.0 - 48.0 mmHg   pO2, Arterial 102 83.0 - 108.0 mmHg   Bicarbonate 24.4 20.0 - 28.0 mmol/L   TCO2 26 22 - 32 mmol/L   O2 Saturation 98.0 %   Acid-Base Excess 0.0 0.0 - 2.0 mmol/L   Sodium 154 (H) 135 - 145 mmol/L   Potassium 3.5 3.5 - 5.1 mmol/L   Calcium, Ion 1.36 1.15 - 1.40 mmol/L   HCT 36.0 (L) 39.0 - 52.0 %   Hemoglobin 12.2 (L) 13.0 - 17.0 g/dL   Patient temperature 78.9 F    Collection site Radial    Drawn by RT    Sample type ARTERIAL   Glucose, capillary     Status: Abnormal   Collection Time: 07/15/19  8:37 AM  Result Value Ref Range   Glucose-Capillary 132 (H) 70 - 99 mg/dL    Comment: Glucose reference range applies only to samples taken after fasting for at least 8 hours.   Comment 1 Notify RN    Comment 2 Document in Chart   Glucose, capillary     Status: Abnormal   Collection Time: 07/15/19 12:12 PM  Result Value Ref Range   Glucose-Capillary 134 (H) 70 - 99 mg/dL    Comment: Glucose reference range applies only to samples taken after fasting for at least 8 hours.   Comment 1 Notify RN    Comment 2 Document in Chart   Sodium, urine, random     Status: None   Collection Time: 07/15/19 12:30 PM  Result Value Ref Range   Sodium, Ur 19 mmol/L    Comment: Performed at [facility], [address], [place], [place] [address]  Protein / creatinine ratio, urine     Status: Abnormal   Collection Time: 07/15/19 12:30 PM  Result Value Ref Range   Creatinine, Urine 99.22 mg/dL   Total Protein, Urine 84 mg/dL    Comment: NO NORMAL RANGE ESTABLISHED FOR THIS TEST   Protein Creatinine Ratio 0.85 (H) 0.00 - 0.15 mg/mg[Cre]    Comment: Performed at [facility], [address], [place], [place] [address]   DG CHEST PORT 1  VIEW  Result Date: 07/15/2019 CLINICAL DATA:  Respiratory failure EXAM: PORTABLE CHEST 1 VIEW COMPARISON:  Jul 14, 2019 FINDINGS: Tracheostomy catheter tip is 6.5 cm above the carina. Feeding tube tip is below the diaphragm. No pneumothorax. There is atelectatic change in the lung bases. There is a small right pleural effusion. No new opacity evident. Heart size and pulmonary vascularity are normal. No adenopathy. No bone lesions. IMPRESSION: Tube positions as described without pneumothorax. Stable bibasilar atelectasis a small right pleural effusion. No new opacity evident. Stable cardiac silhouette Electronically Signed   By: Bretta Bang III M.D.   On: 07/15/2019 07:55   DG CHEST PORT 1 VIEW  Result Date: 07/14/2019 CLINICAL DATA:  Respiratory failure. EXAM: PORTABLE CHEST 1 VIEW COMPARISON:  May 13, 2019. FINDINGS: The heart size and mediastinal contours are within normal limits. Tracheostomy and feeding tubes are unchanged in position. No pneumothorax is visualized. Stable left basilar atelectasis or scarring is noted. Decreased right basilar opacity is noted suggesting improving atelectasis or infiltrate with associated pleural effusion. The visualized skeletal structures are unremarkable. IMPRESSION: Stable support  apparatus. Decreased right basilar opacity is noted suggesting improving atelectasis or infiltrate with associated pleural effusion. Electronically Signed   By: Lupita Raider M.D.   On: 07/14/2019 14:05      Assessment/Plan COPD Tobacco abuse Psoriasis left ICA/MCA thrombosis VDRF s/p tracheostomy  Bilateral LE DVT s/p IVC filter  Consult for PEG placement 25M s/p stroke with prolonged mechanical ventilation s/p tracheostomy 5/8 and expected dysphagia.   Plan for PEG 5/14, posted for bedside with Endo around noon. NPO at midnight the morning of the procedure. Informed consent obtained from the wife, Canyon Lohr after detailed discussion of risks and benefits  including malposition, dislodgement, and inability to see the light reflex requiring conversion to lap assisted or open procedure. All questions answered to her satisfaction.   Diamantina Monks, MD General and Trauma Surgery [facility] Surgery

## 2019-07-16 DIAGNOSIS — J189 Pneumonia, unspecified organism: Secondary | ICD-10-CM

## 2019-07-16 LAB — COMPREHENSIVE METABOLIC PANEL
ALT: 118 U/L — ABNORMAL HIGH (ref 0–44)
AST: 80 U/L — ABNORMAL HIGH (ref 15–41)
Albumin: 2 g/dL — ABNORMAL LOW (ref 3.5–5.0)
Alkaline Phosphatase: 63 U/L (ref 38–126)
Anion gap: 9 (ref 5–15)
BUN: 48 mg/dL — ABNORMAL HIGH (ref 6–20)
CO2: 24 mmol/L (ref 22–32)
Calcium: 9.4 mg/dL (ref 8.9–10.3)
Chloride: 121 mmol/L — ABNORMAL HIGH (ref 98–111)
Creatinine, Ser: 1.2 mg/dL (ref 0.61–1.24)
GFR calc Af Amer: >60 mL/min (ref >60)
GFR calc non Af Amer: >60 mL/min (ref >60)
Glucose, Bld: 121 mg/dL — ABNORMAL HIGH (ref 70–99)
Potassium: 4.4 mmol/L (ref 3.5–5.1)
Sodium: 154 mmol/L — ABNORMAL HIGH (ref 135–145)
Total Bilirubin: 0.9 mg/dL (ref 0.3–1.2)
Total Protein: 6.4 g/dL — ABNORMAL LOW (ref 6.5–8.1)

## 2019-07-16 LAB — GLUCOSE, CAPILLARY
Glucose-Capillary: 115 mg/dL — ABNORMAL HIGH (ref 70–99)
Glucose-Capillary: 129 mg/dL — ABNORMAL HIGH (ref 70–99)
Glucose-Capillary: 142 mg/dL — ABNORMAL HIGH (ref 70–99)
Glucose-Capillary: 127 mg/dL — ABNORMAL HIGH (ref 70–99)
Glucose-Capillary: 120 mg/dL — ABNORMAL HIGH (ref 70–99)
Glucose-Capillary: 132 mg/dL — ABNORMAL HIGH (ref 70–99)

## 2019-07-16 LAB — CBC
HCT: 38.8 % — ABNORMAL LOW (ref 39.0–52.0)
Hemoglobin: 11.8 g/dL — ABNORMAL LOW (ref 13.0–17.0)
MCH: 28.9 pg (ref 26.0–34.0)
MCHC: 30.4 g/dL (ref 30.0–36.0)
MCV: 94.9 fL (ref 80.0–100.0)
Platelets: 135 10*3/uL — ABNORMAL LOW (ref 150–400)
RBC: 4.09 MIL/uL — ABNORMAL LOW (ref 4.22–5.81)
RDW: 15.4 % (ref 11.5–15.5)
WBC: 11 10*3/uL — ABNORMAL HIGH (ref 4.0–10.5)
nRBC: 0.4 % — ABNORMAL HIGH (ref 0.0–0.2)

## 2019-07-16 LAB — MAGNESIUM: Magnesium: 2.7 mg/dL — ABNORMAL HIGH (ref 1.7–2.4)

## 2019-07-16 LAB — UREA NITROGEN, URINE: Urea Nitrogen, Ur: 1837 mg/dL

## 2019-07-16 LAB — PHOSPHORUS: Phosphorus: 2.9 mg/dL (ref 2.5–4.6)

## 2019-07-16 MED ORDER — DEXTROSE 5 % IV SOLN: INTRAVENOUS | Status: DC

## 2019-07-16 MED ORDER — SODIUM CHLORIDE 0.9 % IV SOLN
2.0000 g | Freq: Three times a day (TID) | INTRAVENOUS | Status: DC
  Administered 2019-07-16 – 2019-07-17 (×3): 2 g via INTRAVENOUS
  Filled 2019-07-16 (×5): qty 2

## 2019-07-16 NOTE — Progress Notes (Signed)
Patient ID: Lee Hooper, male   DOB: May 02, 1970, 49 y.o.   MRN: [id number] BP 112/81 (BP Location: Right Arm)   Pulse 88   Temp 98.6 F (37 C) (Oral)   Resp 17   Ht 6' (1.829 m)   Wt 108.5 kg   SpO2 97%   BMI 32.44 kg/m  Lethargic, opens eyes. Not following commands Wound is clean, dry Remains on trach collar.

## 2019-07-16 NOTE — Progress Notes (Signed)
Patient ID: Lee Hooper, male   DOB: [birth year], 49 y.o.   MRN: [id number] BP 108/83 (BP Location: Right Arm)   Pulse (!) 110   Temp 98.1 F (36.7 C) (Axillary)   Resp (!) 23   Ht 6' (1.829 m)   Wt 108.5 kg   SpO2 93%   BMI 32.44 kg/m  Wound with slight drainage. No signs of infection Neuro is unchanged No new recommendations

## 2019-07-16 NOTE — Progress Notes (Addendum)
NAME:  Lee Hooper, MRN:  [id number], DOB:  10-Nov-1970, LOS: 42 ADMISSION DATE:  07/01/2019, CONSULTATION DATE:  07/01/19 REFERRING MD:  Aroor MD, CHIEF COMPLAINT:  CVA    Brief History   Lee Hooper is a 49 yo M who presented as a code stroke after being found aphasic and nonverbal by his wife. CT/CTA studies showed left ICA/MCA thrombosis and pt was taken for IR thrombectomy for left ICA and left M1 occlusions. Repeat head CT was obtained following the procedure out of concern for possible hemorrhage and showed moderate basal ganglia hemorrhage vs stroke. Repeat MRI revealed that the ICA and MCA re-occluded with change in FLAIR sequence excluding him from repeat thrombectomy. He underwent a L hemicraniectomy. He was intubated for airway protection.  Past Medical History  Psoriasis  COPD  Tobacco Use  Significant Hospital Events   4/28: Admit; L ICA/MCA Thrombectomy with re-occlusion --> left hemicraniectomy, intubated for acute hypoxic respiratory failure  5/3: NAE. Was taken off precedex yesterday and pt opened his eyes. Prednisone 50 mg qd was begun. He had a persistent fever around 38.2, HR 110s, BP 140s/60s. Pt required increased O2 with secretions; CXR showed mild edema with small effusions c/f aspiration vs opacification. Blood culture x2 and urine culture collected, NGTD. ABG pending. Currently satting 92% on PRVC FiO2 80, PEEP 10, Plateau 22. WBC declined to 10.5 from 11.1, Na 151 from 156. Tube feeds were held d/t distended colonic loop in RLQ on KUB, to be continued today. Thrombocytopenia improved to 93 from 81.  5/4: Patient remains intubated with spontaneous movement of left arm and leg.  5/5: NAE. Pt remains intubated on PRN fetanyl. Will open eyes and spontaneously move left leg and arm.  5/6: NAE overnight. Intubated with no sedation. 5/7: NAE. Marland Kitchen Completed Unasyn yesterday. VSS, spO2 remains in low 90s the majority of the time on 620/40%/+5. Morning labs significant  for HCO3 of 19. Otherwise unremarkable.  5/8: Ongoing fever.  Obtained ultrasound, positive for lower extremity DVT bilaterally.  Tracheostomy placed at bedside.,  Did have some SVT this responded to treating fever and rate control. 5/9: IVC filter placed 5/11: no acute events overnight. Pt with similar exam as previous however tachycardic and febrile (tmax102.7) with a bit increased wbc and new R infiltrate noted in base. Pan cx and start empiric abx. 5/12 Tolerated 8 hours of trach collar   Consults:  IR PCCM  Neuro (Primary) Neurosurgery  Procedures:  4/28 IR - L ICA/MCA Thrombectomy 4/28 L hemicraniectomy 4/28 Intubation 5/8  Trach   Significant Diagnostic Tests:  4/28 Admit CT Head Code Stroke:  Hyperdense left ICA and MCA compatible with acute thrombus. No acute infarct or hemorrhage.  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 The patient has minimal atherosclerotic disease. The left internal carotid artery is occluded through the terminus and extending in the left M1 M2 and M3 branches. There is poor collateral circulation on the left. There is a large territory infarct involving the left hemisphere. Infarct volume 266 mL.  4/28 Admit MR Brain:  Restricted diffusion throughout much of the left MCA vascular territory consistent with acute ischemia. Restricted diffusion consistent with acute ischemia is also present within the paramedian left frontoparietal lobes, although this is less well assessed due to the degree of motion degradation at the level of the vertex. There is little if any corresponding T2/FLAIR hyperintensity at these  sites. No significant mass effect. No midline shift.  4/28 Post Thrombectomy CT Head: : 1. 3.0 x 2.6 x 3.9 cm region of hyperdensity centered within the left basal ganglia, left subinsular region and inferomedial left temporal lobe likely reflecting a  combination of parenchymal hematoma and contrast staining.  2. Edema with loss of gray-white differentiation within the left basal ganglia, left insula and anterior left temporal lobe, likely acute infarction. Subtle changes of acute infarction are also suspected within the paramedian left frontal lobe ACA vascular territory. 3. Scattered small-volume subarachnoid hemorrhage along the left cerebral hemisphere. 4. Regional mass effect with effacement of the left lateral ventricle temporal horn. No midline shift.  4/28 Post Thrombectomy MR Brain:  1. Restricted diffusion throughout the majority of the left MCA and ACA vascular territories consistent with acute ischemia. 2. No significant mass effect at this time. No midline shift. 3. The acute parenchymal hemorrhage and/or contrast staining centered within left basal ganglia and inferomedial left temporal lobe on prior head CT does not appear significantly changed. Based on the MR appearance, it is suspected that a significant component of this previously demonstrated hyperdensity reflects contrast staining. 4. Redemonstrated small volume subarachnoid hemorrhage overlying the left cerebral hemisphere. Small volume subarachnoid hemorrhage is also questioned along the right cerebral hemisphere posteriorly.  4/29 Post Hemicraniectomy CT head:  Complete left ACA/MCA territory infarction with swollen brain bulging through craniectomy defect. No midline shift or entrapment. Petechial hemorrhage at left basal ganglia. Extraaxial hemorrhage along surface of the infarct.   5/1 Head CT > Slightly increased rightward midline shift with a small amount of herniation beneath the anterior falx. Otherwise unchanged Examination.  5/6 Head CT: Left ACA and MCA territory infarct with unchanged degree of subarachnoid hemorrhage. Swelling has progressed with greater herniation through the craniectomy defect and 5 mm of midline shift.  5/8: Bilateral acute DVT  involving the right common femoral vein right proximal profundal vein and right popliteal vein also left DVT involving same vessels  5/12 Renal US: Technically challenging exam due to patient's intubated status and difficulty with repositioning.  Diffusely increased cortical echogenicity in both kidneys compatible with medical renal disease.  Increased echogenicity of the liver with diminished through transmission, most often reflective of hepatic steatosis.  Micro Data:  4/28: SARS-CoV-2/Influenza A/Influenza B PCR: Negative  4/30: Respiratory culture: Abundant Moraxella Catarrhalis, Haemophilus Influenzae, beta lactamase positive  5/2: Urine culture: NGTD  5/2: BC x2: NGTD 5/7 resp >> H flu 5/7 bld >> ng 5/11 BCx2 >> 5/11 trach asp >>  Antimicrobials:  4/30 - 5/2: Cefepime 5/2 - 5/4: Augmentin  5/5 - 5/6 unasyn   Vanc 5/11 >> Cefepime 5/11 >>  Interim history/subjective:  tmax 98.2 Net +1.5 L Na 154 No acute events overnight  PEG planned for 5/14 by CCS  Objective   Blood pressure 120/81, pulse (!) 104, temperature 98.7 F (37.1 C), temperature source Axillary, resp. rate (!) 22, height 6' (1.829 m), weight 108.5 kg, SpO2 95 %.    Vent Mode: PRVC FiO2 (%):  [40 %] 40 % Set Rate:  [16 bmp] 16 bmp Vt Set:  [620 mL] 620 mL PEEP:  [5 cmH20] 5 cmH20 Pressure Support:  [8 cmH20] 8 cmH20 Plateau Pressure:  [17 cmH20] 17 cmH20   Intake/Output Summary (Last 24 hours) at 07/16/2019 0931 Last data filed at 07/16/2019 0800 Gross per 24 hour  Intake 4792.15 ml  Output 3000 ml  Net 1792.15 ml   Filed Weights   07/08/19 0427  07/09/19 0436 07/10/19 0500  Weight: 112 kg 109.2 kg 108.5 kg    Examination: General:  Chronically ill appearing male sitting upright in bed in NAD on ATC HEENT: MM pink/moist, left nare cortrak, midline sutured trach, left frontal crani site soft, healing, pupils 3/reactive Neuro: Will open eyes to command and move left arm/ leg to command, remains  flaccid on right  CV: rr, no murmur PULM:  Non labored on ATC, reportedly less secretions today, strong cough, tan secretions GI: obese, soft, bs active, flexi seal, condom cath Extremities: warm/dry, overall generalized edema, +2-3 RLE Skin: patchy scaly rash to knees/ arms   Resolved Hospital Problem list   treated HCAP with Moraxella and Haemophilus influenza, these were beta-lactamase positive, antibiotics were completed 5/6 COPD exacerbation Ileus  Assessment & Plan:  49 yo with h/o COPD and tobacco use presented with large L MCA stroke s/p IR thrombectomy and subsequent hemorrhage s/p hemicraniectomy.   Large Left ICA/MCA CVA s/p IR thrombectomy and left decompressive hemicraniectomy  significant cerebral edema: Repeat head CT was obtained following the procedure out of concern for possible hemorrhage and showed moderate basal ganglia hemorrhage vs stroke. Repeat MRI revealed that the ICA and MCA re-occluded . Neurology primary team and managing.   Plan Per Neurology and NSGY Continue serial neuro checks Aggressive PT/ OT Peg placement scheduled for 5/14 by [facility] Continue ASA   Acute hypoxic respiratory failure requiring mechanical ventilation S/p Tracheostomy 5/8 Underlying possible COPD New R basilar infiltrate and increased secretions 5/11 Plan Continue goal ATC during day and MV rest as needed at night  Titrate oxygen as able for sats > 94% Continue duonebs, pulmicort VAP bundle Ongoing trach care- sutures to be out today  abx as below, following trach culture PT/ OT   Sepsis due to new R basilar infiltrate- improving - increased secretions 5/11 - acute b/l DVT 5/8 Plan Follow pan cultures  Continue vanc and cefepime for now pending cx data Trend WBC / fever curve - improving WBC/ fever curve  Acute bilateral lower extremity DVT Status post retrievable IVC filter 5/9  Plan  PE is in the differential, but he is not a candidate for full anticoagulation right now  and further investigation will not change treatment plan  SVT, rate now sinus tach ? Possible PE contributing to rate>> IVC Filter placed Plan Tele monitoring Not as tachycardic today  As needed metoprolol  AKI- resolved  Hypernatremia- likely in the setting of recent diuresis and insensible losses with s Plan Starting D5 at 50 ml/hr continue free water bolus, 200 ml free water q 2hr Adequate urine output  Trend BMP/ strict I/Os Renal ultrasound - limited study, c/w with medical disease FENa c/w pre-renal, sCr improving, adequate UOP  Ileus: Resolving Plan TFs at goal Goal K > 4, Mag > 2- currently at goal   Best practice:  Diet: Tube Feeds  Pain/Anxiety/Delirium protocol (if indicated): minimize, fentanyl prn VAP protocol (if indicated): yes DVT prophylaxis:  IVC filter GI prophylaxis: Protonix Glucose control: trend on BMP Mobility: PT/ OT Code Status: FULL Family Communication: pending Disposition: ICU  > plan for LTAC, however issue with placement given no insurance     Posey Boyer, MSN, AGACNP-BC [facility] 07/16/2019, 9:31 AM  See Loretha Stapler for personal pager PCCM on call pager [phone]

## 2019-07-16 NOTE — TOC Initial Note (Signed)
Transition of Care [facility]) - Initial/Assessment Note    Patient Details  Name: Lee Hooper MRN: [id number] Date of Birth: 1970/11/03  [facility]) CM/SW Contact:    Glennon Mac, RN Phone Number: 07/16/2019, 10:29 AM  Clinical Narrative:                   Expected Discharge Plan: Skilled Nursing Facility Barriers to Discharge: Continued Medical Work up   Patient Goals and CMS Choice        Expected Discharge Plan and Services Expected Discharge Plan: Skilled Nursing Facility   Discharge Planning Services: CM Consult   Living arrangements for the past 2 months: Single Family Home                                      Prior Living Arrangements/Services Living arrangements for the past 2 months: Single Family Home Lives with:: Spouse Patient language and need for interpreter reviewed:: Yes        Need for Family Participation in Patient Care: Yes (Comment) Care giver support system in place?: Yes (comment)   Criminal Activity/Legal Involvement Pertinent to Current Situation/Hospitalization: No - Comment as needed  Activities of Daily Living   ADL Screening (condition at time of admission) Patient's cognitive ability adequate to safely complete daily activities?: No Is the patient deaf or have difficulty hearing?: No Does the patient have difficulty seeing, even when wearing glasses/contacts?: Yes Does the patient have difficulty concentrating, remembering, or making decisions?: Yes Patient able to express need for assistance with ADLs?: No Does the patient have difficulty dressing or bathing?: Yes Independently performs ADLs?: No Does the patient have difficulty walking or climbing stairs?: Yes Weakness of Legs: Both Weakness of Arms/Hands: Both                   Emotional Assessment Appearance:: Appears stated age Attitude/Demeanor/Rapport: Unable to Assess Affect (typically observed): Unable to Assess        Admission  diagnosis:  Acute respiratory failure (HCC) [J96.00] Pneumonia [J18.9] Stroke [facility]) [I63.9] Encounter for imaging to screen for metal prior to MRI [Z13.89] Middle cerebral artery embolism, left [I66.02] Cerebrovascular accident (CVA) due to embolism of left carotid artery (HCC) [I63.132] Cerebrovascular accident (CVA), unspecified mechanism (HCC) [I63.9] Cerebral infarction due to occlusion of left internal carotid artery [facility]) [I63.232] Patient Active Problem List   Diagnosis Date Noted  . Acute respiratory failure (HCC)   . Stroke (HCC) 07/01/2019  . Middle cerebral artery embolism, left 07/01/2019  . 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  Quintella Baton, RN, BSN  Trauma/Neuro ICU Case Manager [phone]

## 2019-07-16 NOTE — Progress Notes (Signed)
STROKE TEAM PROGRESS NOTE   INTERVAL HISTORY Patient remains intubated on ventilatory support for respiratory failure.  He was however able to tolerate 8 hours of trach collar yesterday.  Is afebrile and less tachycardic today the white count is still marginally elevated 11.0.  Serum sodium is yet elevated 154 today is day 3 of antibiotics.  PEG tube is planned for tomorrow.  Patient is arousable but follows very few commands.  Is purposeful movements on the left and remains plegic on the right  Vitals:   07/16/19 0600 07/16/19 0700 07/16/19 0751 07/16/19 0800  BP: 105/65 112/80 112/80 125/77  Pulse: (!) 103 (!) 103 100 100  Resp: 20 19 (!) 31 (!) 27  Temp:    98.7 F (37.1 C)  TempSrc:    Axillary  SpO2: 95% 95% 97% 96%  Weight:      Height:       CBC:  Recent Labs  Lab 07/15/19 0438 07/15/19 0438 07/15/19 0544 07/16/19 0658  WBC 11.9*  --   --  11.0*  HGB 12.5*   < > 12.2* 11.8*  HCT 41.3   < > 36.0* 38.8*  MCV 95.4  --   --  94.9  PLT 151  --   --  135*   < > = values in this interval not displayed.   Basic Metabolic Panel:  Recent Labs  Lab 07/15/19 0438 07/15/19 0438 07/15/19 0544 07/16/19 0658  NA 153*   < > 154* 154*  K 3.6   < > 3.5 4.4  CL 119*  --   --  121*  CO2 21*  --   --  24  GLUCOSE 153*  --   --  121*  BUN 71*  --   --  48*  CREATININE 1.87*  --   --  1.20  CALCIUM 9.3  --   --  9.4  MG 2.8*  --   --  2.7*  PHOS 4.2  --   --  2.9   < > = values in this interval not displayed.    IMAGING past 24 hours US RENAL  Result Date: 07/15/2019 CLINICAL DATA:  Acute renal failure EXAM: RENAL / URINARY TRACT ULTRASOUND COMPLETE COMPARISON:  None. FINDINGS: Right Kidney: Renal measurements: 11.7 x 4.5 x 4.5 = volume: 126 mL. Increased cortical echogenicity with some loss of the corticomedullary differentiation. No concerning renal mass, visible shadowing calculus, or hydronephrosis. Left Kidney: Renal measurements: 12.6 x 5.3 x 3.8 cm = volume: 134 mL.  Increased cortical echogenicity with some loss of the corticomedullary differentiation. No concerning renal mass, visible shadowing calculus, or hydronephrosis. Bladder: Appears normal for degree of bladder distention. Other: Incidental note made of increased hepatic echogenicity with some diminished posterior through transmission which could reflect hepatic steatosis. IMPRESSION: Technically challenging exam due to patient's intubated status and difficulty with repositioning. Diffusely increased cortical echogenicity in both kidneys compatible with medical renal disease. Increased echogenicity of the liver with diminished through transmission, most often reflective of hepatic steatosis. Electronically Signed   By: Kreg Shropshire M.D.   On: 07/15/2019 20:34    PHYSICAL EXAM       General -mildly obese middle-aged Caucasian male, i s/p tracheostomy on ventilatory support  Ophthalmologic - fundi not visualized due to noncooperation.  Cardiovascular - Regular rate and rhythm.  Neuro -s/p tracheostomy.  Off sedation, eyes open and only occasionally following midline commands.   left gaze preference position, not blinking to visual threat bilaterally, doll's  eyes present, PERRL. Corneal reflex weak on the right and brisk on the left, gag and cough present. Breathing over the vent.  Facial symmetry not able to test due to ET tube.  Tongue protrusion not cooperative.  Possibly intermittent following commands left hand.  Spontaneous and purposeful movement left UE 2/5 and LE 2/5. However, right UE and LE plegic. DTR 1+ and no babinski. Sensation, coordination and gait not tested.    ASSESSMENT/PLAN Lee Hooper is a 49 y.o. male with history of COPD, ongoing tobacco use, and psoriasis, presenting with dense LMCA syndrome. Attempt for Left ICA and Left M1 thrombectomy was done emergently with TICI 2b flow. Unfortunately, the pt had post procedure hemorrhage and reocclusion of the vessels. F/u imaging  suggested early edema and pt underwent prophylactic decompressive hemicraniectomy.  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  Stroke etiology unknown - given DVT requiring eventual AC and high mRS, will not do further embolic workup  Code Stroke CT head showed early hypodensity in nearly entire LMCA. ASPECTS 6-7 per notes.  CTA head & neck occlusion cervical ICA on the left. This is most likely due to dissection. The patient has minimal atherosclerotic disease.  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  There is poor collateral circulation on the left.   CTP CBF large core volume of 266 mL, perfusion volume 313 mL, mismatch volume 47 mL.  ASPECTS using CTP by Dr. Chestine Spore 6 or 7.   IR - TICI2c revascularization  MRI  Restricted diffusion throughout the majority of the left MCA and ACA vascular territories consistent with acute ischemia.  MRA - reocclusion of the intracranial left internal carotid artery. No flow related signal is seen within the left middle cerebral artery.  CT Head 07/04/19 - Slightly increased rightward midline shift with a small amount of herniation beneath the anterior falx. Otherwise unchanged examination.  CT repeat 5/6 stable infarcts and SAH but increasing edema w/ midline shift now 44mm and herniation through crani site  2D Echo - EF 55 - 60%. No cardiac source of emboli identified.   LDL 45  HgbA1c 5.5  subq heparin for VTE prophylaxis  No antithrombotic prior to admission, no antithrombotics d/t hemorrhagic conversion, now on aspirin 81. Consider AC in 2 mos  Therapy recommendations:  SNF  Disposition:  Pending - LTACH consulted  however no insurance  Respiratory failure s/p Trach Trach wound  Intubated on vent  CCM on board  CXR 07/03/19- New consolidation in the right lower lobe and left infrahilar region concerning for pneumonia. Possible small layering right effusion.  CXR 5/3 mild pulm edema w/ atx vs infiltrate/aspiration  On cefepime 4/30>>5/2 for pneumonia  Augmentin 5/2>>5/4  Unasyn 5/5>>5.6 lasix  Trach placed 5/8 per CCM  Fever and elevated white count and tachycardia hence restarted on antibiotics on 07/14/2019  CPAP trials underway  WOC consulted  On TC x 8 h yesterday, on TC this am  COPD exacerbation, resolved  On prednisone, ended 5/5  Cerebral edema Hypernatremia   MRI - Restricted diffusion throughout the majority of the left MCA and ACA vascular territories consistent with acute ischemia.  CT Head 07/04/19 -  Slightly increased rightward midline shift with a small amount of herniation beneath the anterior falx. Otherwise unchanged examination.  S/p decompressive left hemicraniectomy  4/28 Dr Christella Noa  CT 5/6 - stable infarcts and SAH but increasing edema w/ midline shift now 50mm and herniation through crani site  On keppra. Plan 2 wk course per NSG  Off 3%. On D5NS @ 38  Okay to allow Na gradually normalize  Na still high at 154  Free water 200 q2h, add D5W at 50h  Crani sutures out   Sepsis  Fever w/ leukocytosis HCAP  Tmax 99.6  Leukocytosis - 11.0  UA neg  Sputum moraxella, H. flu   On cefepime 4/30>>5/2; Augmentin 5/2>>5/4, Unasyn 5/5>>5/6   Blood culture 5/2 no growth  Blood cultures 5/7 - no growth   Resp cultures 5/7 - no growth  CXR 5/9 - streaky BLL opacilites atx/consolication/PNA. Possible R pleural effusion  CXR 5/10 w/ atx vs infiltrate  Sputum cx 5/11 reincubated  Blood Cx 5/11 no growth < 24h  Vanc 5/11>>, Cefepime 5/11>>  Bilateral Lower Ext DVT, extensive Possible PE given tachycardia  Venous US 07/11/2019 DVT + R common  femoral, SF jxn, R femoral, R proximal profunda, R popliteal, R posterior tib & R peroneal veins. DVT + L common femoral, SF jxn, L femoral, L proximal profund veins.  IVC filter placed 5/9  On aspirin 81 now; consider AC in 2 mos   Hypertension Tachycardia  Home meds:  None  Current meds: none scheduled  BP On the low side   SBP goal < 160 due to hemorrhagic conversion . Long-term BP goal normotensive . Metoprolol prn but watch low BP . Tachycardia improved on TC  Dysphagia   NPO  Coretrak 5/7  On TF, free water  SLP on board  PEG - trauma onboard - planned 5/14  Ileus, improved  abd distension w/ decreased BS  KUB 5/2 distended colonic loop  Miralax   Flexiseal following laxatives  On reglan, laxatives on hold, decrease fentanyl    Coretrak 5/7  TF advance to goal  Tobacco abuse  Current smoker  Smoking cessation counseling will be provided if able  Other Stroke Risk Factors  Obesity, Body mass index is 32.44 kg/m., recommend weight loss, diet and exercise as appropriate   Likely undiagnosed obstructive sleep apnea based on body habitus  Other Active Problems  Code Status - Full code  Hypokalemia - resolved  Thrombocytopenia - resolved  Agitation. Wrist restraint  Hypomagnesemia - 2.8  ARF. Cre rising 1.2. Holding diuresis. Free water. Renal US increased cortical echogenicity in B kidneys c/w renal dz. Increased echogenicity of l ives ?hepatic steatosis. Urine studies elevated protein creatinine ratio, Na normal, UN pending   Hospital day # 15 Continue antibiotics for aspiration pneumonia and trials of trach collar to wean off ventilatory support.  Discussed with Dr. Ruthann Cancer critical care medicine.  PEG tube placement likely tomorrow by trauma team.  No family available at the bedside for discussion.  This patient is critically ill and at significant risk of neurological worsening, death and care requires constant monitoring of vital  signs, hemodynamics,respiratory and cardiac monitoring, extensive review of multiple databases, frequent neurological assessment, discussion with family, other specialists and medical decision making of high complexity.I have made any additions or clarifications directly to the above note.This critical care time does not reflect procedure time, or teaching time or supervisory time of PA/NP/Med Resident etc but could involve care discussion time. I spent 30 minutes  of neurocritical care time  in the care of  this patient.   Delia Heady, MD   To contact Stroke Continuity provider, please refer to [email or web address]. After hours, contact General Neurology

## 2019-07-17 ENCOUNTER — Encounter (HOSPITAL_COMMUNITY)
Admission: EM | Disposition: A | Discharge status: Hospice | Payer: Self-pay | Source: Home / Self Care | Attending: Neurology

## 2019-07-17 HISTORY — PX: PEG PLACEMENT: SHX5437

## 2019-07-17 HISTORY — PX: ESOPHAGOGASTRODUODENOSCOPY: SHX5428

## 2019-07-17 LAB — MAGNESIUM: Magnesium: 2.5 mg/dL — ABNORMAL HIGH (ref 1.7–2.4)

## 2019-07-17 LAB — GLUCOSE, CAPILLARY
Glucose-Capillary: 106 mg/dL — ABNORMAL HIGH (ref 70–99)
Glucose-Capillary: 109 mg/dL — ABNORMAL HIGH (ref 70–99)
Glucose-Capillary: 120 mg/dL — ABNORMAL HIGH (ref 70–99)
Glucose-Capillary: 127 mg/dL — ABNORMAL HIGH (ref 70–99)
Glucose-Capillary: 96 mg/dL (ref 70–99)
Glucose-Capillary: 96 mg/dL (ref 70–99)

## 2019-07-17 LAB — CULTURE, RESPIRATORY W GRAM STAIN: Special Requests: NORMAL

## 2019-07-17 LAB — CBC
HCT: 39 % (ref 39.0–52.0)
Hemoglobin: 11.8 g/dL — ABNORMAL LOW (ref 13.0–17.0)
MCH: 28.9 pg (ref 26.0–34.0)
MCHC: 30.3 g/dL (ref 30.0–36.0)
MCV: 95.4 fL (ref 80.0–100.0)
Platelets: 132 10*3/uL — ABNORMAL LOW (ref 150–400)
RBC: 4.09 MIL/uL — ABNORMAL LOW (ref 4.22–5.81)
RDW: 14.9 % (ref 11.5–15.5)
WBC: 9.7 10*3/uL (ref 4.0–10.5)
nRBC: 0.5 % — ABNORMAL HIGH (ref 0.0–0.2)

## 2019-07-17 LAB — COMPREHENSIVE METABOLIC PANEL WITH GFR
ALT: 156 U/L — ABNORMAL HIGH (ref 0–44)
AST: 85 U/L — ABNORMAL HIGH (ref 15–41)
Albumin: 1.9 g/dL — ABNORMAL LOW (ref 3.5–5.0)
Alkaline Phosphatase: 62 U/L (ref 38–126)
Anion gap: 7 (ref 5–15)
BUN: 35 mg/dL — ABNORMAL HIGH (ref 6–20)
CO2: 25 mmol/L (ref 22–32)
Calcium: 9.5 mg/dL (ref 8.9–10.3)
Chloride: 118 mmol/L — ABNORMAL HIGH (ref 98–111)
Creatinine, Ser: 1.06 mg/dL (ref 0.61–1.24)
GFR calc Af Amer: >60 mL/min
GFR calc non Af Amer: >60 mL/min
Glucose, Bld: 116 mg/dL — ABNORMAL HIGH (ref 70–99)
Potassium: 3.8 mmol/L (ref 3.5–5.1)
Sodium: 150 mmol/L — ABNORMAL HIGH (ref 135–145)
Total Bilirubin: 0.7 mg/dL (ref 0.3–1.2)
Total Protein: 6.1 g/dL — ABNORMAL LOW (ref 6.5–8.1)

## 2019-07-17 LAB — PHOSPHORUS: Phosphorus: 3.2 mg/dL (ref 2.5–4.6)

## 2019-07-17 SURGERY — EGD (ESOPHAGOGASTRODUODENOSCOPY)
Anesthesia: Moderate Sedation

## 2019-07-17 MED ORDER — FENTANYL CITRATE (PF) 100 MCG/2ML IJ SOLN
100.0000 ug | Freq: Once | INTRAMUSCULAR | Status: AC
  Administered 2019-07-17: 100 ug via INTRAVENOUS

## 2019-07-17 MED ORDER — MIDAZOLAM HCL 2 MG/2ML IJ SOLN
10.0000 mg | Freq: Once | INTRAMUSCULAR | Status: AC
  Administered 2019-07-17: 2 mg via INTRAVENOUS

## 2019-07-17 MED ORDER — FENTANYL CITRATE (PF) 100 MCG/2ML IJ SOLN
INTRAMUSCULAR | Status: AC
  Filled 2019-07-17: qty 2

## 2019-07-17 MED ORDER — MIDAZOLAM HCL 2 MG/2ML IJ SOLN
INTRAMUSCULAR | Status: AC
  Administered 2019-07-17: 2 mg
  Filled 2019-07-17: qty 2

## 2019-07-17 MED ORDER — VITAL HIGH PROTEIN PO LIQD
1000.0000 mL | ORAL | Status: AC
  Administered 2019-07-17 – 2019-07-20 (×4): 1000 mL

## 2019-07-17 MED ORDER — CEFAZOLIN SODIUM-DEXTROSE 2-4 GM/100ML-% IV SOLN
2.0000 g | Freq: Three times a day (TID) | INTRAVENOUS | Status: AC
  Administered 2019-07-17 – 2019-07-20 (×11): 2 g via INTRAVENOUS
  Filled 2019-07-17 (×11): qty 100

## 2019-07-17 MED ORDER — MIDAZOLAM HCL 2 MG/2ML IJ SOLN
INTRAMUSCULAR | Status: AC
  Filled 2019-07-17: qty 4

## 2019-07-17 NOTE — Progress Notes (Signed)
NAME:  Lee Hooper, MRN:  [id number], DOB:  14-Aug-1970, LOS: 80 ADMISSION DATE:  07/01/2019, CONSULTATION DATE:  07/01/19 REFERRING MD:  Aroor MD, CHIEF COMPLAINT:  CVA    Brief History   Lee Hooper is a 49 yo M who presented as a code stroke after being found aphasic and nonverbal by his wife. CT/CTA studies showed left ICA/MCA thrombosis and pt was taken for IR thrombectomy for left ICA and left M1 occlusions. Repeat head CT was obtained following the procedure out of concern for possible hemorrhage and showed moderate basal ganglia hemorrhage vs stroke. Repeat MRI revealed that the ICA and MCA re-occluded with change in FLAIR sequence excluding him from repeat thrombectomy. He underwent a L hemicraniectomy. He was intubated for airway protection.  Past Medical History  Psoriasis  COPD  Tobacco Use  Significant Hospital Events   4/28: Admit; L ICA/MCA Thrombectomy with re-occlusion --> left hemicraniectomy, intubated for acute hypoxic respiratory failure  5/3: NAE. Was taken off precedex yesterday and pt opened his eyes. Prednisone 50 mg qd was begun. He had a persistent fever around 38.2, HR 110s, BP 140s/60s. Pt required increased O2 with secretions; CXR showed mild edema with small effusions c/f aspiration vs opacification. Blood culture x2 and urine culture collected, NGTD. ABG pending. Currently satting 92% on PRVC FiO2 80, PEEP 10, Plateau 22. WBC declined to 10.5 from 11.1, Na 151 from 156. Tube feeds were held d/t distended colonic loop in RLQ on KUB, to be continued today. Thrombocytopenia improved to 93 from 81.  5/4: Patient remains intubated with spontaneous movement of left arm and leg.  5/5: NAE. Pt remains intubated on PRN fetanyl. Will open eyes and spontaneously move left leg and arm.  5/6: NAE overnight. Intubated with no sedation. 5/7: NAE. Marland Kitchen Completed Unasyn yesterday. VSS, spO2 remains in low 90s the majority of the time on 620/40%/+5. Morning labs significant  for HCO3 of 19. Otherwise unremarkable.  5/8: Ongoing fever.  Obtained ultrasound, positive for lower extremity DVT bilaterally.  Tracheostomy placed at bedside.,  Did have some SVT this responded to treating fever and rate control. 5/9: IVC filter placed 5/11: no acute events overnight. Pt with similar exam as previous however tachycardic and febrile (tmax102.7) with a bit increased wbc and new R infiltrate noted in base. Pan cx and start empiric abx. 5/12 Tolerated 8 hours of trach collar   Consults:  IR PCCM  Neuro (Primary) Neurosurgery  Procedures:  4/28 IR - L ICA/MCA Thrombectomy 4/28 L hemicraniectomy 4/28 Intubation 5/8  Trach   Significant Diagnostic Tests:  4/28 Admit CT Head Code Stroke:  Hyperdense left ICA and MCA compatible with acute thrombus. No acute infarct or hemorrhage.  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 The patient has minimal atherosclerotic disease. The left internal carotid artery is occluded through the terminus and extending in the left M1 M2 and M3 branches. There is poor collateral circulation on the left. There is a large territory infarct involving the left hemisphere. Infarct volume 266 mL.  4/28 Admit MR Brain:  Restricted diffusion throughout much of the left MCA vascular territory consistent with acute ischemia. Restricted diffusion consistent with acute ischemia is also present within the paramedian left frontoparietal lobes, although this is less well assessed due to the degree of motion degradation at the level of the vertex. There is little if any corresponding T2/FLAIR hyperintensity at these  sites. No significant mass effect. No midline shift.  4/28 Post Thrombectomy CT Head: : 1. 3.0 x 2.6 x 3.9 cm region of hyperdensity centered within the left basal ganglia, left subinsular region and inferomedial left temporal lobe likely reflecting a  combination of parenchymal hematoma and contrast staining.  2. Edema with loss of gray-white differentiation within the left basal ganglia, left insula and anterior left temporal lobe, likely acute infarction. Subtle changes of acute infarction are also suspected within the paramedian left frontal lobe ACA vascular territory. 3. Scattered small-volume subarachnoid hemorrhage along the left cerebral hemisphere. 4. Regional mass effect with effacement of the left lateral ventricle temporal horn. No midline shift.  4/28 Post Thrombectomy MR Brain:  1. Restricted diffusion throughout the majority of the left MCA and ACA vascular territories consistent with acute ischemia. 2. No significant mass effect at this time. No midline shift. 3. The acute parenchymal hemorrhage and/or contrast staining centered within left basal ganglia and inferomedial left temporal lobe on prior head CT does not appear significantly changed. Based on the MR appearance, it is suspected that a significant component of this previously demonstrated hyperdensity reflects contrast staining. 4. Redemonstrated small volume subarachnoid hemorrhage overlying the left cerebral hemisphere. Small volume subarachnoid hemorrhage is also questioned along the right cerebral hemisphere posteriorly.  4/29 Post Hemicraniectomy CT head:  Complete left ACA/MCA territory infarction with swollen brain bulging through craniectomy defect. No midline shift or entrapment. Petechial hemorrhage at left basal ganglia. Extraaxial hemorrhage along surface of the infarct.   5/1 Head CT > Slightly increased rightward midline shift with a small amount of herniation beneath the anterior falx. Otherwise unchanged Examination.  5/6 Head CT: Left ACA and MCA territory infarct with unchanged degree of subarachnoid hemorrhage. Swelling has progressed with greater herniation through the craniectomy defect and 5 mm of midline shift.  5/8: Bilateral acute DVT  involving the right common femoral vein right proximal profundal vein and right popliteal vein also left DVT involving same vessels  5/12 Renal US: Technically challenging exam due to patient's intubated status and difficulty with repositioning.  Diffusely increased cortical echogenicity in both kidneys compatible with medical renal disease.  Increased echogenicity of the liver with diminished through transmission, most often reflective of hepatic steatosis.  Micro Data:  4/28: SARS-CoV-2/Influenza A/Influenza B PCR: Negative  4/30: Respiratory culture: Abundant Moraxella Catarrhalis, Haemophilus Influenzae, beta lactamase positive  5/2: Urine culture: NGTD  5/2: BC x2: NGTD 5/7 resp >> H flu 5/7 bld >> ng 5/11 BCx2 >> 5/11 trach asp >>  Antimicrobials:  4/30 - 5/2: Cefepime 5/2 - 5/4: Augmentin  5/5 - 5/6 unasyn   Vanc 5/11 >> Cefepime 5/11 >>  Interim history/subjective:  + 1.6 L I/O, net + 1. 5 L this admission Tolerated 11 hours of T collar yesterday  Afebrile Hypernatremia improving  Objective   Blood pressure 116/69, pulse 87, temperature 98.8 F (37.1 C), temperature source Oral, resp. rate (!) 24, height 6' (1.829 m), weight 101.1 kg, SpO2 98 %.    Vent Mode: PRVC FiO2 (%):  [40 %] 40 % Set Rate:  [16 bmp] 16 bmp Vt Set:  [620 mL] 620 mL PEEP:  [5 cmH20] 5 cmH20 Plateau Pressure:  [16 cmH20] 16 cmH20   Intake/Output Summary (Last 24 hours) at 07/17/2019 0856 Last data filed at 07/17/2019 0843 Gross per 24 hour  Intake 4304.95 ml  Output 2000 ml  Net 2304.95 ml   Filed Weights   07/09/19 0436 07/10/19 0500 07/17/19 2355  Weight: 109.2 kg 108.5 kg 101.1 kg    Examination: General:  Adult male laying in bed on T Collar. HEENT: MM pink/moist, cortrak, midline trach, left frontal crani site soft, healing, pupils 3/reactive Neuro: Will open eyes to voice, noted movement on the left.  remains flaccid on right  CV: rr, no murmur PULM:  On T collar, symmetric  expansion and appears comfortable.  GI: obese, soft, bs active, flexi seal, condom cath Extremities: warm/dry, overall generalized edema, +2-3 RLE Skin: patchy scaly rash to knees/ arms   Resolved Hospital Problem list   treated HCAP with Moraxella and Haemophilus influenza, these were beta-lactamase positive, antibiotics were completed 5/6 COPD exacerbation Ileus AKI  Assessment & Plan:  49 yo with h/o COPD and tobacco use presented with large L MCA stroke s/p IR thrombectomy and subsequent hemorrhage s/p hemicraniectomy.   Large Left ICA/MCA CVA s/p IR thrombectomy and left decompressive hemicraniectomy  significant cerebral edema: Repeat head CT was obtained following the procedure out of concern for possible hemorrhage and showed moderate basal ganglia hemorrhage vs stroke. Repeat MRI revealed that the ICA and MCA re-occluded . Neurology primary team and managing.   Plan -Neurology and NSGY following -Aggressive PT/ OT -Continue ASA  -Peg placement scheduled for 5/14 by CCS  Acute hypoxic respiratory failure requiring mechanical ventilation S/p Tracheostomy 5/8 Underlying possible COPD Sepsis secondary to HAP with R basilar infiltrate and increased secretions 5/11 Plan -Continue budesonide  -Respiratory culture + E Coli, sensitive to Cefazolin. Adjust abx. End date entered -Ok for continuous T Collar, no MV at night.   Acute bilateral lower extremity DVT Status post retrievable IVC filter 5/9  Plan  -PE is in the differential, but he is not a candidate for full anticoagulation right now and further investigation will not change treatment plan  SVT, rate now sinus tach ? Possible PE contributing to rate>> IVC Filter placed Plan -Continue scheduled metoprolol  Hypernatremia- likely in the setting of recent diuresis and insensible losses Plan Hypernatremia improving with D5/FWF Continue D5 at 50 ml/h Continue FWF 200 ml q 2 hours (currently on hold for PEG tube  placement)  Ileus: Resolving Plan TFs at goal yesterday, on hold for PEG tube Goal K > 4, Mag > 2.  Replace  Best practice:  Diet: Tube Feeds  Pain/Anxiety/Delirium protocol (if indicated): minimize, fentanyl prn VAP protocol (if indicated): yes DVT prophylaxis:  IVC filter 5/9 GI prophylaxis: Protonix Glucose control: BG <150 Mobility: PT/ OT Code Status: FULL Family Communication: Will call and update Disposition: ICU  > plan for LTAC, however issue with placement given no insurance     Earney Hamburg, ACNP [facility]   See Amion for personal pager PCCM on call pager [phone]  CCT: 35 min

## 2019-07-17 NOTE — Progress Notes (Signed)
Occupational Therapy Treatment Patient Details Name: Lee Hooper MRN: [id number] DOB: 09/09/1970 Today's Date: 07/17/2019    History of present illness This 49 y.o. male admitted 4/28 with Rt sided hemiplegia and aphasia.  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  Repeat CT following procedure showed moderate basal ganglia hemorrhage vs. stroke   Repeat MRI revealed that the ICA and MCA re-occluded with change in FLAIR sequence excluding him from repeat thrombectomy.  He underwent a decompressive hemicraniectomy.  He waws intubated 4/28 and remains intubated.  He developed an ileus 5/2. Trach 5/8. IVC filter placed 5/9.   PMH includes: COPD, tobacco abuse, psoriasis   OT comments  PT admitted with L craniotomy. Pt currently with functional limitiations due to the deficits listed below (see OT problem list). Pt currently without bone flap and no flap in abdomen. Recommend helmet prior to d/c. Please order helmet if agreeable. Pt requires total +2 (A) to sit on EOB with incontinence of bowel.  Pt will benefit from skilled OT to increase their independence and safety with adls and balance to allow discharge SNF.   Follow Up Recommendations  SNF;Supervision/Assistance - 24 hour    Equipment Recommendations  Other (comment)    Recommendations for Other Services      Precautions / Restrictions Precautions Precautions: Fall Precaution Comments: R hemi, trach, vent, coretrach, flexiseal Restrictions Weight Bearing Restrictions: No       Mobility Bed Mobility Overal bed mobility: Needs Assistance Bed Mobility: Rolling;Supine to Sit;Sit to Supine Rolling: Max assist;+2 for physical assistance;+2 for safety/equipment   Supine to sit: Total assist;+2 for physical assistance;+2 for safety/equipment Sit to supine: Total assist;+2 for physical assistance;+2 for safety/equipment   General bed mobility comments: pt requires total +2 to progress to L side of bed. pt pushing  with L UE once on elbow to (A) iwth trunk activaiton noted. pt sitting eob with R side lean. pt noted to have drainage from foley at this time. pt worked on Secondary school teacher. pt required extended time due to vital increase RN at bedside to increased oxygen saturation  Transfers                 General transfer comment: unable to attempt     Balance Overall balance assessment: Needs assistance Sitting-balance support: Bilateral upper extremity supported;Feet supported Sitting balance-Leahy Scale: Zero Sitting balance - Comments: R lateral lean Postural control: Posterior lean;Right lateral lean                                 ADL either performed or assessed with clinical judgement   ADL Overall ADL's : Needs assistance/impaired Eating/Feeding: NPO                                     General ADL Comments: pt incontinence of flexiseal repositioned EOB     Vision       Perception     Praxis      Cognition Arousal/Alertness: Awake/alert Behavior During Therapy: Flat affect;Impulsive;Restless Overall Cognitive Status: Impaired/Different from baseline Area of Impairment: Attention;Following commands;Awareness;Problem solving;Safety/judgement                   Current Attention Level: Focused   Following Commands: Follows one step commands inconsistently;Follows one step commands with increased time Safety/Judgement: Decreased awareness of  safety;Decreased awareness of deficits Awareness: Intellectual Problem Solving: Slow processing;Decreased initiation;Difficulty sequencing General Comments: pt with bil eyes opened once sitting the entire upright time. pt with eyes closed in supine. pt pushing with L UE. pt reaching for lines at times during session. pt noted to have R Ue movement in response to inline suction from RN        Exercises Other Exercises Other Exercises: weight bearing R UE   Shoulder Instructions        General Comments in-line trach collar 40% with increase to 60% during session oxygen saturation down to 86% , RR 31-35 RN 87-120. pt with noticeable wet rattle sounds in sitting with suction from RN x2. pt using accessory muscles in sitting with incr RR    Pertinent Vitals/ Pain       Pain Assessment: Faces Faces Pain Scale: Hurts little more Pain Location: grimacing Pain Descriptors / Indicators: Grimacing Pain Intervention(s): Limited activity within patient's tolerance;Premedicated before session;Repositioned  Home Living                                          Prior Functioning/Environment              Frequency  Min 2X/week        Progress Toward Goals  OT Goals(current goals can now be found in the care plan section)  Progress towards OT goals: Progressing toward goals  Acute Rehab OT Goals Patient Stated Goal: none stated by patient OT Goal Formulation: Patient unable to participate in goal setting Time For Goal Achievement: 07/21/19 Potential to Achieve Goals: Fair ADL Goals Pt Will Perform Grooming: with mod assist;bed level Additional ADL Goal #1: Pt will locate items on his Rt with mod cues/prompting Additional ADL Goal #2: Pt will maintain EOB sitting x 5 mins with max A in prep for ADLs Additional ADL Goal #3: Pt will demonstrate sustained attention during simple ADLs with Min cues Additional ADL Goal #4: Pt will perform bed mobility with Mod A +2 in preparation for ADLs  Plan Discharge plan remains appropriate    Co-evaluation    PT/OT/SLP Co-Evaluation/Treatment: Yes Reason for Co-Treatment: Complexity of the patient's impairments (multi-system involvement);Necessary to address cognition/behavior during functional activity;For patient/therapist safety;To address functional/ADL transfers   OT goals addressed during session: ADL's and self-care;Strengthening/ROM;Proper use of Adaptive equipment and DME      AM-PAC OT "6  Clicks" Daily Activity     Outcome Measure   Help from another person eating meals?: Total Help from another person taking care of personal grooming?: A Lot Help from another person toileting, which includes using toliet, bedpan, or urinal?: Total Help from another person bathing (including washing, rinsing, drying)?: Total Help from another person to put on and taking off regular upper body clothing?: Total Help from another person to put on and taking off regular lower body clothing?: Total 6 Click Score: 7    End of Session Equipment Utilized During Treatment: Other (comment)  OT Visit Diagnosis: Unsteadiness on feet (R26.81);Cognitive communication deficit (R41.841);Hemiplegia and hemiparesis;Muscle weakness (generalized) (M62.81);Apraxia (R48.2);Other symptoms and signs involving cognitive function;Pain Symptoms and signs involving cognitive functions: Cerebral infarction Hemiplegia - Right/Left: Right Hemiplegia - dominant/non-dominant: Dominant Hemiplegia - caused by: Cerebral infarction   Activity Tolerance Patient limited by fatigue   Patient Left in bed;with call bell/phone within reach;with bed alarm set;with SCD's reapplied   Nurse Communication  Mobility status        Time: 3545-6256 OT Time Calculation (min): 40 min  Charges: OT General Charges $OT Visit: 1 Visit OT Treatments $Self Care/Home Management : 8-22 mins   Brynn, OTR/L  Acute Rehabilitation Services Pager: [phone] Office: [phone] .    Jeri Modena 07/17/2019, 10:56 AM

## 2019-07-17 NOTE — Progress Notes (Signed)
STROKE TEAM PROGRESS NOTE   INTERVAL HISTORY Patient looks neurologically unchanged.  He is arousable.  He does not follow commands consistently.  His spontaneous left-sided movements and dense right hemiplegia persists.  He looks clinically improved and is afebrile with normal white count and serum sodium is also down to 150.  PEG tube is planned for later today.  He has tolerated 8 to 10 hours of trach collar.  Vitals:   07/17/19 0700 07/17/19 0800 07/17/19 0839 07/17/19 0845  BP: 103/70 116/69 116/69   Pulse: 84 93 87   Resp: 16 17 (!) 24   Temp:  98.8 F (37.1 C)    TempSrc:  Oral    SpO2: 96% 97% 98% 97%  Weight:      Height:       CBC:  Recent Labs  Lab 07/16/19 0658 07/17/19 0528  WBC 11.0* 9.7  HGB 11.8* 11.8*  HCT 38.8* 39.0  MCV 94.9 95.4  PLT 135* 875*   Basic Metabolic Panel:  Recent Labs  Lab 07/16/19 0658 07/17/19 0528  NA 154* 150*  K 4.4 3.8  CL 121* 118*  CO2 24 25  GLUCOSE 121* 116*  BUN 48* 35*  CREATININE 1.20 1.06  CALCIUM 9.4 9.5  MG 2.7* 2.5*  PHOS 2.9 3.2    IMAGING past 24 hours No results found.  PHYSICAL EXAM       General -mildly obese middle-aged Caucasian male, i s/p tracheostomy on ventilatory support  Ophthalmologic - fundi not visualized due to noncooperation.  Cardiovascular - Regular rate and rhythm.  Neuro -s/p tracheostomy.  Off sedation, eyes open and only occasionally following midline commands.   left gaze preference position, not blinking to visual threat bilaterally, doll's eyes present, PERRL. Corneal reflex weak on the right and brisk on the left, gag and cough present. Breathing over the vent.  Facial symmetry not able to test due to ET tube.  Tongue protrusion not cooperative.  Possibly intermittent following commands left hand.  Spontaneous and purposeful movement left UE 2/5 and LE 2/5. However, right UE and LE plegic. DTR 1+ and no babinski. Sensation, coordination and gait not tested.    ASSESSMENT/PLAN Mr.  SCHUYLER OLDEN is a 49 y.o. male with history of COPD, ongoing tobacco use, and psoriasis, presenting with dense LMCA syndrome. Attempt for Left ICA and Left M1 thrombectomy was done emergently with TICI 2b flow. Unfortunately, the pt had post procedure hemorrhage and reocclusion of the vessels. F/u imaging suggested early edema and pt underwent prophylactic decompressive hemicraniectomy.  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  Stroke etiology unknown - given DVT requiring eventual AC and high mRS, will not do further embolic workup  Code Stroke CT head showed early hypodensity in nearly entire LMCA. ASPECTS 6-7 per notes.  CTA head & neck occlusion cervical ICA on the left. This is most likely due to dissection. The patient has minimal atherosclerotic disease.  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  There is poor collateral circulation on the left.   CTP CBF large core volume of 266 mL, perfusion volume 313 mL, mismatch volume 47 mL.  ASPECTS using CTP by  Dr. Chestine Spore 6 or 7.   IR - TICI2c revascularization  MRI  Restricted diffusion throughout the majority of the left MCA and ACA vascular territories consistent with acute ischemia.  MRA - reocclusion of the intracranial left internal carotid artery. No flow related signal is seen within the left middle cerebral artery.  CT Head 07/04/19 - Slightly increased rightward midline shift with a small amount of herniation beneath the anterior falx. Otherwise unchanged examination.  CT repeat 5/6 stable infarcts and SAH but increasing edema w/ midline shift now 74mm and herniation through crani site  2D Echo - EF 55 -  60%. No cardiac source of emboli identified.   LDL 45  HgbA1c 5.5  subq heparin for VTE prophylaxis  No antithrombotic prior to admission, no antithrombotics d/t hemorrhagic conversion, now on aspirin 81. Consider AC in 2 mos  Therapy recommendations:  SNF  Disposition:  Pending - LTACH consulted however no insurance  Respiratory failure s/p Trach Trach wound  Intubated on vent  CCM on board  CXR 07/03/19- New consolidation in the right lower lobe and left infrahilar region concerning for pneumonia. Possible small layering right effusion.  CXR 5/3 mild pulm edema w/ atx vs infiltrate/aspiration  On cefepime 4/30>>5/2 for pneumonia  Augmentin 5/2>>5/4  Unasyn 5/5>>5.6 lasix  Trach placed 5/8 per CCM  Fever and elevated white count and tachycardia hence restarted on antibiotics on 07/14/2019  CPAP trials underway  WOC consulted for trach wound  On trach collar   COPD exacerbation, resolved  On prednisone, ended 5/5  Cerebral edema Hypernatremia   MRI - Restricted diffusion throughout the majority of the left MCA and ACA vascular territories consistent with acute ischemia.  CT Head 07/04/19 - Slightly increased rightward midline shift with a small amount of herniation beneath the anterior falx. Otherwise unchanged examination.  S/p decompressive left hemicraniectomy  4/28 Dr Franky Macho  CT 5/6 - stable infarcts and SAH but increasing edema w/ midline shift now 62mm and herniation through crani site  On keppra. Plan 2 wk course per NSG  Off 3%. On D5NS @ 72  Okay to allow Na gradually normalize  Na lowering 150  Free water 200 q2h, add D5W at 50h  Crani sutures out   Sepsis  Fever w/ leukocytosis HCAP  Afebrile now  Leukocytosis - resolved  9.7  UA neg  Sputum moraxella, H. flu   On cefepime 4/30>>5/2; Augmentin 5/2>>5/4, Unasyn 5/5>>5/6   Blood culture 5/2 no growth  Blood cultures 5/7 - no growth   Resp cultures 5/7 - no growth  CXR 5/9 -  streaky BLL opacilites atx/consolication/PNA. Possible R pleural effusion  CXR 5/10 w/ atx vs infiltrate  Sputum cx 5/11 reincubated  Blood Cx 5/11 no growth   Vanc 5/11>>, Cefepime 5/11>>  Bilateral Lower Ext DVT, extensive Possible PE given tachycardia  Venous US 07/11/2019 DVT + R common femoral, SF jxn, R femoral, R proximal profunda, R popliteal, R posterior tib & R peroneal veins. DVT + L common femoral, SF jxn, L femoral, L proximal profund veins.  IVC filter placed 5/9  On aspirin 81 now; consider AC in 2 mos   Hypertension Tachycardia  Home meds:  None  Current meds: none scheduled  BP On the low side   SBP goal < 160 due to hemorrhagic conversion . Long-term BP goal normotensive . Metoprolol prn but watch low BP . Tachycardia improved on TC  Dysphagia   NPO  Coretrak 5/7  On TF, free water  SLP on board  PEG - trauma placement 5/14  Ileus, resolved  abd distension w/ decreased BS  KUB 5/2 distended colonic loop  Miralax   Flexiseal following laxatives  On reglan, laxatives on hold, decrease fentanyl  Coretrak 5/7  TF on hold for PEG  Tobacco abuse  Current smoker  Smoking cessation counseling will be provided if able  Other Stroke Risk Factors  Obesity, Body mass index is 30.23 kg/m., recommend weight loss, diet and exercise as appropriate   Likely undiagnosed obstructive sleep apnea based on body habitus  Other Active Problems  Code Status - Full code  Hypokalemia - resolved  Thrombocytopenia - resolved  Agitation. Wrist restraint  Hypomagnesemia - 2.8  ARF. Cre 1.06. Holding diuresis. Free water. Renal US increased cortical echogenicity in B kidneys c/w renal dz. Increased echogenicity of liver ?hepatic steatosis. Urine studies elevated protein creatinine ratio, Na normal, UN normal     Hospital day # 16  Continue trach collar trials and wean off ventilatory support for respiratory failure as per CCM team.  PEG tube  today by trauma team.  No family available at the bedside for discussion.  Discussed with Dr. Briant Sites, CCM and Dr. Narda Bonds trauma team  This patient is critically ill and at significant risk of neurological worsening, death and care requires constant monitoring of vital signs, hemodynamics,respiratory and cardiac monitoring, extensive review of multiple databases, frequent neurological assessment, discussion with family, other specialists and medical decision making of high complexity.I have made any additions or clarifications directly to the above note.This critical care time does not reflect procedure time, or teaching time or supervisory time of PA/NP/Med Resident etc but could involve care discussion time. I spent 30 minutes of neurocritical care time  in the care of  this patient.   Delia Heady, MD   To contact Stroke Continuity provider, please refer to [email or web address]. After hours, contact General Neurology

## 2019-07-17 NOTE — Progress Notes (Signed)
Physical Therapy Treatment Patient Details Name: Lee Hooper MRN: [id number] DOB: Jul 08, 1970 Today's Date: 07/17/2019    History of Present Illness This 49 y.o. male admitted 4/28 with Rt sided hemiplegia and aphasia.  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  Repeat CT following procedure showed moderate basal ganglia hemorrhage vs. stroke   Repeat MRI revealed that the ICA and MCA re-occluded with change in FLAIR sequence excluding him from repeat thrombectomy.  He underwent a decompressive hemicraniectomy.  He waws intubated 4/28 and remains intubated.  He developed an ileus 5/2. Trach 5/8. IVC filter placed 5/9.   PMH includes: COPD, tobacco abuse, psoriasis    PT Comments    Patient progressing to EOB this session and able to keep eyes open more as well as have eyes in midline some of the session.  Patient pushing at times with L UE but did note some R UE activation when pt coughing during suctioning.  Patient with increased RR on 40-60% trach collar (as sats did drop to 86% initially), HR max 120.  Overall, however tolerated upright on EOB first time and off vent fairly well.  Feel he continues to be most appropriate for SNF level rehab.  PT to follow.   Follow Up Recommendations  SNF     Equipment Recommendations  Wheelchair (measurements PT);Wheelchair cushion (measurements PT);Hospital bed    Recommendations for Other Services       Precautions / Restrictions Precautions Precautions: Fall Precaution Comments: R hemi, trach, coretrach, flexiseal    Mobility  Bed Mobility Overal bed mobility: Needs Assistance Bed Mobility: Rolling;Supine to Sit;Sit to Supine Rolling: Max assist;+2 for physical assistance;+2 for safety/equipment   Supine to sit: Total assist;+2 for physical assistance;+2 for safety/equipment Sit to supine: Total assist;+2 for physical assistance;+2 for safety/equipment   General bed mobility comments: pt requires total +2 to progress to  L side of bed. pt pushing with L UE once on elbow to (A) iwth trunk activaiton noted. pt sitting eob with R side lean. pt noted to have drainage from foley at this time. pt worked on Firefighter. pt required extended time due to vital increase RN at bedside to increased oxygen saturation  Transfers                 General transfer comment: unable to attempt   Ambulation/Gait                 Stairs             Wheelchair Mobility    Modified Rankin (Stroke Patients Only) Modified Rankin (Stroke Patients Only) Pre-Morbid Rankin Score: No symptoms Modified Rankin: Severe disability     Balance Overall balance assessment: Needs assistance Sitting-balance support: Bilateral upper extremity supported;Feet supported Sitting balance-Leahy Scale: Zero Sitting balance - Comments: R lateral lean Sitting EOB with support x about 8-10 minutes working on eyes open to attend to photos, looking out window, gaze to midline and breathing off vent. Postural control: Posterior lean;Right lateral lean                                  Cognition Arousal/Alertness: Awake/alert Behavior During Therapy: Flat affect;Impulsive;Restless Overall Cognitive Status: Impaired/Different from baseline Area of Impairment: Attention;Following commands;Awareness;Problem solving;Safety/judgement                   Current Attention Level: Focused   Following  Commands: Follows one step commands inconsistently;Follows one step commands with increased time Safety/Judgement: Decreased awareness of safety;Decreased awareness of deficits   Problem Solving: Slow processing;Decreased initiation;Difficulty sequencing General Comments: pt with bil eyes opened once sitting the entire upright time. pt with eyes closed in supine. pt pushing with L UE. pt reaching for lines at times during session. pt noted to have R Ue movement in response to inline suction from RN and  coughing      Exercises      General Comments General comments (skin integrity, edema, etc.): in-line trach collar @ 40$ FiO2 RN increaed to 60% during session as SpO2 down to 86%, RR 31, HR 87-120, RN suctioning due to copious secretions. Using some accessory muscles in sitting      Pertinent Vitals/Pain Pain Assessment: Faces Faces Pain Scale: Hurts little more Pain Location: grimacing with mobility and hygiene after flexiseal leaked Pain Descriptors / Indicators: Grimacing Pain Intervention(s): Limited activity within patient's tolerance;Repositioned;Monitored during session    Home Living                      Prior Function            PT Goals (current goals can now be found in the care plan section) Progress towards PT goals: Progressing toward goals    Frequency    Min 2X/week      PT Plan Current plan remains appropriate    Co-evaluation PT/OT/SLP Co-Evaluation/Treatment: Yes Reason for Co-Treatment: Complexity of the patient's impairments (multi-system involvement);For patient/therapist safety;To address functional/ADL transfers;Necessary to address cognition/behavior during functional activity PT goals addressed during session: Balance;Mobility/safety with mobility        AM-PAC PT "6 Clicks" Mobility   Outcome Measure  Help needed turning from your back to your side while in a flat bed without using bedrails?: Total Help needed moving from lying on your back to sitting on the side of a flat bed without using bedrails?: Total Help needed moving to and from a bed to a chair (including a wheelchair)?: Total Help needed standing up from a chair using your arms (e.g., wheelchair or bedside chair)?: Total Help needed to walk in hospital room?: Total Help needed climbing 3-5 steps with a railing? : Total 6 Click Score: 6    End of Session Equipment Utilized During Treatment: Oxygen Activity Tolerance: Patient limited by fatigue Patient left: in  bed;with call bell/phone within reach;with bed alarm set;with restraints reapplied   PT Visit Diagnosis: Difficulty in walking, not elsewhere classified (R26.2);Hemiplegia and hemiparesis Hemiplegia - Right/Left: Right Hemiplegia - dominant/non-dominant: Dominant Hemiplegia - caused by: Nontraumatic intracerebral hemorrhage     Time: 1000-1039 PT Time Calculation (min) (ACUTE ONLY): 39 min  Charges:  $Therapeutic Activity: 8-22 mins                     Sheran Lawless, [place] [facility] [phone] 07/17/2019    Elray Mcgregor 07/17/2019, 6:33 PM

## 2019-07-17 NOTE — Procedures (Signed)
   Procedure Note  Date: 07/17/2019  Procedure: esophagogastroduodenoscopy (EGD) and percutaneous endoscopic gastrostomy (PEG) tube placement  Pre-op diagnosis: dysphagia, malnutrition Post-op diagnosis: same  Indication and clinical history: 30M s/p stroke with dysphagia  Surgeon: Jesusita Oka, MD Assistant: Rayburn, PA  Anesthesia: MAC, 46m versed, 1031m fentanyl  Findings: mildly atrophic gastric mucosa . Specimen: none . EBL: <5cc . Drains/Implants: PEG tube, 4cm at the skin   Disposition: ICU/PACU  Description of Procedure: The patient was positioned semi-recumbent. Time-out was performed verifying correct patient, procedure, signature of informed consent, and pre-operative antibiotics as indicated. MAC induction was uneventful and a bite block was placed into the oropharynx. The endoscope was inserted into the oropharynx and advanced down the esophagus into the stomach and into the duodenum. The visualized esophagus and duodenum were unremarkable. The endoscope was retracted back into the stomach and the stomach was insufflated. The stomach was inspected and was also normal. Transillumination was performed. The light was visible on the external skin and dimpling of the stomach was noted endoscopically with manual pressure. The abdomen was prepped and draped in the usual sterile fashion. Transillumination and dimpling were repeated and local anesthetic was infiltrated to make a skin wheal at the site of transillumination. The needle was inserted perpendicularly to the skin and the tip of the needle was visualized endoscopically. As the needle was retracted, the tract was also anesthetized. A skin nick was made at the site of the wheal and an introducer needle and sheath were inserted. The needle was removed and guidewire inserted. The guidewire was grasped by an endoscopic snare and the snare, guidewire, and endoscope retracted out of the oropharynx. The PEG tube was secured to the  guidewire and retracted through the mouth and esophagus into the stomach. The PEG tube was secured with a bolster and was visualized endoscopically to spin freely circumferentially and also be without gaps between the internal bumper and the stomach wall. There was no evidence of bleeding. The PEG bolster was secured at 4cm at the skin and there were no gaps between the bolster and the abdominal wall. The stomach was desufflated endoscopically and the endoscope removed. The bite block was also removed. The patient tolerated the procedure well and there were no complications.   The patient may have water and medications administered via the PEG tube beginning immediately and tube feeds may be initiated four hours post-procedure.    AyJesusita OkaMD General and TrDeweeseurgery

## 2019-07-17 NOTE — Progress Notes (Signed)
Patient ID: Lee Hooper, male   DOB: [birth year], 49 y.o.   MRN: [id number] BP 134/86 (BP Location: Right Arm)   Pulse (!) 103   Temp 98.6 F (37 C) (Oral)   Resp (!) 29   Ht 6' (1.829 m)   Wt 101.1 kg   SpO2 95%   BMI 30.23 kg/m  Responds to voice Aphasic Moving left side well, no movement on the right Wound is clean, dry, no signs of infection No near term plans to replace bone, will consider once swelling subsides

## 2019-07-17 NOTE — NC FL2 (Addendum)
[place] LEVEL OF CARE SCREENING TOOL     IDENTIFICATION  Patient Name: Lee Hooper Birthdate: [birth year] Sex: male Admission Date (Current Location): 07/01/2019  [facility] and [place] Number:  Herbalist and Address:  [facility] [place] [address], [place], [place] [address]      Provider Number: [id number]  Attending Physician Name and Address:  Garvin Fila, MD  Relative Name and Phone Number:  Bear, Osten (Spouse) [phone] [facility])    Current Level of Care: Hospital Recommended Level of Care: [place] Prior Approval Number:    Date Approved/Denied:   PASRR Number: [id number]  Discharge Plan: SNF    Current Diagnoses: Patient Active Problem List   Diagnosis Date Noted   Acute respiratory failure ([place])    Stroke ([place]) 07/01/2019   Middle cerebral artery embolism, left 07/01/2019   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 Incision right neck. Incision head)                       Personal Care Assistance Level of Assistance  Bathing, Feeding, Dressing Bathing Assistance: Maximum assistance Feeding assistance: Maximum assistance Dressing Assistance: Maximum assistance     Functional Limitations Info  Sight, Hearing, Speech Sight Info: Adequate Hearing Info: Adequate Speech Info: Adequate    SPECIAL CARE FACTORS FREQUENCY  PT (By licensed PT), OT (By licensed OT)     PT Frequency: 5xweek OT  Frequency: 5x/week            Contractures Contractures Info: Not present    Additional Factors Info  Code Status, Allergies Code Status Info: Full code Allergies Info: NKA           Current Medications (07/17/2019):  This is the current hospital active medication list Current Facility-Administered Medications  Medication Dose Route Frequency Provider Last Rate Last Admin    stroke: mapping our early stages of recovery book   Does not apply Once Ashok Pall, MD       0.9 %  sodium chloride infusion   Intravenous PRN Rosalin Hawking, MD   Stopped at 07/17/19 [phone]   acetaminophen (TYLENOL) tablet 650 mg  650 mg Per Tube Q4H PRN Rosalin Hawking, MD       Or   acetaminophen (TYLENOL) 160 MG/5ML solution 650 mg  650 mg Per Tube Q4H PRN Rosalin Hawking, MD   650 mg at 07/15/19 1222   Or   acetaminophen (TYLENOL) suppository 650 mg  650 mg Rectal Q4H PRN Rosalin Hawking, MD       aspirin chewable tablet 81 mg  81 mg Per Tube Daily Burnetta Sabin L, NP   81 mg at 07/16/19 0938   bisacodyl (DULCOLAX) suppository 10 mg  10 mg Rectal Daily PRN Merlene Laughter F, NP   10 mg at 07/08/19 1820   budesonide (PULMICORT) nebulizer solution 0.5 mg  0.5 mg Nebulization BID Kara Mead V, MD   0.5 mg at 07/17/19 0845   ceFAZolin (ANCEF) IVPB 2g/100 mL premix  2 g Intravenous Q8H  Paulita Fujita B, NP       chlorhexidine gluconate (MEDLINE KIT) (PERIDEX) 0.12 % solution 15 mL  15 mL Mouth Rinse BID Ashok Pall, MD   15 mL at 07/17/19 3754   Chlorhexidine Gluconate Cloth 2 % PADS 6 each  6 each Topical Daily Aroor, Lanice Schwab, MD   6 each at 07/16/19 2141   dextrose 5 % solution   Intravenous Continuous Donzetta Starch, NP 50 mL/hr at 07/17/19 1000 Rate Verify at 07/17/19 1000   fentaNYL (SUBLIMAZE) injection 25-100 mcg  25-100 mcg Intravenous Q1H PRN Kipp Brood, MD   100 mcg at 07/15/19 0849   free water 200 mL  200 mL Per Tube Q2H Jennelle Human B, NP   200 mL at 07/16/19 2144   iohexol (OMNIPAQUE) 300 MG/ML  solution 100 mL  100 mL Intravenous Once PRN Jacqulynn Cadet, MD       ipratropium-albuterol (DUONEB) 0.5-2.5 (3) MG/3ML nebulizer solution 3 mL  3 mL Nebulization Q6H PRN Merlene Laughter F, NP   3 mL at 07/12/19 0754   MEDLINE mouth rinse  15 mL Mouth Rinse 10 times per day Ashok Pall, MD   15 mL at 07/17/19 0925   metoprolol tartrate (LOPRESSOR) 25 mg/10 mL oral suspension 12.5 mg  12.5 mg Per Tube BID Magdalen Spatz, NP   12.5 mg at 07/16/19 2141   metoprolol tartrate (LOPRESSOR) injection 2.5-5 mg  2.5-5 mg Intravenous Q3H PRN Erick Colace, NP   5 mg at 07/15/19 3606   multivitamin liquid 15 mL  15 mL Per Tube Daily Rosalin Hawking, MD   15 mL at 07/16/19 0939   naloxone (NARCAN) injection 0.08 mg  0.08 mg Intravenous PRN Ashok Pall, MD       ondansetron [facility]) tablet 4 mg  4 mg Per Tube Q4H PRN Rosalin Hawking, MD       Or   ondansetron [facility]) injection 4 mg  4 mg Intravenous Q4H PRN Rosalin Hawking, MD       pantoprazole sodium (PROTONIX) 40 mg/20 mL oral suspension 40 mg  40 mg Per Tube Daily Agarwala, Einar Grad, MD   40 mg at 07/16/19 0942   promethazine (PHENERGAN) tablet 12.5-25 mg  12.5-25 mg Per Tube Q4H PRN Aroor, Lanice Schwab, MD       sennosides (SENOKOT) 8.8 MG/5ML syrup 5 mL  5 mL Per Tube QHS PRN Rigoberto Noel, MD         Discharge Medications: Please see discharge summary for a list of discharge medications.  Relevant Imaging Results:  Relevant Lab Results:   Additional Information SSN [id number]  Bethann Berkshire, LCSW   I have personally obtained history,examined this patient, reviewed notes, independently viewed imaging studies, participated in medical decision making and plan of care.ROS completed by me personally and pertinent positives fully documented  I have made any additions or clarifications directly to the above note. Agree with note above.    Antony Contras, MD Medical Director [facility] Pager: [phone] 07/17/2019 2:32 PM

## 2019-07-17 NOTE — TOC Initial Note (Signed)
Transition of Care [facility]) - Initial/Assessment Note    Patient Details  Name: Lee Hooper MRN: [id number] Date of Birth: 12/26/1970  Transition of Care [facility]) CM/SW Contact:    Erin Sons, LCSW Phone Number: 07/17/2019, 1:38 PM  Clinical Narrative:                  CSW called patients spouse, Visconti, Sabrinia, and informed pt of recommendation for SNF. Ignacia [facility]ley is okay with CSW faxing out bed requests. Family lives in [place].   FL2 completed and SNF bed requests sent.   Expected Discharge Plan: Skilled Nursing Facility Barriers to Discharge: Continued Medical Work up   Patient Goals and CMS Choice        Expected Discharge Plan and Services Expected Discharge Plan: Skilled Nursing Facility   Discharge Planning Services: CM Consult   Living arrangements for the past 2 months: Single Family Home                                      Prior Living Arrangements/Services Living arrangements for the past 2 months: Single Family Home Lives with:: Spouse Patient language and need for interpreter reviewed:: Yes        Need for Family Participation in Patient Care: Yes (Comment) Care giver support system in place?: Yes (comment)   Criminal Activity/Legal Involvement Pertinent to Current Situation/Hospitalization: No - Comment as needed  Activities of Daily Living   ADL Screening (condition at time of admission) Patient's cognitive ability adequate to safely complete daily activities?: No Is the patient deaf or have difficulty hearing?: No Does the patient have difficulty seeing, even when wearing glasses/contacts?: Yes Does the patient have difficulty concentrating, remembering, or making decisions?: Yes Patient able to express need for assistance with ADLs?: No Does the patient have difficulty dressing or bathing?: Yes Independently performs ADLs?: No Does the patient have difficulty walking or climbing stairs?: Yes Weakness of Legs: Both Weakness of  Arms/Hands: Both  Permission Sought/Granted                  Emotional Assessment Appearance:: Appears stated age Attitude/Demeanor/Rapport: Unable to Assess Affect (typically observed): Unable to Assess        Admission diagnosis:  Acute respiratory failure (HCC) [J96.00] Pneumonia [J18.9] Stroke [facility]) [I63.9] Encounter for imaging to screen for metal prior to MRI [Z13.89] Middle cerebral artery embolism, left [I66.02] Cerebrovascular accident (CVA) due to embolism of left carotid artery (HCC) [I63.132] Cerebrovascular accident (CVA), unspecified mechanism (HCC) [I63.9] 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 Acute respiratory failure (HCC)   . Stroke (HCC) 07/01/2019  . Middle cerebral artery embolism, left 07/01/2019  . 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

## 2019-07-18 ENCOUNTER — Inpatient Hospital Stay (HOSPITAL_COMMUNITY): Payer: Medicaid Other

## 2019-07-18 DIAGNOSIS — Z431 Encounter for attention to gastrostomy: Secondary | ICD-10-CM

## 2019-07-18 DIAGNOSIS — A498 Other bacterial infections of unspecified site: Secondary | ICD-10-CM

## 2019-07-18 DIAGNOSIS — Z430 Encounter for attention to tracheostomy: Secondary | ICD-10-CM

## 2019-07-18 LAB — BASIC METABOLIC PANEL
Anion gap: 9 (ref 5–15)
BUN: 28 mg/dL — ABNORMAL HIGH (ref 6–20)
CO2: 25 mmol/L (ref 22–32)
Calcium: 9.5 mg/dL (ref 8.9–10.3)
Chloride: 115 mmol/L — ABNORMAL HIGH (ref 98–111)
Creatinine, Ser: 0.93 mg/dL (ref 0.61–1.24)
GFR calc Af Amer: >60 mL/min (ref >60)
GFR calc non Af Amer: >60 mL/min (ref >60)
Glucose, Bld: 119 mg/dL — ABNORMAL HIGH (ref 70–99)
Potassium: 4.2 mmol/L (ref 3.5–5.1)
Sodium: 149 mmol/L — ABNORMAL HIGH (ref 135–145)

## 2019-07-18 LAB — CBC
HCT: 39.6 % (ref 39.0–52.0)
Hemoglobin: 12 g/dL — ABNORMAL LOW (ref 13.0–17.0)
MCH: 28.8 pg (ref 26.0–34.0)
MCHC: 30.3 g/dL (ref 30.0–36.0)
MCV: 95.2 fL (ref 80.0–100.0)
Platelets: 133 10*3/uL — ABNORMAL LOW (ref 150–400)
RBC: 4.16 MIL/uL — ABNORMAL LOW (ref 4.22–5.81)
RDW: 14.8 % (ref 11.5–15.5)
WBC: 12.1 10*3/uL — ABNORMAL HIGH (ref 4.0–10.5)
nRBC: 0.3 % — ABNORMAL HIGH (ref 0.0–0.2)

## 2019-07-18 LAB — GLUCOSE, CAPILLARY
Glucose-Capillary: 101 mg/dL — ABNORMAL HIGH (ref 70–99)
Glucose-Capillary: 112 mg/dL — ABNORMAL HIGH (ref 70–99)
Glucose-Capillary: 109 mg/dL — ABNORMAL HIGH (ref 70–99)
Glucose-Capillary: 106 mg/dL — ABNORMAL HIGH (ref 70–99)
Glucose-Capillary: 111 mg/dL — ABNORMAL HIGH (ref 70–99)
Glucose-Capillary: 92 mg/dL (ref 70–99)

## 2019-07-18 MED ORDER — FREE WATER
200.0000 mL | Freq: Three times a day (TID) | Status: DC
  Administered 2019-07-18 – 2019-07-20 (×6): 200 mL

## 2019-07-18 MED ORDER — IPRATROPIUM-ALBUTEROL 0.5-2.5 (3) MG/3ML IN SOLN
3.0000 mL | Freq: Four times a day (QID) | RESPIRATORY_TRACT | Status: DC
  Administered 2019-07-18 (×2): 3 mL via RESPIRATORY_TRACT
  Filled 2019-07-18 (×2): qty 3

## 2019-07-18 NOTE — Progress Notes (Signed)
Trauma/Critical Care Follow Up Note  Subjective:    Overnight Issues:   Objective:  Vital signs for last 24 hours: Temp:  [98.5 F (36.9 C)-99.9 F (37.7 C)] 99.4 F (37.4 C) (05/15 0333) Pulse Rate:  [86-106] 89 (05/15 0805) Resp:  [23-36] 28 (05/15 0805) BP: (103-135)/(65-93) 125/72 (05/15 0805) SpO2:  [93 %-100 %] 96 % (05/15 0805) FiO2 (%):  [40 %] 40 % (05/15 0805) Weight:  [102.9 kg] 102.9 kg (05/15 0500)  Hemodynamic parameters for last 24 hours:    Intake/Output from previous day: 05/14 0701 - 05/15 0700 In: 3625.5 [I.V.:1216.3; NG/GT:2109.2; IV Piggyback:300] Out: 1925 [Urine:1925]  Intake/Output this shift: Total I/O In: 612.1 [I.V.:212.1; NG/GT:300; IV Piggyback:100] Out: -   Vent settings for last 24 hours: FiO2 (%):  [40 %] 40 %  Physical Exam:  Gen: comfortable, no distress Neuro: does not follow commands Neck: supple CV: RRR Pulm: unlabored breathing Abd: soft, NT, PEG at 4 at the skin, binder in place GU: clear yellow urine Extr: wwp, no edema   Results for orders placed or performed during the hospital encounter of 07/01/19 (from the past 24 hour(s))  Glucose, capillary     Status: Abnormal   Collection Time: 07/17/19 11:36 AM  Result Value Ref Range   Glucose-Capillary 120 (H) 70 - 99 mg/dL  Glucose, capillary     Status: None   Collection Time: 07/17/19  3:44 PM  Result Value Ref Range   Glucose-Capillary 96 70 - 99 mg/dL  Glucose, capillary     Status: Abnormal   Collection Time: 07/17/19  7:18 PM  Result Value Ref Range   Glucose-Capillary 109 (H) 70 - 99 mg/dL  Glucose, capillary     Status: Abnormal   Collection Time: 07/17/19 11:34 PM  Result Value Ref Range   Glucose-Capillary 127 (H) 70 - 99 mg/dL  CBC     Status: Abnormal   Collection Time: 07/18/19  2:54 AM  Result Value Ref Range   WBC 12.1 (H) 4.0 - 10.5 K/uL   RBC 4.16 (L) 4.22 - 5.81 MIL/uL   Hemoglobin 12.0 (L) 13.0 - 17.0 g/dL   HCT 24.2 35.3 - 61.4 %   MCV  95.2 80.0 - 100.0 fL   MCH 28.8 26.0 - 34.0 pg   MCHC 30.3 30.0 - 36.0 g/dL   RDW 43.1 54.0 - 08.6 %   Platelets 133 (L) 150 - 400 K/uL   nRBC 0.3 (H) 0.0 - 0.2 %  Basic metabolic panel     Status: Abnormal   Collection Time: 07/18/19  2:54 AM  Result Value Ref Range   Sodium 149 (H) 135 - 145 mmol/L   Potassium 4.2 3.5 - 5.1 mmol/L   Chloride 115 (H) 98 - 111 mmol/L   CO2 25 22 - 32 mmol/L   Glucose, Bld 119 (H) 70 - 99 mg/dL   BUN 28 (H) 6 - 20 mg/dL   Creatinine, Ser 7.61 0.61 - 1.24 mg/dL   Calcium 9.5 8.9 - 95.0 mg/dL   GFR calc non Af Amer >60 >60 mL/min   GFR calc Af Amer >60 >60 mL/min   Anion gap 9 5 - 15  Glucose, capillary     Status: Abnormal   Collection Time: 07/18/19  3:31 AM  Result Value Ref Range   Glucose-Capillary 101 (H) 70 - 99 mg/dL  Glucose, capillary     Status: Abnormal   Collection Time: 07/18/19  8:01 AM  Result Value Ref Range  Glucose-Capillary 112 (H) 70 - 99 mg/dL    Assessment & Plan: The plan of care was discussed with the bedside nurse for the day, Susie, who is in agreement with this plan and no additional concerns were raised.   Present on Admission: . Stroke ([place]) . Middle cerebral artery embolism, left . 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   and I reviewed the patients new imaging test results.    PEG placement 5/14 - tube in good position, 4 at the skin. Abdominal binder in place at all times. If tube is accidentally removed before 5/21, please call surgeon on call immediately as this is a surgical emergency. Okay to use tube for meds, water flushes, and tube feeds without restriction from surgical perspective, however defer decision-making to primary team. Please call for any additional questions/concerns.    Jesusita Oka, MD Trauma & General Surgery Please use [email or web address] to contact on call  provider  07/18/2019  *Care during the described time interval was provided by me. I have reviewed this patient's available data, including medical history, events of note, physical examination and test results as part of my evaluation.

## 2019-07-18 NOTE — Progress Notes (Signed)
Patient ID: Lee Hooper, male   DOB: 05-31-1970, 49 y.o.   MRN: [id number] Seems stable.  Nurses state he is starting to follow an occasional command.  Wound clean dry and intact.

## 2019-07-18 NOTE — Progress Notes (Addendum)
NAME:  Lee Hooper, MRN:  [id number], DOB:  Dec 05, 1970, LOS: 17 ADMISSION DATE:  07/01/2019, CONSULTATION DATE:  07/01/19 REFERRING MD:  Aroor MD, CHIEF COMPLAINT:  CVA    Brief History   Lee Hooper is a 49 yo M who presented as a code stroke after being found aphasic and nonverbal by his wife. CT/CTA studies showed left ICA/MCA thrombosis and pt was taken for IR thrombectomy for left ICA and left M1 occlusions. Repeat head CT was obtained following the procedure out of concern for possible hemorrhage and showed moderate basal ganglia hemorrhage vs stroke. Repeat MRI revealed that the ICA and MCA re-occluded with change in FLAIR sequence excluding him from repeat thrombectomy. He underwent a L hemicraniectomy. He was intubated for airway protection.  Past Medical History  Psoriasis  COPD  Tobacco Use  Significant Hospital Events   4/28: Admit; L ICA/MCA Thrombectomy with re-occlusion --> left hemicraniectomy, intubated for acute hypoxic respiratory failure  5/3: NAE. Was taken off precedex yesterday and pt opened his eyes. Prednisone 50 mg qd was begun. He had a persistent fever around 38.2, HR 110s, BP 140s/60s. Pt required increased O2 with secretions; CXR showed mild edema with small effusions c/f aspiration vs opacification. Blood culture x2 and urine culture collected, NGTD. ABG pending. Currently satting 92% on PRVC FiO2 80, PEEP 10, Plateau 22. WBC declined to 10.5 from 11.1, Na 151 from 156. Tube feeds were held d/t distended colonic loop in RLQ on KUB, to be continued today. Thrombocytopenia improved to 93 from 81.  5/4: Patient remains intubated with spontaneous movement of left arm and leg.  5/5: NAE. Pt remains intubated on PRN fetanyl. Will open eyes and spontaneously move left leg and arm.  5/6: NAE overnight. Intubated with no sedation. 5/7: NAE. Marland Kitchen Completed Unasyn yesterday. VSS, spO2 remains in low 90s the majority of the time on 620/40%/+5. Morning labs significant  for HCO3 of 19. Otherwise unremarkable.  5/8: Ongoing fever.  Obtained ultrasound, positive for lower extremity DVT bilaterally.  Tracheostomy placed at bedside.,  Did have some SVT this responded to treating fever and rate control. 5/9: IVC filter placed 5/11: no acute events overnight. Pt with similar exam as previous however tachycardic and febrile (tmax102.7) with a bit increased wbc and new R infiltrate noted in base. Pan cx and start empiric abx. 5/12 Tolerated 8 hours of trach collar   Consults:  IR PCCM  Neuro (Primary) Neurosurgery  Procedures:  4/28 IR - L ICA/MCA Thrombectomy 4/28 L hemicraniectomy 4/28 Intubation 5/8  Trach   Significant Diagnostic Tests:  4/28 Admit CT Head Code Stroke:  Hyperdense left ICA and MCA compatible with acute thrombus. No acute infarct or hemorrhage.  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 The patient has minimal atherosclerotic disease. The left internal carotid artery is occluded through the terminus and extending in the left M1 M2 and M3 branches. There is poor collateral circulation on the left. There is a large territory infarct involving the left hemisphere. Infarct volume 266 mL.  4/28 Admit MR Brain:  Restricted diffusion throughout much of the left MCA vascular territory consistent with acute ischemia. Restricted diffusion consistent with acute ischemia is also present within the paramedian left frontoparietal lobes, although this is less well assessed due to the degree of motion degradation at the level of the vertex. There is little if any corresponding T2/FLAIR hyperintensity at these  sites. No significant mass effect. No midline shift.  4/28 Post Thrombectomy CT Head: : 1. 3.0 x 2.6 x 3.9 cm region of hyperdensity centered within the left basal ganglia, left subinsular region and inferomedial left temporal lobe likely reflecting a  combination of parenchymal hematoma and contrast staining.  2. Edema with loss of gray-white differentiation within the left basal ganglia, left insula and anterior left temporal lobe, likely acute infarction. Subtle changes of acute infarction are also suspected within the paramedian left frontal lobe ACA vascular territory. 3. Scattered small-volume subarachnoid hemorrhage along the left cerebral hemisphere. 4. Regional mass effect with effacement of the left lateral ventricle temporal horn. No midline shift.  4/28 Post Thrombectomy MR Brain:  1. Restricted diffusion throughout the majority of the left MCA and ACA vascular territories consistent with acute ischemia. 2. No significant mass effect at this time. No midline shift. 3. The acute parenchymal hemorrhage and/or contrast staining centered within left basal ganglia and inferomedial left temporal lobe on prior head CT does not appear significantly changed. Based on the MR appearance, it is suspected that a significant component of this previously demonstrated hyperdensity reflects contrast staining. 4. Redemonstrated small volume subarachnoid hemorrhage overlying the left cerebral hemisphere. Small volume subarachnoid hemorrhage is also questioned along the right cerebral hemisphere posteriorly.  4/29 Post Hemicraniectomy CT head:  Complete left ACA/MCA territory infarction with swollen brain bulging through craniectomy defect. No midline shift or entrapment. Petechial hemorrhage at left basal ganglia. Extraaxial hemorrhage along surface of the infarct.   5/1 Head CT > Slightly increased rightward midline shift with a small amount of herniation beneath the anterior falx. Otherwise unchanged Examination.  5/6 Head CT: Left ACA and MCA territory infarct with unchanged degree of subarachnoid hemorrhage. Swelling has progressed with greater herniation through the craniectomy defect and 5 mm of midline shift.  5/8: Bilateral acute DVT  involving the right common femoral vein right proximal profundal vein and right popliteal vein also left DVT involving same vessels  5/12 Renal US: Technically challenging exam due to patient's intubated status and difficulty with repositioning.  Diffusely increased cortical echogenicity in both kidneys compatible with medical renal disease.  Increased echogenicity of the liver with diminished through transmission, most often reflective of hepatic steatosis.  Micro Data:  4/28: SARS-CoV-2/Influenza A/Influenza B PCR: Negative  4/30: Respiratory culture: Abundant Moraxella Catarrhalis, Haemophilus Influenzae, beta lactamase positive  5/2: Urine culture: NGTD  5/2: BC x2: NGTD 5/7 resp >> H flu 5/7 bld >> ng 5/11 BCx2 >> 5/11 trach asp >> E coli  Antimicrobials:  4/30 - 5/2: Cefepime 5/2 - 5/4: Augmentin  5/5 - 5/6 unasyn   Vanc 5/11 >> 5/13 Cefepime 5/11 >> 5/13 Ancef 5/14 >>   Interim history/subjective:  Fever curve better.  Still has respiratory secretions.  Off vent since 5/14.  Objective   Blood pressure 125/72, pulse 89, temperature 99.4 F (37.4 C), temperature source Axillary, resp. rate (!) 28, height 6' (1.829 m), weight 102.9 kg, SpO2 96 %.    FiO2 (%):  [40 %] 40 %   Intake/Output Summary (Last 24 hours) at 07/18/2019 0903 Last data filed at 07/18/2019 0800 Gross per 24 hour  Intake 3282.18 ml  Output 1925 ml  Net 1357.18 ml   Filed Weights   07/10/19 0500 07/17/19 0338 07/18/19 0500  Weight: 108.5 kg 101.1 kg 102.9 kg    Examination:  General - somnelent Eyes - pupils reactive ENT - trach site clean Cardiac - regular rate/rhythm, no murmur  Chest - scattered rhonchi Abdomen - soft, non tender, G tube in place Extremities - no cyanosis, clubbing, or edema Skin - no rashes Neuro - opens eyes with stimulation   Resolved Hospital Problem list   treated HCAP with Moraxella and Haemophilus influenza, these were beta-lactamase positive, antibiotics were  completed 5/6; COPD exacerbation. Ileus, AKI, hypernatremia  Assessment & Plan:   Acute hypoxic respiratory failure with compromised airway. Failure to wean s/p tracheostomy. Hx of smoking with presumed COPD. - has been on trach collar since 5/14 - goal SpO2 > 92% - trach care - continue duoneb  E coli HCAP. - day 5 of ABx, currently on ancef - f/u CXR 5/16  Large Lt ICA/MCA CVA s/p thrombectomy and decompressive hemicraniectomy 2nd to cerebral edema and hemorrhage. - per neurology and neurosurgery  B/l lower extremity DVT. - s/p IVC filter 5/09  SVT. - continue lopressor  A total of  37 minutes spent addressing patient care issues on day of visit.  Best practice:  Diet: Tube Feeds  DVT prophylaxis:  IVC filter 5/9 GI prophylaxis: Protonix Mobility: PT/ OT Code Status: FULL Disposition: okay to transfer to progressive care from PCCM standpoint >> defer to neurology  Labs:   CMP Latest Ref Rng & Units 07/18/2019 07/17/2019 07/16/2019  Glucose 70 - 99 mg/dL 119(H) 116(H) 121(H)  BUN 6 - 20 mg/dL 28(H) 35(H) 48(H)  Creatinine 0.61 - 1.24 mg/dL 0.93 1.06 1.20  Sodium 135 - 145 mmol/L 149(H) 150(H) 154(H)  Potassium 3.5 - 5.1 mmol/L 4.2 3.8 4.4  Chloride 98 - 111 mmol/L 115(H) 118(H) 121(H)  CO2 22 - 32 mmol/L 25 25 24   Calcium 8.9 - 10.3 mg/dL 9.5 9.5 9.4  Total Protein 6.5 - 8.1 g/dL - 6.1(L) 6.4(L)  Total Bilirubin 0.3 - 1.2 mg/dL - 0.7 0.9  Alkaline Phos 38 - 126 U/L - 62 63  AST 15 - 41 U/L - 85(H) 80(H)  ALT 0 - 44 U/L - 156(H) 118(H)    CBC Latest Ref Rng & Units 07/18/2019 07/17/2019 07/16/2019  WBC 4.0 - 10.5 K/uL 12.1(H) 9.7 11.0(H)  Hemoglobin 13.0 - 17.0 g/dL 12.0(L) 11.8(L) 11.8(L)  Hematocrit 39.0 - 52.0 % 39.6 39.0 38.8(L)  Platelets 150 - 400 K/uL 133(L) 132(L) 135(L)    ABG    Component Value Date/Time   PHART 7.420 07/15/2019 0544   PCO2ART 37.8 07/15/2019 0544   PO2ART 102 07/15/2019 0544   HCO3 24.4 07/15/2019 0544   TCO2 26 07/15/2019 0544     ACIDBASEDEF 3.0 (H) 07/02/2019 0806   O2SAT 98.0 07/15/2019 0544    CBG (last 3)  Recent Labs    07/17/19 2334 07/18/19 0331 07/18/19 0801  GLUCAP 127* 101* 112*    Signature:  Chesley Mires, MD [place] Pager - [phone] 07/18/2019, 9:12 AM

## 2019-07-18 NOTE — Progress Notes (Signed)
STROKE TEAM PROGRESS NOTE   INTERVAL HISTORY RN at bedside. Pt had PEG yesterday and now on tube feeding. Still on trach with copious secretions. RR 25-30. Na 149 today. On tube feeding and free water. Afebrile and on Ancef. WBC 12.1. following commands with wiggle toes on the left, but not following other commands.   Vitals:   07/18/19 0251 07/18/19 0300 07/18/19 0333 07/18/19 0400  BP:  127/80  118/77  Pulse: 92 92 88 86  Resp: (!) 27 (!) 26 (!) 25 (!) 31  Temp:   99.4 F (37.4 C)   TempSrc:   Axillary   SpO2: 96% 94% 96% 97%  Weight:      Height:       CBC:  Recent Labs  Lab 07/17/19 0528 07/18/19 0254  WBC 9.7 12.1*  HGB 11.8* 12.0*  HCT 39.0 39.6  MCV 95.4 95.2  PLT 132* 133*   Basic Metabolic Panel:  Recent Labs  Lab 07/16/19 0658 07/16/19 0658 07/17/19 0528 07/18/19 0254  NA 154*   < > 150* 149*  K 4.4   < > 3.8 4.2  CL 121*   < > 118* 115*  CO2 24   < > 25 25  GLUCOSE 121*   < > 116* 119*  BUN 48*   < > 35* 28*  CREATININE 1.20   < > 1.06 0.93  CALCIUM 9.4   < > 9.5 9.5  MG 2.7*  --  2.5*  --   PHOS 2.9  --  3.2  --    < > = values in this interval not displayed.    IMAGING past 24 hours No results found.  PHYSICAL EXAM  General - mildly obese middle-aged Caucasian male, s/p tracheostomy on ventilatory support  Ophthalmologic - fundi not visualized due to noncooperation.  Cardiovascular - Regular rate and rhythm.  Neuro - s/p tracheostomy on vent.  Off sedation, eyes closed but able to open with voice. Able to wiggle left toes on request but not following other commands.   left gaze preference position, barely cross midline, not blinking to visual threat consistently bilaterally, doll's eyes present, pupil left 37mm, right 25mm. Corneal reflex weak on the right and brisk on the left, gag and cough present. Breathing over the vent.  right facial droop.  Tongue protrusion not cooperative. Spontaneous and purposeful movement left UE 3+/5 and LE 2/5.  However, right UE and LE plegic. DTR 1+ and no babinski. Sensation, coordination and gait not tested.    ASSESSMENT/PLAN Lee Hooper is a 49 y.o. male with history of COPD, ongoing tobacco use, and psoriasis, presenting with dense LMCA syndrome. Attempt for Left ICA and Left M1 thrombectomy was done emergently with TICI 2b flow. Unfortunately, the pt had post procedure hemorrhage and reocclusion of the vessels. F/u imaging suggested early edema and pt underwent prophylactic decompressive hemicraniectomy.  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  Stroke etiology unknown - given DVT requiring eventual AC and high mRS, will not do further embolic workup  Code Stroke CT head showed early hypodensity in nearly entire LMCA. ASPECTS 6-7 per notes.  CTA head & neck occlusion cervical ICA on the left. This is most likely due to dissection. The  patient has minimal atherosclerotic disease.  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  There is poor collateral circulation on the left.   CTP CBF large core volume of 266 mL, perfusion volume 313 mL, mismatch volume 47 mL.  ASPECTS using CTP by Dr. Carlis Abbott 6 or 7.   IR - TICI2c revascularization  MRI  Restricted diffusion throughout the majority of the left MCA and ACA vascular territories consistent with acute ischemia.  MRA - reocclusion of the intracranial left internal carotid artery. No flow related signal is seen within the left middle cerebral artery.  CT Head 07/04/19 - Slightly increased rightward midline shift with a small amount of herniation beneath the anterior falx. Otherwise unchanged examination.  CT repeat  5/6 stable infarcts and SAH but increasing edema w/ midline shift now 41mm and herniation through crani site  2D Echo - EF 55 - 60%. No cardiac source of emboli identified.   LDL 45  HgbA1c 5.5  Subq heparin for VTE prophylaxis  No antithrombotic prior to admission, no antithrombotics d/t hemorrhagic conversion, now on aspirin 81. Consider AC in 2 mos  Therapy recommendations:  SNF  Disposition:  Pending - LTACH consulted however no insurance  Respiratory failure s/p Trach Trach wound  Intubated on vent  CCM on board  CXR 07/03/19- New consolidation in the right lower lobe and left infrahilar region concerning for pneumonia. Possible small layering right effusion.  CXR 5/3 mild pulm edema w/ atx vs infiltrate/aspiration  On cefepime 4/30>>5/2 for pneumonia  Augmentin 5/2>>5/4  Unasyn 5/5>>5.6 lasix  Trach placed 5/8 per CCM  Fever and elevated white count and tachycardia hence restarted on antibiotics on 07/14/2019-> Ancef started 07/17/19 for pneumonia  WOC consulted for trach wound  On trach collar intermittently  Cerebral edema Hypernatremia   MRI - Restricted diffusion throughout the majority of the left MCA and ACA vascular territories consistent with acute ischemia.  CT Head 07/04/19 - Slightly increased rightward midline shift with a small amount of herniation beneath the anterior falx. Otherwise unchanged examination.  S/p decompressive left hemicraniectomy  4/28 Dr Christella Noa  CT 5/6 - stable infarcts and SAH but increasing edema w/ midline shift now 73mm and herniation through crani site  On keppra. Plan 2 wk course per NSG  Off 3%. On D5NS @ 50 - off  Okay to allow Na gradually normalize  Na lowering 150->149  Free water 200 q8h + TF  Crani sutures out   Sepsis  Fever w/ leukocytosis HCAP  Afebrile->99.4  Leukocytosis - 11.0->9.7->12.1  UA neg  Sputum moraxella, H. flu   On cefepime 4/30>>5/2; Augmentin 5/2>>5/4, Unasyn 5/5>>5/6 ->  off  Blood culture 5/2 no growth  Blood cultures 5/7 - no growth   Resp cultures 5/7 - no growth  CXR 5/9 - streaky BLL opacilites atx/consolication/PNA. Possible R pleural effusion  CXR 5/10 w/ atx vs infiltrate  CXR 07/15/19 - Stable bibasilar atelectasis a small right pleural effusion. No new opacity evident. Stable cardiac silhouette  Sputum cx 5/11 reincubated  Blood Cx 5/11 no growth   Vanc 5/11>>, Cefepime 5/11>>Off  Ancef started 07/17/19 for pneumonia  Bilateral Lower Ext DVT, extensive Possible PE given tachycardia  Venous US 07/11/2019 DVT + bilateral extensive  IVC filter placed 5/9  On aspirin 81 now; consider AC in 2 mos   Hypertension  Home meds:  None  Current meds: none scheduled  BP stable  SBP  goal < 160 due to hemorrhagic conversion . Long-term BP goal normotensive  Dysphagia   NPO  Coretrak 5/7  PEG - trauma placement 5/14  On TF @ 50  Ileus, resolved  abd distension w/ decreased BS  KUB 5/2 distended colonic loop  Miralax   Flexiseal following laxatives  On reglan, laxatives on hold, decrease fentanyl  Coretrak 5/7  PEG 5/14  TF resumed after PEG  Acute Renal Failure    Renal US - increased cortical echogenicity in B kidneys c/w renal dz. Increased echogenicity of liver ?hepatic steatosis.  Urine studies - elevated protein creatinine ratio, Na normal, UN normal  Creatinine - 1.20->1.06->0.93  Diuresis - held   Tobacco abuse  Current smoker  Smoking cessation counseling will be provided if able  Other Stroke Risk Factors  Obesity, Body mass index is 30.23 kg/m., recommend weight loss, diet and exercise as appropriate    Likely undiagnosed obstructive sleep apnea based on body habitus  Other Active Problems  Code Status - Full code  Hypokalemia - resolved - 4.2  Thrombocytopenia - 132->133  Elevated LFT - AST/ALT 85/156 - repeat in am  Hospital day # 17  This patient is critically ill due to  respiratory failure s/p trach still with tachypnea and copious secretions, large left MCA ACA infarct with cerebral edema s/p hemicrani, extensive DVT bilaterally s/p IVC filter, illeus, fever and leukocytosis with pneumonia and at significant risk of neurological worsening, death form recurrent stroke, PE, sepsis, cerebral edema and brain herniation, illeus, seizure. This patient's care requires constant monitoring of vital signs, hemodynamics, respiratory and cardiac monitoring, review of multiple databases, neurological assessment, discussion with family, other specialists and medical decision making of high complexity. I spent 40 minutes of neurocritical care time in the care of this patient. I have discussed with Dr. Craige Cotta.   Marvel Plan, MD PhD Stroke Neurology 07/18/2019 10:42 AM  To contact Stroke Continuity provider, please refer to [email or web address]. After hours, contact General Neurology

## 2019-07-19 LAB — GLUCOSE, CAPILLARY
Glucose-Capillary: 108 mg/dL — ABNORMAL HIGH (ref 70–99)
Glucose-Capillary: 104 mg/dL — ABNORMAL HIGH (ref 70–99)
Glucose-Capillary: 128 mg/dL — ABNORMAL HIGH (ref 70–99)
Glucose-Capillary: 107 mg/dL — ABNORMAL HIGH (ref 70–99)
Glucose-Capillary: 118 mg/dL — ABNORMAL HIGH (ref 70–99)
Glucose-Capillary: 126 mg/dL — ABNORMAL HIGH (ref 70–99)

## 2019-07-19 LAB — CBC
HCT: 37.7 % — ABNORMAL LOW (ref 39.0–52.0)
Hemoglobin: 11.4 g/dL — ABNORMAL LOW (ref 13.0–17.0)
MCH: 29 pg (ref 26.0–34.0)
MCHC: 30.2 g/dL (ref 30.0–36.0)
MCV: 95.9 fL (ref 80.0–100.0)
Platelets: 136 10*3/uL — ABNORMAL LOW (ref 150–400)
RBC: 3.93 MIL/uL — ABNORMAL LOW (ref 4.22–5.81)
RDW: 14.7 % (ref 11.5–15.5)
WBC: 12.9 10*3/uL — ABNORMAL HIGH (ref 4.0–10.5)
nRBC: 0.5 % — ABNORMAL HIGH (ref 0.0–0.2)

## 2019-07-19 LAB — CULTURE, BLOOD (ROUTINE X 2)
Culture: NO GROWTH
Culture: NO GROWTH
Special Requests: ADEQUATE

## 2019-07-19 LAB — BASIC METABOLIC PANEL
Anion gap: 7 (ref 5–15)
BUN: 28 mg/dL — ABNORMAL HIGH (ref 6–20)
CO2: 25 mmol/L (ref 22–32)
Calcium: 9.3 mg/dL (ref 8.9–10.3)
Chloride: 114 mmol/L — ABNORMAL HIGH (ref 98–111)
Creatinine, Ser: 0.93 mg/dL (ref 0.61–1.24)
GFR calc Af Amer: >60 mL/min (ref >60)
GFR calc non Af Amer: >60 mL/min (ref >60)
Glucose, Bld: 126 mg/dL — ABNORMAL HIGH (ref 70–99)
Potassium: 3.7 mmol/L (ref 3.5–5.1)
Sodium: 146 mmol/L — ABNORMAL HIGH (ref 135–145)

## 2019-07-19 LAB — HEPATIC FUNCTION PANEL
ALT: 102 U/L — ABNORMAL HIGH (ref 0–44)
AST: 54 U/L — ABNORMAL HIGH (ref 15–41)
Albumin: 1.9 g/dL — ABNORMAL LOW (ref 3.5–5.0)
Alkaline Phosphatase: 75 U/L (ref 38–126)
Bilirubin, Direct: 0.2 mg/dL (ref 0.0–0.2)
Indirect Bilirubin: 0.3 mg/dL (ref 0.3–0.9)
Total Bilirubin: 0.5 mg/dL (ref 0.3–1.2)
Total Protein: 6 g/dL — ABNORMAL LOW (ref 6.5–8.1)

## 2019-07-19 MED ORDER — POTASSIUM CHLORIDE 20 MEQ/15ML (10%) PO SOLN
40.0000 meq | Freq: Once | ORAL | Status: AC
  Administered 2019-07-19: 40 meq
  Filled 2019-07-19: qty 30

## 2019-07-19 MED ORDER — IPRATROPIUM-ALBUTEROL 0.5-2.5 (3) MG/3ML IN SOLN
3.0000 mL | Freq: Four times a day (QID) | RESPIRATORY_TRACT | Status: DC
  Administered 2019-07-19 – 2019-07-22 (×16): 3 mL via RESPIRATORY_TRACT
  Filled 2019-07-19 (×16): qty 3

## 2019-07-19 MED ORDER — FUROSEMIDE 10 MG/ML IJ SOLN
40.0000 mg | Freq: Once | INTRAMUSCULAR | Status: AC
  Administered 2019-07-19: 40 mg via INTRAVENOUS
  Filled 2019-07-19: qty 4

## 2019-07-19 NOTE — Plan of Care (Signed)
  Problem: Ischemic Stroke/TIA Tissue Perfusion: Goal: Complications of ischemic stroke/TIA will be minimized Outcome: Progressing   

## 2019-07-19 NOTE — Progress Notes (Signed)
Patient ID: Lee Hooper, male   DOB: 09-May-1970, 49 y.o.   MRN: [id number] No change in neurologic exam.  Craniotomy site full.  Wound clean dry and intact.  Continues to be ventilated.  No new recommendations.

## 2019-07-19 NOTE — Progress Notes (Signed)
STROKE TEAM PROGRESS NOTE   INTERVAL HISTORY RN at bedside. Pt on trach collar, secretions much improved today and no SOB. On TF and FW. However, there are large amount of fluid likely CSF drainage from hemicrani site. Discussed with NSG Dr. Yetta Barre and he will take a look.    Vitals:   07/19/19 0300 07/19/19 0321 07/19/19 0400 07/19/19 0500  BP: 99/66 99/66 104/69 104/73  Pulse: 89 94 91 88  Resp: (!) 36 (!) 28 (!) 33 (!) 37  Temp:   99 F (37.2 C)   TempSrc:   Oral   SpO2: 95% 95% 95% 95%  Weight:    102.7 kg  Height:       CBC:  Recent Labs  Lab 07/18/19 0254 07/19/19 0504  WBC 12.1* 12.9*  HGB 12.0* 11.4*  HCT 39.6 37.7*  MCV 95.2 95.9  PLT 133* 136*   Basic Metabolic Panel:  Recent Labs  Lab 07/16/19 0658 07/16/19 0658 07/17/19 0528 07/18/19 0254  NA 154*   < > 150* 149*  K 4.4   < > 3.8 4.2  CL 121*   < > 118* 115*  CO2 24   < > 25 25  GLUCOSE 121*   < > 116* 119*  BUN 48*   < > 35* 28*  CREATININE 1.20   < > 1.06 0.93  CALCIUM 9.4   < > 9.5 9.5  MG 2.7*  --  2.5*  --   PHOS 2.9  --  3.2  --    < > = values in this interval not displayed.    IMAGING past 24 hours DG Chest Port 1 View  Result Date: 07/18/2019 CLINICAL DATA:  Possible pneumonia EXAM: PORTABLE CHEST 1 VIEW COMPARISON:  07/15/2019 FINDINGS: Cardiac shadow is stable. Tracheostomy tube is again seen and stable. Feeding catheter has been removed. Bibasilar atelectatic changes are seen but slightly improved when compared with the prior exam. No new focal infiltrate is noted. IMPRESSION: Slight improvement in bibasilar atelectasis when compared with the prior exam. Electronically Signed   By: Alcide Clever M.D.   On: 07/18/2019 12:22    PHYSICAL EXAM General - mildly obese middle-aged Caucasian male, s/p tracheostomy on trach collar  Ophthalmologic - fundi not visualized due to noncooperation.  Cardiovascular - Regular rate and rhythm.  Neuro - s/p tracheostomy on trach collar.  Off sedation,  eyes closed but easily open with voice. Did not following any commands today.   left gaze preference position, barely cross midline, not blinking to visual threat on the left, doll's eyes present, pupil left 72mm, right 4.78mm, both reactive to light. Corneal reflex weak on the right and brisk on the left, gag and cough present. Breathing over the vent.  right facial droop.  Tongue protrusion not cooperative. Spontaneous and purposeful movement left UE 3+/5 and LE 2/5. However, right UE and LE plegic. DTR 1+ and no babinski. Sensation, coordination and gait not tested.    ASSESSMENT/PLAN [name] with history of COPD, ongoing tobacco use, and psoriasis, presenting with dense LMCA syndrome. Attempt for Left ICA and Left M1 thrombectomy was done emergently with TICI 2b flow. Unfortunately, the pt had post procedure hemorrhage and reocclusion of the vessels. F/u imaging suggested early edema and pt underwent prophylactic decompressive hemicraniectomy.  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  Stroke etiology unknown - given DVT requiring eventual AC and high mRS, will not do further embolic workup  Code Stroke CT head showed early hypodensity in nearly entire LMCA. ASPECTS 6-7 per notes.  CTA head & neck occlusion cervical ICA on the left. This is most likely due to dissection. The patient has minimal atherosclerotic disease.  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  There is poor collateral circulation on the left.   CTP CBF large core volume of 266 mL, perfusion volume 313 mL, mismatch volume 47 mL.  ASPECTS using CTP by Dr. Carlis Abbott 6 or 7.    IR - TICI2c revascularization  MRI  Restricted diffusion throughout the majority of the left MCA and ACA vascular territories consistent with acute ischemia.  MRA - reocclusion of the intracranial left internal carotid artery. No flow related signal is seen within the left middle cerebral artery.  CT Head 07/04/19 - Slightly increased rightward midline shift with a small amount of herniation beneath the anterior falx. Otherwise unchanged examination.  CT repeat 5/6 stable infarcts and SAH but increasing edema w/ midline shift now 59mm and herniation through crani site  2D Echo - EF 55 - 60%. No cardiac source of emboli identified.   LDL 45  HgbA1c 5.5  Subq heparin for VTE prophylaxis  No antithrombotic prior to admission, no antithrombotics d/t hemorrhagic conversion, now on aspirin 81. Consider AC in 2 mos  Therapy recommendations:  SNF  Disposition:  Pending - LTACH consulted however no insurance  Respiratory failure s/p Trach Trach wound  Intubated on vent  CCM on board  CXR 07/03/19- New consolidation in the right lower lobe and left infrahilar region concerning for pneumonia. Possible small layering right effusion.  CXR 5/3 mild pulm edema w/ atx vs infiltrate/aspiration  On cefepime 4/30>>5/2 for pneumonia  Augmentin 5/2>>5/4  Unasyn 5/5>>5.6 lasix  Trach placed 5/8 per CCM  Fever and elevated white count and tachycardia hence restarted on antibiotics on 07/14/2019-> Ancef started 07/17/19 for pneumonia  WOC consulted for trach wound  On trach collar intermittently  Cerebral edema Hypernatremia   MRI - Restricted diffusion throughout the majority of the left MCA and ACA vascular territories consistent with acute ischemia.  CT Head 07/04/19 - Slightly increased rightward midline shift with a small amount of herniation beneath the anterior falx. Otherwise unchanged examination.  S/p decompressive left hemicraniectomy  4/28 Dr Christella Noa  CT 5/6 - stable  infarcts and SAH but increasing edema w/ midline shift now 61mm and herniation through crani site  On keppra. Plan 2 wk course per NSG  Off 3%. On D5NS @ 82 - off  Okay to allow Na gradually normalize  Na lowering 150->149->146  Free water 200 q8h + TF  Crani sutures out   Drainage from crani site  Large amount of CSF drainage from left crani site  NSG Dr. Ronnald Ramp made aware  Continue ancef for now.  Sepsis  Fever w/ leukocytosis HCAP  Afebrile->99.4->99  Leukocytosis - 11.0->9.7->12.1->12.9   UA neg  Sputum moraxella, H. flu   On cefepime 4/30>>5/2; Augmentin 5/2>>5/4, Unasyn 5/5>>5/6 -> off  Blood culture 5/2 no growth  Blood cultures 5/7 - no growth   Resp cultures 5/7 - no growth  CXR 5/9 - streaky BLL opacilites  atx/consolication/PNA. Possible R pleural effusion  CXR 5/10 w/ atx vs infiltrate  CXR 07/15/19 - Stable bibasilar atelectasis a small right pleural effusion. No new opacity evident. Stable cardiac silhouette  Sputum cx 5/11 reincubated  Blood Cx 5/11 no growth   Vanc 5/11>>, Cefepime 5/11>>Off  Ancef started 07/17/19 for pneumonia  Bilateral Lower Ext DVT, extensive Possible PE given tachycardia  Venous US 07/11/2019 DVT + bilateral extensive  IVC filter placed 5/9  On aspirin 81 now; consider AC in 2 mos   Hypertension  Home meds:  None  Current meds: none scheduled  BP stable  SBP goal < 160 due to hemorrhagic conversion . Long-term BP goal normotensive  Dysphagia   NPO  Coretrak 5/7  PEG placement 5/14  On TF @ 50  Ileus, resolved  abd distension w/ decreased BS  KUB 5/2 distended colonic loop  Miralax   Flexiseal following laxatives  On reglan, laxatives on hold, decrease fentanyl  Coretrak 5/7  PEG 5/14  TF resumed after PEG  Acute Renal Failure    Renal US - increased cortical echogenicity in B kidneys c/w renal dz. Increased echogenicity of liver ?hepatic steatosis.  Urine studies - elevated  protein creatinine ratio, Na normal, UN normal  Creatinine - 1.20->1.06->0.93  Diuresis - per CCM  Tobacco abuse  Current smoker  Smoking cessation counseling will be provided if able  Other Stroke Risk Factors  Obesity, Body mass index is 30.71 kg/m., recommend weight loss, diet and exercise as appropriate    Likely undiagnosed obstructive sleep apnea based on body habitus  Other Active Problems  Code Status - Full code  Hypokalemia - resolved   Thrombocytopenia - 132->133->136  Elevated LFT - AST/ALT 85/156 -> 54/102, improved  Mild Anemia - Hb - 12.0->11.4  Hospital day # 18  Patient continues to be critically ill for the last 24 hours, has developed large CSF leakage from left crani site, plan to continue ancef and I discussed with Dr. Yetta Barre and he will take a look.  This patient is critically ill due to respiratory failure s/p trach still with tachypnea and copious secretions, large left MCA ACA infarct with cerebral edema s/p hemicrani, extensive DVT bilaterally s/p IVC filter, illeus, fever and leukocytosis with pneumonia and at significant risk of neurological worsening, death form recurrent stroke, PE, sepsis, cerebral edema and brain herniation, illeus, seizure. This patient's care requires constant monitoring of vital signs, hemodynamics, respiratory and cardiac monitoring, review of multiple databases, neurological assessment, discussion with family, other specialists and medical decision making of high complexity. I spent 35 minutes of neurocritical care time in the care of this patient.   Marvel Plan, MD PhD Stroke Neurology 07/19/2019 10:48 AM   To contact Stroke Continuity provider, please refer to [email or web address]. After hours, contact General Neurology

## 2019-07-19 NOTE — Progress Notes (Signed)
Dr. Roda Shutters notified this nurse that there was drainage on the patient's pillow. Upon entering the room the pillow was saturated with clear/yellow fluid. Neurosurgery paged.  Dr. Yetta Barre and Leo Grosser, NP came to bedside. 2 staples applied and told they would return to reassess.  Patient still awake with no neurological changes. Placed pad under head to monitor for any further drainage.

## 2019-07-19 NOTE — Progress Notes (Signed)
NAME:  ARLEIGH ODOWD, MRN:  [id number], DOB:  17-Jul-1970, LOS: 18 ADMISSION DATE:  07/01/2019, CONSULTATION DATE:  07/01/19 REFERRING MD:  Aroor MD, CHIEF COMPLAINT:  CVA    Brief History   Nikoli Nasser is a 49 yo M who presented as a code stroke after being found aphasic and nonverbal by his wife. CT/CTA studies showed left ICA/MCA thrombosis and pt was taken for IR thrombectomy for left ICA and left M1 occlusions. Repeat head CT was obtained following the procedure out of concern for possible hemorrhage and showed moderate basal ganglia hemorrhage vs stroke. Repeat MRI revealed that the ICA and MCA re-occluded with change in FLAIR sequence excluding him from repeat thrombectomy. He underwent a L hemicraniectomy. He was intubated for airway protection.  Past Medical History  Psoriasis  COPD  Tobacco Use  Significant Hospital Events   4/28: Admit; L ICA/MCA Thrombectomy with re-occlusion --> left hemicraniectomy, intubated for acute hypoxic respiratory failure  5/3: NAE. Was taken off precedex yesterday and pt opened his eyes. Prednisone 50 mg qd was begun. He had a persistent fever around 38.2, HR 110s, BP 140s/60s. Pt required increased O2 with secretions; CXR showed mild edema with small effusions c/f aspiration vs opacification. Blood culture x2 and urine culture collected, NGTD. ABG pending. Currently satting 92% on PRVC FiO2 80, PEEP 10, Plateau 22. WBC declined to 10.5 from 11.1, Na 151 from 156. Tube feeds were held d/t distended colonic loop in RLQ on KUB, to be continued today. Thrombocytopenia improved to 93 from 81.  5/4: Patient remains intubated with spontaneous movement of left arm and leg.  5/5: NAE. Pt remains intubated on PRN fetanyl. Will open eyes and spontaneously move left leg and arm.  5/6: NAE overnight. Intubated with no sedation. 5/7: NAE. Marland Kitchen Completed Unasyn yesterday. VSS, spO2 remains in low 90s the majority of the time on 620/40%/+5. Morning labs significant  for HCO3 of 19. Otherwise unremarkable.  5/8: Ongoing fever.  Obtained ultrasound, positive for lower extremity DVT bilaterally.  Tracheostomy placed at bedside.,  Did have some SVT this responded to treating fever and rate control. 5/9: IVC filter placed 5/11: no acute events overnight. Pt with similar exam as previous however tachycardic and febrile (tmax102.7) with a bit increased wbc and new R infiltrate noted in base. Pan cx and start empiric abx. 5/12 Tolerated 8 hours of trach collar   Consults:  IR PCCM  Neuro (Primary) Neurosurgery  Procedures:  4/28 IR - L ICA/MCA Thrombectomy 4/28 L hemicraniectomy 4/28 Intubation 5/08 Trach   Significant Diagnostic Tests:  4/28 Admit CT Head Code Stroke:  Hyperdense left ICA and MCA compatible with acute thrombus. No acute infarct or hemorrhage.  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 The patient has minimal atherosclerotic disease. The left internal carotid artery is occluded through the terminus and extending in the left M1 M2 and M3 branches. There is poor collateral circulation on the left. There is a large territory infarct involving the left hemisphere. Infarct volume 266 mL.  4/28 Admit MR Brain:  Restricted diffusion throughout much of the left MCA vascular territory consistent with acute ischemia. Restricted diffusion consistent with acute ischemia is also present within the paramedian left frontoparietal lobes, although this is less well assessed due to the degree of motion degradation at the level of the vertex. There is little if any corresponding T2/FLAIR hyperintensity at these sites.  No significant mass effect. No midline shift.  4/28 Post Thrombectomy CT Head: : 1. 3.0 x 2.6 x 3.9 cm region of hyperdensity centered within the left basal ganglia, left subinsular region and inferomedial left temporal lobe likely reflecting a  combination of parenchymal hematoma and contrast staining.  2. Edema with loss of gray-white differentiation within the left basal ganglia, left insula and anterior left temporal lobe, likely acute infarction. Subtle changes of acute infarction are also suspected within the paramedian left frontal lobe ACA vascular territory. 3. Scattered small-volume subarachnoid hemorrhage along the left cerebral hemisphere. 4. Regional mass effect with effacement of the left lateral ventricle temporal horn. No midline shift.  4/28 Post Thrombectomy MR Brain:  1. Restricted diffusion throughout the majority of the left MCA and ACA vascular territories consistent with acute ischemia. 2. No significant mass effect at this time. No midline shift. 3. The acute parenchymal hemorrhage and/or contrast staining centered within left basal ganglia and inferomedial left temporal lobe on prior head CT does not appear significantly changed. Based on the MR appearance, it is suspected that a significant component of this previously demonstrated hyperdensity reflects contrast staining. 4. Redemonstrated small volume subarachnoid hemorrhage overlying the left cerebral hemisphere. Small volume subarachnoid hemorrhage is also questioned along the right cerebral hemisphere posteriorly.  4/29 Post Hemicraniectomy CT head:  Complete left ACA/MCA territory infarction with swollen brain bulging through craniectomy defect. No midline shift or entrapment. Petechial hemorrhage at left basal ganglia. Extraaxial hemorrhage along surface of the infarct.   5/1 Head CT > Slightly increased rightward midline shift with a small amount of herniation beneath the anterior falx. Otherwise unchanged Examination.  5/6 Head CT: Left ACA and MCA territory infarct with unchanged degree of subarachnoid hemorrhage. Swelling has progressed with greater herniation through the craniectomy defect and 5 mm of midline shift.  5/8: Bilateral acute DVT  involving the right common femoral vein right proximal profundal vein and right popliteal vein also left DVT involving same vessels  5/12 Renal US: Technically challenging exam due to patient's intubated status and difficulty with repositioning.  Diffusely increased cortical echogenicity in both kidneys compatible with medical renal disease.  Increased echogenicity of the liver with diminished through transmission, most often reflective of hepatic steatosis.  Micro Data:  4/28: SARS-CoV-2/Influenza A/Influenza B PCR: Negative  4/30: Respiratory culture: Abundant Moraxella Catarrhalis, Haemophilus Influenzae, beta lactamase positive  5/2: Urine culture: NGTD  5/2: BC x2: NGTD 5/7 resp >> H flu 5/7 bld >> negative 5/11 BCx2 >> negative 5/11 trach asp >> E coli  Antimicrobials:  4/30 - 5/2: Cefepime 5/2 - 5/4: Augmentin  5/5 - 5/6 unasyn   Vanc 5/11 >> 5/13 Cefepime 5/11 >> 5/13 Ancef 5/14 >>   Interim history/subjective:  WOB better, but increased O2 needs  Objective   Blood pressure 111/72, pulse 87, temperature 98.5 F (36.9 C), temperature source Axillary, resp. rate (!) 28, height 6' (1.829 m), weight 102.7 kg, SpO2 92 %.    FiO2 (%):  [40 %-60 %] 60 %   Intake/Output Summary (Last 24 hours) at 07/19/2019 0939 Last data filed at 07/19/2019 0800 Gross per 24 hour  Intake 1795.4 ml  Output 500 ml  Net 1295.4 ml   Filed Weights   07/17/19 0338 07/18/19 0500 07/19/19 0500  Weight: 101.1 kg 102.9 kg 102.7 kg    Examination:  General - somnolent Eyes - pupils reactive ENT - trach site clean Cardiac - regular rate/rhythm, no murmur Chest - equal breath sounds b/l,  no wheezing or rales Abdomen - soft, non tender, + bowel sounds Extremities - 1+ edema Skin - crusted lesions on arms and knees Neuro - opens eyes with stimulation   Resolved Hospital Problem list   treated HCAP with Moraxella and Haemophilus influenza, these were beta-lactamase positive, antibiotics were  completed 5/6; COPD exacerbation. Ileus, AKI, hypernatremia  Assessment & Plan:   Acute hypoxic respiratory failure with compromised airway. Failure to wean s/p tracheostomy. Hx of smoking with presumed COPD. - has been on trach collar since 5/14 - goal SpO2 > 92% - trach care - continue duoneb - lasix 40 mg IV x one 5/16  E coli HCAP. - day 6/7 of Abx, currently on ancef  Large Lt ICA/MCA CVA s/p thrombectomy and decompressive hemicraniectomy 2nd to cerebral edema and hemorrhage. - per neurology and neurosurgery  B/l lower extremity DVT. - s/p IVC filter 5/09  SVT. - continue lopressor   Best practice:  Diet: Tube Feeds  DVT prophylaxis:  IVC filter 5/9 GI prophylaxis: Protonix Mobility: PT/ OT Code Status: FULL Disposition: okay to transfer to progressive care from PCCM standpoint >> defer to neurology  Labs:   CMP Latest Ref Rng & Units 07/19/2019 07/18/2019 07/17/2019  Glucose 70 - 99 mg/dL 353(G) 992(E) 268(T)  BUN 6 - 20 mg/dL 41(D) 62(I) 29(N)  Creatinine 0.61 - 1.24 mg/dL 9.89 2.11 9.41  Sodium 135 - 145 mmol/L 146(H) 149(H) 150(H)  Potassium 3.5 - 5.1 mmol/L 3.7 4.2 3.8  Chloride 98 - 111 mmol/L 114(H) 115(H) 118(H)  CO2 22 - 32 mmol/L 25 25 25   Calcium 8.9 - 10.3 mg/dL 9.3 9.5 9.5  Total Protein 6.5 - 8.1 g/dL 6.0(L) - 6.1(L)  Total Bilirubin 0.3 - 1.2 mg/dL 0.5 - 0.7  Alkaline Phos 38 - 126 U/L 75 - 62  AST 15 - 41 U/L 54(H) - 85(H)  ALT 0 - 44 U/L 102(H) - 156(H)    CBC Latest Ref Rng & Units 07/19/2019 07/18/2019 07/17/2019  WBC 4.0 - 10.5 K/uL 12.9(H) 12.1(H) 9.7  Hemoglobin 13.0 - 17.0 g/dL 11.4(L) 12.0(L) 11.8(L)  Hematocrit 39.0 - 52.0 % 37.7(L) 39.6 39.0  Platelets 150 - 400 K/uL 136(L) 133(L) 132(L)    ABG    Component Value Date/Time   PHART 7.420 07/15/2019 0544   PCO2ART 37.8 07/15/2019 0544   PO2ART 102 07/15/2019 0544   HCO3 24.4 07/15/2019 0544   TCO2 26 07/15/2019 0544   ACIDBASEDEF 3.0 (H) 07/02/2019 0806   O2SAT 98.0 07/15/2019  0544    CBG (last 3)  Recent Labs    07/18/19 2320 07/19/19 0318 07/19/19 0826  GLUCAP 92 108* 104*    Signature:  07/21/19, MD [facility] Pulmonary/Critical Care Pager - [phone] 07/19/2019, 9:39 AM

## 2019-07-20 DIAGNOSIS — I82403 Acute embolism and thrombosis of unspecified deep veins of lower extremity, bilateral: Secondary | ICD-10-CM | POA: Diagnosis not present

## 2019-07-20 DIAGNOSIS — J189 Pneumonia, unspecified organism: Secondary | ICD-10-CM | POA: Insufficient documentation

## 2019-07-20 DIAGNOSIS — E876 Hypokalemia: Secondary | ICD-10-CM | POA: Insufficient documentation

## 2019-07-20 LAB — GLUCOSE, CAPILLARY
Glucose-Capillary: 116 mg/dL — ABNORMAL HIGH (ref 70–99)
Glucose-Capillary: 118 mg/dL — ABNORMAL HIGH (ref 70–99)
Glucose-Capillary: 106 mg/dL — ABNORMAL HIGH (ref 70–99)
Glucose-Capillary: 118 mg/dL — ABNORMAL HIGH (ref 70–99)
Glucose-Capillary: 127 mg/dL — ABNORMAL HIGH (ref 70–99)
Glucose-Capillary: 122 mg/dL — ABNORMAL HIGH (ref 70–99)

## 2019-07-20 LAB — BASIC METABOLIC PANEL
Anion gap: 8 (ref 5–15)
BUN: 29 mg/dL — ABNORMAL HIGH (ref 6–20)
CO2: 26 mmol/L (ref 22–32)
Calcium: 9.4 mg/dL (ref 8.9–10.3)
Chloride: 112 mmol/L — ABNORMAL HIGH (ref 98–111)
Creatinine, Ser: 0.94 mg/dL (ref 0.61–1.24)
GFR calc Af Amer: >60 mL/min (ref >60)
GFR calc non Af Amer: >60 mL/min (ref >60)
Glucose, Bld: 122 mg/dL — ABNORMAL HIGH (ref 70–99)
Potassium: 4.1 mmol/L (ref 3.5–5.1)
Sodium: 146 mmol/L — ABNORMAL HIGH (ref 135–145)

## 2019-07-20 LAB — CBC
HCT: 40.2 % (ref 39.0–52.0)
Hemoglobin: 12.6 g/dL — ABNORMAL LOW (ref 13.0–17.0)
MCH: 29.6 pg (ref 26.0–34.0)
MCHC: 31.3 g/dL (ref 30.0–36.0)
MCV: 94.4 fL (ref 80.0–100.0)
Platelets: UNDETERMINED 10*3/uL (ref 150–400)
RBC: 4.26 MIL/uL (ref 4.22–5.81)
RDW: 15.2 % (ref 11.5–15.5)
WBC: 12.8 10*3/uL — ABNORMAL HIGH (ref 4.0–10.5)
nRBC: 0.2 % (ref 0.0–0.2)

## 2019-07-20 MED ORDER — LIDOCAINE HCL (PF) 1 % IJ SOLN
INTRAMUSCULAR | Status: AC
  Administered 2019-07-20: 5 mL
  Filled 2019-07-20: qty 5

## 2019-07-20 MED ORDER — HEPARIN SODIUM (PORCINE) 5000 UNIT/ML IJ SOLN
5000.0000 [IU] | Freq: Three times a day (TID) | INTRAMUSCULAR | Status: DC
  Administered 2019-07-20 – 2019-07-23 (×10): 5000 [IU] via SUBCUTANEOUS
  Filled 2019-07-20 (×10): qty 1

## 2019-07-20 MED ORDER — OSMOLITE 1.5 CAL PO LIQD
1000.0000 mL | ORAL | Status: DC
  Administered 2019-07-21 – 2019-07-26 (×6): 1000 mL
  Filled 2019-07-20 (×7): qty 1000

## 2019-07-20 MED ORDER — FREE WATER
200.0000 mL | Status: DC
  Administered 2019-07-20 – 2019-07-21 (×6): 200 mL

## 2019-07-20 MED ORDER — FUROSEMIDE 10 MG/ML IJ SOLN
40.0000 mg | Freq: Four times a day (QID) | INTRAMUSCULAR | Status: AC
  Administered 2019-07-20 (×2): 40 mg via INTRAVENOUS
  Filled 2019-07-20 (×2): qty 4

## 2019-07-20 MED ORDER — PRO-STAT SUGAR FREE PO LIQD
60.0000 mL | Freq: Two times a day (BID) | ORAL | Status: DC
  Administered 2019-07-20 – 2019-07-26 (×13): 60 mL
  Filled 2019-07-20 (×16): qty 60

## 2019-07-20 NOTE — Progress Notes (Signed)
Patient ID: Lee Hooper, male   DOB: 1971/01/28, 49 y.o.   MRN: [id number] BP 111/76 (BP Location: Left Arm)   Pulse 98   Temp 98.6 F (37 C) (Axillary)   Resp (!) 30   Ht 6' (1.829 m)   Wt 102.7 kg   SpO2 96%   BMI 30.71 kg/m  Alert, opening both eyes perrl Purposeful in left ue Wound with serosanguineous drainage, I sutured the incision where it appeared the wound was leaking. No neuro change.

## 2019-07-20 NOTE — Progress Notes (Addendum)
Spoke with Dr. Franky Macho regarding moderate to excessive drainage from crainiectomy site.  Was told that he would take care of it.  MD gave orders to give prn Fentanyl IV to patient.  MD then sutured area of head that was leaking.

## 2019-07-20 NOTE — Progress Notes (Signed)
STROKE TEAM PROGRESS NOTE   INTERVAL HISTORY RN at bedside. Pt neuro stable. Still has copious secretion from trach collar but suctioning more efficient with new trach collar device. Had scalp incision staples yesterday for CSF leakage. Currently wound dry and clean.    Vitals:   07/20/19 0800 07/20/19 0845 07/20/19 0900 07/20/19 1000  BP: 106/79 106/79 121/80 110/73  Pulse: 95 99 95 92  Resp: (!) 31 (!) 25 (!) 31 (!) 28  Temp: 98.3 F (36.8 C)     TempSrc: Axillary     SpO2: 96%  96% 94%  Weight:      Height:       CBC:  Recent Labs  Lab 07/19/19 0504 07/20/19 0252  WBC 12.9* 12.8*  HGB 11.4* 12.6*  HCT 37.7* 40.2  MCV 95.9 94.4  PLT 136* PLATELET CLUMPS NOTED ON SMEAR, UNABLE TO ESTIMATE   Basic Metabolic Panel:  Recent Labs  Lab 07/16/19 0658 07/16/19 0658 07/17/19 0528 07/18/19 0254 07/19/19 0504 07/20/19 0252  NA 154*   < > 150*   < > 146* 146*  K 4.4   < > 3.8   < > 3.7 4.1  CL 121*   < > 118*   < > 114* 112*  CO2 24   < > 25   < > 25 26  GLUCOSE 121*   < > 116*   < > 126* 122*  BUN 48*   < > 35*   < > 28* 29*  CREATININE 1.20   < > 1.06   < > 0.93 0.94  CALCIUM 9.4   < > 9.5   < > 9.3 9.4  MG 2.7*  --  2.5*  --   --   --   PHOS 2.9  --  3.2  --   --   --    < > = values in this interval not displayed.    IMAGING past 24 hours No results found.  PHYSICAL EXAM   General - mildly obese middle-aged Caucasian male, s/p tracheostomy on trach collar  Ophthalmologic - fundi not visualized due to noncooperation.  Cardiovascular - Regular rate and rhythm.  Neuro - s/p tracheostomy on trach collar.  Off sedation, eyes closed but easily open with voice. Did not following any commands today.   left gaze preference position, barely cross midline, not blinking to visual threat on the left, doll's eyes present, pupil left 74mm, right 4.69mm, both reactive to light. Corneal reflex weak on the right and brisk on the left, gag and cough present. Breathing over the  vent.  right facial droop.  Tongue protrusion not cooperative. Spontaneous and purposeful movement left UE 4/5 and at least LE 2/5. However, right UE and LE plegic. DTR 1+ and no babinski. Sensation, coordination and gait not tested.    ASSESSMENT/PLAN Lee Hooper is a 49 y.o. male with history of COPD, ongoing tobacco use, and psoriasis, presenting with dense LMCA syndrome. Attempt for Left ICA and Left M1 thrombectomy was done emergently with TICI 2b flow. Unfortunately, the pt had post procedure hemorrhage and reocclusion of the vessels. F/u imaging suggested early edema and pt underwent prophylactic decompressive hemicraniectomy.  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  Stroke etiology unknown - given DVT requiring eventual AC and high mRS, will not do further embolic workup  Code Stroke CT head showed early hypodensity in nearly entire LMCA. ASPECTS 6-7 per notes.  CTA head & neck occlusion cervical ICA on the left. This is most likely due to dissection. The patient has minimal atherosclerotic disease.  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  There is poor collateral circulation on the left.   CTP CBF large core volume of 266 mL, perfusion volume 313 mL, mismatch volume 47 mL.  ASPECTS using CTP by Dr. Chestine Spore 6 or 7.   IR - TICI2c revascularization  MRI  Restricted diffusion throughout the majority of the left MCA and ACA vascular territories consistent with acute ischemia.  MRA - reocclusion of the intracranial left internal carotid artery. No flow related signal is seen within the left middle cerebral artery.  CT Head 07/04/19 - Slightly increased  rightward midline shift with a small amount of herniation beneath the anterior falx. Otherwise unchanged examination.  CT repeat 5/6 stable infarcts and SAH but increasing edema w/ midline shift now 64mm and herniation through crani site  2D Echo - EF 55 - 60%. No cardiac source of emboli identified.   LDL 45  HgbA1c 5.5  Subq heparin for VTE prophylaxis  No antithrombotic prior to admission, no antithrombotics d/t hemorrhagic conversion, now on aspirin 81. Consider AC in 2 mos  Therapy recommendations:  SNF  Disposition:  Pending - LTACH consulted however no insurance  Respiratory failure s/p Trach Trach wound  Intubated on vent  CCM on board  CXR 07/03/19- New consolidation in the right lower lobe and left infrahilar region concerning for pneumonia. Possible small layering right effusion.  CXR 5/3 mild pulm edema w/ atx vs infiltrate/aspiration  On cefepime 4/30>>5/2 for pneumonia  Augmentin 5/2>>5/4  Unasyn 5/5>>5/6  Trach placed 5/8 per CCM  WOC consulted for trach wound ->progressed to stage 3 pressure, for dressing change q3d and prn  Fever and elevated white count and tachycardia hence restarted on Vanc & Cefepime 5/11>>5/13  Ancef 5/14>>5/17  (pneumonia)  On trach collar since 5/14 w/ high FiO2 requirements  Cerebral edema Hypernatremia   MRI - Restricted diffusion throughout the majority of the left MCA and ACA vascular territories consistent with acute ischemia.  CT Head 07/04/19 - Slightly increased rightward midline shift with a small amount of herniation beneath the anterior falx. Otherwise unchanged examination.  S/p decompressive left hemicraniectomy  4/28 Dr Franky Macho  CT 5/6 - stable infarcts and SAH but increasing edema w/ midline shift now 7mm and herniation through crani site  On keppra. Plan 2 wk course per NSG  Off 3%. On D5NS @ 75 - off  Okay to allow Na gradually normalize  Na lowering 150->149->146->146  Increase Free water 200  To  q4h + TF    Crani sutures out   Drainage from crani site  Large amount of CSF drainage from left crani site 5/16  NSG Dr. Yetta Barre made 2 staples at incision site  Wound dry and clean 5/17  Continue to monitor  Sepsis  Fever w/ leukocytosis HCAP  Afebrile->99.4->99->afebrile  Leukocytosis - 11.0->9.7->12.1->12.9->12.8  UA neg  Sputum moraxella, H. flu   On cefepime 4/30>>5/2; Augmentin 5/2>>5/4, Unasyn 5/5>>5/6 -> off  Blood culture 5/2 no growth  Blood cultures 5/7 - no growth   Resp cultures 5/7 - no growth  CXR  5/9 - streaky BLL opacilites atx/consolication/PNA. Possible R pleural effusion  CXR 5/10 w/ atx vs infiltrate  CXR 07/15/19 - Stable bibasilar atelectasis a small right pleural effusion. No new opacity evident. Stable cardiac silhouette  Sputum cx 5/11 reincubated   Blood Cx 5/11 no growth   Vanc 5/11>>5/13, Cefepime 5/11>>5/13  Ancef 5/14>>5/17  (pneumonia)  CXR pending in am  Bilateral Lower Ext DVT, extensive Possible PE given tachycardia  Venous US 07/11/2019 DVT + bilateral extensive  IVC filter placed 5/9  On heparin subq for DVT prophylaxis  On aspirin 81 now; consider AC in 2 mos   Hypertension  Home meds:  None  Current meds: none scheduled  BP stable  SBP goal < 160 due to hemorrhagic conversion . Long-term BP goal normotensive  Dysphagia   NPO  Coretrak 5/7  PEG placement 5/14  On TF @ 50  Ileus, resolved  abd distension w/ decreased BS  KUB 5/2 distended colonic loop  Miralax   Flexiseal following laxatives  On reglan, laxatives on hold, decrease fentanyl  Coretrak 5/7  PEG 5/14  TF resumed after PEG  Acute Renal Failure    Renal US - increased cortical echogenicity in B kidneys c/w renal dz. Increased echogenicity of liver ?hepatic steatosis.  Urine studies - elevated protein creatinine ratio, Na normal, UN normal  Creatinine - 1.20->1.06->0.93->0.94  Diuresis - per CCM  Tobacco  abuse  Current smoker  Smoking cessation counseling will be provided if able  Other Stroke Risk Factors  Obesity, Body mass index is 30.71 kg/m., recommend weight loss, diet and exercise as appropriate    Likely undiagnosed obstructive sleep apnea based on body habitus  Other Active Problems  Code Status - Full code  Hypokalemia - resolved   Thrombocytopenia - 132->133->136->clumped   Elevated LFT - AST/ALT 85/156 -> 54/102, improved     Hospital day # 19  Patient continues to be critically ill for the last 24 hours, has continued copious secretion and needed frequent suctioning, pending CXR in am and continue ancef final day. I have discussed with CCM NP Pete.  This patient is critically ill due to respiratory failure s/p trach still with tachypnea and copious secretions, large left MCA ACA infarct with cerebral edema s/p hemicrani, extensive DVT bilaterally s/p IVC filter, illeus, fever and leukocytosis with pneumonia and at significant risk of neurological worsening, death form recurrent stroke, PE, sepsis, cerebral edema and brain herniation, illeus, seizure. This patient's care requires constant monitoring of vital signs, hemodynamics, respiratory and cardiac monitoring, review of multiple databases, neurological assessment, discussion with family, other specialists and medical decision making of high complexity. I spent 30 minutes of neurocritical care time in the care of this patient.   Marvel Plan, MD PhD Stroke Neurology 07/20/2019 10:41 AM   To contact Stroke Continuity provider, please refer to [email or web address]. After hours, contact General Neurology

## 2019-07-20 NOTE — Progress Notes (Signed)
NAME:  Lee Hooper, MRN:  [id number], DOB:  11/27/1970, LOS: 19 ADMISSION DATE:  07/01/2019, CONSULTATION DATE:  07/01/19 REFERRING MD:  Aroor MD, CHIEF COMPLAINT:  CVA    Brief History   Lee Hooper is a 49 yo M who presented as a code stroke after being found aphasic and nonverbal by his wife. CT/CTA studies showed left ICA/MCA thrombosis and pt was taken for IR thrombectomy for left ICA and left M1 occlusions. Repeat head CT was obtained following the procedure out of concern for possible hemorrhage and showed moderate basal ganglia hemorrhage vs stroke. Repeat MRI revealed that the ICA and MCA re-occluded with change in FLAIR sequence excluding him from repeat thrombectomy. He underwent a L hemicraniectomy. He was intubated for airway protection.  Past Medical History  Psoriasis  COPD  Tobacco Use  Significant Hospital Events   4/28: Admit; L ICA/MCA Thrombectomy with re-occlusion --> left hemicraniectomy, intubated for acute hypoxic respiratory failure  5/3: NAE. Was taken off precedex yesterday and pt opened his eyes. Prednisone 50 mg qd was begun. He had a persistent fever around 38.2, HR 110s, BP 140s/60s. Pt required increased O2 with secretions; CXR showed mild edema with small effusions c/f aspiration vs opacification. Blood culture x2 and urine culture collected, NGTD. ABG pending. Currently satting 92% on PRVC FiO2 80, PEEP 10, Plateau 22. WBC declined to 10.5 from 11.1, Na 151 from 156. Tube feeds were held d/t distended colonic loop in RLQ on KUB, to be continued today. Thrombocytopenia improved to 93 from 81.  5/4: Patient remains intubated with spontaneous movement of left arm and leg.  5/5: NAE. Pt remains intubated on PRN fetanyl. Will open eyes and spontaneously move left leg and arm.  5/6: NAE overnight. Intubated with no sedation. 5/7: NAE. Marland Kitchen Completed Unasyn yesterday. VSS, spO2 remains in low 90s the majority of the time on 620/40%/+5. Morning labs significant  for HCO3 of 19. Otherwise unremarkable.  5/8: Ongoing fever.  Obtained ultrasound, positive for lower extremity DVT bilaterally.  Tracheostomy placed at bedside.,  Did have some SVT this responded to treating fever and rate control. 5/9: IVC filter placed 5/11: no acute events overnight. Pt with similar exam as previous however tachycardic and febrile (tmax102.7) with a bit increased wbc and new R infiltrate noted in base. Pan cx and start empiric abx. 5/12 Tolerated 8 hours of trach collar   Consults:  IR PCCM  Neuro (Primary) Neurosurgery  Procedures:  4/28 IR - L ICA/MCA Thrombectomy 4/28 L hemicraniectomy 4/28 Intubation 5/08 Trach   Significant Diagnostic Tests:  4/28 Admit CT Head Code Stroke:  Hyperdense left ICA and MCA compatible with acute thrombus. No acute infarct or hemorrhage.  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 The patient has minimal atherosclerotic disease. The left internal carotid artery is occluded through the terminus and extending in the left M1 M2 and M3 branches. There is poor collateral circulation on the left. There is a large territory infarct involving the left hemisphere. Infarct volume 266 mL.  4/28 Admit MR Brain:  Restricted diffusion throughout much of the left MCA vascular territory consistent with acute ischemia. Restricted diffusion consistent with acute ischemia is also present within the paramedian left frontoparietal lobes, although this is less well assessed due to the degree of motion degradation at the level of the vertex. There is little if any corresponding T2/FLAIR hyperintensity at these sites.  No significant mass effect. No midline shift.  4/28 Post Thrombectomy CT Head: : 1. 3.0 x 2.6 x 3.9 cm region of hyperdensity centered within the left basal ganglia, left subinsular region and inferomedial left temporal lobe likely reflecting a  combination of parenchymal hematoma and contrast staining.  2. Edema with loss of gray-white differentiation within the left basal ganglia, left insula and anterior left temporal lobe, likely acute infarction. Subtle changes of acute infarction are also suspected within the paramedian left frontal lobe ACA vascular territory. 3. Scattered small-volume subarachnoid hemorrhage along the left cerebral hemisphere. 4. Regional mass effect with effacement of the left lateral ventricle temporal horn. No midline shift.  4/28 Post Thrombectomy MR Brain:  1. Restricted diffusion throughout the majority of the left MCA and ACA vascular territories consistent with acute ischemia. 2. No significant mass effect at this time. No midline shift. 3. The acute parenchymal hemorrhage and/or contrast staining centered within left basal ganglia and inferomedial left temporal lobe on prior head CT does not appear significantly changed. Based on the MR appearance, it is suspected that a significant component of this previously demonstrated hyperdensity reflects contrast staining. 4. Redemonstrated small volume subarachnoid hemorrhage overlying the left cerebral hemisphere. Small volume subarachnoid hemorrhage is also questioned along the right cerebral hemisphere posteriorly.  4/29 Post Hemicraniectomy CT head:  Complete left ACA/MCA territory infarction with swollen brain bulging through craniectomy defect. No midline shift or entrapment. Petechial hemorrhage at left basal ganglia. Extraaxial hemorrhage along surface of the infarct.   5/1 Head CT > Slightly increased rightward midline shift with a small amount of herniation beneath the anterior falx. Otherwise unchanged Examination.  5/6 Head CT: Left ACA and MCA territory infarct with unchanged degree of subarachnoid hemorrhage. Swelling has progressed with greater herniation through the craniectomy defect and 5 mm of midline shift.  5/8: Bilateral acute DVT  involving the right common femoral vein right proximal profundal vein and right popliteal vein also left DVT involving same vessels  5/12 Renal US: Technically challenging exam due to patient's intubated status and difficulty with repositioning.  Diffusely increased cortical echogenicity in both kidneys compatible with medical renal disease.  Increased echogenicity of the liver with diminished through transmission, most often reflective of hepatic steatosis.  Micro Data:  4/28: SARS-CoV-2/Influenza A/Influenza B PCR: Negative  4/30: Respiratory culture: Abundant Moraxella Catarrhalis, Haemophilus Influenzae, beta lactamase positive  5/2: Urine culture: NGTD  5/2: BC x2: NGTD 5/7 resp >> H flu 5/7 bld >> negative 5/11 BCx2 >> negative 5/11 trach asp >> E coli  Antimicrobials:  4/30 - 5/2: Cefepime 5/2 - 5/4: Augmentin  5/5 - 5/6 unasyn   Vanc 5/11 >> 5/13 Cefepime 5/11 >> 5/13 Ancef 5/14 >>   Interim history/subjective:  He remains on 60% FiO2  Objective   Blood pressure 121/80, pulse 95, temperature 98.3 F (36.8 C), temperature source Axillary, resp. rate (Abnormal) 31, height 6' (1.829 m), weight 102.7 kg, SpO2 96 %.    FiO2 (%):  [40 %-60 %] 60 %   Intake/Output Summary (Last 24 hours) at 07/20/2019 1014 Last data filed at 07/20/2019 0800 Gross per 24 hour  Intake 1707.12 ml  Output 1700 ml  Net 7.12 ml   Filed Weights   07/18/19 0500 07/19/19 0500 07/20/19 0500  Weight: 102.9 kg 102.7 kg 102.7 kg    Examination:  General 49 year old male currently resting in bed he is in no acute distress today HEENT normocephalic atraumatic tracheostomy tube is unremarkable currently trach is inflated  Pulmonary: Some scattered rhonchi no accessory use currently 60% aerosol T-bar via trach Cardiac regular rate and rhythm without murmur rub or gallop  abdomen is soft, no tenderness Extremities warm dry with generalized edema pulses are palpable Neuro awake, interactive.   Continues to have right-sided hemiplegia GU: Clear yellow    Resolved Hospital Problem list   treated HCAP with Moraxella and Haemophilus influenza, these were beta-lactamase positive, antibiotics were completed 5/6; COPD exacerbation. Ileus, AKI, hypernatremia  Assessment & Plan:   Acute hypoxic respiratory failure with compromised airway. Failure to wean s/p tracheostomy. Hx of smoking with presumed COPD. - has been on trach collar since 5/14; however continues to have higher FiO2 requirements Plan Continue supplemental oxygen with pulse oximetry Suction as needed Routine tracheostomy care Scheduled bronchodilators Repeat Lasix today Repeat chest x-ray in a.m.  E coli HCAP. Day #7 of Ancef, today is final day  Large Lt ICA/MCA CVA s/p thrombectomy and decompressive hemicraniectomy 2nd to cerebral edema and hemorrhage. Plan Per stroke and neurosurgical team  Mild hypernatremia Plan Add free water via tube  B/l lower extremity DVT. - s/p IVC filter 5/09 Plan Will need IR follow-up OK to resume DVT prophylactic dosing   SVT. Plan Continuing Lopressor via tube   Best practice:  Diet: Tube Feeds  DVT prophylaxis:  IVC filter 5/9 GI prophylaxis: Protonix Mobility: PT/ OT Code Status: FULL Disposition: I would keep him in the intensive care for 1 more day given his high FiO2 requirements  Labs:   CMP Latest Ref Rng & Units 07/20/2019 07/19/2019 07/18/2019  Glucose 70 - 99 mg/dL 122(H) 126(H) 119(H)  BUN 6 - 20 mg/dL 29(H) 28(H) 28(H)  Creatinine 0.61 - 1.24 mg/dL 0.94 0.93 0.93  Sodium 135 - 145 mmol/L 146(H) 146(H) 149(H)  Potassium 3.5 - 5.1 mmol/L 4.1 3.7 4.2  Chloride 98 - 111 mmol/L 112(H) 114(H) 115(H)  CO2 22 - 32 mmol/L 26 25 25   Calcium 8.9 - 10.3 mg/dL 9.4 9.3 9.5  Total Protein 6.5 - 8.1 g/dL - 6.0(L) -  Total Bilirubin 0.3 - 1.2 mg/dL - 0.5 -  Alkaline Phos 38 - 126 U/L - 75 -  AST 15 - 41 U/L - 54(H) -  ALT 0 - 44 U/L - 102(H) -    CBC Latest  Ref Rng & Units 07/20/2019 07/19/2019 07/18/2019  WBC 4.0 - 10.5 K/uL 12.8(H) 12.9(H) 12.1(H)  Hemoglobin 13.0 - 17.0 g/dL 12.6(L) 11.4(L) 12.0(L)  Hematocrit 39.0 - 52.0 % 40.2 37.7(L) 39.6  Platelets 150 - 400 K/uL PLATELET CLUMPS NOTED ON SMEAR, UNABLE TO ESTIMATE 136(L) 133(L)    ABG    Component Value Date/Time   PHART 7.420 07/15/2019 0544   PCO2ART 37.8 07/15/2019 0544   PO2ART 102 07/15/2019 0544   HCO3 24.4 07/15/2019 0544   TCO2 26 07/15/2019 0544   ACIDBASEDEF 3.0 (H) 07/02/2019 0806   O2SAT 98.0 07/15/2019 0544    CBG (last 3)  Recent Labs    07/19/19 2324 07/20/19 0329 07/20/19 0823  GLUCAP 126* 116* 118*    Signature:  Erick Colace ACNP-BC [place] Pager # [phone] OR # [phone] if no answer

## 2019-07-20 NOTE — Progress Notes (Signed)
Nutrition Follow-up  DOCUMENTATION CODES:   Obesity unspecified  INTERVENTION:   Initiate tube feeding via PEG: Osmolite 1.5 at 55 ml/h (1320 ml per day) Pro-stat 60 ml BID  Provides 2380 kcal, 142 gm protein, 1003 ml free water daily  200 ml free water every 4 hours: 1200 ml   Total free water: 2203 ml   NUTRITION DIAGNOSIS:   Inadequate oral intake related to inability to eat as evidenced by NPO status. Ongoing.   GOAL:   Patient will meet greater than or equal to 90% of their needs Meeting with TF.   MONITOR:   TF tolerance  REASON FOR ASSESSMENT:   Consult, Ventilator Enteral/tube feeding initiation and management  ASSESSMENT:   Pt with PMH of smoking and COPD admitted with L ICA/MCA s/p IR for revascularization however pt had re-occlusion now s/p emergent L hemicraniectomy.   Pt discussed during ICU rounds and with RN.  Pt now transitioned to trach collar, currently at 60%  4/30 cortrak placed; tip gastric 5/2 TF held due to ileus 5/6 trickle TF started 5/8 trach placed; IVC filter placed  5/14 PEG placed   Medications reviewed and include: MVI Labs reviewed: Na 146 (H) CBG's: [id number]  TF: Vital High Protein @ 50 ml/hr with 60 ml Prostat BID Provides: 1600 kcal and 165 grams protein   Diet Order:   Diet Order            Diet NPO time specified  Diet effective midnight              EDUCATION NEEDS:   No education needs have been identified at this time  Skin:  Skin Assessment: Skin Integrity Issues: Skin Integrity Issues:: Stage III Stage III: R neck  Last BM:  5/15  Height:   Ht Readings from Last 1 Encounters:  07/02/19 6' (1.829 m)    Weight:   Wt Readings from Last 1 Encounters:  07/20/19 102.7 kg    Ideal Body Weight:  80.9 kg  BMI:  Body mass index is 30.71 kg/m.  Estimated Nutritional Needs:   Kcal:  2300-2500  Protein:  125-145 grams  Fluid:  2 L/day  Cammy Copa., RD, LDN, CNSC See AMiON for  contact information

## 2019-07-20 NOTE — Consult Note (Addendum)
WOC Nurse wound follow up Wound type: Right neck underneath trach faceplate with previous deep tissue injury has evolved into stage 3 pressure injury Measurement: .3X1.2X.1cm Wound bed: red and moist Drainage (amount, consistency, odor) small amt tan drainage, no odor Periwound: intact skin surrounding Dressing procedure/placement/frequency: Topical treatment orders provided for bedside nurses to perform as follows to promote healing: Foam dressing underneath right trach faceplate; change Q 3 days or PRN soiling. WOC team will re-assess location weekly to determine if a change in the plan of care should be made at that time. Cammie Mcgee MSN, RN, CWOCN, [place], CNS [phone]

## 2019-07-21 ENCOUNTER — Inpatient Hospital Stay (HOSPITAL_COMMUNITY): Payer: Medicaid Other

## 2019-07-21 DIAGNOSIS — Z930 Tracheostomy status: Secondary | ICD-10-CM

## 2019-07-21 DIAGNOSIS — I10 Essential (primary) hypertension: Secondary | ICD-10-CM

## 2019-07-21 LAB — BASIC METABOLIC PANEL
Anion gap: 10 (ref 5–15)
BUN: 33 mg/dL — ABNORMAL HIGH (ref 6–20)
CO2: 30 mmol/L (ref 22–32)
Calcium: 9.4 mg/dL (ref 8.9–10.3)
Chloride: 106 mmol/L (ref 98–111)
Creatinine, Ser: 0.91 mg/dL (ref 0.61–1.24)
GFR calc Af Amer: >60 mL/min (ref >60)
GFR calc non Af Amer: >60 mL/min (ref >60)
Glucose, Bld: 120 mg/dL — ABNORMAL HIGH (ref 70–99)
Potassium: 3.8 mmol/L (ref 3.5–5.1)
Sodium: 146 mmol/L — ABNORMAL HIGH (ref 135–145)

## 2019-07-21 LAB — GLUCOSE, CAPILLARY
Glucose-Capillary: 127 mg/dL — ABNORMAL HIGH (ref 70–99)
Glucose-Capillary: 117 mg/dL — ABNORMAL HIGH (ref 70–99)
Glucose-Capillary: 138 mg/dL — ABNORMAL HIGH (ref 70–99)
Glucose-Capillary: 129 mg/dL — ABNORMAL HIGH (ref 70–99)
Glucose-Capillary: 133 mg/dL — ABNORMAL HIGH (ref 70–99)
Glucose-Capillary: 147 mg/dL — ABNORMAL HIGH (ref 70–99)

## 2019-07-21 LAB — CBC
HCT: 40.8 % (ref 39.0–52.0)
Hemoglobin: 12.5 g/dL — ABNORMAL LOW (ref 13.0–17.0)
MCH: 28.9 pg (ref 26.0–34.0)
MCHC: 30.6 g/dL (ref 30.0–36.0)
MCV: 94.2 fL (ref 80.0–100.0)
Platelets: 165 10*3/uL (ref 150–400)
RBC: 4.33 MIL/uL (ref 4.22–5.81)
RDW: 15.2 % (ref 11.5–15.5)
WBC: 13.2 10*3/uL — ABNORMAL HIGH (ref 4.0–10.5)
nRBC: 0.5 % — ABNORMAL HIGH (ref 0.0–0.2)

## 2019-07-21 MED ORDER — POTASSIUM CHLORIDE 20 MEQ/15ML (10%) PO SOLN
40.0000 meq | Freq: Once | ORAL | Status: AC
  Administered 2019-07-21: 40 meq
  Filled 2019-07-21: qty 30

## 2019-07-21 MED ORDER — FREE WATER
250.0000 mL | Status: DC
  Administered 2019-07-21 – 2019-07-25 (×25): 250 mL

## 2019-07-21 MED ORDER — FUROSEMIDE 10 MG/ML IJ SOLN
80.0000 mg | Freq: Four times a day (QID) | INTRAMUSCULAR | Status: AC
  Administered 2019-07-21 (×2): 80 mg via INTRAVENOUS
  Filled 2019-07-21 (×2): qty 8

## 2019-07-21 NOTE — Progress Notes (Signed)
Physical Therapy Treatment Patient Details Name: Lee Hooper MRN: [id number] DOB: Dec 20, 1970 Today's Date: 07/21/2019    History of Present Illness This 49 y.o. male admitted 4/28 with Rt sided hemiplegia and aphasia.  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  Repeat CT following procedure showed moderate basal ganglia hemorrhage vs. stroke   Repeat MRI revealed that the ICA and MCA re-occluded with change in FLAIR sequence excluding him from repeat thrombectomy.  He underwent a decompressive hemicraniectomy.  He waws intubated 4/28.  He developed an ileus 5/2. Trach 5/8. IVC filter placed 5/9.  Peg placement.  PMH includes: COPD, tobacco abuse, psoriasis    PT Comments    Seen today in conjunction with OT and pt able to tolerate EOB x about 15 minutes with support and work on focusing on photos of family and items for auto repair on OT's phone.  He focused on items at midline, but not past to the R and continued to attempt to push to R with L UE. Still with limited activity tolerance with HR up to 124 (better than 133 last session) and SpO2 dropping on 40% trach collar to 87% until RN suctioned and increased to 60%.  Patient working towards goals with EOB activity starting last week after weaned from vent.  Goals updated this session some downgraded due to slow progress.  Continue to feel he is appropriate for SNF level rehab at d/c.   Follow Up Recommendations  SNF     Equipment Recommendations  Wheelchair (measurements PT);Wheelchair cushion (measurements PT);Hospital bed    Recommendations for Other Services       Precautions / Restrictions Precautions Precautions: Fall Precaution Comments: R hemi, trach, coretrack; No Bone flap L head;crani    Mobility  Bed Mobility Overal bed mobility: Needs Assistance Bed Mobility: Rolling;Sidelying to Sit Rolling: Max assist;+2 for physical assistance Sidelying to sit: Total assist;+2 for physical assistance Supine to  sit: Total assist;+2 for physical assistance;+2 for safety/equipment   Sit to sidelying: Total assist;+2 for physical assistance General bed mobility comments: pt attempted to use L UE on rail but rolled to L so unable to use to aide much with transition to EOB  Transfers                 General transfer comment: unable to attempt ; total A; needs [facility]  Ambulation/Gait                 Stairs             Wheelchair Mobility    Modified Rankin (Stroke Patients Only)       Balance Overall balance assessment: Needs assistance Sitting-balance support: Feet supported;No upper extremity supported Sitting balance-Leahy Scale: Poor 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; once LUE removed from bed, pt able to maintain midline postural control with Max A to prevent pt from falling posteriorly; When support remvoed, pt with no intiation of correcting posture when leaning forward and to R                                    Cognition Arousal/Alertness: Lethargic Behavior During Therapy: Flat affect;Impulsive;Restless Overall Cognitive Status: Impaired/Different from baseline Area of Impairment: Attention;Following commands;Safety/judgement;Awareness;Problem solving                   Current Attention Level: Focused   Following Commands:  Follows one step commands inconsistently Safety/Judgement: Decreased awareness of safety;Decreased awareness of deficits Awareness: Intellectual Problem Solving: Slow processing;Decreased initiation;Difficulty sequencing;Requires verbal cues;Requires tactile cues General Comments: able to focus briefly on family photos presented as well as on automotive items on webside on OT's phone      Exercises General Exercises - Upper Extremity Shoulder Flexion: PROM;Right;5 reps Shoulder ABduction: PROM;Right;5 reps Elbow Flexion: PROM;Right;5 reps Elbow Extension: PROM;Right;5 reps Wrist  Flexion: PROM;Right;5 reps Wrist Extension: PROM;Right;5 reps Digit Composite Flexion: PROM;Right;5 reps Composite Extension: PROM;Right;5 reps Other Exercises Other Exercises: PROM R LE in supine    General Comments General comments (skin integrity, edema, etc.): On trach collar 40% initially HR max 124 and SpO2 in low 90's to 88%, RN increased to 60% end of session after suctioning to increase to 90% with HR in supine 113      Pertinent Vitals/Pain Pain Assessment: Faces Faces Pain Scale: Hurts little more Pain Location: grimacing at times with movement Pain Descriptors / Indicators: Grimacing Pain Intervention(s): Monitored during session;Limited activity within patient's tolerance    Home Living                      Prior Function            PT Goals (current goals can now be found in the care plan section) Acute Rehab PT Goals Patient Stated Goal: none stated by patient PT Goal Formulation: Patient unable to participate in goal setting Time For Goal Achievement: 08/04/19 Potential to Achieve Goals: Fair Progress towards PT goals: Progressing toward goals    Frequency    Min 2X/week      PT Plan Current plan remains appropriate    Co-evaluation PT/OT/SLP Co-Evaluation/Treatment: Yes Reason for Co-Treatment: Complexity of the patient's impairments (multi-system involvement);Necessary to address cognition/behavior during functional activity;For patient/therapist safety PT goals addressed during session: Mobility/safety with mobility;Balance OT goals addressed during session: ADL's and self-care;Strengthening/ROM      AM-PAC PT "6 Clicks" Mobility   Outcome Measure  Help needed turning from your back to your side while in a flat bed without using bedrails?: Total Help needed moving from lying on your back to sitting on the side of a flat bed without using bedrails?: Total Help needed moving to and from a bed to a chair (including a wheelchair)?:  Total Help needed standing up from a chair using your arms (e.g., wheelchair or bedside chair)?: Total Help needed to walk in hospital room?: Total Help needed climbing 3-5 steps with a railing? : Total 6 Click Score: 6    End of Session Equipment Utilized During Treatment: Oxygen Activity Tolerance: Patient limited by fatigue Patient left: in bed;with call bell/phone within reach;with bed alarm set;with nursing/sitter in room   PT Visit Diagnosis: Difficulty in walking, not elsewhere classified (R26.2);Hemiplegia and hemiparesis Hemiplegia - dominant/non-dominant: Dominant Hemiplegia - caused by: Nontraumatic intracerebral hemorrhage     Time: 1240-1316 PT Time Calculation (min) (ACUTE ONLY): 36 min  Charges:  $Therapeutic Activity: 8-22 mins                     Sheran Lawless, [facility] [phone] 07/21/2019    Lee Hooper 07/21/2019, 4:39 PM

## 2019-07-21 NOTE — Progress Notes (Signed)
NAME:  Lee Hooper, MRN:  [id number], DOB:  [birth year], LOS: 20 ADMISSION DATE:  07/01/2019, CONSULTATION DATE:  07/01/19 REFERRING MD:  Aroor MD, CHIEF COMPLAINT:  CVA    Brief History   Lee Hooper is a 49 yo M who presented as a code stroke after being found aphasic and nonverbal by his wife. CT/CTA studies showed left ICA/MCA thrombosis and pt was taken for IR thrombectomy for left ICA and left M1 occlusions. Repeat head CT was obtained following the procedure out of concern for possible hemorrhage and showed moderate basal ganglia hemorrhage vs stroke. Repeat MRI revealed that the ICA and MCA re-occluded with change in FLAIR sequence excluding him from repeat thrombectomy. He underwent a L hemicraniectomy. He was intubated for airway protection.  Past Medical History  Psoriasis  COPD  Tobacco Use  Significant Hospital Events   4/28: Admit; L ICA/MCA Thrombectomy with re-occlusion --> left hemicraniectomy, intubated for acute hypoxic respiratory failure  5/3: NAE. Was taken off precedex yesterday and pt opened his eyes. Prednisone 50 mg qd was begun. He had a persistent fever around 38.2, HR 110s, BP 140s/60s. Pt required increased O2 with secretions; CXR showed mild edema with small effusions c/f aspiration vs opacification. Blood culture x2 and urine culture collected, NGTD. ABG pending. Currently satting 92% on PRVC FiO2 80, PEEP 10, Plateau 22. WBC declined to 10.5 from 11.1, Na 151 from 156. Tube feeds were held d/t distended colonic loop in RLQ on KUB, to be continued today. Thrombocytopenia improved to 93 from 81.  5/4: Patient remains intubated with spontaneous movement of left arm and leg.  5/5: NAE. Pt remains intubated on PRN fetanyl. Will open eyes and spontaneously move left leg and arm.  5/6: NAE overnight. Intubated with no sedation. 5/7: NAE. Marland Kitchen Completed Unasyn yesterday. VSS, spO2 remains in low 90s the majority of the time on 620/40%/+5. Morning labs significant  for HCO3 of 19. Otherwise unremarkable.  5/8: Ongoing fever.  Obtained ultrasound, positive for lower extremity DVT bilaterally.  Tracheostomy placed at bedside.,  Did have some SVT this responded to treating fever and rate control. 5/9: IVC filter placed 5/11: no acute events overnight. Pt with similar exam as previous however tachycardic and febrile (tmax102.7) with a bit increased wbc and new R infiltrate noted in base. Pan cx and start empiric abx. 5/12 Tolerated 8 hours of trach collar  5/17 completed antibiotics, added IV Lasix, for ongoing high supplemental oxygen requirements.  Because of this we delayed transfer out 5/18 improved oxygenation, improved x-ray.  Weaning oxygen.  Transitioning to aerosol trach collar Consults:  IR PCCM  Neuro (Primary) Neurosurgery  Procedures:  4/28 IR - L ICA/MCA Thrombectomy 4/28 L hemicraniectomy 4/28 Intubation 5/08 Trach   Significant Diagnostic Tests:  4/28 Admit CT Head Code Stroke:  Hyperdense left ICA and MCA compatible with acute thrombus. No acute infarct or hemorrhage.  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 The patient has minimal atherosclerotic disease. The left internal carotid artery is occluded through the terminus and extending in the left M1 M2 and M3 branches. There is poor collateral circulation on the left. There is a large territory infarct involving the left hemisphere. Infarct volume 266 mL.  4/28 Admit MR Brain:  Restricted diffusion throughout much of the left MCA vascular territory consistent with acute ischemia. Restricted diffusion consistent with acute ischemia is also present within the paramedian  left frontoparietal lobes, although this is less well assessed due to the degree of motion degradation at the level of the vertex. There is little if any corresponding T2/FLAIR hyperintensity at these sites. No significant  mass effect. No midline shift.  4/28 Post Thrombectomy CT Head: : 1. 3.0 x 2.6 x 3.9 cm region of hyperdensity centered within the left basal ganglia, left subinsular region and inferomedial left temporal lobe likely reflecting a combination of parenchymal hematoma and contrast staining.  2. Edema with loss of gray-white differentiation within the left basal ganglia, left insula and anterior left temporal lobe, likely acute infarction. Subtle changes of acute infarction are also suspected within the paramedian left frontal lobe ACA vascular territory. 3. Scattered small-volume subarachnoid hemorrhage along the left cerebral hemisphere. 4. Regional mass effect with effacement of the left lateral ventricle temporal horn. No midline shift.  4/28 Post Thrombectomy MR Brain:  1. Restricted diffusion throughout the majority of the left MCA and ACA vascular territories consistent with acute ischemia. 2. No significant mass effect at this time. No midline shift. 3. The acute parenchymal hemorrhage and/or contrast staining centered within left basal ganglia and inferomedial left temporal lobe on prior head CT does not appear significantly changed. Based on the MR appearance, it is suspected that a significant component of this previously demonstrated hyperdensity reflects contrast staining. 4. Redemonstrated small volume subarachnoid hemorrhage overlying the left cerebral hemisphere. Small volume subarachnoid hemorrhage is also questioned along the right cerebral hemisphere posteriorly.  4/29 Post Hemicraniectomy CT head:  Complete left ACA/MCA territory infarction with swollen brain bulging through craniectomy defect. No midline shift or entrapment. Petechial hemorrhage at left basal ganglia. Extraaxial hemorrhage along surface of the infarct.   5/1 Head CT > Slightly increased rightward midline shift with a small amount of herniation beneath the anterior falx. Otherwise  unchanged Examination.  5/6 Head CT: Left ACA and MCA territory infarct with unchanged degree of subarachnoid hemorrhage. Swelling has progressed with greater herniation through the craniectomy defect and 5 mm of midline shift.  5/8: Bilateral acute DVT involving the right common femoral vein right proximal profundal vein and right popliteal vein also left DVT involving same vessels  5/12 Renal US: Technically challenging exam due to patient's intubated status and difficulty with repositioning.  Diffusely increased cortical echogenicity in both kidneys compatible with medical renal disease.  Increased echogenicity of the liver with diminished through transmission, most often reflective of hepatic steatosis.  Micro Data:  4/28: SARS-CoV-2/Influenza A/Influenza B PCR: Negative  4/30: Respiratory culture: Abundant Moraxella Catarrhalis, Haemophilus Influenzae, beta lactamase positive  5/2: Urine culture: NGTD  5/2: BC x2: NGTD 5/7 resp >> H flu 5/7 bld >> negative 5/11 BCx2 >> negative 5/11 trach asp >> E coli  Antimicrobials:  4/30 - 5/2: Cefepime 5/2 - 5/4: Augmentin  5/5 - 5/6 unasyn   Vanc 5/11 >> 5/13 Cefepime 5/11 >> 5/13 Ancef 5/14 >> 5/17  Interim history/subjective:  No significant changes, weaning oxygen  Objective   Blood pressure 114/75, pulse 92, temperature 98.3 F (36.8 C), temperature source Axillary, resp. rate (Abnormal) 29, height 6' (1.829 m), weight 98.7 kg, SpO2 96 %.    FiO2 (%):  [60 %] 60 %   Intake/Output Summary (Last 24 hours) at 07/21/2019 0844 Last data filed at 07/21/2019 0800 Gross per 24 hour  Intake 2528.69 ml  Output 2550 ml  Net -21.31 ml   Filed Weights   07/19/19 0500 07/20/19 0500 07/21/19 0500  Weight: 102.7 kg 102.7 kg  98.7 kg    Examination: General this is a 49 year old white male lying in bed he is currently in no acute distress HEENT normocephalic atraumatic size 6 tracheostomy in place, cuff is inflated, currently with  humidified T-bar Pulmonary: Diminished bases no accessory use some scattered rhonchi with cough.  Was on 60%, he has been titrated by myself down to 45% aerosol T-bar Cardiac regular rate and rhythm Abdomen soft PEG unremarkable Neuro awake, will follow commands with left hand only, seems to be slightly more interactive today.  Right side remains hemiparetic Extremities warm dry diffuse anasarca GU clear yellow   Resolved Hospital Problem list   treated HCAP with Moraxella and Haemophilus influenza, these were beta-lactamase positive, antibiotics were completed 5/6; COPD exacerbation. Ileus, AKI, hypernatremia Completed E. coli HCAP treatment on 5/18 Assessment & Plan:   Acute hypoxic respiratory failure with compromised airway. Failure to wean s/p tracheostomy. Hx of smoking with presumed COPD. Continue to wean supplemental oxygen Personal review of portable chest x-ray shows improving left sided airspace disease/edema.  Right basilar atelectasis persists but overall aeration improving as has supplemental oxygen requirements Plan Continue supplemental oxygen with pulse oximetry  We will attempt to transition to aerosol trach collar Repeating Lasix, 12 L positive without If we can get him over to trach collar then I think we can safely move him to stepdown, he currently is on T-bar  Large Lt ICA/MCA CVA s/p thrombectomy and decompressive hemicraniectomy 2nd to cerebral edema and hemorrhage. Plan Per stroke and neurosurgical team  Mild hypernatremia Plan Continue free water replacement, I am adjusting this will repeat chemistry in a.m.  B/l lower extremity DVT. - s/p IVC filter 5/09 Plan IR will need to follow-up in a regards to IVC filter follow-up Continue enoxaparin, prophylactic dosing  SVT. Plan Continue Lopressor   Best practice:  Diet: Tube Feeds  DVT prophylaxis:  IVC filter 5/9 GI prophylaxis: Protonix Mobility: PT/ OT Code Status: FULL Disposition: I would  keep him in the intensive care for 1 more day given his high FiO2 requirements  Labs:   CMP Latest Ref Rng & Units 07/21/2019 07/20/2019 07/19/2019  Glucose 70 - 99 mg/dL 120(H) 122(H) 126(H)  BUN 6 - 20 mg/dL 33(H) 29(H) 28(H)  Creatinine 0.61 - 1.24 mg/dL 0.91 0.94 0.93  Sodium 135 - 145 mmol/L 146(H) 146(H) 146(H)  Potassium 3.5 - 5.1 mmol/L 3.8 4.1 3.7  Chloride 98 - 111 mmol/L 106 112(H) 114(H)  CO2 22 - 32 mmol/L 30 26 25   Calcium 8.9 - 10.3 mg/dL 9.4 9.4 9.3  Total Protein 6.5 - 8.1 g/dL - - 6.0(L)  Total Bilirubin 0.3 - 1.2 mg/dL - - 0.5  Alkaline Phos 38 - 126 U/L - - 75  AST 15 - 41 U/L - - 54(H)  ALT 0 - 44 U/L - - 102(H)    CBC Latest Ref Rng & Units 07/21/2019 07/20/2019 07/19/2019  WBC 4.0 - 10.5 K/uL 13.2(H) 12.8(H) 12.9(H)  Hemoglobin 13.0 - 17.0 g/dL 12.5(L) 12.6(L) 11.4(L)  Hematocrit 39.0 - 52.0 % 40.8 40.2 37.7(L)  Platelets 150 - 400 K/uL 165 PLATELET CLUMPS NOTED ON SMEAR, UNABLE TO ESTIMATE 136(L)    ABG    Component Value Date/Time   PHART 7.420 07/15/2019 0544   PCO2ART 37.8 07/15/2019 0544   PO2ART 102 07/15/2019 0544   HCO3 24.4 07/15/2019 0544   TCO2 26 07/15/2019 0544   ACIDBASEDEF 3.0 (H) 07/02/2019 0806   O2SAT 98.0 07/15/2019 0544    CBG (last 3)  Recent Labs    07/20/19 2328 07/21/19 0320 07/21/19 0800  GLUCAP 122* 127* 117*    Signature:  Simonne Martinet ACNP-BC [facility] Pulmonary/Critical Care Pager # [phone] OR # [phone] if no answer

## 2019-07-21 NOTE — Progress Notes (Signed)
STROKE TEAM PROGRESS NOTE   INTERVAL HISTORY RN at bedside. Pt stable overnight, on trach collar, decreased secretions. On lasix. Neuro unchanged. Scalp staple removed and replaced with suture. No CSF drainage.   Vitals:   07/21/19 0700 07/21/19 0800 07/21/19 0846 07/21/19 0900  BP: 114/75 109/73 109/73 108/71  Pulse: 92 95 (!) 109 (!) 106  Resp: (!) 29 (!) 23 (!) 24 (!) 30  Temp:  98.3 F (36.8 C)    TempSrc:  Axillary    SpO2: 96% 97% 92% 94%  Weight:      Height:       CBC:  Recent Labs  Lab 07/20/19 0252 07/21/19 0454  WBC 12.8* 13.2*  HGB 12.6* 12.5*  HCT 40.2 40.8  MCV 94.4 94.2  PLT PLATELET CLUMPS NOTED ON SMEAR, UNABLE TO ESTIMATE 165   Basic Metabolic Panel:  Recent Labs  Lab 07/16/19 0658 07/16/19 0658 07/17/19 0528 07/18/19 0254 07/20/19 0252 07/21/19 0454  NA 154*   < > 150*   < > 146* 146*  K 4.4   < > 3.8   < > 4.1 3.8  CL 121*   < > 118*   < > 112* 106  CO2 24   < > 25   < > 26 30  GLUCOSE 121*   < > 116*   < > 122* 120*  BUN 48*   < > 35*   < > 29* 33*  CREATININE 1.20   < > 1.06   < > 0.94 0.91  CALCIUM 9.4   < > 9.5   < > 9.4 9.4  MG 2.7*  --  2.5*  --   --   --   PHOS 2.9  --  3.2  --   --   --    < > = values in this interval not displayed.    IMAGING past 24 hours DG Chest Port 1 View  Result Date: 07/21/2019 CLINICAL DATA:  Acute respiratory failure. EXAM: PORTABLE CHEST 1 VIEW COMPARISON:  07/18/2019 FINDINGS: The tracheostomy tube is stable. Streaky bibasilar atelectasis but slight improvement. No pleural effusions or pneumothorax. IMPRESSION: Slight improvement in bibasilar atelectasis. Electronically Signed   By: Rudie Meyer M.D.   On: 07/21/2019 07:18    PHYSICAL EXAM    General - mildly obese middle-aged Caucasian male, s/p tracheostomy on trach collar  Ophthalmologic - fundi not visualized due to noncooperation.  Cardiovascular - Regular rate and rhythm.  Neuro - s/p tracheostomy on trach collar. No sedation, eyes closed  but easily open with voice. Still not following any commands today.   left gaze preference position, barely cross midline, not blinking to visual threat on the left, doll's eyes present, pupil left 70mm, right 4.46mm, both reactive to light. Corneal reflex weak on the right and brisk on the left, gag and cough present. Breathing over the vent.  right facial droop.  Tongue protrusion not cooperative. Spontaneous and purposeful movement left UE 4/5 and at least LE 2/5. However, right UE and LE plegic. DTR 1+ and no babinski. Sensation, coordination and gait not tested.    ASSESSMENT/PLAN Mr. AINSLEY SANGUINETTI is a 49 y.o. male with history of COPD, ongoing tobacco use, and psoriasis, presenting with dense LMCA syndrome. Attempt for Left ICA and Left M1 thrombectomy was done emergently with TICI 2b flow. Unfortunately, the pt had post procedure hemorrhage and reocclusion of the vessels. F/u imaging suggested early edema and pt underwent prophylactic decompressive hemicraniectomy.  Patient was  enrolled in the CHARM trial for cytotoxic edema but was a screen failure due to increase core size greater than 300 mL on DWI  Stroke: Large LMCA and ACA stroke d/t terminal left ICA occlusions s/p EVT but with reocclusion and malignant cerebral edema s/p left depressive hemicraniectomy.  Stroke etiology unknown - given DVT requiring eventual AC and high mRS, will not do further embolic workup  Code Stroke CT head showed early hypodensity in nearly entire LMCA. ASPECTS 6-7 per notes.  CTA head & neck occlusion cervical ICA on the left. This is most likely due to dissection. The patient has minimal atherosclerotic disease.  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  There is poor collateral circulation on the left.   CTP CBF large core volume of 266 mL, perfusion volume 313 mL, mismatch volume 47 mL.  ASPECTS using CTP by Dr. Chestine Spore 6 or 7.   IR  - TICI2c revascularization  MRI  Restricted diffusion throughout the majority of the left MCA and ACA vascular territories consistent with acute ischemia.  MRA - reocclusion of the intracranial left internal carotid artery. No flow related signal is seen within the left middle cerebral artery.  CT Head 07/04/19 - Slightly increased rightward midline shift with a small amount of herniation beneath the anterior falx. Otherwise unchanged examination.  CT repeat 5/6 stable infarcts and SAH but increasing edema w/ midline shift now 87mm and herniation through crani site  2D Echo - EF 55 - 60%. No cardiac source of emboli identified.   LDL 45  HgbA1c 5.5  Subq heparin for VTE prophylaxis  No antithrombotic prior to admission, no antithrombotics d/t hemorrhagic conversion, now on aspirin 81. Consider AC in 2 mos  Therapy recommendations:  SNF  Disposition:  Pending - LTACH consulted however no insurance  Respiratory failure s/p Trach Trach wound  Intubated on vent  CCM on board  CXR 07/03/19- New consolidation in the right lower lobe and left infrahilar region concerning for pneumonia. Possible small layering right effusion.  CXR 5/3 mild pulm edema w/ atx vs infiltrate/aspiration  On cefepime 4/30>>5/2 for pneumonia  Augmentin 5/2>>5/4  Unasyn 5/5>>5/6  Trach placed 5/8 per CCM  WOC consulted for trach wound ->progressed to stage 3 pressure, for dressing change q3d and prn  Fever and elevated white count and tachycardia hence restarted on Vanc & Cefepime 5/11>>5/13  Ancef 5/14>>5/17  (pneumonia)  On trach collar since 5/14 w/ high FiO2 requirements  Transition to aerosol TC, diurese w/ K replacement  Hope transition to step-down soon  Cerebral edema Hypernatremia   MRI - Restricted diffusion throughout the majority of the left MCA and ACA vascular territories consistent with acute ischemia.  CT Head 07/04/19 - Slightly increased rightward midline shift with a small  amount of herniation beneath the anterior falx. Otherwise unchanged examination.  S/p decompressive left hemicraniectomy  4/28 Dr Franky Macho  CT 5/6 - stable infarcts and SAH but increasing edema w/ midline shift now 86mm and herniation through crani site  On keppra. Plan 2 wk course per NSG  Off 3%. On D5NS @ 75 - off  Okay to allow Na gradually normalize  Na lowering 150->149->146->146  Increase Free water to 250 q4h + TF     Crani sutures out   Crani wound w/ drainage, NS sutured incision where leaking 5/17  Drainage from crani site  Large amount of CSF drainage from left crani site  5/16  NSG Dr. Ronnald Ramp made 2 staples at incision site  Wound dry and clean 5/17  Continue to monitor  Sepsis  Fever w/ leukocytosis HCAP  Afebrile->99.4->99->afebrile  Leukocytosis - 11.0->9.7->12.1->12.9->12.8->13.2  UA neg  Sputum moraxella, H. flu   On cefepime 4/30>>5/2; Augmentin 5/2>>5/4, Unasyn 5/5>>5/6 -> off  Blood culture 5/2 no growth  Blood cultures 5/7 - no growth   Resp cultures 5/7 - no growth  CXR 5/9 - streaky BLL opacilites atx/consolication/PNA. Possible R pleural effusion  CXR 5/10 w/ atx vs infiltrate  CXR 07/15/19 - Stable bibasilar atelectasis a small right pleural effusion. No new opacity evident. Stable cardiac silhouette  Sputum cx 5/11 reincubated   Blood Cx 5/11 no growth   Vanc 5/11>>5/13, Cefepime 5/11>>5/13  Ancef 5/14>>5/17  (pneumonia)  CXR 5/18 slight improvement in bibasilar atx  Bilateral Lower Ext DVT, extensive Possible PE given tachycardia  Venous US 07/11/2019 DVT + bilateral extensive  IVC filter placed 5/9  On heparin subq for DVT prophylaxis  On aspirin 81 now; consider AC in 2 mos   Hypertension  Home meds:  None  Current meds: none scheduled  BP stable  SBP goal < 160 due to hemorrhagic conversion . Long-term BP goal normotensive  Dysphagia   NPO  Coretrak 5/7  PEG placement 5/14  On TF @ 55  Ileus,  resolved  abd distension w/ decreased BS  KUB 5/2 distended colonic loop  Miralax   Flexiseal following laxatives  On reglan, laxatives on hold, decrease fentanyl  Coretrak 5/7  PEG 5/14  TF resumed after PEG  Acute Renal Failure    Renal US - increased cortical echogenicity in B kidneys c/w renal dz. Increased echogenicity of liver ?hepatic steatosis.  Urine studies - elevated protein creatinine ratio, Na normal, UN normal  Creatinine - 1.20->1.06->0.93->0.94->0.91  Diuresis - per CCM  Tobacco abuse  Current smoker  Smoking cessation counseling will be provided if able  Other Stroke Risk Factors  Obesity, Body mass index is 29.51 kg/m., recommend weight loss, diet and exercise as appropriate    Likely undiagnosed obstructive sleep apnea based on body habitus  Other Active Problems  Code Status - Full code  Hypokalemia - resolved   Thrombocytopenia - 132->133->136->165  Elevated LFT - AST/ALT 85/156 -> 54/102, improved     Hospital day # 20  Patient continues to be critically ill for the last 24 hours, has continued copious secretion although better than yesterday but still needed frequent suctioning, still has tachypnea but seems not in respiratory distress, will need continued monitoring. I have discussed with CCM NP Pete.  This patient is critically ill due to respiratory failure s/p trach still with tachypnea and copious secretions, large left MCA ACA infarct with cerebral edema s/p hemicrani, extensive DVT bilaterally s/p IVC filter, illeus, fever and leukocytosis with pneumonia and at significant risk of neurological worsening, death form recurrent stroke, PE, sepsis, cerebral edema and brain herniation, illeus, seizure. This patient's care requires constant monitoring of vital signs, hemodynamics, respiratory and cardiac monitoring, review of multiple databases, neurological assessment, discussion with family, other specialists and medical decision making  of high complexity. I spent 30 minutes of neurocritical care time in the care of this patient.   Rosalin Hawking, MD PhD Stroke Neurology 07/21/2019 10:26 AM   To contact Stroke Continuity provider, please refer to [email or web address] After hours, contact General Neurology

## 2019-07-21 NOTE — Progress Notes (Signed)
Occupational Therapy Treatment Patient Details Name: Lee Hooper MRN: [id number] DOB: 1970/10/26 Today's Date: 07/21/2019    History of present illness This 49 y.o. male admitted 4/28 with Rt sided hemiplegia and aphasia.  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  Repeat CT following procedure showed moderate basal ganglia hemorrhage vs. stroke   Repeat MRI revealed that the ICA and MCA re-occluded with change in FLAIR sequence excluding him from repeat thrombectomy.  He underwent a decompressive hemicraniectomy.  He waws intubated 4/28.  He developed an ileus 5/2. Trach 5/8. IVC filter placed 5/9.  Peg placement.  PMH includes: COPD, tobacco abuse, psoriasis   OT comments  Pt seen on TC 40% FiO2 which was increased to 60% at end of session due to SpO2 @87 ; Max HR 124. Total A +2 for bed mobility and to transition to EOB. Pt with apparent pusher syndrome - pushing R due to poor midline orientation. Pt able to attend to pictures while sitting EOB however unable to track into R visual field. Minimal initiation of head/neck extension when vertically tracking pictures.  Pt more alert this session and able to maintain midline postural control with max A when LUE removed from bed. Hand over hand initiation to brush teeth with improved ability to sustain attention to task. Pt held phone with scrolled through pictures using L hand but did not attempt to navigate car part site. Flaccid RUE with inferior subluxation noted. Recommend prevalon boot R foot; Will monitor for need of R resting hand splint. Recommend pt placed in chair position at least TID. Will continue to follow acutely. Recommend rehab at SNF.   Follow Up Recommendations  SNF;Supervision/Assistance - 24 hour    Equipment Recommendations  Other (comment)(TBA)    Recommendations for Other Services      Precautions / Restrictions Precautions Precautions: Fall Precaution Comments: R hemi, trach, coretrach, flexiseal; No  Bone flap L head;crani       Mobility Bed Mobility Overal bed mobility: Needs Assistance Bed Mobility: Rolling;Sidelying to Sit;Sit to Sidelying Rolling: Max assist;+2 for physical assistance Sidelying to sit: Total assist;+2 for physical assistance     Sit to sidelying: Total assist;+2 for physical assistance    Transfers                 General transfer comment: unable to attempt ; total A; needs Maxisky    Balance Overall balance assessment: Needs assistance   Sitting balance-Leahy Scale: Poor 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; once LUE removed from bed, pt able to maintain midline postural control with Max A to prevent pt from falling posteriorly; When support remvoed, pt with no intiation of correcting posture when leaning forward and to R                                   ADL either performed or assessed with clinical judgement   ADL Overall ADL's : Needs assistance/impaired Eating/Feeding: NPO   Grooming: Maximal assistance Grooming Details (indicate cue type and reason): Pt would move [place] around in mouth today. Hand ove hand to put into mouthwash tray. Hand over hand to place in mouth as pt moving [place] toward mouth but rubbing outside of cheek Upper Body Bathing: Maximal assistance Upper Body Bathing Details (indicate cue type and reason): hand over hand to initiate adn maintian attention to task  General ADL Comments: total A with ADL     Vision   Vision Assessment?: Vision impaired- to be further tested in functional context Additional Comments: able to track to midline; unable to track past midlein into R visual field   Perception     Praxis      Cognition Arousal/Alertness: Lethargic Behavior During Therapy: Flat affect;Impulsive;Restless Overall Cognitive Status: Impaired/Different from baseline Area of Impairment: Attention;Following  commands;Safety/judgement;Awareness;Problem solving                   Current Attention Level: Focused   Following Commands: Follows one step commands inconsistently Safety/Judgement: Decreased awareness of safety;Decreased awareness of deficits Awareness: Intellectual Problem Solving: Slow processing;Decreased initiation;Difficulty sequencing;Requires verbal cues;Requires tactile cues          Exercises General Exercises - Upper Extremity Shoulder Flexion: PROM;Right;5 reps Shoulder ABduction: PROM;Right;5 reps Elbow Flexion: PROM;Right;5 reps Elbow Extension: PROM;Right;5 reps Wrist Flexion: PROM;Right;5 reps Wrist Extension: PROM;Right;5 reps Digit Composite Flexion: PROM;Right;5 reps Composite Extension: PROM;Right;5 reps   Shoulder Instructions       General Comments      Pertinent Vitals/ Pain       Pain Assessment: Faces Faces Pain Scale: Hurts little more Pain Location: grimacing at times with movement Pain Descriptors / Indicators: Grimacing Pain Intervention(s): Limited activity within patient's tolerance  Home Living                                          Prior Functioning/Environment              Frequency  Min 2X/week        Progress Toward Goals  OT Goals(current goals can now be found in the care plan section)  Progress towards OT goals: Progressing toward goals(goals remain appropriate)  Acute Rehab OT Goals Patient Stated Goal: none stated by patient OT Goal Formulation: Patient unable to participate in goal setting Time For Goal Achievement: 08/04/19 Potential to Achieve Goals: Fair ADL Goals Pt Will Perform Grooming: with mod assist;bed level Additional ADL Goal #1: Pt will locate items on his Rt with mod cues/prompting Additional ADL Goal #2: Pt will maintain EOB sitting x 5 mins with max A in prep for ADLs Additional ADL Goal #3: Pt will demonstrate sustained attention during simple ADLs with Min  cues Additional ADL Goal #4: Pt will perform bed mobility with Mod A +2 in preparation for ADLs  Plan Discharge plan remains appropriate    Co-evaluation    PT/OT/SLP Co-Evaluation/Treatment: Yes Reason for Co-Treatment: Complexity of the patient's impairments (multi-system involvement);Necessary to address cognition/behavior during functional activity;For patient/therapist safety   OT goals addressed during session: ADL's and self-care;Strengthening/ROM      AM-PAC OT "6 Clicks" Daily Activity     Outcome Measure   Help from another person eating meals?: Total Help from another person taking care of personal grooming?: A Lot Help from another person toileting, which includes using toliet, bedpan, or urinal?: Total Help from another person bathing (including washing, rinsing, drying)?: Total Help from another person to put on and taking off regular upper body clothing?: Total Help from another person to put on and taking off regular lower body clothing?: Total 6 Click Score: 7    End of Session Equipment Utilized During Treatment: Oxygen(40%;increased to 60%)  OT Visit Diagnosis: Unsteadiness on feet (R26.81);Cognitive communication deficit (R41.841);Hemiplegia and hemiparesis;Muscle weakness (generalized) (M62.81);Apraxia (R48.2);Other  symptoms and signs involving cognitive function;Pain Symptoms and signs involving cognitive functions: Cerebral infarction Hemiplegia - Right/Left: Right Hemiplegia - dominant/non-dominant: Dominant Hemiplegia - caused by: Cerebral infarction Pain - Right/Left: Right Pain - part of body: (generalized)   Activity Tolerance Patient limited by lethargy   Patient Left in bed;with call bell/phone within reach;with bed alarm set(modified chair position)   Nurse Communication Mobility status;Other (comment)(?need for suction)        Time: 1240-1316 OT Time Calculation (min): 36 min  Charges: OT General Charges $OT Visit: 1 Visit OT  Treatments $Therapeutic Activity: 8-22 mins  Luisa Dago, OT/L   Acute OT Clinical Specialist Acute Rehabilitation Services Pager [phone] Office [phone]    [facility] 07/21/2019, 3:05 PM

## 2019-07-22 LAB — BASIC METABOLIC PANEL
Anion gap: 7 (ref 5–15)
BUN: 35 mg/dL — ABNORMAL HIGH (ref 6–20)
CO2: 33 mmol/L — ABNORMAL HIGH (ref 22–32)
Calcium: 9.4 mg/dL (ref 8.9–10.3)
Chloride: 105 mmol/L (ref 98–111)
Creatinine, Ser: 0.97 mg/dL (ref 0.61–1.24)
GFR calc Af Amer: >60 mL/min (ref >60)
GFR calc non Af Amer: >60 mL/min (ref >60)
Glucose, Bld: 154 mg/dL — ABNORMAL HIGH (ref 70–99)
Potassium: 3.8 mmol/L (ref 3.5–5.1)
Sodium: 145 mmol/L (ref 135–145)

## 2019-07-22 LAB — GLUCOSE, CAPILLARY
Glucose-Capillary: 106 mg/dL — ABNORMAL HIGH (ref 70–99)
Glucose-Capillary: 123 mg/dL — ABNORMAL HIGH (ref 70–99)
Glucose-Capillary: 126 mg/dL — ABNORMAL HIGH (ref 70–99)
Glucose-Capillary: 132 mg/dL — ABNORMAL HIGH (ref 70–99)
Glucose-Capillary: 132 mg/dL — ABNORMAL HIGH (ref 70–99)
Glucose-Capillary: 142 mg/dL — ABNORMAL HIGH (ref 70–99)

## 2019-07-22 LAB — CBC
HCT: 40.3 % (ref 39.0–52.0)
Hemoglobin: 12.6 g/dL — ABNORMAL LOW (ref 13.0–17.0)
MCH: 29.4 pg (ref 26.0–34.0)
MCHC: 31.3 g/dL (ref 30.0–36.0)
MCV: 93.9 fL (ref 80.0–100.0)
Platelets: 185 10*3/uL (ref 150–400)
RBC: 4.29 MIL/uL (ref 4.22–5.81)
RDW: 15.5 % (ref 11.5–15.5)
WBC: 12.4 10*3/uL — ABNORMAL HIGH (ref 4.0–10.5)
nRBC: 0.4 % — ABNORMAL HIGH (ref 0.0–0.2)

## 2019-07-22 MED ORDER — POLYETHYLENE GLYCOL 3350 17 G PO PACK
17.0000 g | PACK | Freq: Every day | ORAL | Status: DC
  Administered 2019-07-22 – 2019-07-23 (×2): 17 g
  Filled 2019-07-22 (×2): qty 1

## 2019-07-22 MED ORDER — ORAL CARE MOUTH RINSE
15.0000 mL | Freq: Two times a day (BID) | OROMUCOSAL | Status: DC
  Administered 2019-07-22 – 2019-08-31 (×78): 15 mL via OROMUCOSAL

## 2019-07-22 NOTE — Progress Notes (Signed)
STROKE TEAM PROGRESS NOTE   INTERVAL HISTORY No family at the bedside.  Patient lying in bed, on trach collar, neuro stable, unchanged.  CCM follow with trach.  Vitals:   07/22/19 0600 07/22/19 0800 07/22/19 0817 07/22/19 0818  BP:  117/77  117/77  Pulse: 92 87  96  Resp: (!) 25 (!) 21  16  Temp:      TempSrc:      SpO2: 96% 96% 90%   Weight:      Height:       CBC:  Recent Labs  Lab 07/21/19 0454 07/22/19 0343  WBC 13.2* 12.4*  HGB 12.5* 12.6*  HCT 40.8 40.3  MCV 94.2 93.9  PLT 165 185   Basic Metabolic Panel:  Recent Labs  Lab 07/16/19 0658 07/16/19 0658 07/17/19 0528 07/18/19 0254 07/21/19 0454 07/22/19 0343  NA 154*   < > 150*   < > 146* 145  K 4.4   < > 3.8   < > 3.8 3.8  CL 121*   < > 118*   < > 106 105  CO2 24   < > 25   < > 30 33*  GLUCOSE 121*   < > 116*   < > 120* 154*  BUN 48*   < > 35*   < > 33* 35*  CREATININE 1.20   < > 1.06   < > 0.91 0.97  CALCIUM 9.4   < > 9.5   < > 9.4 9.4  MG 2.7*  --  2.5*  --   --   --   PHOS 2.9  --  3.2  --   --   --    < > = values in this interval not displayed.    IMAGING past 24 hours No results found.  PHYSICAL EXAM    General - mildly obese middle-aged Caucasian male, s/p tracheostomy on trach collar  Ophthalmologic - fundi not visualized due to noncooperation.  Cardiovascular - Regular rate and rhythm.  Neuro - s/p tracheostomy on trach collar. No sedation, eyes closed but easily open with voice. Still not following any commands today.   left gaze preference position, barely cross midline, not blinking to visual threat on the left, doll's eyes present, pupil left 57mm, right 4.68mm, both reactive to light. Corneal reflex weak on the right and brisk on the left, gag and cough present. Breathing over the vent.  right facial droop.  Tongue protrusion not cooperative. Spontaneous and purposeful movement left UE 4/5 and at least LE 2/5. However, right UE and LE plegic. DTR 1+ and no babinski. Sensation, coordination  and gait not tested.    ASSESSMENT/PLAN Mr. FATEH KINDLE is a 49 y.o. male with history of COPD, ongoing tobacco use, and psoriasis, presenting with dense LMCA syndrome. Attempt for Left ICA and Left M1 thrombectomy was done emergently with TICI 2b flow. Unfortunately, the pt had post procedure hemorrhage and reocclusion of the vessels. F/u imaging suggested early edema and pt underwent prophylactic decompressive hemicraniectomy.  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  Stroke etiology unknown - given DVT requiring eventual AC and high mRS, will not do further embolic workup  Code Stroke CT head showed early hypodensity in nearly  entire LMCA. ASPECTS 6-7 per notes.  CTA head & neck occlusion cervical ICA on the left. This is most likely due to dissection. The patient has minimal atherosclerotic disease.  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  There is poor collateral circulation on the left.   CTP CBF large core volume of 266 mL, perfusion volume 313 mL, mismatch volume 47 mL.  ASPECTS using CTP by Dr. Chestine Spore 6 or 7.   IR - TICI2c revascularization  MRI  Restricted diffusion throughout the majority of the left MCA and ACA vascular territories consistent with acute ischemia.  MRA - reocclusion of the intracranial left internal carotid artery. No flow related signal is seen within the left middle cerebral artery.  CT Head 07/04/19 - Slightly increased rightward midline shift with a small amount of herniation beneath the anterior falx. Otherwise unchanged examination.  CT repeat 5/6 stable infarcts and SAH but increasing edema w/ midline shift now 74mm and  herniation through crani site  CT repeat pending  2D Echo - EF 55 - 60%. No cardiac source of emboli identified.   LDL 45  HgbA1c 5.5  Subq heparin for VTE prophylaxis  No antithrombotic prior to admission, no antithrombotics d/t hemorrhagic conversion, now on aspirin 81. Consider AC if CT repeat shows hemorrhage resolves  Therapy recommendations:  SNF  Disposition:  Pending - LTACH consulted however no insurance  Respiratory failure s/p Trach Trach wound  Intubated on vent  CCM on board  CXR 07/03/19- New consolidation in the right lower lobe and left infrahilar region concerning for pneumonia. Possible small layering right effusion.  CXR 5/3 mild pulm edema w/ atx vs infiltrate/aspiration  On cefepime 4/30>>5/2 for pneumonia  Augmentin 5/2>>5/4  Unasyn 5/5>>5/6  Trach placed 5/8 per CCM  WOC consulted for trach wound ->progressed to stage 3 pressure, for dressing change q3d and prn  Fever and elevated white count and tachycardia hence restarted on Vanc & Cefepime 5/11>>5/13  Ancef 5/14>>5/17  (pneumonia)  On trach collar since 5/14 w/ high FiO2 requirements  Hope transition to step-down soon  Cerebral edema Hypernatremia   MRI - Restricted diffusion throughout the majority of the left MCA and ACA vascular territories consistent with acute ischemia.  CT Head 07/04/19 - Slightly increased rightward midline shift with a small amount of herniation beneath the anterior falx. Otherwise unchanged examination.  S/p decompressive left hemicraniectomy  4/28 Dr Franky Macho  CT 5/6 - stable infarcts and SAH but increasing edema w/ midline shift now 18mm and herniation through crani site  On keppra. Plan 2 wk course per NSG  Off 3%. On D5NS @ 75 - off  Okay to allow Na gradually normalize  Na lowering 150->149->146->146->145  Crani sutures out -> sutured incision leaking 5/16 - s/p local suture  Drainage from crani site  Large amount of CSF drainage from left crani  site 5/16  NSG Dr. Yetta Barre made 2 staples at incision site -> changed to suture  Wound dry and clean 5/17  Continue to monitor  Sepsis  Fever w/ leukocytosis HCAP  Afebrile  Leukocytosis - 11.0->9.7->12.1->12.9->12.8->13.2->12.4  UA neg  Sputum moraxella, H. flu   On cefepime 4/30>>5/2; Augmentin 5/2>>5/4, Unasyn 5/5>>5/6 -> off  Blood culture 5/2 no growth  Blood cultures 5/7 - no growth   Resp cultures 5/7 - no growth  CXR 5/9 - streaky BLL opacilites atx/consolication/PNA. Possible R pleural effusion  CXR 5/10 w/ atx  vs infiltrate  CXR 07/15/19 - Stable bibasilar atelectasis a small right pleural effusion. No new opacity evident. Stable cardiac silhouette  Sputum cx 5/11 reincubated   Blood Cx 5/11 no growth   Vanc 5/11>>5/13, Cefepime 5/11>>5/13  Ancef 5/14>>5/17  (pneumonia)  CXR 5/18 slight improvement in bibasilar atx  Bilateral Lower Ext DVT, extensive Possible PE given tachycardia  Venous US 07/11/2019 DVT + bilateral extensive  IVC filter placed 5/9  On heparin subq for DVT prophylaxis  On aspirin 81 now; Consider AC if CT repeat shows hemorrhage resolves  Hypertension  Home meds:  None  Current meds: none scheduled  BP stable  SBP goal < 160 due to hemorrhagic conversion . Long-term BP goal normotensive  Dysphagia   NPO  Coretrak 5/7  PEG placement 5/14  On TF @ 73  FW 250 Q4h  Ileus, resolved  abd distension w/ decreased BS  KUB 5/2 distended colonic loop  Miralax   Flexiseal following laxatives  On reglan, laxatives on hold, decrease fentanyl  Coretrak 5/7  PEG 5/14  TF resumed after PEG  Acute Renal Failure    Renal US - increased cortical echogenicity in B kidneys c/w renal dz. Increased echogenicity of liver ?hepatic steatosis.  Urine studies - elevated protein creatinine ratio, Na normal, UN normal  Creatinine - 1.20->1.06->0.93->0.94->0.91->0.97  Diuresis - per CCM  Tobacco abuse  Current  smoker  Smoking cessation counseling will be provided if able  Other Stroke Risk Factors  Obesity, Body mass index is 29.45 kg/m., recommend weight loss, diet and exercise as appropriate    Likely undiagnosed obstructive sleep apnea based on body habitus  Other Active Problems  Code Status - Full code  Hypokalemia - resolved   Thrombocytopenia - 132->133->136->165->185  Elevated LFT - AST/ALT 85/156 -> 54/102, improved    Constipation. Added Miralax to regimen  Hospital day # 21   Rosalin Hawking, MD PhD Stroke Neurology 07/22/2019 10:08 AM   To contact Stroke Continuity provider, please refer to [email or web address] After hours, contact General Neurology

## 2019-07-22 NOTE — Progress Notes (Signed)
NAME:  Lee Hooper, MRN:  [id number], DOB:  [birth year], LOS: 21 ADMISSION DATE:  07/01/2019, CONSULTATION DATE:  07/01/19 REFERRING MD:  Aroor MD, CHIEF COMPLAINT:  CVA    Brief History   Lee Hooper is a 49 yo M who presented as a code stroke after being found aphasic and nonverbal by his wife. CT/CTA studies showed left ICA/MCA thrombosis and pt was taken for IR thrombectomy for left ICA and left M1 occlusions. Repeat head CT was obtained following the procedure out of concern for possible hemorrhage and showed moderate basal ganglia hemorrhage vs stroke. Repeat MRI revealed that the ICA and MCA re-occluded with change in FLAIR sequence excluding him from repeat thrombectomy. He underwent a L hemicraniectomy. He was intubated for airway protection.  Past Medical History  Psoriasis  COPD  Tobacco Use  Significant Hospital Events   4/28: Admit; L ICA/MCA Thrombectomy with re-occlusion --> left hemicraniectomy, intubated for acute hypoxic respiratory failure  5/3: NAE. Was taken off precedex yesterday and pt opened his eyes. Prednisone 50 mg qd was begun. He had a persistent fever around 38.2, HR 110s, BP 140s/60s. Pt required increased O2 with secretions; CXR showed mild edema with small effusions c/f aspiration vs opacification. Blood culture x2 and urine culture collected, NGTD. ABG pending. Currently satting 92% on PRVC FiO2 80, PEEP 10, Plateau 22. WBC declined to 10.5 from 11.1, Na 151 from 156. Tube feeds were held d/t distended colonic loop in RLQ on KUB, to be continued today. Thrombocytopenia improved to 93 from 81.  5/4: Patient remains intubated with spontaneous movement of left arm and leg.  5/5: NAE. Pt remains intubated on PRN fetanyl. Will open eyes and spontaneously move left leg and arm.  5/6: NAE overnight. Intubated with no sedation. 5/7: NAE. Marland Kitchen Completed Unasyn yesterday. VSS, spO2 remains in low 90s the majority of the time on 620/40%/+5. Morning labs significant  for HCO3 of 19. Otherwise unremarkable.  5/8: Ongoing fever.  Obtained ultrasound, positive for lower extremity DVT bilaterally.  Tracheostomy placed at bedside.,  Did have some SVT this responded to treating fever and rate control. 5/9: IVC filter placed 5/11: no acute events overnight. Pt with similar exam as previous however tachycardic and febrile (tmax102.7) with a bit increased wbc and new R infiltrate noted in base. Pan cx and start empiric abx. 5/12 Tolerated 8 hours of trach collar  5/17 completed antibiotics, added IV Lasix, for ongoing high supplemental oxygen requirements.  Because of this we delayed transfer out 5/18 improved oxygenation, improved x-ray.  Weaning oxygen.  Transitioning to aerosol trach collar Consults:  IR PCCM  Neuro (Primary) Neurosurgery  Procedures:  4/28 IR - L ICA/MCA Thrombectomy 4/28 L hemicraniectomy 4/28 Intubation 5/08 Trach   Significant Diagnostic Tests:  4/28 Admit CT Head Code Stroke:  Hyperdense left ICA and MCA compatible with acute thrombus. No acute infarct or hemorrhage.  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 The patient has minimal atherosclerotic disease. The left internal carotid artery is occluded through the terminus and extending in the left M1 M2 and M3 branches. There is poor collateral circulation on the left. There is a large territory infarct involving the left hemisphere. Infarct volume 266 mL.  4/28 Admit MR Brain:  Restricted diffusion throughout much of the left MCA vascular territory consistent with acute ischemia. Restricted diffusion consistent with acute ischemia is also present within the paramedian  left frontoparietal lobes, although this is less well assessed due to the degree of motion degradation at the level of the vertex. There is little if any corresponding T2/FLAIR hyperintensity at these sites. No significant  mass effect. No midline shift.  4/28 Post Thrombectomy CT Head: : 1. 3.0 x 2.6 x 3.9 cm region of hyperdensity centered within the left basal ganglia, left subinsular region and inferomedial left temporal lobe likely reflecting a combination of parenchymal hematoma and contrast staining.  2. Edema with loss of gray-white differentiation within the left basal ganglia, left insula and anterior left temporal lobe, likely acute infarction. Subtle changes of acute infarction are also suspected within the paramedian left frontal lobe ACA vascular territory. 3. Scattered small-volume subarachnoid hemorrhage along the left cerebral hemisphere. 4. Regional mass effect with effacement of the left lateral ventricle temporal horn. No midline shift.  4/28 Post Thrombectomy MR Brain:  1. Restricted diffusion throughout the majority of the left MCA and ACA vascular territories consistent with acute ischemia. 2. No significant mass effect at this time. No midline shift. 3. The acute parenchymal hemorrhage and/or contrast staining centered within left basal ganglia and inferomedial left temporal lobe on prior head CT does not appear significantly changed. Based on the MR appearance, it is suspected that a significant component of this previously demonstrated hyperdensity reflects contrast staining. 4. Redemonstrated small volume subarachnoid hemorrhage overlying the left cerebral hemisphere. Small volume subarachnoid hemorrhage is also questioned along the right cerebral hemisphere posteriorly.  4/29 Post Hemicraniectomy CT head:  Complete left ACA/MCA territory infarction with swollen brain bulging through craniectomy defect. No midline shift or entrapment. Petechial hemorrhage at left basal ganglia. Extraaxial hemorrhage along surface of the infarct.   5/1 Head CT > Slightly increased rightward midline shift with a small amount of herniation beneath the anterior falx. Otherwise  unchanged Examination.  5/6 Head CT: Left ACA and MCA territory infarct with unchanged degree of subarachnoid hemorrhage. Swelling has progressed with greater herniation through the craniectomy defect and 5 mm of midline shift.  5/8: Bilateral acute DVT involving the right common femoral vein right proximal profundal vein and right popliteal vein also left DVT involving same vessels  5/12 Renal US: Technically challenging exam due to patient's intubated status and difficulty with repositioning.  Diffusely increased cortical echogenicity in both kidneys compatible with medical renal disease.  Increased echogenicity of the liver with diminished through transmission, most often reflective of hepatic steatosis.  Micro Data:  4/28: SARS-CoV-2/Influenza A/Influenza B PCR: Negative  4/30: Respiratory culture: Abundant Moraxella Catarrhalis, Haemophilus Influenzae, beta lactamase positive  5/2: Urine culture: NGTD  5/2: BC x2: NGTD 5/7 resp >> H flu 5/7 bld >> negative 5/11 BCx2 >> negative 5/11 trach asp >> E coli  Antimicrobials:  4/30 - 5/2: Cefepime 5/2 - 5/4: Augmentin  5/5 - 5/6 unasyn   Vanc 5/11 >> 5/13 Cefepime 5/11 >> 5/13 Ancef 5/14 >> 5/17  Interim history/subjective:  Looks the same   Objective   Blood pressure 117/77, pulse 96, temperature 98.3 F (36.8 C), temperature source Axillary, resp. rate 16, height 6' (1.829 m), weight 98.5 kg, SpO2 90 %.    FiO2 (%):  [40 %-60 %] 60 %   Intake/Output Summary (Last 24 hours) at 07/22/2019 1047 Last data filed at 07/22/2019 0800 Gross per 24 hour  Intake 2560 ml  Output 1925 ml  Net 635 ml   Filed Weights   07/20/19 0500 07/21/19 0500 07/22/19 0428  Weight: 102.7 kg 98.7 kg 98.5  kg    Examination: General this is a 49 year old white male. Resting in bed. No distress.  HENT 6 cuffed trach. Unremarkable MMM pulm some scattered rhonchi. 60% ATC  No accessory use  Card RRR abd not tender tol Tubefeeds Neuro spont moves  left. Follows commands on left. Non verbal gu cl yellow  Ext still w/ diffuse anasarca   Resolved Hospital Problem list   treated HCAP with Moraxella and Haemophilus influenza, these were beta-lactamase positive, antibiotics were completed 5/6; COPD exacerbation. Ileus, AKI, hypernatremia Completed E. coli HCAP treatment on 5/18 Assessment & Plan:   Acute hypoxic respiratory failure with compromised airway. Failure to wean s/p tracheostomy. Hx of smoking with presumed COPD. Large Lt ICA/MCA CVA s/p thrombectomy and decompressive hemicraniectomy 2nd to cerebral edema and hemorrhage. Mild hypernatremia-->improved w/ Free water replacement  B/l lower extremity DVT.- s/p IVC filter 5/09 SVT.  Discussion  Still on 60 % FIO2. Not made a lot of progress w/ I&O balance BUT CXR was looking better. Seems to be tolerating ATC well  Plan Cont ATC  Wean for sats > 90% Cont to push lasix as BP/BUN/cr allow Empirically replace lytes as needed.  OK for neuro-stepdown from our standpoint We will see again Friday   Best practice:  Diet: Tube Feeds  DVT prophylaxis:  IVC filter 5/9 GI prophylaxis: Protonix Mobility: PT/ OT Code Status: FULL Disposition: I would keep him in the intensive care for 1 more day given his high FiO2 requirements Simonne Martinet ACNP-BC [facility] Pulmonary/Critical Care Pager # [phone] OR # [phone] if no answer

## 2019-07-22 NOTE — Progress Notes (Signed)
Pt transferred from 4N ICU,S/P STROKE, pt non verbal but eyes open with Trach in Situ, settled in room, tele monitor put and verified on pt, was however made comfortable in bed and will continue to monitor, safety measures initiated accordingly, v/s stable. Obasogie-Asidi, Avari Nevares Efe

## 2019-07-22 NOTE — Progress Notes (Signed)
Patient ID: Lee Hooper, male   DOB: Jul 21, 1970, 49 y.o.   MRN: [id number] BP 125/74   Pulse 87   Temp 98.2 F (36.8 C) (Axillary)   Resp (!) 26   Ht 6' (1.829 m)   Wt 98.5 kg   SpO2 95%   BMI 29.45 kg/m  Alert, not following commands Perrl, full eom Moving left side purposefully Wound now dry again, no signs of infection

## 2019-07-23 ENCOUNTER — Inpatient Hospital Stay (HOSPITAL_COMMUNITY): Payer: Medicaid Other

## 2019-07-23 DIAGNOSIS — L8990 Pressure ulcer of unspecified site, unspecified stage: Secondary | ICD-10-CM | POA: Insufficient documentation

## 2019-07-23 LAB — GLUCOSE, CAPILLARY
Glucose-Capillary: 115 mg/dL — ABNORMAL HIGH (ref 70–99)
Glucose-Capillary: 115 mg/dL — ABNORMAL HIGH (ref 70–99)
Glucose-Capillary: 121 mg/dL — ABNORMAL HIGH (ref 70–99)
Glucose-Capillary: 129 mg/dL — ABNORMAL HIGH (ref 70–99)
Glucose-Capillary: 134 mg/dL — ABNORMAL HIGH (ref 70–99)
Glucose-Capillary: 141 mg/dL — ABNORMAL HIGH (ref 70–99)

## 2019-07-23 LAB — BASIC METABOLIC PANEL
Anion gap: 9 (ref 5–15)
BUN: 30 mg/dL — ABNORMAL HIGH (ref 6–20)
CO2: 31 mmol/L (ref 22–32)
Calcium: 9.4 mg/dL (ref 8.9–10.3)
Chloride: 104 mmol/L (ref 98–111)
Creatinine, Ser: 0.82 mg/dL (ref 0.61–1.24)
GFR calc Af Amer: >60 mL/min (ref >60)
GFR calc non Af Amer: >60 mL/min (ref >60)
Glucose, Bld: 147 mg/dL — ABNORMAL HIGH (ref 70–99)
Potassium: 4.3 mmol/L (ref 3.5–5.1)
Sodium: 144 mmol/L (ref 135–145)

## 2019-07-23 LAB — HEPARIN LEVEL (UNFRACTIONATED): Heparin Unfractionated: 0.45 IU/mL (ref 0.30–0.70)

## 2019-07-23 LAB — CBC
HCT: 39.8 % (ref 39.0–52.0)
Hemoglobin: 12.4 g/dL — ABNORMAL LOW (ref 13.0–17.0)
MCH: 29.3 pg (ref 26.0–34.0)
MCHC: 31.2 g/dL (ref 30.0–36.0)
MCV: 94.1 fL (ref 80.0–100.0)
Platelets: 194 10*3/uL (ref 150–400)
RBC: 4.23 MIL/uL (ref 4.22–5.81)
RDW: 15.5 % (ref 11.5–15.5)
WBC: 12.5 10*3/uL — ABNORMAL HIGH (ref 4.0–10.5)
nRBC: 0.3 % — ABNORMAL HIGH (ref 0.0–0.2)

## 2019-07-23 MED ORDER — IPRATROPIUM-ALBUTEROL 0.5-2.5 (3) MG/3ML IN SOLN
3.0000 mL | Freq: Two times a day (BID) | RESPIRATORY_TRACT | Status: DC
  Administered 2019-07-23 – 2019-07-28 (×11): 3 mL via RESPIRATORY_TRACT
  Filled 2019-07-23 (×11): qty 3

## 2019-07-23 MED ORDER — HEPARIN (PORCINE) 25000 UT/250ML-% IV SOLN
1200.0000 [IU]/h | INTRAVENOUS | Status: AC
  Administered 2019-07-23: 1300 [IU]/h via INTRAVENOUS
  Administered 2019-07-24: 1150 [IU]/h via INTRAVENOUS
  Administered 2019-07-25: 1200 [IU]/h via INTRAVENOUS
  Filled 2019-07-23 (×3): qty 250

## 2019-07-23 MED ORDER — IPRATROPIUM-ALBUTEROL 0.5-2.5 (3) MG/3ML IN SOLN
3.0000 mL | Freq: Three times a day (TID) | RESPIRATORY_TRACT | Status: DC
  Filled 2019-07-23: qty 3

## 2019-07-23 NOTE — Progress Notes (Signed)
ANTICOAGULATION CONSULT NOTE - Initial Consult  Pharmacy Consult for Heparin Indication: Bilateral DVT in setting of IVC filter  No Known Allergies  Patient Measurements: Height: 6' (182.9 cm) Weight: 99 kg (218 lb 4.1 oz) IBW/kg (Calculated) : 77.6 Heparin Dosing Weight: 97.6 kg  Vital Signs: Temp: 98.2 F (36.8 C) (05/20 1115) Temp Source: Axillary (05/20 1115) BP: 114/79 (05/20 1115) Pulse Rate: 102 (05/20 1105)  Labs: Recent Labs    07/21/19 0454 07/21/19 0454 07/22/19 0343 07/23/19 0251  HGB 12.5*   < > 12.6* 12.4*  HCT 40.8  --  40.3 39.8  PLT 165  --  185 194  CREATININE 0.91  --  0.97 0.82   < > = values in this interval not displayed.    Estimated Creatinine Clearance: 131.4 mL/min (by C-G formula based on SCr of 0.82 mg/dL).   Medical History: History reviewed. No pertinent past medical history.  Assessment: 49 yo patient with large LMCA and ACA stroke d/t terminal left ICA occlusions s/p EVT but with reocclusion and malignant cerebral edema s/p left depressive hemicraniectomy. Patient with extensive bilateral DVT with possible PE per notes. MD would like to start heparin anticoagulation for DVT. Patient not on [facility] PTA. D/W Dr. Roda Shutters, will aim for lower heparin level goal and not use bolues.   Goal of Therapy:  Heparin level (0.3-0.5) Monitor platelets by anticoagulation protocol: Yes   Plan:  Heparin 1300 units/hr, no boluses Heparin level in 6 hours Daily Heparin level, CBC Monitor for bleeding   Halei Hanover A. Jeanella Craze, PharmD, BCPS, FNKF Clinical Pharmacist [place] Please utilize Amion for appropriate phone number to reach the unit pharmacist [facility])    Kai Levins 07/23/2019,2:19 PM

## 2019-07-23 NOTE — Progress Notes (Signed)
[place] protocol restarted at 1:00am; sign filled out and hung on patients door.

## 2019-07-23 NOTE — Progress Notes (Signed)
Physical Therapy Treatment Patient Details Name: Lee Hooper MRN: [id number] DOB: [birth year] Today's Date: 07/23/2019    History of Present Illness This 49 y.o. male admitted 4/28 with Rt sided hemiplegia and aphasia.  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  Repeat CT following procedure showed moderate basal ganglia hemorrhage vs. stroke   Repeat MRI revealed that the ICA and MCA re-occluded with change in FLAIR sequence excluding him from repeat thrombectomy.  He underwent a decompressive hemicraniectomy.  He waws intubated 4/28.  He developed an ileus 5/2. Trach 5/8. IVC filter placed 5/9.  Peg placement.  PMH includes: COPD, tobacco abuse, psoriasis    PT Comments    Pt very lethargic on arrival and did not start becoming incrementally more alert until he was sitting up.  Emphasis on transitions, sitting balance/truncal activation and standing trials at EOB with and without UE support.    Follow Up Recommendations  SNF     Equipment Recommendations  Wheelchair (measurements PT);Wheelchair cushion (measurements PT);Hospital bed    Recommendations for Other Services       Precautions / Restrictions Precautions Precautions: Fall Precaution Comments: R hemi, trach, coretrack; No Bone flap L head;crani    Mobility  Bed Mobility Overal bed mobility: Needs Assistance Bed Mobility: Rolling;Sidelying to Sit;Sit to Supine Rolling: Total assist;+2 for physical assistance Sidelying to sit: Total assist;+2 for physical assistance   Sit to supine: Total assist;+2 for physical assistance   General bed mobility comments: pt used L UE frequently, but not purposefully to help with transition.  Transfers Overall transfer level: Needs assistance Equipment used: None(chair back) Transfers: Sit to/from Stand Sit to Stand: Total assist;+2 physical assistance         General transfer comment: pt need significant 2 person assist for truncal support, coming forward  and boost with substantial knee blocking  Ambulation/Gait                 Stairs             Wheelchair Mobility    Modified Rankin (Stroke Patients Only) Modified Rankin (Stroke Patients Only) Pre-Morbid Rankin Score: No symptoms Modified Rankin: Severe disability     Balance Overall balance assessment: Needs assistance Sitting-balance support: Feet supported;No upper extremity supported;Single extremity supported Sitting balance-Leahy Scale: Poor Sitting balance - Comments: pt did not push today, but did reach for and hold to foot board, otherwise he listed R moderately Postural control: Posterior lean;Right lateral lean Standing balance support: During functional activity Standing balance-Leahy Scale: Zero                              Cognition Arousal/Alertness: Lethargic Behavior During Therapy: Flat affect;Impulsive Overall Cognitive Status: Impaired/Different from baseline                     Current Attention Level: Focused   Following Commands: Follows one step commands inconsistently Safety/Judgement: Decreased awareness of safety;Decreased awareness of deficits Awareness: Intellectual Problem Solving: Slow processing;Decreased initiation;Difficulty sequencing;Requires verbal cues;Requires tactile cues General Comments: short moments of focus      Exercises Other Exercises Other Exercises: Warm up ROM bil LE prior to mobility    General Comments General comments (skin integrity, edema, etc.): vss overall      Pertinent Vitals/Pain Pain Assessment: Faces Faces Pain Scale: No hurt Pain Intervention(s): Monitored during session    Home Living  Prior Function            PT Goals (current goals can now be found in the care plan section) Acute Rehab PT Goals Patient Stated Goal: none stated by patient PT Goal Formulation: Patient unable to participate in goal setting Time For Goal  Achievement: 08/04/19 Potential to Achieve Goals: Fair Progress towards PT goals: Progressing toward goals    Frequency    Min 2X/week      PT Plan Current plan remains appropriate    Co-evaluation              AM-PAC PT "6 Clicks" Mobility   Outcome Measure  Help needed turning from your back to your side while in a flat bed without using bedrails?: Total Help needed moving from lying on your back to sitting on the side of a flat bed without using bedrails?: Total Help needed moving to and from a bed to a chair (including a wheelchair)?: Total Help needed standing up from a chair using your arms (e.g., wheelchair or bedside chair)?: Total Help needed to walk in hospital room?: Total Help needed climbing 3-5 steps with a railing? : Total 6 Click Score: 6    End of Session Equipment Utilized During Treatment: Oxygen Activity Tolerance: Patient limited by fatigue;Other (comment)(limited arousal) Patient left: in bed;with call bell/phone within reach;with bed alarm set;with nursing/sitter in room Nurse Communication: Mobility status;Need for lift equipment PT Visit Diagnosis: Other abnormalities of gait and mobility (R26.89);Hemiplegia and hemiparesis Hemiplegia - Right/Left: Right Hemiplegia - dominant/non-dominant: Dominant Hemiplegia - caused by: Nontraumatic intracerebral hemorrhage     Time: 1352-1426 PT Time Calculation (min) (ACUTE ONLY): 34 min  Charges:  $Therapeutic Activity: 8-22 mins                     07/23/2019  Jacinto Halim., PT Acute Rehabilitation Services [phone]  (pager) [phone]  (office)   Eliseo Gum Mottinger 07/23/2019, 4:30 PM

## 2019-07-23 NOTE — Progress Notes (Signed)
STROKE TEAM PROGRESS NOTE   INTERVAL HISTORY Pt transferred to floor. Lying in bed with trach collar. Still has copious secretions, needing frequent suctioning. Has loose stool. No significant neuro change.    Vitals:   07/23/19 0800 07/23/19 0915 07/23/19 1105 07/23/19 1115  BP: 114/77 114/79  114/79  Pulse: (!) 103 (!) 104 (!) 102   Resp: (!) 22 20 (!) 26   Temp: 98.7 F (37.1 C) 98.2 F (36.8 C)  98.2 F (36.8 C)  TempSrc: Axillary Axillary  Axillary  SpO2: 97% 96% 94% 97%  Weight:      Height:       CBC:  Recent Labs  Lab 07/22/19 0343 07/23/19 0251  WBC 12.4* 12.5*  HGB 12.6* 12.4*  HCT 40.3 39.8  MCV 93.9 94.1  PLT 185 169   Basic Metabolic Panel:  Recent Labs  Lab 07/17/19 0528 07/18/19 0254 07/22/19 0343 07/23/19 0251  NA 150*   < > 145 144  K 3.8   < > 3.8 4.3  CL 118*   < > 105 104  CO2 25   < > 33* 31  GLUCOSE 116*   < > 154* 147*  BUN 35*   < > 35* 30*  CREATININE 1.06   < > 0.97 0.82  CALCIUM 9.5   < > 9.4 9.4  MG 2.5*  --   --   --   PHOS 3.2  --   --   --    < > = values in this interval not displayed.    IMAGING past 24 hours CT HEAD WO CONTRAST  Result Date: 07/23/2019 CLINICAL DATA:  Follow-up left hemispheric stroke. EXAM: CT HEAD WITHOUT CONTRAST TECHNIQUE: Contiguous axial images were obtained from the base of the skull through the vertex without intravenous contrast. COMPARISON:  07/09/2019 FINDINGS: Brain: Today's study suffers from considerable motion degradation but is sufficient for evaluation. No focal abnormality is seen affecting the brainstem or cerebellum. Since of infarction throughout the left hemisphere in the anterior and middle cerebral artery territories with low-density and swelling of the affected brain and areas of petechial blood products. No large focal hematoma. Extensive left-sided craniectomy with bulging of brain tissue through the defect. Because of this, reduction in left-to-right shift, now measuring only about 3  mm. No hydrocephalus or ventricular trapping. No extra-axial collection. Vascular: No significant vascular finding. Skull: Large left craniectomy as above. Sinuses/Orbits: Clear sinuses. No orbital abnormality seen. Extensively motion degraded. Other: None IMPRESSION: Continued low-density and swelling of the left hemisphere in the left ACA and MCA distributions with areas of petechial hemorrhage but no large hematoma. Extensive left craniectomy with bulging of the brain through that defect. Reduction in mass effect with left-to-right shift of 3 mm today compared with 5 mm previously. Electronically Signed   By: Nelson Chimes M.D.   On: 07/23/2019 08:44    PHYSICAL EXAM     General - mildly obese middle-aged Caucasian male, s/p tracheostomy on trach collar  Ophthalmologic - fundi not visualized due to noncooperation.  Cardiovascular - Regular rate and rhythm.  Neuro - s/p tracheostomy on trach collar. No sedation, eyes closed but open with voice. Still not following any simple commands. left gaze preference position, barely cross midline, not blinking to visual threat on the left, doll's eyes present, pupil left 25mm, right 4.37mm, both reactive to light. Corneal reflex weak on the right and brisk on the left, gag and cough present. Breathing over the vent.  right facial  droop.  Tongue protrusion not cooperative. Spontaneous and purposeful movement left UE 4/5 and at least LE 2+/5. However, right UE and LE plegic. DTR 1+ and no babinski. Sensation, coordination and gait not tested.    ASSESSMENT/PLAN [name] with history of COPD, ongoing tobacco use, and psoriasis, presenting with dense LMCA syndrome. Attempt for Left ICA and Left M1 thrombectomy was done emergently with TICI 2b flow. Unfortunately, the pt had post procedure hemorrhage and reocclusion of the vessels. F/u imaging suggested early edema and pt underwent prophylactic decompressive hemicraniectomy.  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  Stroke etiology unknown - given DVT requiring eventual AC and high mRS, will not do further embolic workup  Code Stroke CT head showed early hypodensity in nearly entire LMCA. ASPECTS 6-7 per notes.  CTA head & neck occlusion cervical ICA on the left. This is most likely due to dissection. The patient has minimal atherosclerotic disease.  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  There is poor collateral circulation on the left.   CTP CBF large core volume of 266 mL, perfusion volume 313 mL, mismatch volume 47 mL.  ASPECTS using CTP by Dr. Chestine Spore 6 or 7.   IR - TICI2c revascularization  MRI  Restricted diffusion throughout the majority of the left MCA and ACA vascular territories consistent with acute ischemia.  MRA - reocclusion of the intracranial left internal carotid artery. No flow related signal is seen within the left middle cerebral artery.  CT Head 07/04/19 - Slightly increased rightward midline shift with a small amount of herniation beneath the anterior falx. Otherwise unchanged examination.  CT repeat 5/6 stable infarcts and SAH but increasing edema w/ midline shift now 66mm and herniation through crani site  CT repeat decreased shift 5->21mm. Extensive L crani w/ buldging through defect. Still L ACA and MCA low density w/ petechial hemorrhage but no large hemorrhage.   2D Echo - EF 55 - 60%. No cardiac source of emboli identified.   LDL 45  HgbA1c 5.5  Subq heparin for VTE prophylaxis  No antithrombotic prior to admission, was on aspirin 81. Will start heparin IV for 2 days and if he tolerates well, will switch to  DOAC for DVT treatment.  Therapy recommendations:  SNF  Disposition:  Pending - LTACH consulted however no insurance  Respiratory failure s/p Trach Trach wound  Intubated on vent  CCM on board  CXR 07/03/19- New consolidation in the right lower lobe and left infrahilar region concerning for pneumonia. Possible small layering right effusion.  CXR 5/3 mild pulm edema w/ atx vs infiltrate/aspiration  On cefepime 4/30>>5/2 for pneumonia  Augmentin 5/2>>5/4  Unasyn 5/5>>5/6  Trach placed 5/8 per CCM  WOC consulted for trach wound ->progressed to stage 3 pressure, for dressing change q3d and prn  Fever and elevated white count and tachycardia hence restarted on Vanc & Cefepime 5/11>>5/13  Ancef 5/14>>5/17  (pneumonia)  On trach collar since 5/14 w/ high FiO2 requirements  On progressive floor  Cerebral edema Hypernatremia   MRI - Restricted diffusion throughout the majority of the left MCA and ACA vascular territories  consistent with acute ischemia.  CT Head 07/04/19 - Slightly increased rightward midline shift with a small amount of herniation beneath the anterior falx. Otherwise unchanged examination.  S/p decompressive left hemicraniectomy  4/28 Dr Franky Macho  CT 5/6 - stable infarcts and SAH but increasing edema w/ midline shift now 59mm and herniation through crani site  CT head 5/20 - decreased shift 5->79mm. Extensive L crani w/ buldging through defect. Still L ACA and MCA low density w/ petechial hemorrhage but no large hemorrhage.  completed keppra 2 wk course per NSG  Off 3% and D5NS  Okay to allow Na gradually normalize  Na 145->144  Crani sutures out -> suture incision leaking 5/16 - s/p local suture  Sepsis  Fever w/ leukocytosis HCAP  Afebrile  Leukocytosis - 13.2->12.4->12.5  UA neg  Sputum moraxella, H. flu   On cefepime 4/30>>5/2; Augmentin 5/2>>5/4, Unasyn 5/5>>5/6 -> off  Blood culture 5/2 no growth  Blood cultures 5/7 - no growth    Resp cultures 5/7 - no growth  CXR 5/9 - streaky BLL opacilites atx/consolication/PNA. Possible R pleural effusion  CXR 5/10 w/ atx vs infiltrate  CXR 07/15/19 - Stable bibasilar atelectasis a small right pleural effusion. No new opacity evident. Stable cardiac silhouette  Sputum cx 5/11 reincubated   Blood Cx 5/11 no growth   Vanc 5/11>>5/13, Cefepime 5/11>>5/13  Ancef 5/14>>5/17  (pneumonia)  CXR 5/18 slight improvement in bibasilar atx  Bilateral Lower Ext DVT, extensive Possible PE given tachycardia  Venous US 07/11/2019 DVT + bilateral extensive  IVC filter placed 5/9  On heparin subq for DVT prophylaxis  On aspirin 81 now; Will start heparin IV for 2 days and if he tolerates well, will switch to DOAC for DVT treatment.  Hypertension  Home meds:  None  Current meds: none scheduled  BP stable  SBP goal < 160 due to hemorrhagic conversion . Long-term BP goal normotensive  Dysphagia   NPO  Coretrak 5/7  PEG placement 5/14  On TF @ 55  FW 250 Q4h  Ileus, resolved  abd distension w/ decreased BS  KUB 5/2 distended colonic loop  Miralax   Flexiseal following laxatives  On reglan, laxatives on hold, decrease fentanyl  Coretrak 5/7  PEG 5/14  TF resumed after PEG  Acute Renal Failure    Renal US - increased cortical echogenicity in B kidneys c/w renal dz. Increased echogenicity of liver ?hepatic steatosis.  Urine studies - elevated protein creatinine ratio, Na normal, UN normal  Creatinine - 1.20->1.06->0.93->0.94->0.91->0.97->0.82  Diuresis - per CCM  Tobacco abuse  Current smoker  Smoking cessation counseling will be provided if able  Other Stroke Risk Factors  Obesity, Body mass index is 29.6 kg/m., recommend weight loss, diet and exercise as appropriate    Likely undiagnosed obstructive sleep apnea based on body habitus  Other Active Problems  Code Status - Full code  Hypokalemia - resolved   Thrombocytopenia - 194 -  resolved  Elevated LFT - AST/ALT 85/156 -> 54/102, improved    Constipation. Miralax daily -> loose stool -> miralax discontinued  Hospital day # 22   Marvel Plan, MD PhD Stroke Neurology 07/23/2019 12:35 PM   To contact Stroke Continuity provider, please refer to [email or web address]. After hours, contact General Neurology

## 2019-07-23 NOTE — Plan of Care (Signed)
  Problem: Nutrition: Goal: Risk of aspiration will decrease Outcome: Progressing   

## 2019-07-23 NOTE — Progress Notes (Signed)
Pt's wife Bee Hammerschmidt updated at 2353.

## 2019-07-23 NOTE — Progress Notes (Signed)
HR 94 and pulse 185 on monitor. per Dr. Arsenio Loader STAT EKG ordered.

## 2019-07-23 NOTE — Progress Notes (Addendum)
MEWS yellow d/t RR 24. Dr. Otelia Limes informed. After speaking with Asa Lente (PCCM) paged to inform them of MEWS.

## 2019-07-23 NOTE — Progress Notes (Signed)
eLink Physician-Brief Progress Note Patient Name: Lee Hooper DOB: 1970/10/09 MRN: [id number]   Date of Service  07/23/2019  HPI/Events of Note  Nursing reports elevated HR.  eICU Interventions  Will order: 1. 12 Lead EKG now.      Intervention Category Major Interventions: Other:  Lenell Antu 07/23/2019, 10:17 PM

## 2019-07-23 NOTE — Progress Notes (Signed)
ANTICOAGULATION CONSULT NOTE - Follow Up Consult  Pharmacy Consult for Heparin Indication: DVT, PE, CVA  Allergies  Allergen Reactions  . Tegaderm Ag Mesh [Silver] Other (See Comments)    blisters    Patient Measurements: Height: 6' (182.9 cm) Weight: 99 kg (218 lb 4.1 oz) IBW/kg (Calculated) : 77.6 Heparin Dosing Weight:    Vital Signs: Temp: 98.9 F (37.2 C) (05/20 2120) Temp Source: Oral (05/20 1948) BP: 122/75 (05/20 2129) Pulse Rate: 92 (05/20 2129)  Labs: Recent Labs    07/21/19 0454 07/21/19 0454 07/22/19 0343 07/23/19 0251 07/23/19 2058  HGB 12.5*   < > 12.6* 12.4*  --   HCT 40.8  --  40.3 39.8  --   PLT 165  --  185 194  --   HEPARINUNFRC  --   --   --   --  0.45  CREATININE 0.91  --  0.97 0.82  --    < > = values in this interval not displayed.    Estimated Creatinine Clearance: 131.4 mL/min (by C-G formula based on SCr of 0.82 mg/dL).   Assessment:  Anticoag: . DVT and possible PE -start heparin drip no boluses and lower goal on 5/20 with CVA + hemorrhage-- IVC filter placed 5/8 - PM Hl 0.45 in goal.  Goal of Therapy:  Heparin level 0.3-0.5 units/ml Monitor platelets by anticoagulation protocol: Yes   Plan:  Heparin 1300 units/hr , no boluses. Lower goal Daily HL and CBC   Conley Delisle S. Merilynn Finland, PharmD, BCPS Clinical Staff Pharmacist [email or web address] Merilynn Finland, [employer] 07/23/2019,9:53 PM

## 2019-07-24 LAB — BASIC METABOLIC PANEL
Anion gap: 10 (ref 5–15)
BUN: 28 mg/dL — ABNORMAL HIGH (ref 6–20)
CO2: 29 mmol/L (ref 22–32)
Calcium: 9.3 mg/dL (ref 8.9–10.3)
Chloride: 105 mmol/L (ref 98–111)
Creatinine, Ser: 0.86 mg/dL (ref 0.61–1.24)
GFR calc Af Amer: >60 mL/min (ref >60)
GFR calc non Af Amer: >60 mL/min (ref >60)
Glucose, Bld: 102 mg/dL — ABNORMAL HIGH (ref 70–99)
Potassium: 4.3 mmol/L (ref 3.5–5.1)
Sodium: 144 mmol/L (ref 135–145)

## 2019-07-24 LAB — CBC
HCT: 43.6 % (ref 39.0–52.0)
Hemoglobin: 13.2 g/dL (ref 13.0–17.0)
MCH: 29.5 pg (ref 26.0–34.0)
MCHC: 30.3 g/dL (ref 30.0–36.0)
MCV: 97.5 fL (ref 80.0–100.0)
Platelets: 171 10*3/uL (ref 150–400)
RBC: 4.47 MIL/uL (ref 4.22–5.81)
RDW: 15.7 % — ABNORMAL HIGH (ref 11.5–15.5)
WBC: 12 10*3/uL — ABNORMAL HIGH (ref 4.0–10.5)
nRBC: 0.2 % (ref 0.0–0.2)

## 2019-07-24 LAB — HEPARIN LEVEL (UNFRACTIONATED)
Heparin Unfractionated: 0.64 IU/mL (ref 0.30–0.70)
Heparin Unfractionated: 0.46 IU/mL (ref 0.30–0.70)

## 2019-07-24 LAB — GLUCOSE, CAPILLARY
Glucose-Capillary: 118 mg/dL — ABNORMAL HIGH (ref 70–99)
Glucose-Capillary: 128 mg/dL — ABNORMAL HIGH (ref 70–99)
Glucose-Capillary: 125 mg/dL — ABNORMAL HIGH (ref 70–99)
Glucose-Capillary: 126 mg/dL — ABNORMAL HIGH (ref 70–99)
Glucose-Capillary: 124 mg/dL — ABNORMAL HIGH (ref 70–99)
Glucose-Capillary: 119 mg/dL — ABNORMAL HIGH (ref 70–99)

## 2019-07-24 MED ORDER — FUROSEMIDE 10 MG/ML IJ SOLN
20.0000 mg | Freq: Once | INTRAMUSCULAR | Status: AC
  Administered 2019-07-24: 20 mg via INTRAVENOUS
  Filled 2019-07-24: qty 4

## 2019-07-24 NOTE — Progress Notes (Addendum)
STROKE TEAM PROGRESS NOTE   INTERVAL HISTORY No family at bedside on my rounding. He was lying in bed, trach secretion improved, not as much as before. Neuro unchanged. On heparin drip and tolerating well so far. I later called wife and gave her update.    Vitals:   07/24/19 0806 07/24/19 1000 07/24/19 1118 07/24/19 1205  BP: 122/79 122/69 115/76 124/73  Pulse: 86 86  82  Resp: (!) 25 (!) 22  (!) 23  Temp: 97.9 F (36.6 C) 98.2 F (36.8 C) 97.7 F (36.5 C) 98.6 F (37 C)  TempSrc: Axillary Axillary Axillary Axillary  SpO2: 100% 99%  96%  Weight:      Height:       CBC:  Recent Labs  Lab 07/23/19 0251 07/24/19 0341  WBC 12.5* 12.0*  HGB 12.4* 13.2  HCT 39.8 43.6  MCV 94.1 97.5  PLT 194 171   Basic Metabolic Panel:  Recent Labs  Lab 07/23/19 0251 07/24/19 0341  NA 144 144  K 4.3 4.3  CL 104 105  CO2 31 29  GLUCOSE 147* 102*  BUN 30* 28*  CREATININE 0.82 0.86  CALCIUM 9.4 9.3    IMAGING past 24 hours No results found.  PHYSICAL EXAM    General - mildly obese middle-aged Caucasian male, s/p tracheostomy on trach collar  Ophthalmologic - fundi not visualized due to noncooperation.  Cardiovascular - Regular rate and rhythm.  Neuro - s/p tracheostomy on trach collar. No sedation, eyes closed but open with voice. Still not following any simple commands. left gaze preference position, barely cross midline, not blinking to visual threat on the left, doll's eyes present, pupil left 62mm, right 4.73mm, both reactive to light. Corneal reflex weak on the right and brisk on the left, gag and cough present. Breathing over the vent.  right facial droop.  Tongue protrusion not cooperative. Spontaneous and purposeful movement left UE 4/5 and at least LE 2+/5. However, right UE and LE plegic. DTR 1+ and no babinski. Sensation, coordination and gait not tested.    ASSESSMENT/PLAN Lee Hooper is a 49 y.o. male with history of COPD, ongoing tobacco use, and psoriasis,  presenting with dense LMCA syndrome. Attempt for Left ICA and Left M1 thrombectomy was done emergently with TICI 2b flow. Unfortunately, the pt had post procedure hemorrhage and reocclusion of the vessels. F/u imaging suggested early edema and pt underwent prophylactic decompressive hemicraniectomy.  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  Stroke etiology unknown - given DVT requiring eventual AC and high mRS, will not do further embolic workup  Code Stroke CT head showed early hypodensity in nearly entire LMCA. ASPECTS 6-7 per notes.  CTA head & neck occlusion cervical ICA on the left. This is most likely due to dissection. The patient has minimal atherosclerotic disease.  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  There is poor collateral circulation on the left.   CTP CBF large core volume of 266 mL, perfusion volume 313 mL, mismatch volume 47 mL.  ASPECTS using CTP by Dr. Chestine Spore 6 or 7.   IR - TICI2c revascularization  MRI  Restricted diffusion throughout the majority  of the left MCA and ACA vascular territories consistent with acute ischemia.  MRA - reocclusion of the intracranial left internal carotid artery. No flow related signal is seen within the left middle cerebral artery.  CT Head 07/04/19 - Slightly increased rightward midline shift with a small amount of herniation beneath the anterior falx. Otherwise unchanged examination.  CT repeat 5/6 stable infarcts and SAH but increasing edema w/ midline shift now 77mm and herniation through crani site  CT repeat 5/21 decreased shift 5->51mm. Extensive L crani w/ buldging through defect. Still L ACA and MCA low  density w/ petechial hemorrhage but no large hemorrhage.   2D Echo - EF 55 - 60%. No cardiac source of emboli identified.   LDL 45  HgbA1c 5.5  Subq heparin for VTE prophylaxis  No antithrombotic prior to admission, was on aspirin 81. On heparin IV and tolerating well, will switch to DOAC for DVT treatment Sunday if no change.  Therapy recommendations:  SNF  Disposition:  Pending - LTACH consulted however no insurance  Respiratory failure s/p Trach Trach wound  Intubated on vent  CCM on board  CXR 07/03/19- New consolidation in the right lower lobe and left infrahilar region concerning for pneumonia. Possible small layering right effusion.  CXR 5/3 mild pulm edema w/ atx vs infiltrate/aspiration  On cefepime 4/30>>5/2 for pneumonia  Augmentin 5/2>>5/4  Unasyn 5/5>>5/6  Trach placed 5/8 per CCM  WOC consulted for trach wound ->progressed to stage 3 pressure, for dressing change q3d and prn  Fever and elevated white count and tachycardia hence restarted on Vanc & Cefepime 5/11>>5/13  Ancef 5/14>>5/17  (pneumonia)  On trach collar since 5/14 w/ high FiO2 requirements  On progressive floor  Cerebral edema Hypernatremia   MRI - Restricted diffusion throughout the majority of the left MCA and ACA vascular territories consistent with acute ischemia.  CT Head 07/04/19 - Slightly increased rightward midline shift with a small amount of herniation beneath the anterior falx. Otherwise unchanged examination.  S/p decompressive left hemicraniectomy  4/28 Dr Franky Macho  CT 5/6 - stable infarcts and SAH but increasing edema w/ midline shift now 2mm and herniation through crani site  CT head 5/20 - decreased shift 5->38mm. Extensive L crani w/ buldging through defect. Still L ACA and MCA low density w/ petechial hemorrhage but no large hemorrhage.  completed keppra 2 wk course per NSG  Off 3% and D5NS  Okay to allow Na gradually normalize  Na 145->144  Crani sutures out ->  suture incision leaking 5/16 - s/p local suture  Sepsis, resolved Fever w/ leukocytosis, resolved HCAP, resolved  Afebrile  Leukocytosis - 13.2->12.4->12.5->12.0  UA neg  Sputum moraxella, H. flu   On cefepime 4/30>>5/2; Augmentin 5/2>>5/4, Unasyn 5/5>>5/6 -> off  Blood culture 5/2 no growth  Blood cultures 5/7 - no growth   Resp cultures 5/7 - no growth  CXR 5/9 - streaky BLL opacilites atx/consolication/PNA. Possible R pleural effusion  CXR 5/10 w/ atx vs infiltrate  CXR 07/15/19 - Stable bibasilar atelectasis a small right pleural effusion. No new opacity evident. Stable cardiac silhouette  Sputum cx 5/11 reincubated   Blood Cx 5/11 no growth   Vanc 5/11>>5/13, Cefepime 5/11>>5/13  Ancef 5/14>>5/17  (pneumonia)  CXR 5/18 slight improvement in bibasilar atx  Bilateral Lower Ext DVT, extensive Possible PE given tachycardia  Venous US 07/11/2019 DVT + bilateral extensive  IVC filter placed 5/9  On heparin IV now for DVT prophylaxis  On heparin IV now and so  far tolerates well, will switch to DOAC for DVT treatment on Sunday.  Hypertension  Home meds:  None  Current meds: none scheduled  BP stable  SBP goal < 160 due to hemorrhagic conversion . Long-term BP goal normotensive  Dysphagia   NPO  Coretrak 5/7  PEG placement 5/14  On TF @ 84  FW 250 Q4h  Ileus, resolved  abd distension w/ decreased BS  KUB 5/2 distended colonic loop  Miralax   Flexiseal following laxatives  On reglan, laxatives on hold, decrease fentanyl  Coretrak 5/7  PEG 5/14  TF resumed after PEG  Acute Renal Failure    Renal US - increased cortical echogenicity in B kidneys c/w renal dz. Increased echogenicity of liver ?hepatic steatosis.  Urine studies - elevated protein creatinine ratio, Na normal, UN normal  Creatinine - 1.20->1.06->0.93->0.94->0.91->0.97->0.82->0.86  Diuresis - per CCM  Tobacco abuse  Current smoker  Smoking cessation counseling  will be provided if able  Other Stroke Risk Factors  Obesity, Body mass index is 29.24 kg/m., recommend weight loss, diet and exercise as appropriate    Likely undiagnosed obstructive sleep apnea based on body habitus  Other Active Problems  Code Status - Full code  Hypokalemia - resolved   Thrombocytopenia - 194 - resolved  Elevated LFT - AST/ALT 85/156 -> 54/102, improved    Constipation. Miralax daily -> loose stool -> miralax discontinued  Hospital day # 23  I had long discussion with wife over the phone, updated pt current condition, treatment plan and potential prognosis, and answered all the questions. She expressed understanding and appreciation.    Rosalin Hawking, MD PhD Stroke Neurology 07/24/2019 2:14 PM   To contact Stroke Continuity provider, please refer to [email or web address] After hours, contact General Neurology

## 2019-07-24 NOTE — Progress Notes (Signed)
Pt's MEWS yellow d/t elevated RR. This MEWS score d/t elevated RR is not new. Will conti Q4 vs.

## 2019-07-24 NOTE — Plan of Care (Signed)

## 2019-07-24 NOTE — Progress Notes (Signed)
NAME:  Lee Hooper, MRN:  [id number], DOB:  [birth year], LOS: 72 ADMISSION DATE:  07/01/2019, CONSULTATION DATE:  07/01/19 REFERRING MD:  Aroor MD, CHIEF COMPLAINT:  CVA    Brief History   Lee Hooper is a 49 yo M who presented as a code stroke after being found aphasic and nonverbal by his wife. CT/CTA studies showed left ICA/MCA thrombosis and pt was taken for IR thrombectomy for left ICA and left M1 occlusions. Repeat head CT was obtained following the procedure out of concern for possible hemorrhage and showed moderate basal ganglia hemorrhage vs stroke. Repeat MRI revealed that the ICA and MCA re-occluded with change in FLAIR sequence excluding him from repeat thrombectomy. He underwent a L hemicraniectomy. He was intubated for airway protection.  Past Medical History  Psoriasis  COPD  Tobacco Use  Significant Hospital Events   4/28: Admit; L ICA/MCA Thrombectomy with re-occlusion --> left hemicraniectomy, intubated for acute hypoxic respiratory failure  5/3: NAE. Was taken off precedex yesterday and pt opened his eyes. Prednisone 50 mg qd was begun. He had a persistent fever around 38.2, HR 110s, BP 140s/60s. Pt required increased O2 with secretions; CXR showed mild edema with small effusions c/f aspiration vs opacification. Blood culture x2 and urine culture collected, NGTD. ABG pending. Currently satting 92% on PRVC FiO2 80, PEEP 10, Plateau 22. WBC declined to 10.5 from 11.1, Na 151 from 156. Tube feeds were held d/t distended colonic loop in RLQ on KUB, to be continued today. Thrombocytopenia improved to 93 from 81.  5/4: Patient remains intubated with spontaneous movement of left arm and leg.  5/5: NAE. Pt remains intubated on PRN fetanyl. Will open eyes and spontaneously move left leg and arm.  5/6: NAE overnight. Intubated with no sedation. 5/7: NAE. Marland Kitchen Completed Unasyn yesterday. VSS, spO2 remains in low 90s the majority of the time on 620/40%/+5. Morning labs significant  for HCO3 of 19. Otherwise unremarkable.  5/8: Ongoing fever.  Obtained ultrasound, positive for lower extremity DVT bilaterally.  Tracheostomy placed at bedside.,  Did have some SVT this responded to treating fever and rate control. 5/9: IVC filter placed 5/11: no acute events overnight. Pt with similar exam as previous however tachycardic and febrile (tmax102.7) with a bit increased wbc and new R infiltrate noted in base. Pan cx and start empiric abx. 5/12 Tolerated 8 hours of trach collar  5/17 completed antibiotics, added IV Lasix, for ongoing high supplemental oxygen requirements.  Because of this we delayed transfer out 5/18 improved oxygenation, improved x-ray.  Weaning oxygen.  Transitioning to aerosol trach collar Consults:  IR PCCM  Neuro (Primary) Neurosurgery  Procedures:  4/28 IR - L ICA/MCA Thrombectomy 4/28 L hemicraniectomy 4/28 Intubation 5/08 Trach   Significant Diagnostic Tests:  4/28 Admit CT Head Code Stroke:  Hyperdense left ICA and MCA compatible with acute thrombus. No acute infarct or hemorrhage.  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 The patient has minimal atherosclerotic disease. The left internal carotid artery is occluded through the terminus and extending in the left M1 M2 and M3 branches. There is poor collateral circulation on the left. There is a large territory infarct involving the left hemisphere. Infarct volume 266 mL.  4/28 Admit MR Brain:  Restricted diffusion throughout much of the left MCA vascular territory consistent with acute ischemia. Restricted diffusion consistent with acute ischemia is also present within the paramedian  left frontoparietal lobes, although this is less well assessed due to the degree of motion degradation at the level of the vertex. There is little if any corresponding T2/FLAIR hyperintensity at these sites. No significant  mass effect. No midline shift.  4/28 Post Thrombectomy CT Head: : 1. 3.0 x 2.6 x 3.9 cm region of hyperdensity centered within the left basal ganglia, left subinsular region and inferomedial left temporal lobe likely reflecting a combination of parenchymal hematoma and contrast staining.  2. Edema with loss of gray-white differentiation within the left basal ganglia, left insula and anterior left temporal lobe, likely acute infarction. Subtle changes of acute infarction are also suspected within the paramedian left frontal lobe ACA vascular territory. 3. Scattered small-volume subarachnoid hemorrhage along the left cerebral hemisphere. 4. Regional mass effect with effacement of the left lateral ventricle temporal horn. No midline shift.  4/28 Post Thrombectomy MR Brain:  1. Restricted diffusion throughout the majority of the left MCA and ACA vascular territories consistent with acute ischemia. 2. No significant mass effect at this time. No midline shift. 3. The acute parenchymal hemorrhage and/or contrast staining centered within left basal ganglia and inferomedial left temporal lobe on prior head CT does not appear significantly changed. Based on the MR appearance, it is suspected that a significant component of this previously demonstrated hyperdensity reflects contrast staining. 4. Redemonstrated small volume subarachnoid hemorrhage overlying the left cerebral hemisphere. Small volume subarachnoid hemorrhage is also questioned along the right cerebral hemisphere posteriorly.  4/29 Post Hemicraniectomy CT head:  Complete left ACA/MCA territory infarction with swollen brain bulging through craniectomy defect. No midline shift or entrapment. Petechial hemorrhage at left basal ganglia. Extraaxial hemorrhage along surface of the infarct.   5/1 Head CT > Slightly increased rightward midline shift with a small amount of herniation beneath the anterior falx. Otherwise  unchanged Examination.  5/6 Head CT: Left ACA and MCA territory infarct with unchanged degree of subarachnoid hemorrhage. Swelling has progressed with greater herniation through the craniectomy defect and 5 mm of midline shift.  5/8: Bilateral acute DVT involving the right common femoral vein right proximal profundal vein and right popliteal vein also left DVT involving same vessels  5/12 Renal US: Technically challenging exam due to patient's intubated status and difficulty with repositioning.  Diffusely increased cortical echogenicity in both kidneys compatible with medical renal disease.  Increased echogenicity of the liver with diminished through transmission, most often reflective of hepatic steatosis.  5/20 Ct H> significant motion artifact. Continued low density and swelling of L hemisphere in L ACA and MCA idstributions. Areas of petechial hemorrhage, no large hematoma. L craniectomy w bulging of brain through defect. Reduced mass effect with L to R shift now 46mm from 53mm previously  Micro Data:  4/28: SARS-CoV-2/Influenza A/Influenza B PCR: Negative  4/30: Respiratory culture: Abundant Moraxella Catarrhalis, Haemophilus Influenzae, beta lactamase positive  5/2: Urine culture: NGTD  5/2: BC x2: NGTD 5/7 resp >> H flu 5/7 bld >> negative 5/11 BCx2 >> negative 5/11 trach asp >> E coli  Antimicrobials:  4/30 - 5/2: Cefepime 5/2 - 5/4: Augmentin  5/5 - 5/6 unasyn   Vanc 5/11 >> 5/13 Cefepime 5/11 >> 5/13 Ancef 5/14 >> 5/17  Interim history/subjective:  FiO2 weaned from 60 to 40%  Moving LUE LLE spontaneously   Objective   Blood pressure 115/76, pulse 86, temperature 97.7 F (36.5 C), temperature source Axillary, resp. rate (!) 22, height 6' (1.829 m), weight 97.8 kg, SpO2 99 %.    FiO2 (%):  [  40 %-60 %] 40 %   Intake/Output Summary (Last 24 hours) at 07/24/2019 1137 Last data filed at 07/24/2019 0350 Gross per 24 hour  Intake 3263.55 ml  Output 1100 ml  Net 2163.55 ml    Filed Weights   07/22/19 2219 07/23/19 0135 07/24/19 0329  Weight: 99.1 kg 99 kg 97.8 kg    Examination: General: Middle aged M reclined in bed moving L side spontaneously, NAD  HEENT: well approximated healing L cranial incision site. 6 cuffed trach. Pink mm Pulm: 40% ATC. Even, unlabored respiratory effort. Symmetrical chest excursion. Scattered bilateral rhonchi.  CV. RRR s1s2 no rgm. Cap refill < 3 seconds  GI: soft round ndnt. EN  Neuro: non-verbal. Spontaneous purposeful movement on L  GU: clear straw colored urine  Ext- non-pitting BLE edema. Flank edema. Bilateral mittens   Resolved Hospital Problem list   treated HCAP with Moraxella and Haemophilus influenza, these were beta-lactamase positive, antibiotics were completed 5/6; COPD exacerbation. Ileus, AKI, hypernatremia Completed E. coli HCAP treatment on 5/18 Assessment & Plan:   Acute hypoxic respiratory failure with compromised airway. Failure to wean s/p tracheostomy. Hx of smoking with presumed COPD. Large Lt ICA/MCA CVA s/p thrombectomy and decompressive hemicraniectomy 2nd to cerebral edema and hemorrhage. Mild hypernatremia-->improved w/ Free water replacement  B/l lower extremity DVT.- s/p IVC filter 5/09, on heparin gtt and primary intends to switch to DOAC SVT.  Discussion  Now on 40 % FIO2. Continuing to tolerate ATC well Continues to be net positive  Plan Cont ATC  Wean for sats > 90% Routine trach care, pulm hygiene  Will give 1x lasix 5/21  PRN CXR    Best practice:  Diet: EN  DVT prophylaxis:  IVC filter 5/9, on hep gtt GI prophylaxis: Protonix Mobility: PT/ OT Code Status: FULL Disposition: SDU   Tessie Fass MSN, AGACNP-BC [facility] [phone] If no answer, [phone] 07/24/2019, 11:37 AM

## 2019-07-24 NOTE — Progress Notes (Signed)
Nutrition Follow-up  DOCUMENTATION CODES:   Obesity unspecified  INTERVENTION:  Continue TF via PEG: -Osmolite 1.5 @ 69m/hr (13231mper day) -6075mro-stat BID -250m40mee water Q4H (per MD)  Tube feeding regimen will provide 2380 kcals, 142g of protein, 1005ml75me water (2505ml 74ml with free water flushes)  NUTRITION DIAGNOSIS:   Inadequate oral intake related to inability to eat as evidenced by NPO status.  Ongoing.  GOAL:   Patient will meet greater than or equal to 90% of their needs  Met with TF.   MONITOR:   TF tolerance  REASON FOR ASSESSMENT:   Consult, Ventilator Enteral/tube feeding initiation and management  ASSESSMENT:   Pt with PMH of smoking and COPD admitted with L ICA/MCA s/p IR for revascularization however pt had re-occlusion now s/p emergent L hemicraniectomy.  4/30 cortrak placed; tip gastric 5/2 TF held due to ileus 5/6 trickle TF started 5/8 trach placed; IVC filter placed  5/14 PEG placed   Pt sleeping at time of RD visit.   UOP: 1,300ml x25mours I/O: +16,040.2ml [id number]  Labs: [phone] reviewed and include: MVI  TF via PEG: Osmolite 1.5 @ 55ml/hr 83m0ml per 70m, 60ml Pro-s34mBID, 250ml free w21m Q4H (per MD)  Diet Order:   Diet Order            Diet NPO time specified  Diet effective midnight              EDUCATION NEEDS:   No education needs have been identified at this time  Skin:  Skin Assessment: Skin Integrity Issues: Skin Integrity Issues:: Stage III, Incisions Stage III: R neck Incisions: head, R neck  Last BM:  5/20 type 7  Height:   Ht Readings from Last 1 Encounters:  07/22/19 6' (1.829 m)    Weight:   Wt Readings from Last 1 Encounters:  07/24/19 97.8 kg    Ideal Body Weight:  80.9 kg  BMI:  Body mass index is 29.24 kg/m.  Estimated Nutritional Needs:   Kcal:  2300-2500  [id number] grams  Fluid:  2 L/day   Amanda AvereLarkin InaN RD pager  number and weekend/on-call pager number located in [email or web address]

## 2019-07-24 NOTE — Progress Notes (Signed)
Pt's wife updated at 67.

## 2019-07-24 NOTE — Progress Notes (Addendum)
ANTICOAGULATION CONSULT NOTE - Follow Up Consult  Pharmacy Consult for Heparin Indication: bilateral DVT, possible PE, CVA  Allergies  Allergen Reactions  . Tegaderm Ag Mesh [Silver] Other (See Comments)    blisters    Patient Measurements: Height: 6' (182.9 cm) Weight: 97.8 kg (215 lb 9.8 oz) IBW/kg (Calculated) : 77.6 Heparin Dosing Weight: 97.8 kg  Vital Signs: Temp: 97.9 F (36.6 C) (05/21 0806) Temp Source: Axillary (05/21 0806) BP: 122/79 (05/21 0806) Pulse Rate: 86 (05/21 0806)  Labs: Recent Labs    07/22/19 0343 07/22/19 0343 07/23/19 0251 07/23/19 2058 07/24/19 0341  HGB 12.6*   < > 12.4*  --  13.2  HCT 40.3  --  39.8  --  43.6  PLT 185  --  194  --  171  HEPARINUNFRC  --   --   --  0.45 0.64  CREATININE 0.97  --  0.82  --  0.86   < > = values in this interval not displayed.    Estimated Creatinine Clearance: 124.6 mL/min (by C-G formula based on SCr of 0.86 mg/dL).  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 Patient with extensive bilateral DVT per duplex 5/8 and possible PE per notes. S/p IVC filter on 5/9. MD would like to start heparin anticoagulation for DVT. Patient not on [facility] PTA.Prior pharmacist d/w Dr. Roda Shutters, will aim for lower heparin level goal and not use bolues. Was on SQ heparin 5/17>>5/20.    Initial heparin level 0.45 on 1300 units/hr but up to 0.64 this am.  Now upper-range therapeutic, goal is lower-range therapeutic.       ASA 81 mg daily stopped after 5/20 dose.  Noted plan for IV heparin for 2 days then switch to DOAC if tolerated.  Goal of Therapy:  Heparin level 0.3-0.5 units/ml Monitor platelets by anticoagulation protocol: Yes   Plan:   Decrease heparin drip to 1150 units/hr  Heparin level ~6 hr after rate change.  Daily heparin level and CBC.  Dennie Fetters, RPh Phone:  [phone] 07/24/2019,8:42 AM   Addendum;  Heparin level this afternoon is 0.46 on 1150 units/hr.  Now at low therapeutic goal.  Next labs in am.  Hilarie Fredrickson, RPh 07/24/2019 3:43 PM

## 2019-07-25 LAB — BASIC METABOLIC PANEL
Anion gap: 10 (ref 5–15)
BUN: 25 mg/dL — ABNORMAL HIGH (ref 6–20)
CO2: 31 mmol/L (ref 22–32)
Calcium: 9 mg/dL (ref 8.9–10.3)
Chloride: 98 mmol/L (ref 98–111)
Creatinine, Ser: 0.86 mg/dL (ref 0.61–1.24)
GFR calc Af Amer: >60 mL/min (ref >60)
GFR calc non Af Amer: >60 mL/min (ref >60)
Glucose, Bld: 121 mg/dL — ABNORMAL HIGH (ref 70–99)
Potassium: 3.7 mmol/L (ref 3.5–5.1)
Sodium: 139 mmol/L (ref 135–145)

## 2019-07-25 LAB — GLUCOSE, CAPILLARY
Glucose-Capillary: 103 mg/dL — ABNORMAL HIGH (ref 70–99)
Glucose-Capillary: 115 mg/dL — ABNORMAL HIGH (ref 70–99)
Glucose-Capillary: 120 mg/dL — ABNORMAL HIGH (ref 70–99)
Glucose-Capillary: 121 mg/dL — ABNORMAL HIGH (ref 70–99)
Glucose-Capillary: 124 mg/dL — ABNORMAL HIGH (ref 70–99)
Glucose-Capillary: 131 mg/dL — ABNORMAL HIGH (ref 70–99)

## 2019-07-25 LAB — CBC
HCT: 37.8 % — ABNORMAL LOW (ref 39.0–52.0)
Hemoglobin: 11.5 g/dL — ABNORMAL LOW (ref 13.0–17.0)
MCH: 29.2 pg (ref 26.0–34.0)
MCHC: 30.4 g/dL (ref 30.0–36.0)
MCV: 95.9 fL (ref 80.0–100.0)
Platelets: 195 10*3/uL (ref 150–400)
RBC: 3.94 MIL/uL — ABNORMAL LOW (ref 4.22–5.81)
RDW: 15.4 % (ref 11.5–15.5)
WBC: 11.5 10*3/uL — ABNORMAL HIGH (ref 4.0–10.5)
nRBC: 0.4 % — ABNORMAL HIGH (ref 0.0–0.2)

## 2019-07-25 LAB — HEPARIN LEVEL (UNFRACTIONATED): Heparin Unfractionated: 0.31 IU/mL (ref 0.30–0.70)

## 2019-07-25 MED ORDER — FREE WATER
200.0000 mL | Freq: Four times a day (QID) | Status: DC
  Administered 2019-07-26 – 2019-07-27 (×6): 200 mL

## 2019-07-25 MED ORDER — APIXABAN 5 MG PO TABS
5.0000 mg | ORAL_TABLET | Freq: Two times a day (BID) | ORAL | Status: DC
  Filled 2019-07-25: qty 1

## 2019-07-25 NOTE — Progress Notes (Signed)
ANTICOAGULATION CONSULT NOTE - Follow Up Consult  Pharmacy Consult for Heparin Indication: bilateral DVT, possible PE, CVA  Allergies  Allergen Reactions  . Tegaderm Ag Mesh [Silver] Other (See Comments)    blisters    Patient Measurements: Height: 6' (182.9 cm) Weight: 98.3 kg (216 lb 11.4 oz) IBW/kg (Calculated) : 77.6 Heparin Dosing Weight: 97.8 kg  Vital Signs: Temp: 98.8 F (37.1 C) (05/22 0329) Temp Source: Oral (05/22 0329) BP: 117/64 (05/22 0329) Pulse Rate: 84 (05/22 0742)  Labs: Recent Labs    07/23/19 0251 07/23/19 2058 07/24/19 0341 07/24/19 1453 07/25/19 0259  HGB 12.4*  --  13.2  --  11.5*  HCT 39.8  --  43.6  --  37.8*  PLT 194  --  171  --  195  HEPARINUNFRC  --    < > 0.64 0.46 0.31  CREATININE 0.82  --  0.86  --  0.86   < > = values in this interval not displayed.    Estimated Creatinine Clearance: 124.9 mL/min (by C-G formula based on SCr of 0.86 mg/dL).  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 Patient with extensive bilateral DVT per duplex 5/8 and possible PE per notes. S/p IVC filter on 5/9. Started on heparin anticoagulation for DVT 5/20. Patient not on [facility] PTA. Prior Dr. Roda Shutters, aim for lower heparin level goal and no bolues. Was on SQ heparin 5/17>>5/20.  ASA 81 mg daily stopped after 5/20 dose.  Noted plan for IV heparin for 2 days then switch to DOAC 5/23 if stable.  HL down to 0.31, just therapeutic after rate decrease. Per RN, no issues with infusion, was not held for anything, no bleeding. H/H dropped but within range in the last week.   Goal of Therapy:  Heparin level 0.3-0.5 units/ml Monitor platelets by anticoagulation protocol: Yes   Plan:  Increase heparin drip to 1200 units/hr Monitor daily HL, CBC/plt Monitor for signs/symptoms of bleeding  F/u switch to DOAC 5/23 if stable    Alphia Moh, PharmD, BCPS, [facility] Clinical Pharmacist  Please check AMION for all [facility] Pharmacy phone numbers After 10:00 PM, call Main Pharmacy [phone]

## 2019-07-25 NOTE — Plan of Care (Signed)
  Problem: Education: Goal: Knowledge of disease or condition will improve Outcome: Progressing   Problem: Safety: Goal: Ability to remain free from injury will improve Outcome: Progressing   

## 2019-07-25 NOTE — Progress Notes (Signed)
STROKE TEAM PROGRESS NOTE   INTERVAL HISTORY RN at bedside. Pt lying in bed, neuro stable, unchanged. NT suctioning intermittent, but decreased secretion than before. Tolerating heparin IV well. Will consider to change to eliquis tomorrow.   Vitals:   07/25/19 0911 07/25/19 1117 07/25/19 1202 07/25/19 1213  BP: 109/77 114/74 108/72 110/70  Pulse: 94  98   Resp:   (!) 28   Temp:   98.3 F (36.8 C) 98.5 F (36.9 C)  TempSrc:   Axillary   SpO2:      Weight:      Height:       CBC:  Recent Labs  Lab 07/24/19 0341 07/25/19 0259  WBC 12.0* 11.5*  HGB 13.2 11.5*  HCT 43.6 37.8*  MCV 97.5 95.9  PLT 171 195   Basic Metabolic Panel:  Recent Labs  Lab 07/24/19 0341 07/25/19 0259  NA 144 139  K 4.3 3.7  CL 105 98  CO2 29 31  GLUCOSE 102* 121*  BUN 28* 25*  CREATININE 0.86 0.86  CALCIUM 9.3 9.0    IMAGING past 24 hours No results found.  PHYSICAL EXAM    General - mildly obese middle-aged Caucasian male, s/p tracheostomy on trach collar  Ophthalmologic - fundi not visualized due to noncooperation.  Cardiovascular - Regular rate and rhythm.  Neuro - s/p tracheostomy on trach collar. No sedation, eyes closed but open with voice. Still not following any simple commands. left gaze preference position, barely cross midline, not blinking to visual threat on the left, doll's eyes present, pupil left 25mm, right 4.61mm, both reactive to light. Corneal reflex weak on the right and brisk on the left, gag and cough present. Breathing over the vent.  right facial droop.  Tongue protrusion not cooperative. Spontaneous and purposeful movement left UE 4/5 and at least LE 2+/5. However, right UE and LE plegic. DTR 1+ and no babinski. Sensation, coordination and gait not tested.    ASSESSMENT/PLAN Mr. ORLANDER NORWOOD is a 49 y.o. male with history of COPD, ongoing tobacco use, and psoriasis, presenting with dense LMCA syndrome. Attempt for Left ICA and Left M1 thrombectomy was done  emergently with TICI 2b flow. Unfortunately, the pt had post procedure hemorrhage and reocclusion of the vessels. F/u imaging suggested early edema and pt underwent prophylactic decompressive hemicraniectomy.  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  Stroke etiology unknown - given DVT requiring eventual AC and high mRS, will not do further embolic workup  Code Stroke CT head showed early hypodensity in nearly entire LMCA. ASPECTS 6-7 per notes.  CTA head & neck occlusion cervical ICA on the left. This is most likely due to dissection. The patient has minimal atherosclerotic disease.  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  There is poor collateral circulation on the left.   CTP CBF large core volume of 266 mL, perfusion volume 313 mL, mismatch volume 47 mL.  ASPECTS using CTP by Dr. Chestine Spore 6 or 7.   IR - TICI2c revascularization  MRI  Restricted diffusion throughout the majority of the left MCA and ACA vascular territories consistent with acute ischemia.  MRA - reocclusion of the  intracranial left internal carotid artery. No flow related signal is seen within the left middle cerebral artery.  CT Head 07/04/19 - Slightly increased rightward midline shift with a small amount of herniation beneath the anterior falx. Otherwise unchanged examination.  CT repeat 5/6 stable infarcts and SAH but increasing edema w/ midline shift now 51mm and herniation through crani site  CT repeat 5/21 decreased shift 5->75mm. Extensive L crani w/ buldging through defect. Still L ACA and MCA low density w/ petechial hemorrhage but no large hemorrhage.   2D Echo - EF 55 - 60%. No cardiac  source of emboli identified.   LDL 45  HgbA1c 5.5  Subq heparin for VTE prophylaxis  No antithrombotic prior to admission, was on aspirin 81. On heparin IV and tolerating well, will switch to [facility] tomorrow.  Therapy recommendations:  SNF  Disposition:  Pending - LTACH consulted however no insurance  Respiratory failure s/p Trach Trach wound  Intubated on vent  CCM on board  CXR 07/03/19- New consolidation in the right lower lobe and left infrahilar region concerning for pneumonia. Possible small layering right effusion.  CXR 5/3 mild pulm edema w/ atx vs infiltrate/aspiration  On cefepime 4/30>>5/2 for pneumonia  Augmentin 5/2>>5/4  Unasyn 5/5>>5/6  Trach placed 5/8 per CCM  WOC consulted for trach wound ->progressed to stage 3 pressure, for dressing change q3d and prn  Fever and elevated white count and tachycardia hence restarted on Vanc & Cefepime 5/11>>5/13  Ancef 5/14>>5/17  (pneumonia)  On trach collar since 5/14 w/ high FiO2 requirements  On progressive floor  Cerebral edema Hypernatremia   MRI - Restricted diffusion throughout the majority of the left MCA and ACA vascular territories consistent with acute ischemia.  CT Head 07/04/19 - Slightly increased rightward midline shift with a small amount of herniation beneath the anterior falx. Otherwise unchanged examination.  S/p decompressive left hemicraniectomy  4/28 Dr Franky Macho  CT 5/6 - stable infarcts and SAH but increasing edema w/ midline shift now 45mm and herniation through crani site  CT head 5/20 - decreased shift 5->79mm. Extensive L crani w/ buldging through defect. Still L ACA and MCA low density w/ petechial hemorrhage but no large hemorrhage.  completed keppra 2 wk course per NSG  Off 3% and D5NS  Okay to allow Na gradually normalize  Na 145->144->139  Crani sutures out -> suture incision leaking 5/16 - s/p local suture  Sepsis, resolved Fever w/ leukocytosis, resolved HCAP,  resolved  Afebrile  Leukocytosis - 13.2->12.4->12.5->12.0->11.5  UA neg  Sputum moraxella, H. flu   On cefepime 4/30>>5/2; Augmentin 5/2>>5/4, Unasyn 5/5>>5/6 -> off  Blood culture 5/2 no growth  Blood cultures 5/7 - no growth   Resp cultures 5/7 - no growth  CXR 5/9 - streaky BLL opacilites atx/consolication/PNA. Possible R pleural effusion  CXR 5/10 w/ atx vs infiltrate  CXR 07/15/19 - Stable bibasilar atelectasis a small right pleural effusion. No new opacity evident. Stable cardiac silhouette  Sputum cx 5/11 reincubated   Blood Cx 5/11 no growth   Vanc 5/11>>5/13, Cefepime 5/11>>5/13  Ancef 5/14>>5/17  (pneumonia)  CXR 5/18 slight improvement in bibasilar atx  Bilateral Lower Ext DVT, extensive Possible PE given tachycardia  Venous US 07/11/2019 DVT + bilateral extensive  IVC filter placed 5/9  On heparin IV now for DVT prophylaxis  On heparin IV now and so far tolerates well, will switch to DOAC tomorrow.  Hypertension  Home meds:  None  Current meds: none scheduled  BP stable  SBP goal < 160 due to hemorrhagic conversion . Long-term BP goal normotensive  Dysphagia   NPO  Coretrak 5/7  PEG placement 5/14  On TF @ 55  FW 250 Q4h -> 200 Q6  Will consult dietitian for bolus feeding.   Ileus, resolved  abd distension w/ decreased BS  KUB 5/2 distended colonic loop  Miralax   Flexiseal following laxatives  On reglan, laxatives on hold, decrease fentanyl  Coretrak 5/7  PEG 5/14  TF resumed after PEG  Acute Renal Failure    Renal US - increased cortical echogenicity in B kidneys c/w renal dz. Increased echogenicity of liver ?hepatic steatosis.  Urine studies - elevated protein creatinine ratio, Na normal, UN normal  Creatinine - 1.20->1.06->0.93->0.94->0.91->0.97->0.82->0.86->0.86  Diuresis - per CCM  Tobacco abuse  Current smoker  Smoking cessation counseling will be provided if able  Other Stroke Risk  Factors  Obesity, Body mass index is 29.39 kg/m., recommend weight loss, diet and exercise as appropriate    Likely undiagnosed obstructive sleep apnea based on body habitus  Other Active Problems  Code Status - Full code  Hypokalemia - resolved - 4.3->3.7  Thrombocytopenia - 194 - resolved  Elevated LFT - AST/ALT 85/156 -> 54/102, improved    Constipation. Miralax daily -> loose stool -> miralax discontinued  Charm trial for cytotoxic edema  Mild anemia - Hb - 13.2->11.5  Hospital day # 24  Rosalin Hawking, MD PhD Stroke Neurology 07/25/2019 4:43 PM  To contact Stroke Continuity provider, please refer to [email or web address] After hours, contact General Neurology

## 2019-07-25 NOTE — Progress Notes (Signed)
ANTICOAGULATION CONSULT NOTE - Follow Up Consult  Pharmacy Consult for Heparin/apixaban Indication: bilateral DVT, possible PE, CVA  Allergies  Allergen Reactions  . Tegaderm Ag Mesh [Silver] Other (See Comments)    blisters    Patient Measurements: Height: 6' (182.9 cm) Weight: 98.3 kg (216 lb 11.4 oz) IBW/kg (Calculated) : 77.6 Heparin Dosing Weight: 97.8 kg  Vital Signs: Temp: 99.1 F (37.3 C) (05/22 1612) Temp Source: Oral (05/22 1612) BP: 113/73 (05/22 1612) Pulse Rate: 88 (05/22 1612)  Labs: Recent Labs    07/23/19 0251 07/23/19 2058 07/24/19 0341 07/24/19 1453 07/25/19 0259  HGB 12.4*  --  13.2  --  11.5*  HCT 39.8  --  43.6  --  37.8*  PLT 194  --  171  --  195  HEPARINUNFRC  --    < > 0.64 0.46 0.31  CREATININE 0.82  --  0.86  --  0.86   < > = values in this interval not displayed.    Estimated Creatinine Clearance: 124.9 mL/min (by C-G formula based on SCr of 0.86 mg/dL).  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 Patient with extensive bilateral DVT per duplex 5/8 and possible PE per notes. S/p IVC filter on 5/9. Started on heparin anticoagulation for DVT 5/20. Patient not on [facility] PTA. Prior Dr. Roda Shutters, aim for lower heparin level goal and no bolues. Was on SQ heparin 5/17>>5/20.  Plan to convert to apixaban in am  Goal of Therapy:  Heparin level 0.3-0.5 units/ml Monitor platelets by anticoagulation protocol: Yes   Plan:  DC heparin IV at 0800 Sun Am Give apixaban 5 mg BID at time of heparin stop  Elmer Sow, PharmD, BCPS, BCCCP Clinical Pharmacist [phone]  Please check AMION for all [facility] Pharmacy numbers  07/25/2019 5:10 PM

## 2019-07-26 LAB — CBC
HCT: 37 % — ABNORMAL LOW (ref 39.0–52.0)
Hemoglobin: 11.4 g/dL — ABNORMAL LOW (ref 13.0–17.0)
MCH: 29.1 pg (ref 26.0–34.0)
MCHC: 30.8 g/dL (ref 30.0–36.0)
MCV: 94.4 fL (ref 80.0–100.0)
Platelets: 195 10*3/uL (ref 150–400)
RBC: 3.92 MIL/uL — ABNORMAL LOW (ref 4.22–5.81)
RDW: 15.7 % — ABNORMAL HIGH (ref 11.5–15.5)
WBC: 11.6 10*3/uL — ABNORMAL HIGH (ref 4.0–10.5)
nRBC: 0.3 % — ABNORMAL HIGH (ref 0.0–0.2)

## 2019-07-26 LAB — BASIC METABOLIC PANEL
Anion gap: 10 (ref 5–15)
BUN: 23 mg/dL — ABNORMAL HIGH (ref 6–20)
CO2: 27 mmol/L (ref 22–32)
Calcium: 8.9 mg/dL (ref 8.9–10.3)
Chloride: 101 mmol/L (ref 98–111)
Creatinine, Ser: 0.72 mg/dL (ref 0.61–1.24)
GFR calc Af Amer: >60 mL/min (ref >60)
GFR calc non Af Amer: >60 mL/min (ref >60)
Glucose, Bld: 119 mg/dL — ABNORMAL HIGH (ref 70–99)
Potassium: 4.5 mmol/L (ref 3.5–5.1)
Sodium: 138 mmol/L (ref 135–145)

## 2019-07-26 LAB — GLUCOSE, CAPILLARY
Glucose-Capillary: 114 mg/dL — ABNORMAL HIGH (ref 70–99)
Glucose-Capillary: 120 mg/dL — ABNORMAL HIGH (ref 70–99)
Glucose-Capillary: 123 mg/dL — ABNORMAL HIGH (ref 70–99)
Glucose-Capillary: 124 mg/dL — ABNORMAL HIGH (ref 70–99)
Glucose-Capillary: 127 mg/dL — ABNORMAL HIGH (ref 70–99)
Glucose-Capillary: 141 mg/dL — ABNORMAL HIGH (ref 70–99)

## 2019-07-26 LAB — HEPATIC FUNCTION PANEL
ALT: 73 U/L — ABNORMAL HIGH (ref 0–44)
AST: 57 U/L — ABNORMAL HIGH (ref 15–41)
Albumin: 2.2 g/dL — ABNORMAL LOW (ref 3.5–5.0)
Alkaline Phosphatase: 73 U/L (ref 38–126)
Bilirubin, Direct: 0.2 mg/dL (ref 0.0–0.2)
Indirect Bilirubin: 0.4 mg/dL (ref 0.3–0.9)
Total Bilirubin: 0.6 mg/dL (ref 0.3–1.2)
Total Protein: 6.2 g/dL — ABNORMAL LOW (ref 6.5–8.1)

## 2019-07-26 MED ORDER — APIXABAN 5 MG PO TABS
5.0000 mg | ORAL_TABLET | Freq: Two times a day (BID) | ORAL | Status: DC
  Administered 2019-07-26 – 2019-08-28 (×66): 5 mg
  Filled 2019-07-26 (×65): qty 1

## 2019-07-26 NOTE — Plan of Care (Signed)
  Problem: Education: Goal: Knowledge of General Education information will improve Description: Including pain rating scale, medication(s)/side effects and non-pharmacologic comfort measures Outcome: Progressing   Problem: Coping: Goal: Level of anxiety will decrease Outcome: Progressing   Problem: Safety: Goal: Ability to remain free from injury will improve Outcome: Progressing   

## 2019-07-26 NOTE — Progress Notes (Addendum)
STROKE TEAM PROGRESS NOTE   INTERVAL HISTORY RN at bedside. Pt lying in bed, eyes closed but easily arousable. Still global aphasia, moving left side spontaneously. Less secretion and suctioning as before. Vital stable, intermittent tachycardia.   Vitals:   07/26/19 0804 07/26/19 0823 07/26/19 1130 07/26/19 1139  BP: 126/73 126/73 124/76 115/77  Pulse: 86 97  98  Resp: (!) 21 (!) 24  (!) 24  Temp: 98 F (36.7 C)   98.3 F (36.8 C)  TempSrc: Axillary   Axillary  SpO2: 94%     Weight:      Height:       CBC:  Recent Labs  Lab 07/25/19 0259 07/26/19 0402  WBC 11.5* 11.6*  HGB 11.5* 11.4*  HCT 37.8* 37.0*  MCV 95.9 94.4  PLT 195 993   Basic Metabolic Panel:  Recent Labs  Lab 07/25/19 0259 07/26/19 0402  NA 139 138  K 3.7 4.5  CL 98 101  CO2 31 27  GLUCOSE 121* 119*  BUN 25* 23*  CREATININE 0.86 0.72  CALCIUM 9.0 8.9    IMAGING past 24 hours No results found.  PHYSICAL EXAM    General - mildly obese middle-aged Caucasian male, s/p tracheostomy on trach collar  Ophthalmologic - fundi not visualized due to noncooperation.  Cardiovascular - Regular rate and rhythm.  Neuro - s/p tracheostomy on trach collar. No sedation, eyes closed but open with voice. Still not following any simple commands. left gaze preference position, barely cross midline, not blinking to visual threat on the left, doll's eyes present, pupil left 55mm, right 4.33mm, both reactive to light. Corneal reflex weak on the right and brisk on the left, gag and cough present. Breathing over the vent.  right facial droop.  Tongue protrusion not cooperative. Spontaneous and purposeful movement left UE 4/5 and at least LE 2+/5. However, right UE and LE plegic. DTR 1+ and no babinski. Sensation, coordination and gait not tested.    ASSESSMENT/PLAN [name] with history of COPD, ongoing tobacco use, and psoriasis, presenting with dense LMCA syndrome. Attempt for Left ICA and  Left M1 thrombectomy was done emergently with TICI 2b flow. Unfortunately, the pt had post procedure hemorrhage and reocclusion of the vessels. F/u imaging suggested early edema and pt underwent prophylactic decompressive hemicraniectomy.  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  Stroke etiology unknown - given DVT requiring eventual AC and high mRS, will not do further embolic workup  Code Stroke CT head showed early hypodensity in nearly entire LMCA. ASPECTS 6-7 per notes.  CTA head & neck occlusion cervical ICA on the left. This is most likely due to dissection. The patient has minimal atherosclerotic disease.  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  There is poor collateral circulation on the left.   CTP CBF large core volume of 266 mL, perfusion volume 313 mL, mismatch volume 47 mL.  ASPECTS using CTP by Dr. Carlis Abbott 6 or 7.   IR - TICI2c revascularization  MRI  Restricted diffusion throughout the majority of the left MCA and ACA vascular territories consistent with acute ischemia.  MRA - reocclusion  of the intracranial left internal carotid artery. No flow related signal is seen within the left middle cerebral artery.  CT Head 07/04/19 - Slightly increased rightward midline shift with a small amount of herniation beneath the anterior falx. Otherwise unchanged examination.  CT repeat 5/6 stable infarcts and SAH but increasing edema w/ midline shift now 46mm and herniation through crani site  CT repeat 5/21 decreased shift 5->30mm. Extensive L crani w/ buldging through defect. Still L ACA and MCA low density w/ petechial hemorrhage but no large hemorrhage.   2D  Echo - EF 55 - 60%. No cardiac source of emboli identified.   LDL 45  HgbA1c 5.5  Subq heparin for VTE prophylaxis  No antithrombotic prior to admission, now on Eliquis.  Therapy recommendations:  SNF  Disposition:  Pending - LTACH consulted however no insurance - pt need to downsize to cuffless trach before discharge - CCM following.   Respiratory failure s/p Trach Trach wound  Intubated on vent  CCM on board  CXR 07/03/19- New consolidation in the right lower lobe and left infrahilar region concerning for pneumonia. Possible small layering right effusion.  CXR 5/3 mild pulm edema w/ atx vs infiltrate/aspiration  On cefepime 4/30>>5/2 for pneumonia  Augmentin 5/2>>5/4  Unasyn 5/5>>5/6  Trach placed 5/8 per CCM  WOC consulted for trach wound ->progressed to stage 3 pressure, for dressing change q3d and prn  Fever and elevated white count and tachycardia hence restarted on Vanc & Cefepime 5/11>>5/13  Ancef 5/14>>5/17  (pneumonia)  On trach collar since 5/14 w/ high FiO2 requirements  On progressive floor  Cerebral edema Hypernatremia   MRI - Restricted diffusion throughout the majority of the left MCA and ACA vascular territories consistent with acute ischemia.  CT Head 07/04/19 - Slightly increased rightward midline shift with a small amount of herniation beneath the anterior falx. Otherwise unchanged examination.  S/p decompressive left hemicraniectomy  4/28 Dr Franky Macho  CT 5/6 - stable infarcts and SAH but increasing edema w/ midline shift now 57mm and herniation through crani site  CT head 5/20 - decreased shift 5->23mm. Extensive L crani w/ buldging through defect. Still L ACA and MCA low density w/ petechial hemorrhage but no large hemorrhage.  completed keppra 2 wk course per NSG  Off 3% and D5NS  Okay to allow Na gradually normalize  Na 145->144->139->138  Crani sutures out -> suture incision leaking 5/16 - s/p local suture  Sepsis, resolved Fever  w/ leukocytosis, resolved HCAP, resolved  Afebrile  Leukocytosis - 13.2->12.4->12.5->12.0->11.5  UA neg  Sputum moraxella, H. flu   On cefepime 4/30>>5/2; Augmentin 5/2>>5/4, Unasyn 5/5>>5/6 -> off  Blood culture 5/2 no growth  Blood cultures 5/7 - no growth   Resp cultures 5/7 - no growth  CXR 5/9 - streaky BLL opacilites atx/consolication/PNA. Possible R pleural effusion  CXR 5/10 w/ atx vs infiltrate  CXR 07/15/19 - Stable bibasilar atelectasis a small right pleural effusion. No new opacity evident. Stable cardiac silhouette  Sputum cx 5/11 reincubated   Blood Cx 5/11 no growth   Vanc 5/11>>5/13, Cefepime 5/11>>5/13  Ancef 5/14>>5/17  (pneumonia)  CXR 5/18 slight improvement in bibasilar atx  Bilateral Lower Ext DVT, extensive Possible PE given tachycardia  Venous US 07/11/2019 DVT + bilateral extensive  IVC filter placed 5/9  On eliquis now for DVT prophylaxis  Hypertension  Home meds:  None  Current meds: none scheduled  BP stable  SBP goal < 160 due to hemorrhagic conversion . Long-term BP goal  normotensive  Dysphagia   NPO  Coretrak 5/7  PEG placement 5/14  On TF @ 55  FW 250 Q4h -> 200 Q6  Will consult dietitian for bolus feeding (pending)  Ileus, resolved  Abd distension w/ decreased BS  KUB 5/2 distended colonic loop  Miralax   Flexiseal following laxatives  On reglan, laxatives on hold, decrease fentanyl  Coretrak 5/7  PEG 5/14  TF resumed after PEG  Acute Renal Failure, resolved  Renal US - increased cortical echogenicity in B kidneys c/w renal dz. Increased echogenicity of liver ?hepatic steatosis.  Urine studies - elevated protein creatinine ratio, Na normal, UN normal  Creatinine - 1.20->1.06->0.93->0.94->0.91->0.97->0.82->0.86->0.86->0.72  Diuresis - per CCM  Tobacco abuse  Current smoker  Smoking cessation counseling will be provided if able  Other Stroke Risk Factors  Obesity, Body mass index is  29.57 kg/m., recommend weight loss, diet and exercise as appropriate    Likely undiagnosed obstructive sleep apnea based on body habitus  Other Active Problems  Code Status - Full code  Hypokalemia - resolved - 4.3->3.7->4.5  Thrombocytopenia - 194 - resolved  Elevated LFT - AST/ALT 85/156 -> 54/102, improved    Constipation. Miralax daily -> loose stool -> miralax discontinued  Charm trial for cytotoxic edema  Mild anemia - Hb - 13.2->11.5  Hospital day # 25  Marvel Plan, MD PhD Stroke Neurology 07/26/2019 6:48 PM   To contact Stroke Continuity provider, please refer to [email or web address]. After hours, contact General Neurology

## 2019-07-27 ENCOUNTER — Inpatient Hospital Stay (HOSPITAL_COMMUNITY): Payer: Medicaid Other

## 2019-07-27 LAB — BASIC METABOLIC PANEL
Anion gap: 9 (ref 5–15)
BUN: 20 mg/dL (ref 6–20)
CO2: 26 mmol/L (ref 22–32)
Calcium: 8.9 mg/dL (ref 8.9–10.3)
Chloride: 101 mmol/L (ref 98–111)
Creatinine, Ser: 0.85 mg/dL (ref 0.61–1.24)
GFR calc Af Amer: >60 mL/min (ref >60)
GFR calc non Af Amer: >60 mL/min (ref >60)
Glucose, Bld: 144 mg/dL — ABNORMAL HIGH (ref 70–99)
Potassium: 4.4 mmol/L (ref 3.5–5.1)
Sodium: 136 mmol/L (ref 135–145)

## 2019-07-27 LAB — CBC
HCT: 40.4 % (ref 39.0–52.0)
Hemoglobin: 12.6 g/dL — ABNORMAL LOW (ref 13.0–17.0)
MCH: 29.5 pg (ref 26.0–34.0)
MCHC: 31.2 g/dL (ref 30.0–36.0)
MCV: 94.6 fL (ref 80.0–100.0)
Platelets: 196 10*3/uL (ref 150–400)
RBC: 4.27 MIL/uL (ref 4.22–5.81)
RDW: 16.4 % — ABNORMAL HIGH (ref 11.5–15.5)
WBC: 12.5 10*3/uL — ABNORMAL HIGH (ref 4.0–10.5)
nRBC: 0.3 % — ABNORMAL HIGH (ref 0.0–0.2)

## 2019-07-27 LAB — GLUCOSE, CAPILLARY
Glucose-Capillary: 115 mg/dL — ABNORMAL HIGH (ref 70–99)
Glucose-Capillary: 138 mg/dL — ABNORMAL HIGH (ref 70–99)
Glucose-Capillary: 110 mg/dL — ABNORMAL HIGH (ref 70–99)
Glucose-Capillary: 121 mg/dL — ABNORMAL HIGH (ref 70–99)
Glucose-Capillary: 116 mg/dL — ABNORMAL HIGH (ref 70–99)

## 2019-07-27 LAB — PROCALCITONIN: Procalcitonin: 0.5 ng/mL

## 2019-07-27 MED ORDER — VANCOMYCIN HCL IN DEXTROSE 1-5 GM/200ML-% IV SOLN
1000.0000 mg | Freq: Three times a day (TID) | INTRAVENOUS | Status: DC
  Administered 2019-07-28 – 2019-07-30 (×7): 1000 mg via INTRAVENOUS
  Filled 2019-07-27 (×7): qty 200

## 2019-07-27 MED ORDER — FREE WATER
150.0000 mL | Status: DC
  Administered 2019-07-27 – 2019-08-28 (×188): 150 mL

## 2019-07-27 MED ORDER — OSMOLITE 1.5 CAL PO LIQD
474.0000 mL | Freq: Three times a day (TID) | ORAL | Status: DC
  Administered 2019-07-27 – 2019-08-20 (×71): 474 mL
  Filled 2019-07-27 (×77): qty 474

## 2019-07-27 MED ORDER — VANCOMYCIN HCL 2000 MG/400ML IV SOLN
2000.0000 mg | Freq: Once | INTRAVENOUS | Status: AC
  Administered 2019-07-27: 2000 mg via INTRAVENOUS
  Filled 2019-07-27: qty 400

## 2019-07-27 MED ORDER — SODIUM CHLORIDE 0.9 % IV SOLN
2.0000 g | Freq: Three times a day (TID) | INTRAVENOUS | Status: DC
  Administered 2019-07-28 – 2019-08-02 (×17): 2 g via INTRAVENOUS
  Filled 2019-07-27 (×17): qty 2

## 2019-07-27 MED ORDER — PRO-STAT SUGAR FREE PO LIQD
30.0000 mL | Freq: Three times a day (TID) | ORAL | Status: DC
  Administered 2019-07-27 – 2019-08-28 (×94): 30 mL
  Filled 2019-07-27 (×92): qty 30

## 2019-07-27 MED ORDER — SODIUM CHLORIDE 0.9 % IV SOLN: 2.0000 g | Freq: Two times a day (BID) | INTRAVENOUS | Status: DC

## 2019-07-27 NOTE — Progress Notes (Signed)
Trach changed per order from Simonne Martinet, NP. Changed to #6 shiley cuffless. Color change on ETCO2 and bilateral BS.

## 2019-07-27 NOTE — Significant Event (Signed)
Rapid Response Event Note  Overview: Called at 2108 to report MEWS score of 5 for T-102.7, HR-121, and LOC-verbal. Per RN, MD had already been notified and ordered PCXR, BC, and U/A. RN also treating fever with tylenol.   Called again at 2230 with concern for pt's BP dropping. BP-91/54.  Initial Focused Assessment: Pt laying in bed with eye closed, will open eyes to verbal stimulation, follow commands on the L side, and make purposeful movements with L side. Pt is baseline aphasic and has R sided hemiplegia. Lung sounds clear and diminished t/o. T-99.8, HR-115, BP-95/69, RR-19, SpO2-96% on .28 TC.  Interventions: Vanc and maxipime ordered Plan of Care (if not transferred): Continue to monitor pt and BP closely. Call RRT if further assistance needed.  Event Summary:  Dr. Amada Jupiter notified by bedside RN  Called: 2108 and 2230 Arrived: 2237 Ended: 2250  Terrilyn Saver

## 2019-07-27 NOTE — Progress Notes (Signed)
NAME:  Lee Hooper, MRN:  [id number], DOB:  [birth year], LOS: 26 ADMISSION DATE:  07/01/2019, CONSULTATION DATE:  07/01/19 REFERRING MD:  Aroor MD, CHIEF COMPLAINT:  CVA    Brief History   Lee Hooper is a 49 yo M who presented as a code stroke after being found aphasic and nonverbal by his wife. CT/CTA studies showed left ICA/MCA thrombosis and pt was taken for IR thrombectomy for left ICA and left M1 occlusions. Repeat head CT was obtained following the procedure out of concern for possible hemorrhage and showed moderate basal ganglia hemorrhage vs stroke. Repeat MRI revealed that the ICA and MCA re-occluded with change in FLAIR sequence excluding him from repeat thrombectomy. He underwent a L hemicraniectomy. He was intubated for airway protection.  Past Medical History  Psoriasis  COPD  Tobacco Use  Significant Hospital Events   4/28: Admit; L ICA/MCA Thrombectomy with re-occlusion --> left hemicraniectomy, intubated for acute hypoxic respiratory failure  5/3: NAE. Was taken off precedex yesterday and pt opened his eyes. Prednisone 50 mg qd was begun. He had a persistent fever around 38.2, HR 110s, BP 140s/60s. Pt required increased O2 with secretions; CXR showed mild edema with small effusions c/f aspiration vs opacification. Blood culture x2 and urine culture collected, NGTD. ABG pending. Currently satting 92% on PRVC FiO2 80, PEEP 10, Plateau 22. WBC declined to 10.5 from 11.1, Na 151 from 156. Tube feeds were held d/t distended colonic loop in RLQ on KUB, to be continued today. Thrombocytopenia improved to 93 from 81.  5/4: Patient remains intubated with spontaneous movement of left arm and leg.  5/5: NAE. Pt remains intubated on PRN fetanyl. Will open eyes and spontaneously move left leg and arm.  5/6: NAE overnight. Intubated with no sedation. 5/7: NAE. Marland Kitchen Completed Unasyn yesterday. VSS, spO2 remains in low 90s the majority of the time on 620/40%/+5. Morning labs significant  for HCO3 of 19. Otherwise unremarkable.  5/8: Ongoing fever.  Obtained ultrasound, positive for lower extremity DVT bilaterally.  Tracheostomy placed at bedside.,  Did have some SVT this responded to treating fever and rate control. 5/9: IVC filter placed 5/11: no acute events overnight. Pt with similar exam as previous however tachycardic and febrile (tmax102.7) with a bit increased wbc and new R infiltrate noted in base. Pan cx and start empiric abx. 5/12 Tolerated 8 hours of trach collar  5/17 completed antibiotics, added IV Lasix, for ongoing high supplemental oxygen requirements.  Because of this we delayed transfer out 5/18 improved oxygenation, improved x-ray.  Weaning oxygen.  Transitioning to aerosol trach collar 5/18 to 5/24: making slow progress on I&O balance but oxygen requirements much improved. DOAC started 5/23 w/ no new issues. Will ask RT to change to cuffless.  Consults:  IR PCCM  Neuro (Primary) Neurosurgery  Procedures:  4/28 IR - L ICA/MCA Thrombectomy 4/28 L hemicraniectomy 4/28 Intubation 5/08 Trach   Significant Diagnostic Tests:  4/28 Admit CT Head Code Stroke:  Hyperdense left ICA and MCA compatible with acute thrombus. No acute infarct or hemorrhage.  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 The patient has minimal atherosclerotic disease. The left internal carotid artery is occluded through the terminus and extending in the left M1 M2 and M3 branches. There is poor collateral circulation on the left. There is a large territory infarct involving the left hemisphere. Infarct volume 266 mL.  4/28 Admit  MR Brain:  Restricted diffusion throughout much of the left MCA vascular territory consistent with acute ischemia. Restricted diffusion consistent with acute ischemia is also present within the paramedian left frontoparietal lobes, although this is less well assessed  due to the degree of motion degradation at the level of the vertex. There is little if any corresponding T2/FLAIR hyperintensity at these sites. No significant mass effect. No midline shift.  4/28 Post Thrombectomy CT Head: : 1. 3.0 x 2.6 x 3.9 cm region of hyperdensity centered within the left basal ganglia, left subinsular region and inferomedial left temporal lobe likely reflecting a combination of parenchymal hematoma and contrast staining.  2. Edema with loss of gray-white differentiation within the left basal ganglia, left insula and anterior left temporal lobe, likely acute infarction. Subtle changes of acute infarction are also suspected within the paramedian left frontal lobe ACA vascular territory. 3. Scattered small-volume subarachnoid hemorrhage along the left cerebral hemisphere. 4. Regional mass effect with effacement of the left lateral ventricle temporal horn. No midline shift.  4/28 Post Thrombectomy MR Brain:  1. Restricted diffusion throughout the majority of the left MCA and ACA vascular territories consistent with acute ischemia. 2. No significant mass effect at this time. No midline shift. 3. The acute parenchymal hemorrhage and/or contrast staining centered within left basal ganglia and inferomedial left temporal lobe on prior head CT does not appear significantly changed. Based on the MR appearance, it is suspected that a significant component of this previously demonstrated hyperdensity reflects contrast staining. 4. Redemonstrated small volume subarachnoid hemorrhage overlying the left cerebral hemisphere. Small volume subarachnoid hemorrhage is also questioned along the right cerebral hemisphere posteriorly.  4/29 Post Hemicraniectomy CT head:  Complete left ACA/MCA territory infarction with swollen brain bulging through craniectomy defect. No midline shift or entrapment. Petechial hemorrhage at left basal ganglia. Extraaxial hemorrhage along surface of the infarct.    5/1 Head CT > Slightly increased rightward midline shift with a small amount of herniation beneath the anterior falx. Otherwise unchanged Examination.  5/6 Head CT: Left ACA and MCA territory infarct with unchanged degree of subarachnoid hemorrhage. Swelling has progressed with greater herniation through the craniectomy defect and 5 mm of midline shift.  5/8: Bilateral acute DVT involving the right common femoral vein right proximal profundal vein and right popliteal vein also left DVT involving same vessels  5/12 Renal US: Technically challenging exam due to patient's intubated status and difficulty with repositioning.  Diffusely increased cortical echogenicity in both kidneys compatible with medical renal disease.  Increased echogenicity of the liver with diminished through transmission, most often reflective of hepatic steatosis.  5/20 Ct H> significant motion artifact. Continued low density and swelling of L hemisphere in L ACA and MCA idstributions. Areas of petechial hemorrhage, no large hematoma. L craniectomy w bulging of brain through defect. Reduced mass effect with L to R shift now 71mm from 94mm previously  Micro Data:  4/28: SARS-CoV-2/Influenza A/Influenza B PCR: Negative  4/30: Respiratory culture: Abundant Moraxella Catarrhalis, Haemophilus Influenzae, beta lactamase positive  5/2: Urine culture: NGTD  5/2: BC x2: NGTD 5/7 resp >> H flu 5/7 bld >> negative 5/11 BCx2 >> negative 5/11 trach asp >> E coli  Antimicrobials:  4/30 - 5/2: Cefepime 5/2 - 5/4: Augmentin  5/5 - 5/6 unasyn   Vanc 5/11 >> 5/13 Cefepime 5/11 >> 5/13 Ancef 5/14 >> 5/17  Interim history/subjective:  Awake. Appears comfortable   Objective   Blood pressure 138/74, pulse (Abnormal) 104, temperature 98.3 F (36.8  C), resp. rate 20, height 6' (1.829 m), weight 97 kg, SpO2 95 %.    FiO2 (%):  [28 %-35 %] 28 %   Intake/Output Summary (Last 24 hours) at 07/27/2019 1428 Last data filed at  07/27/2019 0416 Gross per 24 hour  Intake 440 ml  Output 1950 ml  Net -1510 ml   Filed Weights   07/25/19 0339 07/26/19 0500 07/27/19 0500  Weight: 98.3 kg 98.9 kg 97 kg    Examination: General this is a 49 year old white male resting in bed. Not in acute distress HENT crani surgical site intact. Some swelling from the craniectomy noted. # 6 cuffless trach is unremarkable. Pulm clear non-labored. Good cough. 28% atc  Card RRR abd PEG-->now on bolus feeds GU cl yellow Neuro opens eyes to verbal request. I cannot get him to follow commands but this is not new from last week. Still moves the left side spont. Does not move the right   Resolved Hospital Problem list   treated HCAP with Moraxella and Haemophilus influenza, these were beta-lactamase positive, antibiotics were completed 5/6; COPD exacerbation. Ileus, AKI, hypernatremia Completed E. coli HCAP treatment on 5/18 Assessment & Plan:   Acute hypoxic respiratory failure with compromised airway. Failure to wean s/p tracheostomy. Hx of smoking with presumed COPD. Large Lt ICA/MCA CVA s/p thrombectomy and decompressive hemicraniectomy 2nd to cerebral edema and hemorrhage. Mild hypernatremia-->improved w/ Free water replacement  B/l lower extremity DVT.- s/p IVC filter 5/09, now on [facility] SVT.  Discussion  Looks comfortable on 28% ATC. Had nice response to lasix (bringing him down from 80% last week to 28% currently). Mental status the same. Doubt he will be a candidate for decannulation. We can however get the cuffless trach out.   Plan Cont ATC Change trach to 6 cuffless.  Family needs trach training  Pulse ox     Best practice:  Diet: EN  DVT prophylaxis:  IVC filter 5/9, on hep gtt GI prophylaxis: Protonix Mobility: PT/ OT Code Status: FULL Disposition: SDU   Simonne Martinet ACNP-BC [facility] Pulmonary/Critical Care Pager # [phone] OR # [phone] if no answer

## 2019-07-27 NOTE — Progress Notes (Signed)
Physical Therapy Treatment Patient Details Name: Lee Hooper MRN: [id number] DOB: [birth year] Today's Date: 07/27/2019    History of Present Illness This 49 y.o. male admitted 4/28 with Rt sided hemiplegia and aphasia.  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  Repeat CT following procedure showed moderate basal ganglia hemorrhage vs. stroke   Repeat MRI revealed that the ICA and MCA re-occluded with change in FLAIR sequence excluding him from repeat thrombectomy.  He underwent a decompressive hemicraniectomy.  He waws intubated 4/28.  He developed an ileus 5/2. Trach 5/8. IVC filter placed 5/9.  Peg placement.  PMH includes: COPD, tobacco abuse, psoriasis    PT Comments    Patient lethargic today but able to wake up once sitting EOB with focused attention. Pt smiling appropriately when looking at pictures of family and grandchildren. Continues to have left gaze preference but able to get to midline and right past midline with family pictures. Follows 1 step commands inconsistently. Difficult to fully assess cognition due to aphasia. Worked on cervical and trunk activation and posture with total A. Will follow.   Follow Up Recommendations  SNF     Equipment Recommendations  Wheelchair (measurements PT);Wheelchair cushion (measurements PT);Hospital bed    Recommendations for Other Services       Precautions / Restrictions Precautions Precautions: Fall Precaution Comments: R hemi, trach, PEG, flexiseal; No Bone flap L head;crani Restrictions Weight Bearing Restrictions: No    Mobility  Bed Mobility Overal bed mobility: Needs Assistance Bed Mobility: Rolling;Sidelying to Sit;Sit to Supine Rolling: Total assist;+2 for physical assistance Sidelying to sit: Total assist;+2 for physical assistance Supine to sit: Total assist;+2 for physical assistance;+2 for safety/equipment   Sit to sidelying: Total assist;+2 for physical assistance General bed mobility comments:  hand over hand assist for L UE to reach for rail, but unable to purposefully use to assist. Worked on rolling towards right using LUE and WB through left heel.  Transfers                 General transfer comment: deferred  Ambulation/Gait                 Stairs             Wheelchair Mobility    Modified Rankin (Stroke Patients Only) Modified Rankin (Stroke Patients Only) Pre-Morbid Rankin Score: No symptoms Modified Rankin: Severe disability     Balance Overall balance assessment: Needs assistance Sitting-balance support: Feet supported;No upper extremity supported;Single extremity supported Sitting balance-Leahy Scale: Poor Sitting balance - Comments: pushing with L UE towards Rt, redirected with L UE on pillows, posterior and Rt lateral lean ; worked on cervical rotation, activation of back musculature and posture as well as gaze using family pictures. Postural control: Posterior lean;Right lateral lean                                  Cognition Arousal/Alertness: Lethargic Behavior During Therapy: Flat affect;Impulsive Overall Cognitive Status: Impaired/Different from baseline Area of Impairment: Attention;Following commands;Safety/judgement;Awareness;Problem solving                   Current Attention Level: Focused   Following Commands: Follows one step commands inconsistently Safety/Judgement: Decreased awareness of safety;Decreased awareness of deficits Awareness: Intellectual Problem Solving: Slow processing;Decreased initiation;Difficulty sequencing;Requires verbal cues;Requires tactile cues General Comments: pt able to follow simple commands with increased time, no distractions for short  bouts. lethargic and max cueing to maintain eyes open. Smiles when seeing pics of grandchildren. Left gaze preference, able to get to midline and right past it using family pictures of grandchil;dren.      Exercises General Exercises -  Upper Extremity Shoulder Flexion: PROM;Right;5 reps Shoulder Extension: PROM;Right;5 reps Shoulder ABduction: PROM;Right;5 reps Elbow Flexion: PROM;Right;5 reps Elbow Extension: PROM;Right;5 reps Wrist Flexion: PROM;Right;5 reps Wrist Extension: PROM;Right;5 reps Digit Composite Flexion: PROM;Right;5 reps Composite Extension: PROM;Right;5 reps Other Exercises Other Exercises: PROM of R supniation/pronation x 5 reps  Other Exercises: seated head turns with max-total assist, chest opening and stretch into retraction total assist     General Comments General comments (skin integrity, edema, etc.): VSS on 28% Fi02 5L      Pertinent Vitals/Pain Pain Assessment: Faces Faces Pain Scale: No hurt    Home Living                      Prior Function            PT Goals (current goals can now be found in the care plan section) Acute Rehab PT Goals Patient Stated Goal: none stated by patient Progress towards PT goals: Progressing toward goals    Frequency    Min 2X/week      PT Plan Current plan remains appropriate    Co-evaluation PT/OT/SLP Co-Evaluation/Treatment: Yes Reason for Co-Treatment: Complexity of the patient's impairments (multi-system involvement);Necessary to address cognition/behavior during functional activity;For patient/therapist safety;To address functional/ADL transfers PT goals addressed during session: Mobility/safety with mobility;Balance;Strengthening/ROM OT goals addressed during session: ADL's and self-care      AM-PAC PT "6 Clicks" Mobility   Outcome Measure  Help needed turning from your back to your side while in a flat bed without using bedrails?: Total Help needed moving from lying on your back to sitting on the side of a flat bed without using bedrails?: Total Help needed moving to and from a bed to a chair (including a wheelchair)?: Total Help needed standing up from a chair using your arms (e.g., wheelchair or bedside chair)?:  Total Help needed to walk in hospital room?: Total Help needed climbing 3-5 steps with a railing? : Total 6 Click Score: 6    End of Session Equipment Utilized During Treatment: Oxygen Activity Tolerance: Patient tolerated treatment well;Patient limited by lethargy Patient left: in bed;with call bell/phone within reach;with bed alarm set Nurse Communication: Mobility status;Need for lift equipment PT Visit Diagnosis: Other abnormalities of gait and mobility (R26.89);Hemiplegia and hemiparesis Hemiplegia - Right/Left: Right Hemiplegia - dominant/non-dominant: Dominant Hemiplegia - caused by: Nontraumatic intracerebral hemorrhage     Time: 8756-4332 PT Time Calculation (min) (ACUTE ONLY): 28 min  Charges:  $Neuromuscular Re-education: 8-22 mins                     Marisa Severin, PT, DPT Acute Rehabilitation Services Pager [phone] Office [place] 07/27/2019, 2:55 PM

## 2019-07-27 NOTE — Progress Notes (Signed)
Nutrition Follow-up  DOCUMENTATION CODES:   Obesity unspecified  INTERVENTION:  Transition pt to bolus feeding via PEG: -2 cans Osmolite 1.5 cal (491m) TID  -351mPro-stat TID -15075mree water Q4H  Tube feeding regimen will provide 2430 kcals, 134 grams of protein, 1086m83mee water (1986ml53mal free water with flushes)  NUTRITION DIAGNOSIS:   Inadequate oral intake related to inability to eat as evidenced by NPO status.  Ongoing.  GOAL:   Patient will meet greater than or equal to 90% of their needs  Met with TF.   MONITOR:   TF tolerance  REASON FOR ASSESSMENT:   Consult, Ventilator Enteral/tube feeding initiation and management  ASSESSMENT:   Pt with PMH of smoking and COPD admitted with L ICA/MCA s/p IR for revascularization however pt had re-occlusion now s/p emergent L hemicraniectomy.  4/30 cortrak placed; tip gastric 5/2 TF held due to ileus 5/6 trickle TF started 5/8 trach placed; IVC filter placed 5/14 PEG placed  RD received consult to transition pt to bolus feeding.   TF via PEG: Osmolite 1.5 @ 55ml/64m1320ml p47may), 60ml Pr2mat BID, 200ml fre22mter Q6H   Labs reviewed. Medications reviewed and include: MVI   UOP: 2,650ml x24 9ms I/O: +16,102.7ml since 7mit  Diet Order:   Diet Order            Diet NPO time specified  Diet effective midnight              EDUCATION NEEDS:   No education needs have been identified at this time  Skin:  Skin Assessment: Skin Integrity Issues: Skin Integrity Issues:: Stage III, Incisions Stage III: R neck Incisions: head, R neck  Last BM:  5/23 type 7  Height:   Ht Readings from Last 1 Encounters:  07/22/19 6' (1.829 m)    Weight:   Wt Readings from Last 1 Encounters:  07/27/19 97 kg    Ideal Body Weight:  80.9 kg  BMI:  Body mass index is 29 kg/m.  Estimated Nutritional Needs:   Kcal:  2300-2500  4166-0630 125-145 grams  Fluid:  2 L/day    Negin Hegg AverLarkin InaLDN RD pager number and weekend/on-call pager number located in [email or web address]

## 2019-07-27 NOTE — Progress Notes (Signed)
Pharmacy Antibiotic Note  Lee Hooper is a 49 y.o. male admitted on 07/01/2019 with a CVA s/p IR thrombectomy for left ICA and left M1 occlusions. Repeat CT after procedure was concerning for possible hemorrhage. Repeat MRI demonstrated that the ICA and MCA re-occluded.   Had been on abx previously - now restarting for concern for PNA - WBC 12.5, PCT 0.5, temp 102.7. Scr 0.85 (CrCl>100 mL/min).   Plan: Give vancomycin 2000 mg IV x 1 as loading dose Start vancomycin 1000 mg IV every 8 hours  Goal trough 15-20 mcg/mL. Cefepime 2g IV q8 hours Monitor temp, WBC, Scr, clinical status, and cultures  Height: 6' (182.9 cm) Weight: 97 kg (213 lb 13.5 oz) IBW/kg (Calculated) : 77.6  Temp (24hrs), Avg:99.3 F (37.4 C), Min:98.2 F (36.8 C), Max:102.7 F (39.3 C)  Recent Labs  Lab 07/23/19 0251 07/24/19 0341 07/25/19 0259 07/26/19 0402 07/27/19 0434  WBC 12.5* 12.0* 11.5* 11.6* 12.5*  CREATININE 0.82 0.86 0.86 0.72 0.85    Estimated Creatinine Clearance: 125.6 mL/min (by C-G formula based on SCr of 0.85 mg/dL).    Allergies  Allergen Reactions  . Tegaderm Ag Mesh [Silver] Other (See Comments)    blisters    Antimicrobials this admission: Cefepime 4/30 >>5/2, 5/24>> Augmentin 5/2>>5/5 Unasyn 5/5 >> 5/6 Cefepime 5/11 >> 5/14 Vancomycin 5/11 >> 5/14, 5/24>> Cefazolin 5/14 >> 5/17  Microbiology results: 4/30 TA: m cat, h. Influenzae - beta lactamase + 5/2 UCx - negative 5/2 BCx - negative 5/7 Bcx - negative 5/7 TA - H.influenza + Moraxella 5/11 BCx - negative 5/11TA - E.coli (S Ancef, Cipro, Zosyn)  Thank you for allowing pharmacy to be a part of this patient's care.  Sherron Monday, PharmD, BCCCP Clinical Pharmacist  07/27/2019 11:05 PM  Please check AMION for all [facility] Pharmacy phone numbers After 10:00 PM, call Main Pharmacy [phone]

## 2019-07-27 NOTE — Progress Notes (Signed)
Patient with leukocytosis, fever, tachycardia. He is aphasic and unable to give history or complaints. I have ordered cefepime and vancomycin as well as blood cultures, CXR, UA. Tylenol for fever.   Ritta Slot, MD [facility] [phone]  If 7pm- 7am, please page neurology on call as listed in AMION.

## 2019-07-27 NOTE — Progress Notes (Addendum)
STROKE TEAM PROGRESS NOTE   INTERVAL HISTORY Patient is lying comfortably in bed.  Vital signs are stable except persistent mild tachycardia.  No neurological changes.  Patient is difficult to place in nursing home till his tracheostomy can be downsized to a cuff less tube.  He remains aphasic with dense right hemiplegia and has semipurposeful movements on the left spontaneously.  Discussed with social work Adult nurse labs are normal.  White count is mildly elevated but stable at 12.5. Vitals:   07/27/19 0944 07/27/19 1215 07/27/19 1216 07/27/19 1520  BP: 138/74 117/73  109/75  Pulse: (!) 104 100  (!) 108  Resp: 20 (!) 26  (!) 22  Temp: 98.5 F (36.9 C) 98.3 F (36.8 C) 98.3 F (36.8 C) 98.8 F (37.1 C)  TempSrc:  Axillary  Axillary  SpO2: 95% 95%  94%  Weight:      Height:       CBC:  Recent Labs  Lab 07/26/19 0402 07/27/19 0434  WBC 11.6* 12.5*  HGB 11.4* 12.6*  HCT 37.0* 40.4  MCV 94.4 94.6  PLT 195 196   Basic Metabolic Panel:  Recent Labs  Lab 07/26/19 0402 07/27/19 0434  NA 138 136  K 4.5 4.4  CL 101 101  CO2 27 26  GLUCOSE 119* 144*  BUN 23* 20  CREATININE 0.72 0.85  CALCIUM 8.9 8.9    IMAGING past 24 hours No results found.  PHYSICAL EXAM      General - mildly obese middle-aged Caucasian male, s/p tracheostomy on trach collar  Ophthalmologic - fundi not visualized due to noncooperation.  Cardiovascular - Regular rate and rhythm.  Neuro - s/p tracheostomy on trach collar. No sedation, eyes closed but open with voice. Still not following any simple commands. left gaze preference position, barely cross midline, not blinking to visual threat on the left, doll's eyes present, pupil left 51mm, right 4.89mm, both reactive to light. Corneal reflex weak on the right and brisk on the left, gag and cough present. Breathing over the vent.  right facial droop.  Tongue protrusion not cooperative. Spontaneous and purposeful movement left UE 4/5 and at least  LE 2+/5. However, right UE and LE plegic. DTR 1+ and no babinski. Sensation, coordination and gait not tested.    ASSESSMENT/PLAN Mr. DONN ZANETTI is a 49 y.o. male with history of COPD, ongoing tobacco use, and psoriasis, presenting with dense LMCA syndrome. Attempt for Left ICA and Left M1 thrombectomy was done emergently with TICI 2b flow. Unfortunately, the pt had post procedure hemorrhage and reocclusion of the vessels. F/u imaging suggested early edema and pt underwent prophylactic decompressive hemicraniectomy.  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  Stroke etiology unknown - given DVT requiring eventual AC and high mRS, will not do further embolic workup  Code Stroke CT head showed early hypodensity in nearly entire LMCA. ASPECTS 6-7 per notes.  CTA head & neck occlusion cervical ICA on the left. This is most likely due to dissection. The patient has minimal atherosclerotic disease.  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  There is poor collateral circulation on the left.   CTP CBF  large core volume of 266 mL, perfusion volume 313 mL, mismatch volume 47 mL.  ASPECTS using CTP by Dr. Carlis Abbott 6 or 7.   IR - TICI2c revascularization  MRI  Restricted diffusion throughout the majority of the left MCA and ACA vascular territories consistent with acute ischemia.  MRA - reocclusion of the intracranial left internal carotid artery. No flow related signal is seen within the left middle cerebral artery.  CT Head 07/04/19 - Slightly increased rightward midline shift with a small amount of herniation beneath the anterior falx. Otherwise unchanged examination.  CT  repeat 5/6 stable infarcts and SAH but increasing edema w/ midline shift now 91mm and herniation through crani site  CT repeat 5/21 decreased shift 5->85mm. Extensive L crani w/ buldging through defect. Still L ACA and MCA low density w/ petechial hemorrhage but no large hemorrhage.   2D Echo - EF 55 - 60%. No cardiac source of emboli identified.   LDL 45  HgbA1c 5.5  Subq heparin for VTE prophylaxis  No antithrombotic prior to admission, now on Eliquis.  Therapy recommendations:  SNF  Disposition:  Pending - LTACH consulted however no insurance   Respiratory failure s/p Trach Trach wound  Intubated on vent  CCM on board  CXR 07/03/19- New consolidation in the right lower lobe and left infrahilar region concerning for pneumonia. Possible small layering right effusion.  CXR 5/3 mild pulm edema w/ atx vs infiltrate/aspiration  On cefepime 4/30>>5/2 for pneumonia  Augmentin 5/2>>5/4  Unasyn 5/5>>5/6  Trach placed 5/8 per CCM  WOC consulted for trach wound ->progressed to stage 3 pressure, for dressing change q3d and prn  Fever and elevated white count and tachycardia hence restarted on Vanc & Cefepime 5/11>>5/13  Ancef 5/14>>5/17  (pneumonia)  On trach collar since 5/14 w/ high FiO2 requirements  On progressive floor  On 28% ATC  downsized to cuffless trach 6 by CCM 5/24  Cerebral edema Hypernatremia   MRI - Restricted diffusion throughout the majority of the left MCA and ACA vascular territories consistent with acute ischemia.  CT Head 07/04/19 - Slightly increased rightward midline shift with a small amount of herniation beneath the anterior falx. Otherwise unchanged examination.  S/p decompressive left hemicraniectomy  4/28 Dr Christella Noa  CT 5/6 - stable infarcts and SAH but increasing edema w/ midline shift now 31mm and herniation through crani site  CT head 5/20 - decreased shift 5->100mm. Extensive L crani w/ buldging through defect. Still L ACA and MCA low  density w/ petechial hemorrhage but no large hemorrhage.  completed keppra 2 wk course per NSG  Off 3% and D5NS  Okay to allow Na gradually normalize  Na normalized   Crani sutures out -> suture incision leaking 5/16 - s/p local suture  Sepsis, resolved Fever w/ leukocytosis, resolved HCAP, resolved  Afebrile  Leukocytosis - 12.5  UA neg  Sputum moraxella, H. flu   On cefepime 4/30>>5/2; Augmentin 5/2>>5/4, Unasyn 5/5>>5/6 -> off  Blood culture 5/2 no growth  Blood cultures 5/7 - no growth   Resp cultures 5/7 - no growth  CXR 5/9 - streaky BLL opacilites atx/consolication/PNA. Possible R pleural effusion  CXR 5/10 w/ atx vs infiltrate  CXR 07/15/19 - Stable bibasilar atelectasis a small right pleural effusion. No new opacity evident. Stable cardiac silhouette  Sputum cx 5/11 reincubated   Blood Cx 5/11 no growth   Vanc 5/11>>5/13, Cefepime 5/11>>5/13  Ancef 5/14>>5/17  (pneumonia)  CXR 5/18 slight improvement in bibasilar atx  Bilateral Lower  Ext DVT, extensive Possible PE given tachycardia  Venous US 07/11/2019 DVT + bilateral extensive  IVC filter placed 5/9  On eliquis now for DVT prophylaxis  Hypertension  Home meds:  None  Current meds: none scheduled  BP stable  SBP goal < 160 due to hemorrhagic conversion . Long-term BP goal normotensive  Dysphagia   NPO  Coretrak 5/7  PEG placement 5/14  On TF @ 55  FW 250 Q4h -> 200 Q6  Transitioned to bolus feeding  Ileus, resolved  Abd distension w/ decreased BS  KUB 5/2 distended colonic loop  Miralax   Flexiseal following laxatives  On reglan, laxatives on hold, decrease fentanyl  Coretrak 5/7  PEG 5/14  TF resumed after PEG  Acute Renal Failure, resolved  Renal US - increased cortical echogenicity in B kidneys c/w renal dz. Increased echogenicity of liver ?hepatic steatosis.  Urine studies - elevated protein creatinine ratio, Na normal, UN normal  Creatinine -  0.85  Diuresis - per CCM  Tobacco abuse  Current smoker  Smoking cessation counseling will be provided if able  Other Stroke Risk Factors  Obesity, Body mass index is 29 kg/m., recommend weight loss, diet and exercise as appropriate    Likely undiagnosed obstructive sleep apnea based on body habitus  Other Active Problems  Hypokalemia - resolved   Thrombocytopenia - resolved  Elevated LFT - AST/ALT 54/102, improved    Constipation. Miralax daily -> loose stool -> miralax discontinued  Charm trial for cytotoxic edema  Mild anemia - Hb - 12.6  Hospital day # 26  Continue ongoing medical management.  Plan to increase metoprolol dose and continue ongoing tracheostomy care.  Hopefully downsize trach to a cuffless tube over the next few days to facilitate transfer to skilled nursing facility.  Discussed with social worker and care team.  Greater than 50% time during this 25-minute visit was spent in counseling and coordination of care and discussion with care team Delia Heady, MD Stroke Neurology 07/27/2019 5:03 PM   To contact Stroke Continuity provider, please refer to [email or web address]. After hours, contact General Neurology

## 2019-07-27 NOTE — Consult Note (Signed)
WOC Nurse wound follow up Wound type: Stage 3 pressure injury to right neck beneath trach faceplate Measurement: 0.3cm x 1cm x 0.2cm Wound QIO:NGEX, moist, granulating Drainage (amount, consistency, odor) Light yellow exudate, small amount, no odor Periwound: Intact, dry Dressing procedure/placement/frequency: Topical dressing (DrawTex) is in place and is wicking and supporting tissue repair in addition to padding the trach faceplate.  WOC nursing team will follow, seeing every 7-10 days for follow up on this wound and will remain available to this patient, the nursing and medical teams.  Please reconsult if needed in between visits.  Thanks, Ladona Mow, MSN, RN, GNP, [facility]  Pager# [phone]

## 2019-07-27 NOTE — Progress Notes (Signed)
Occupational Therapy Treatment Patient Details Name: Lee Hooper MRN: [id number] DOB: 03/05/1971 Today's Date: 07/27/2019    History of present illness This 48 y.o. male admitted 4/28 with Rt sided hemiplegia and aphasia.  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  Repeat CT following procedure showed moderate basal ganglia hemorrhage vs. stroke   Repeat MRI revealed that the ICA and MCA re-occluded with change in FLAIR sequence excluding him from repeat thrombectomy.  He underwent a decompressive hemicraniectomy.  He waws intubated 4/28.  He developed an ileus 5/2. Trach 5/8. IVC filter placed 5/9.  Peg placement.  PMH includes: COPD, tobacco abuse, psoriasis   OT comments  Patient supine in bed, seen for OT/PT cotx.  Patient on 28% FiO2 5L with VSS during session. Remains limited by R hemiparesis, impaired balance (pushers and R lat/posterior lean), L gaze, and impaired cognition.  He demonstrates ability to track granddaughters picture x 1 slightly past midline to R side with increased time and cueing.  Engaged in grooming with max assist to wash face, total assist for thoroughness.  Provided PROM of R UE, AAROM head turns at EOB, and scapular retraction at EOB to reduce trunk stiffness from lack of mobilization.  Follows minimal cues with multimodal cueing and no distractions. Patient requires +2 for all bed/EOB mobility.  Goals and SNF remain appropriate.  Will follow.    Follow Up Recommendations  SNF;Supervision/Assistance - 24 hour    Equipment Recommendations  Other (comment)(TBD at next venue of care)    Recommendations for Other Services      Precautions / Restrictions Precautions Precautions: Fall Precaution Comments: R hemi, trach, PEG, flexiseal; No Bone flap L head;crani Restrictions Weight Bearing Restrictions: No       Mobility Bed Mobility Overal bed mobility: Needs Assistance Bed Mobility: Rolling;Sidelying to Sit;Sit to Supine Rolling: Total  assist;+2 for physical assistance Sidelying to sit: Total assist;+2 for physical assistance Supine to sit: Total assist;+2 for physical assistance;+2 for safety/equipment     General bed mobility comments: hand over hand assist for L UE to reach for rail, but unable to purposefully use to assist  Transfers                 General transfer comment: deferred    Balance Overall balance assessment: Needs assistance Sitting-balance support: Feet supported;No upper extremity supported;Single extremity supported Sitting balance-Leahy Scale: Poor Sitting balance - Comments: pushing with L UE towards R, redirected with L UE on pillows, posterior and R lateral lean  Postural control: Posterior lean;Right lateral lean                                 ADL either performed or assessed with clinical judgement   ADL Overall ADL's : Needs assistance/impaired     Grooming: Maximal assistance;Sitting Grooming Details (indicate cue type and reason): L UE hand over hand assist to wash mouth/face, total assist for thoroughness                               General ADL Comments: total A with ADL     Vision   Vision Assessment?: Vision impaired- to be further tested in functional context Additional Comments: cueing to maintain eyes open, L gaze and tracks minimally past midline x 1 with picture of granddaughter    Perception     Praxis  Cognition Arousal/Alertness: Lethargic Behavior During Therapy: Flat affect;Impulsive Overall Cognitive Status: Impaired/Different from baseline Area of Impairment: Attention;Following commands;Safety/judgement;Awareness;Problem solving                   Current Attention Level: Focused   Following Commands: Follows one step commands inconsistently Safety/Judgement: Decreased awareness of safety;Decreased awareness of deficits Awareness: Intellectual Problem Solving: Slow processing;Decreased  initiation;Difficulty sequencing;Requires verbal cues;Requires tactile cues General Comments: pt able to follow simple commands with increased time, no distractions for short bouts. lethargic and max cueing to maintain eyes open         Exercises Exercises: General Upper Extremity General Exercises - Upper Extremity Shoulder Flexion: PROM;Right;5 reps Shoulder Extension: PROM;Right;5 reps Shoulder ABduction: PROM;Right;5 reps Elbow Flexion: PROM;Right;5 reps Elbow Extension: PROM;Right;5 reps Wrist Flexion: PROM;Right;5 reps Wrist Extension: PROM;Right;5 reps Digit Composite Flexion: PROM;Right;5 reps Composite Extension: PROM;Right;5 reps Other Exercises Other Exercises: PROM of R supniation/pronation x 5 reps  Other Exercises: seated head turns with max-total assist, chest opening and stretch into retraction total assist    Shoulder Instructions       General Comments VSS, on 28% FiO2 5L     Pertinent Vitals/ Pain       Pain Assessment: Faces Faces Pain Scale: No hurt  Home Living                                          Prior Functioning/Environment              Frequency  Min 2X/week        Progress Toward Goals  OT Goals(current goals can now be found in the care plan section)  Progress towards OT goals: Progressing toward goals  Acute Rehab OT Goals Patient Stated Goal: none stated by patient OT Goal Formulation: Patient unable to participate in goal setting Time For Goal Achievement: 08/04/19 Potential to Achieve Goals: Fair  Plan Discharge plan remains appropriate;Frequency remains appropriate    Co-evaluation    PT/OT/SLP Co-Evaluation/Treatment: Yes Reason for Co-Treatment: Complexity of the patient's impairments (multi-system involvement);Necessary to address cognition/behavior during functional activity;For patient/therapist safety;To address functional/ADL transfers   OT goals addressed during session: ADL's and  self-care      AM-PAC OT "6 Clicks" Daily Activity     Outcome Measure   Help from another person eating meals?: Total Help from another person taking care of personal grooming?: A Lot Help from another person toileting, which includes using toliet, bedpan, or urinal?: Total Help from another person bathing (including washing, rinsing, drying)?: Total Help from another person to put on and taking off regular upper body clothing?: Total Help from another person to put on and taking off regular lower body clothing?: Total 6 Click Score: 7    End of Session Equipment Utilized During Treatment: Oxygen(trach collar )  OT Visit Diagnosis: Unsteadiness on feet (R26.81);Cognitive communication deficit (R41.841);Hemiplegia and hemiparesis;Muscle weakness (generalized) (M62.81);Apraxia (R48.2);Other symptoms and signs involving cognitive function;Pain Symptoms and signs involving cognitive functions: Cerebral infarction Hemiplegia - Right/Left: Right Hemiplegia - dominant/non-dominant: Dominant Hemiplegia - caused by: Cerebral infarction   Activity Tolerance Patient limited by lethargy   Patient Left in bed;with call bell/phone within reach;with bed alarm set   Nurse Communication Mobility status        Time: 5284-1324 OT Time Calculation (min): 27 min  Charges: OT General Charges $OT Visit: 1 Visit OT Treatments $Self  Care/Home Management : 8-22 mins  Barry Brunner, [place] [facility] Pager [phone] Office [phone]    Chancy Milroy 07/27/2019, 1:31 PM

## 2019-07-28 LAB — CBC
HCT: 37.7 % — ABNORMAL LOW (ref 39.0–52.0)
Hemoglobin: 11.8 g/dL — ABNORMAL LOW (ref 13.0–17.0)
MCH: 29.4 pg (ref 26.0–34.0)
MCHC: 31.3 g/dL (ref 30.0–36.0)
MCV: 94 fL (ref 80.0–100.0)
Platelets: 176 10*3/uL (ref 150–400)
RBC: 4.01 MIL/uL — ABNORMAL LOW (ref 4.22–5.81)
RDW: 16.4 % — ABNORMAL HIGH (ref 11.5–15.5)
WBC: 10 10*3/uL (ref 4.0–10.5)
nRBC: 0.3 % — ABNORMAL HIGH (ref 0.0–0.2)

## 2019-07-28 LAB — BASIC METABOLIC PANEL
Anion gap: 8 (ref 5–15)
BUN: 20 mg/dL (ref 6–20)
CO2: 26 mmol/L (ref 22–32)
Calcium: 8.4 mg/dL — ABNORMAL LOW (ref 8.9–10.3)
Chloride: 103 mmol/L (ref 98–111)
Creatinine, Ser: 0.82 mg/dL (ref 0.61–1.24)
GFR calc Af Amer: >60 mL/min (ref >60)
GFR calc non Af Amer: >60 mL/min (ref >60)
Glucose, Bld: 120 mg/dL — ABNORMAL HIGH (ref 70–99)
Potassium: 4.4 mmol/L (ref 3.5–5.1)
Sodium: 137 mmol/L (ref 135–145)

## 2019-07-28 LAB — GLUCOSE, CAPILLARY
Glucose-Capillary: 118 mg/dL — ABNORMAL HIGH (ref 70–99)
Glucose-Capillary: 119 mg/dL — ABNORMAL HIGH (ref 70–99)
Glucose-Capillary: 142 mg/dL — ABNORMAL HIGH (ref 70–99)
Glucose-Capillary: 182 mg/dL — ABNORMAL HIGH (ref 70–99)
Glucose-Capillary: 81 mg/dL (ref 70–99)
Glucose-Capillary: 91 mg/dL (ref 70–99)

## 2019-07-28 LAB — PROCALCITONIN: Procalcitonin: 0.51 ng/mL

## 2019-07-28 MED ORDER — WHITE PETROLATUM EX OINT
TOPICAL_OINTMENT | CUTANEOUS | Status: AC
  Filled 2019-07-28: qty 28.35

## 2019-07-28 MED ORDER — SODIUM CHLORIDE 0.9 % IV SOLN
INTRAVENOUS | Status: DC
  Administered 2019-07-30: 1000 mL via INTRAVENOUS

## 2019-07-28 MED ORDER — IPRATROPIUM-ALBUTEROL 0.5-2.5 (3) MG/3ML IN SOLN: 3.0000 mL | RESPIRATORY_TRACT | Status: DC | PRN

## 2019-07-28 NOTE — Plan of Care (Signed)
  Problem: Coping: Goal: Will verbalize positive feelings about self Outcome: Progressing   Problem: Activity: Goal: Risk for activity intolerance will decrease Outcome: Progressing   Problem: Coping: Goal: Level of anxiety will decrease Outcome: Progressing

## 2019-07-28 NOTE — Progress Notes (Signed)
STROKE TEAM PROGRESS NOTE   INTERVAL HISTORY Patient had some leukocytosis, fever and tachycardia last night and was started on cefepime and vancomycin by Dr. Leonides Schanz and blood cultures were sent.  Chest x-ray showed increased volume loss in the right lung base with streaky opacity favoring atelectasis.  Urinalysis is pending.  Procalcitonin levels were normal.  This morning the patient neurologically unchanged remains aphasic but easily arousable.  Has purposeful movements on the left and remains plegic on the right.  Vitals:   07/28/19 0751 07/28/19 0839 07/28/19 1017 07/28/19 1130  BP:  103/75 116/69   Pulse:  100 94   Resp:  18    Temp: 98.8 F (37.1 C)   98.7 F (37.1 C)  TempSrc:      SpO2:  94%    Weight:      Height:       CBC:  Recent Labs  Lab 07/26/19 0402 07/27/19 0434  WBC 11.6* 12.5*  HGB 11.4* 12.6*  HCT 37.0* 40.4  MCV 94.4 94.6  PLT 195 517   Basic Metabolic Panel:  Recent Labs  Lab 07/26/19 0402 07/27/19 0434  NA 138 136  K 4.5 4.4  CL 101 101  CO2 27 26  GLUCOSE 119* 144*  BUN 23* 20  CREATININE 0.72 0.85  CALCIUM 8.9 8.9    IMAGING past 24 hours DG CHEST PORT 1 VIEW  Result Date: 07/27/2019 CLINICAL DATA:  Elevated temperature. EXAM: PORTABLE CHEST 1 VIEW COMPARISON:  Radiograph 07/21/2019 FINDINGS: Tracheostomy tube tip at the thoracic inlet. Increased volume loss at the right lung base with streaky opacities. Minor left lung base atelectasis. Heart is normal in size. Normal mediastinal contours for rotation. No pleural effusion or pneumothorax. No pulmonary edema. IMPRESSION: 1. Increased volume loss at the right lung base with streaky opacities, favor worsening atelectasis. Pneumonia is also considered in the setting of elevated temperature. 2. Minor left lung base atelectasis, with improvement from prior exam. Electronically Signed   By: Keith Rake M.D.   On: 07/27/2019 23:16    PHYSICAL EXAM       General - mildly obese middle-aged  Caucasian male, s/p tracheostomy on trach collar  Ophthalmologic - fundi not visualized due to noncooperation.  Cardiovascular - Regular rate and rhythm.  Neuro - s/p tracheostomy on trach collar. No sedation, eyes closed but open with voice. Still not following any simple commands. left gaze preference position, barely cross midline, not blinking to visual threat on the left, doll's eyes present, pupil left 30mm, right 4.62mm, both reactive to light. Corneal reflex weak on the right and brisk on the left, gag and cough present. Breathing over the vent.  right facial droop.  Tongue protrusion not cooperative. Spontaneous and purposeful movement left UE 4/5 and at least LE 2+/5. However, right UE and LE plegic. DTR 1+ and no babinski. Sensation, coordination and gait not tested.    ASSESSMENT/PLAN Lee Hooper is a 49 y.o. male with history of COPD, ongoing tobacco use, and psoriasis, presenting with dense LMCA syndrome. Attempt for Left ICA and Left M1 thrombectomy was done emergently with TICI 2b flow. Unfortunately, the pt had post procedure hemorrhage and reocclusion of the vessels. F/u imaging suggested early edema and pt underwent prophylactic decompressive hemicraniectomy.  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  Stroke etiology unknown - given DVT requiring eventual AC and high mRS, will not do further embolic workup  Code Stroke CT head showed early hypodensity in nearly entire LMCA. ASPECTS 6-7 per notes.  CTA head & neck occlusion cervical ICA on the left. This is most likely due to dissection. The patient has minimal atherosclerotic disease.  The left internal carotid artery is occluded through the terminus and extending  into the left M1 M2 and M3 branches.  There is poor collateral circulation on the left.   CTP CBF large core volume of 266 mL, perfusion volume 313 mL, mismatch volume 47 mL.  ASPECTS using CTP by Dr. Chestine Spore 6 or 7.   IR - TICI2c revascularization  MRI  Restricted diffusion throughout the majority of the left MCA and ACA vascular territories consistent with acute ischemia.  MRA - reocclusion of the intracranial left internal carotid artery. No flow related signal is seen within the left middle cerebral artery.  CT Head 07/04/19 - Slightly increased rightward midline shift with a small amount of herniation beneath the anterior falx. Otherwise unchanged examination.  CT repeat 5/6 stable infarcts and SAH but increasing edema w/ midline shift now 50mm and herniation through crani site  CT repeat 5/21 decreased shift 5->55mm. Extensive L crani w/ buldging through defect. Still L ACA and MCA low density w/ petechial hemorrhage but no large hemorrhage.   2D Echo - EF 55 - 60%. No cardiac source of emboli identified.   LDL 45  HgbA1c 5.5  Subq heparin for VTE prophylaxis  No antithrombotic prior to admission, now on Eliquis.  Therapy recommendations:  SNF  Disposition:  Pending - LTACH consulted however no insurance   Respiratory failure s/p Trach Trach wound  Intubated on vent  CCM on board  CXR 07/03/19- New consolidation in the right lower lobe and left infrahilar region concerning for pneumonia. Possible small layering right effusion.  CXR 5/3 mild pulm edema w/ atx vs infiltrate/aspiration  On cefepime 4/30>>5/2 for pneumonia  Augmentin 5/2>>5/4  Unasyn 5/5>>5/6  Trach placed 5/8 per CCM  WOC consulted for trach wound ->progressed to stage 3 pressure, for dressing change q3d and prn  Fever and elevated white count and tachycardia hence restarted on Vanc & Cefepime 5/11>>5/13  Ancef 5/14>>5/17  (pneumonia)  On trach collar since 5/14 w/ high FiO2 requirements  On  progressive floor  On 28% ATC  downsized to cuffless trach 6 by CCM 5/24  Cerebral edema Hypernatremia   MRI - Restricted diffusion throughout the majority of the left MCA and ACA vascular territories consistent with acute ischemia.  CT Head 07/04/19 - Slightly increased rightward midline shift with a small amount of herniation beneath the anterior falx. Otherwise unchanged examination.  S/p decompressive left hemicraniectomy  4/28 Dr Franky Macho  CT 5/6 - stable infarcts and SAH but increasing edema w/ midline shift now 86mm and herniation through crani site  CT head 5/20 - decreased shift 5->69mm. Extensive L crani w/ buldging through defect. Still L ACA and MCA low density w/ petechial hemorrhage but no large hemorrhage.  completed keppra 2 wk course per NSG  Off 3% and D5NS  Okay to allow Na gradually normalize  Na normalized   Crani sutures out -> suture incision leaking 5/16 - s/p local suture  Sepsis, resolved Fever w/ leukocytosis HCAP, resolved, ? Recurrence Hypotension  TMax 102.7  Leukocytosis - 12.5  UA neg  Sputum moraxella, H. flu   On cefepime 4/30>>5/2; Augmentin 5/2>>5/4, Unasyn 5/5>>5/6 -> off  Blood culture 5/2 no growth  Blood cultures 5/7 - no growth   Resp cultures 5/7 - no growth  CXR 5/9 - streaky BLL opacilites atx/consolication/PNA. Possible R pleural effusion  CXR 5/10 w/ atx vs infiltrate  CXR 07/15/19 - Stable bibasilar atelectasis a small right pleural effusion. No new opacity evident. Stable cardiac silhouette  Sputum cx 5/11 reincubated   Blood Cx 5/11 no growth   Vanc 5/11>>5/13, Cefepime 5/11>>5/13  Ancef 5/14>>5/17  (pneumonia)  CXR 5/18 slight improvement in bibasilar atx  CXR 5/24 increased vol loss RLL w/ streaky opacities, worsening atx, ? PNA. Minor LLL atx.  Vanc 5/24>>  Cefepime 5/24>>  UA pending   Repeat CXR scheduled for tonight  Contacted CCM to see today  Bilateral Lower Ext DVT, extensive Possible PE  given tachycardia  Venous US 07/11/2019 DVT + bilateral extensive  IVC filter placed 5/9  On eliquis now for DVT prophylaxis  Hypertension  Home meds:  None  Current meds: none scheduled  SBP goal < 160 due to hemorrhagic conversion . Long-term BP goal normotensive  Dysphagia   NPO  Coretrak 5/7  PEG placement 5/14  On TF @ 55  FW 150 Q4h; NS @ 100h   Transitioned to bolus feeding  Ileus, resolved  Abd distension w/ decreased BS  KUB 5/2 distended colonic loop  Miralax   Flexiseal following laxatives  On reglan, laxatives on hold, decrease fentanyl  Coretrak 5/7  PEG 5/14  TF resumed after PEG  Acute Renal Failure, resolved  Renal US - increased cortical echogenicity in B kidneys c/w renal dz. Increased echogenicity of liver ?hepatic steatosis.  Urine studies - elevated protein creatinine ratio, Na normal, UN normal  Creatinine - 0.85  Diuresis - per CCM  Tobacco abuse  Current smoker  Smoking cessation counseling will be provided if able  Other Stroke Risk Factors  Obesity, Body mass index is 28.91 kg/m., recommend weight loss, diet and exercise as appropriate    Likely undiagnosed obstructive sleep apnea based on body habitus  Other Active Problems  Hypokalemia - resolved   Thrombocytopenia - resolved  Elevated LFT - AST/ALT 54/102, improved    Constipation. Miralax daily -> loose stool -> miralax discontinued  Charm trial for cytotoxic edema  Mild anemia - Hb - 12.6  Hospital day # 27  Continue vancomycin and cefepime until blood culture results and urinalysis results come back.  Will last critical care team to weigh in on management of antibiotics and fever.  Discussed with patient's wife and gave her an update.  Greater than 50% time during this 25-minute visit was spent in counseling and coordination of care and discussion with care team Delia Heady, MD Stroke Neurology 07/28/2019 2:17 PM   To contact Stroke Continuity  provider, please refer to [email or web address]. After hours, contact General Neurology

## 2019-07-28 NOTE — Progress Notes (Signed)
Temp 102.7 Dr. Amada Jupiter was made aware. Tylenol 500mg  was given. per peg tube. 

## 2019-07-28 NOTE — Plan of Care (Signed)
°  Problem: Clinical Measurements: °Goal: Ability to maintain clinical measurements within normal limits will improve °Outcome: Not Progressing °  °Problem: Clinical Measurements: °Goal: Cardiovascular complication will be avoided °Outcome: Not Progressing °  °

## 2019-07-28 NOTE — Progress Notes (Signed)
Pt temp= 101.7.  Dr. Amada Jupiter was made aware. Pt was given tylenol 650mg  Per peg tube. Blood cultures obtained. CXR completed.

## 2019-07-28 NOTE — Progress Notes (Signed)
Pt BP=91/54. Dr. Amada Jupiter was made aware. Pt was given Vancomycin 2g IV, followed by cefepime 2g IV. NS per hour was initiated.

## 2019-07-29 LAB — GLUCOSE, CAPILLARY
Glucose-Capillary: 100 mg/dL — ABNORMAL HIGH (ref 70–99)
Glucose-Capillary: 144 mg/dL — ABNORMAL HIGH (ref 70–99)
Glucose-Capillary: 165 mg/dL — ABNORMAL HIGH (ref 70–99)
Glucose-Capillary: 188 mg/dL — ABNORMAL HIGH (ref 70–99)
Glucose-Capillary: 194 mg/dL — ABNORMAL HIGH (ref 70–99)
Glucose-Capillary: 84 mg/dL (ref 70–99)
Glucose-Capillary: 95 mg/dL (ref 70–99)

## 2019-07-29 LAB — PROCALCITONIN: Procalcitonin: 0.46 ng/mL

## 2019-07-29 LAB — BASIC METABOLIC PANEL
Anion gap: 11 (ref 5–15)
BUN: 19 mg/dL (ref 6–20)
CO2: 24 mmol/L (ref 22–32)
Calcium: 8.5 mg/dL — ABNORMAL LOW (ref 8.9–10.3)
Chloride: 101 mmol/L (ref 98–111)
Creatinine, Ser: 0.83 mg/dL (ref 0.61–1.24)
GFR calc Af Amer: >60 mL/min (ref >60)
GFR calc non Af Amer: >60 mL/min (ref >60)
Glucose, Bld: 84 mg/dL (ref 70–99)
Potassium: 4.9 mmol/L (ref 3.5–5.1)
Sodium: 136 mmol/L (ref 135–145)

## 2019-07-29 LAB — CBC
HCT: 37.8 % — ABNORMAL LOW (ref 39.0–52.0)
Hemoglobin: 11.9 g/dL — ABNORMAL LOW (ref 13.0–17.0)
MCH: 29.9 pg (ref 26.0–34.0)
MCHC: 31.5 g/dL (ref 30.0–36.0)
MCV: 95 fL (ref 80.0–100.0)
Platelets: 172 10*3/uL (ref 150–400)
RBC: 3.98 MIL/uL — ABNORMAL LOW (ref 4.22–5.81)
RDW: 16.7 % — ABNORMAL HIGH (ref 11.5–15.5)
WBC: 10.5 10*3/uL (ref 4.0–10.5)
nRBC: 0 % (ref 0.0–0.2)

## 2019-07-29 MED ORDER — JUVEN PO PACK
1.0000 | PACK | Freq: Two times a day (BID) | ORAL | Status: DC
  Administered 2019-07-29 – 2019-08-28 (×59): 1
  Filled 2019-07-29 (×59): qty 1

## 2019-07-29 MED ORDER — METOPROLOL TARTRATE 25 MG/10 ML ORAL SUSPENSION
25.0000 mg | Freq: Two times a day (BID) | ORAL | Status: DC
  Administered 2019-07-29 – 2019-08-25 (×53): 25 mg
  Filled 2019-07-29 (×56): qty 10

## 2019-07-29 NOTE — Progress Notes (Signed)
Patient ID: Lee Hooper, male   DOB: 1970-12-13, 49 y.o.   MRN: [id number]  BP 121/80   Pulse (!) 122   Temp 98.7 F (37.1 C) (Axillary)   Resp (!) 26   Ht 6' (1.829 m)   Wt 97.6 kg   SpO2 96%   BMI 29.18 kg/m  No change neurologically, alert. Wound is dry, still with significant amount of material outside the skull Will order a ct in anticipation of cranioplasty.

## 2019-07-29 NOTE — Progress Notes (Signed)
Nutrition Follow-up  DOCUMENTATION CODES:   Obesity unspecified  INTERVENTION:  Continue bolus feeding via PEG: -2 cans Osmolite 1.5 cal (427m) TID  -366mPro-stat TID -15093mree water Q4H  Tube feeding regimen will provide 2430 kcals, 134 grams of protein, 1086m36mee water (1986ml51mal free water with flushes)   1 packet Juven BID via PEG, each packet provides 95 calories, 2.5 grams of protein (collagen), and 9.8 grams of carbohydrate (3 grams sugar); also contains 7 grams of L-arginine and L-glutamine, 300 mg vitamin C, 15 mg vitamin E, 1.2 mcg vitamin B-12, 9.5 mg zinc, 200 mg calcium, and 1.5 g  Calcium Beta-hydroxy-Beta-methylbutyrate to support wound healing   NUTRITION DIAGNOSIS:   Inadequate oral intake related to inability to eat as evidenced by NPO status.  Ongoing.  GOAL:   Patient will meet greater than or equal to 90% of their needs  Met with TF.   MONITOR:   TF tolerance  REASON FOR ASSESSMENT:   Consult, Ventilator Enteral/tube feeding initiation and management  ASSESSMENT:   Pt with PMH of smoking and COPD admitted with L ICA/MCA s/p IR for revascularization however pt had re-occlusion now s/p emergent L hemicraniectomy.  4/30 cortrak placed; tip gastric 5/2 TF held due to ileus 5/6 trickle TF started 5/8 trach placed; IVC filter placed 5/14 PEG placed  Pt remains aphasic per MD.  Discussed pt with RN. Per RN, pt tolerating tube feeding well.   Current TF via PEG: 474ml 16mlite 1.5 cal TID, 30ml P54mtat TID, 150ml fr74mater Q4H  UOP: 800ml x247mrs I/O: +14,702.7ml since13mmit  Labs reviewed. CBGs 194-84-95 151-83-43ns reviewed and include: MVI  Diet Order:   Diet Order            Diet NPO time specified  Diet effective midnight              EDUCATION NEEDS:   No education needs have been identified at this time  Skin:  Skin Assessment: Skin Integrity Issues: Skin Integrity Issues:: Stage III, Incisions Stage III:  R neck Incisions: head, R neck  Last BM:  5/24  Height:   Ht Readings from Last 1 Encounters:  07/22/19 6' (1.829 m)    Weight:   Wt Readings from Last 1 Encounters:  07/29/19 97.6 kg    Ideal Body Weight:  80.9 kg  BMI:  Body mass index is 29.18 kg/m.  Estimated Nutritional Needs:   Kcal:  2300-2500 [id number]  125-145 grams  Fluid:  2 L/day   Hazelle Woollard AveLarkin InaLDN RD pager number and weekend/on-call pager number located in [email or web address]

## 2019-07-29 NOTE — Progress Notes (Signed)
Pharmacy Antibiotic Note  Lee Hooper is a 49 y.o. male admitted on 07/01/2019 with a CVA s/p IR thrombectomy for left ICA and left M1 occlusions. Repeat CT after procedure was concerning for possible hemorrhage. Repeat MRI demonstrated that the ICA and MCA re-occluded.   Had been on abx previously - now restarting for concern for PNA (H. Flu, E Coli, Moraxella previously )  Scr stable, now afebrile   Plan: Continue Vancomycin 1 gram iv Q 8 hours - Consider stopping Continue Cefepime 2g IV q8 hours Monitor temp, WBC, Scr, clinical status, and cultures  Height: 6' (182.9 cm) Weight: 97.6 kg (215 lb 2.7 oz) IBW/kg (Calculated) : 77.6  Temp (24hrs), Avg:98.6 F (37 C), Min:98.3 F (36.8 C), Max:99 F (37.2 C)  Recent Labs  Lab 07/25/19 0259 07/26/19 0402 07/27/19 0434 07/28/19 1442 07/29/19 0408  WBC 11.5* 11.6* 12.5* 10.0 10.5  CREATININE 0.86 0.72 0.85 0.82 0.83    Estimated Creatinine Clearance: 128.9 mL/min (by C-G formula based on SCr of 0.83 mg/dL).    Allergies  Allergen Reactions  . Tegaderm Ag Mesh [Silver] Other (See Comments)    blisters    Antimicrobials this admission: Cefepime 4/30 >>5/2, 5/24>> Augmentin 5/2>>5/5 Unasyn 5/5 >> 5/6 Cefepime 5/11 >> 5/14 Vancomycin 5/11 >> 5/14, 5/24>> Cefazolin 5/14 >> 5/17  Microbiology results: 4/30 TA: m cat, h. Influenzae - beta lactamase + 5/2 UCx - negative 5/2 BCx - negative 5/7 Bcx - negative 5/7 TA - H.influenza + Moraxella 5/11 BCx - negative 5/11TA - E.coli (S Ancef, Cipro, Zosyn)  5/25 BC x 2 - NGTD  Thank you Okey Regal, PharmD Clinical Pharmacist  07/29/2019 9:26 AM  Please check AMION for all [facility] phone numbers After 10:00 PM, call Main Pharmacy [phone]

## 2019-07-29 NOTE — Progress Notes (Signed)
STROKE TEAM PROGRESS NOTE   INTERVAL HISTORY Patient looks little better.  He is afebrile.  Heart rate is better now in the low 100s.  White count is stable at 10.5.  Blood cultures no growth  Vitals:   07/29/19 0800 07/29/19 1113 07/29/19 1500 07/29/19 1542  BP: 110/79 125/73 126/80 130/78  Pulse: 99 (!) 104 (!) 102 (!) 105  Resp: (!) 23 20 (!) 23 (!) 21  Temp: 98.4 F (36.9 C) 98.4 F (36.9 C)  98.7 F (37.1 C)  TempSrc: Axillary Axillary  Axillary  SpO2: 96% 96% 95% 96%  Weight:      Height:       CBC:  Recent Labs  Lab 07/28/19 1442 07/29/19 0408  WBC 10.0 10.5  HGB 11.8* 11.9*  HCT 37.7* 37.8*  MCV 94.0 95.0  PLT 176 361   Basic Metabolic Panel:  Recent Labs  Lab 07/28/19 1442 07/29/19 0408  NA 137 136  K 4.4 4.9  CL 103 101  CO2 26 24  GLUCOSE 120* 84  BUN 20 19  CREATININE 0.82 0.83  CALCIUM 8.4* 8.5*    IMAGING past 24 hours No results found.  PHYSICAL EXAM       General - mildly obese middle-aged Caucasian male, s/p tracheostomy on trach collar  Ophthalmologic - fundi not visualized due to noncooperation.  Cardiovascular - Regular rate and rhythm.  Neuro - s/p tracheostomy on trach collar. No sedation, eyes closed but open with voice. Still not following any simple commands. left gaze preference position, barely cross midline, not blinking to visual threat on the left, doll's eyes present, pupil left 6mm, right 4.8mm, both reactive to light. Corneal reflex weak on the right and brisk on the left, gag and cough present. Breathing over the vent.  right facial droop.  Tongue protrusion not cooperative. Spontaneous and purposeful movement left UE 4/5 and at least LE 2+/5. However, right UE and LE plegic. DTR 1+ and no babinski. Sensation, coordination and gait not tested.    ASSESSMENT/PLAN Mr. HESTON WIDENER is a 49 y.o. male with history of COPD, ongoing tobacco use, and psoriasis, presenting with dense LMCA syndrome. Attempt for Left ICA and  Left M1 thrombectomy was done emergently with TICI 2b flow. Unfortunately, the pt had post procedure hemorrhage and reocclusion of the vessels. F/u imaging suggested early edema and pt underwent prophylactic decompressive hemicraniectomy.  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  Stroke etiology unknown - given DVT requiring eventual AC and high mRS, will not do further embolic workup  Code Stroke CT head showed early hypodensity in nearly entire LMCA. ASPECTS 6-7 per notes.  CTA head & neck occlusion cervical ICA on the left. This is most likely due to dissection. The patient has minimal atherosclerotic disease.  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  There is poor collateral circulation on the left.   CTP CBF large core volume of 266 mL, perfusion volume 313 mL, mismatch volume 47 mL.  ASPECTS using CTP by Dr. Carlis Abbott 6 or 7.   IR - TICI2c revascularization  MRI  Restricted diffusion throughout the majority of the left MCA and ACA vascular  territories consistent with acute ischemia.  MRA - reocclusion of the intracranial left internal carotid artery. No flow related signal is seen within the left middle cerebral artery.  CT Head 07/04/19 - Slightly increased rightward midline shift with a small amount of herniation beneath the anterior falx. Otherwise unchanged examination.  CT repeat 5/6 stable infarcts and SAH but increasing edema w/ midline shift now 1mm and herniation through crani site  CT repeat 5/21 decreased shift 5->9mm. Extensive L crani w/ buldging through defect. Still L ACA and MCA low density w/ petechial hemorrhage but no large hemorrhage.   2D  Echo - EF 55 - 60%. No cardiac source of emboli identified.   LDL 45  HgbA1c 5.5  Subq heparin for VTE prophylaxis  No antithrombotic prior to admission, now on Eliquis.  Therapy recommendations:  SNF  Disposition:  Pending - LTACH consulted however no insurance   Respiratory failure s/p Trach Trach wound  Intubated on vent  CCM on board  CXR 07/03/19- New consolidation in the right lower lobe and left infrahilar region concerning for pneumonia. Possible small layering right effusion.  CXR 5/3 mild pulm edema w/ atx vs infiltrate/aspiration  On cefepime 4/30>>5/2 for pneumonia  Augmentin 5/2>>5/4  Unasyn 5/5>>5/6  Trach placed 5/8 per CCM  WOC consulted for trach wound ->progressed to stage 3 pressure, for dressing change q3d and prn  Fever and elevated white count and tachycardia hence restarted on Vanc & Cefepime 5/11>>5/13  Ancef 5/14>>5/17  (pneumonia)  On trach collar since 5/14 w/ high FiO2 requirements  On progressive floor  On 28% ATC  downsized to cuffless trach 6 by CCM 5/24  Cerebral edema Hypernatremia   MRI - Restricted diffusion throughout the majority of the left MCA and ACA vascular territories consistent with acute ischemia.  CT Head 07/04/19 - Slightly increased rightward midline shift with a small amount of herniation beneath the anterior falx. Otherwise unchanged examination.  S/p decompressive left hemicraniectomy  4/28 Dr Franky Macho  CT 5/6 - stable infarcts and SAH but increasing edema w/ midline shift now 84mm and herniation through crani site  CT head 5/20 - decreased shift 5->74mm. Extensive L crani w/ buldging through defect. Still L ACA and MCA low density w/ petechial hemorrhage but no large hemorrhage.  completed keppra 2 wk course per NSG  Off 3% and D5NS  Okay to allow Na gradually normalize  Na normalized   Crani sutures out -> suture incision leaking 5/16 - s/p local suture  Sepsis, resolved Fever w/ leukocytosis HCAP,  resolved, ? Recurrence Hypotension  TMax 102.7  Leukocytosis - 12.5  UA neg  Sputum moraxella, H. flu   On cefepime 4/30>>5/2; Augmentin 5/2>>5/4, Unasyn 5/5>>5/6 -> off  Blood culture 5/2 no growth  Blood cultures 5/7 - no growth   Resp cultures 5/7 - no growth  CXR 5/9 - streaky BLL opacilites atx/consolication/PNA. Possible R pleural effusion  CXR 5/10 w/ atx vs infiltrate  CXR 07/15/19 - Stable bibasilar atelectasis a small right pleural effusion. No new opacity evident. Stable cardiac silhouette  Sputum cx 5/11 reincubated   Blood Cx 5/11 no growth   Vanc 5/11>>5/13, Cefepime 5/11>>5/13  Ancef 5/14>>5/17  (pneumonia)  CXR 5/18 slight improvement in bibasilar atx  CXR 5/24 increased vol loss RLL w/ streaky opacities, worsening atx, ? PNA. Minor LLL atx.  Vanc 5/24>>  Cefepime 5/24>>  UA pending   Repeat CXR scheduled for tonight  Contacted CCM to see today  Bilateral Lower Ext DVT, extensive  Possible PE given tachycardia  Venous US 07/11/2019 DVT + bilateral extensive  IVC filter placed 5/9  On eliquis now for DVT prophylaxis  Hypertension  Home meds:  None  Current meds: none scheduled  SBP goal < 160 due to hemorrhagic conversion . Long-term BP goal normotensive  Dysphagia   NPO  Coretrak 5/7  PEG placement 5/14  On TF @ 55  FW 150 Q4h; NS @ 100h   Transitioned to bolus feeding  Ileus, resolved  Abd distension w/ decreased BS  KUB 5/2 distended colonic loop  Miralax   Flexiseal following laxatives  On reglan, laxatives on hold, decrease fentanyl  Coretrak 5/7  PEG 5/14  TF resumed after PEG  Acute Renal Failure, resolved  Renal US - increased cortical echogenicity in B kidneys c/w renal dz. Increased echogenicity of liver ?hepatic steatosis.  Urine studies - elevated protein creatinine ratio, Na normal, UN normal  Creatinine - 0.85  Diuresis - per CCM  Tobacco abuse  Current smoker  Smoking cessation  counseling will be provided if able  Other Stroke Risk Factors  Obesity, Body mass index is 29.18 kg/m., recommend weight loss, diet and exercise as appropriate    Likely undiagnosed obstructive sleep apnea based on body habitus  Other Active Problems  Hypokalemia - resolved   Thrombocytopenia - resolved  Elevated LFT - AST/ALT 54/102, improved    Constipation. Miralax daily -> loose stool -> miralax discontinued  Charm trial for cytotoxic edema  Mild anemia - Hb - 12.6  Hospital day # 28  Continue vancomycin and cefepime continue ongoing care.  Await nursing home bed availability.  Discussed with case Production designer, theatre/television/film.  Greater than 50% time during this 25-minute visit was spent in counseling and coordination of care and discussion with care team Delia Heady, MD Stroke Neurology 07/29/2019 4:02 PM   To contact Stroke Continuity provider, please refer to [email or web address]. After hours, contact General Neurology

## 2019-07-30 LAB — GLUCOSE, CAPILLARY
Glucose-Capillary: 102 mg/dL — ABNORMAL HIGH (ref 70–99)
Glucose-Capillary: 109 mg/dL — ABNORMAL HIGH (ref 70–99)
Glucose-Capillary: 132 mg/dL — ABNORMAL HIGH (ref 70–99)
Glucose-Capillary: 177 mg/dL — ABNORMAL HIGH (ref 70–99)
Glucose-Capillary: 187 mg/dL — ABNORMAL HIGH (ref 70–99)
Glucose-Capillary: 91 mg/dL (ref 70–99)

## 2019-07-30 LAB — BASIC METABOLIC PANEL
Anion gap: 8 (ref 5–15)
BUN: 22 mg/dL — ABNORMAL HIGH (ref 6–20)
CO2: 25 mmol/L (ref 22–32)
Calcium: 8.5 mg/dL — ABNORMAL LOW (ref 8.9–10.3)
Chloride: 102 mmol/L (ref 98–111)
Creatinine, Ser: 0.78 mg/dL (ref 0.61–1.24)
GFR calc Af Amer: >60 mL/min (ref >60)
GFR calc non Af Amer: >60 mL/min (ref >60)
Glucose, Bld: 137 mg/dL — ABNORMAL HIGH (ref 70–99)
Potassium: 4.1 mmol/L (ref 3.5–5.1)
Sodium: 135 mmol/L (ref 135–145)

## 2019-07-30 LAB — CBC
HCT: 36 % — ABNORMAL LOW (ref 39.0–52.0)
Hemoglobin: 11.3 g/dL — ABNORMAL LOW (ref 13.0–17.0)
MCH: 29.7 pg (ref 26.0–34.0)
MCHC: 31.4 g/dL (ref 30.0–36.0)
MCV: 94.7 fL (ref 80.0–100.0)
Platelets: 168 10*3/uL (ref 150–400)
RBC: 3.8 MIL/uL — ABNORMAL LOW (ref 4.22–5.81)
RDW: 16.9 % — ABNORMAL HIGH (ref 11.5–15.5)
WBC: 8.9 10*3/uL (ref 4.0–10.5)
nRBC: 0 % (ref 0.0–0.2)

## 2019-07-30 NOTE — Progress Notes (Signed)
Physical Therapy Treatment Patient Details Name: Lee Hooper MRN: [id number] DOB: Sep 12, 1970 Today's Date: 07/30/2019    History of Present Illness This 49 y.o. male admitted 4/28 with Rt sided hemiplegia and aphasia.  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  Repeat CT following procedure showed moderate basal ganglia hemorrhage vs. stroke   Repeat MRI revealed that the ICA and MCA re-occluded with change in FLAIR sequence excluding him from repeat thrombectomy.  He underwent a decompressive hemicraniectomy.  He waws intubated 4/28.  He developed an ileus 5/2. Trach 5/8. IVC filter placed 5/9.  Peg placement.  PMH includes: COPD, tobacco abuse, psoriasis    PT Comments    Patient seen for mobility progression. Pt follows single step commands inconsistently and with increase time. Pt assisted as able for bed mobility and sitting balance EOB with L UE/LE. Max-total A +2 required for mobility this session. Continue to progress as tolerated with anticipated d/c to SNF for further skilled PT services.      Follow Up Recommendations  SNF     Equipment Recommendations  Wheelchair (measurements PT);Wheelchair cushion (measurements PT);Hospital bed    Recommendations for Other Services       Precautions / Restrictions Precautions Precautions: Fall Precaution Comments: R hemi, trach, PEG, flexiseal; No Bone flap L head;crani Restrictions Weight Bearing Restrictions: No    Mobility  Bed Mobility Overal bed mobility: Needs Assistance Bed Mobility: Sit to Supine;Supine to Sit           General bed mobility comments: pt positioned at [facility], HOB elevated all the way, and bed pad utilized to bring hips to EOB; pt assisted to bring L LE to EOB very minimally with multimodal cues and with initiation of mobility assisted with L UE; pt pushing at times EOB and with multimodal cues able to prop L elbow on raised HOB to assist in midline posture; max-total A +2 for all bed  mobility   Transfers                    Ambulation/Gait                 Stairs             Wheelchair Mobility    Modified Rankin (Stroke Patients Only) Modified Rankin (Stroke Patients Only) Pre-Morbid Rankin Score: No symptoms Modified Rankin: Severe disability     Balance Overall balance assessment: Needs assistance Sitting-balance support: Feet supported;Single extremity supported Sitting balance-Leahy Scale: Zero Sitting balance - Comments: worked on midline posture and cervical AROM with L elbow propped on elevated HOB; pushes at times but able to correct with cues and assistance;  Postural control: Posterior lean;Right lateral lean                                  Cognition Arousal/Alertness: Lethargic(more alert while sitting up EOB) Behavior During Therapy: Flat affect(little restless after doffing L mitt but easily redirected) Overall Cognitive Status: Difficult to assess Area of Impairment: Following commands;Problem solving;Safety/judgement                       Following Commands: Follows one step commands inconsistently;Follows one step commands with increased time Safety/Judgement: Decreased awareness of safety   Problem Solving: Slow processing;Decreased initiation;Difficulty sequencing;Requires verbal cues;Requires tactile cues General Comments: pt briefly with gaze past midline to R side but unable to  maintain and only while in supine, not in sitting; pt following single step commands inconsistently and smiling appropriately and attempted head nod yes X 2       Exercises General Exercises - Lower Extremity Long Arc Quad: AROM;Left;10 reps;Seated    General Comments        Pertinent Vitals/Pain Pain Assessment: Faces Faces Pain Scale: No hurt Pain Intervention(s): Monitored during session    Home Living                      Prior Function            PT Goals (current goals can now be  found in the care plan section) Progress towards PT goals: Progressing toward goals    Frequency    Min 2X/week      PT Plan Current plan remains appropriate    Co-evaluation              AM-PAC PT "6 Clicks" Mobility   Outcome Measure  Help needed turning from your back to your side while in a flat bed without using bedrails?: Total Help needed moving from lying on your back to sitting on the side of a flat bed without using bedrails?: Total Help needed moving to and from a bed to a chair (including a wheelchair)?: Total Help needed standing up from a chair using your arms (e.g., wheelchair or bedside chair)?: Total Help needed to walk in hospital room?: Total Help needed climbing 3-5 steps with a railing? : Total 6 Click Score: 6    End of Session Equipment Utilized During Treatment: Oxygen Activity Tolerance: Patient tolerated treatment well Patient left: in bed;with call bell/phone within reach;with bed alarm set Nurse Communication: Mobility status;Need for lift equipment PT Visit Diagnosis: Other abnormalities of gait and mobility (R26.89);Hemiplegia and hemiparesis Hemiplegia - Right/Left: Right Hemiplegia - dominant/non-dominant: Dominant Hemiplegia - caused by: Nontraumatic intracerebral hemorrhage     Time: 5093-2671 PT Time Calculation (min) (ACUTE ONLY): 25 min  Charges:  $Therapeutic Activity: 23-37 mins                     Earney Navy, PTA Acute Rehabilitation Services Pager: [phone] Office: [phone]     Darliss Cheney 07/30/2019, 5:18 PM

## 2019-07-30 NOTE — Discharge Instructions (Signed)
Information on my medicine - ELIQUIS (apixaban)  This medication education was reviewed with me or my healthcare representative as part of my discharge preparation.    Why was Eliquis prescribed for you? Eliquis was prescribed for you to reduce the risk of a blood clot forming that can cause a stroke if you have a medical condition called atrial fibrillation (a type of irregular heartbeat).  What do You need to know about Eliquis ? Take your Eliquis TWICE DAILY - one tablet in the morning and one tablet in the evening with or without food. If you have difficulty swallowing the tablet whole please discuss with your pharmacist how to take the medication safely.  Take Eliquis exactly as prescribed by your doctor and DO NOT stop taking Eliquis without talking to the doctor who prescribed the medication.  Stopping may increase your risk of developing a stroke.  Refill your prescription before you run out.  After discharge, you should have regular check-up appointments with your healthcare provider that is prescribing your Eliquis.  In the future your dose may need to be changed if your kidney function or weight changes by a significant amount or as you get older.  What do you do if you miss a dose? If you miss a dose, take it as soon as you remember on the same day and resume taking twice daily.  Do not take more than one dose of ELIQUIS at the same time to make up a missed dose.  Important Safety Information A possible side effect of Eliquis is bleeding. You should call your healthcare provider right away if you experience any of the following: ? Bleeding from an injury or your nose that does not stop. ? Unusual colored urine (red or dark brown) or unusual colored stools (red or black). ? Unusual bruising for unknown reasons. ? A serious fall or if you hit your head (even if there is no bleeding).  Some medicines may interact with Eliquis and might increase your risk of bleeding or  clotting while on Eliquis. To help avoid this, consult your healthcare provider or pharmacist prior to using any new prescription or non-prescription medications, including herbals, vitamins, non-steroidal anti-inflammatory drugs (NSAIDs) and supplements.  This website has more information on Eliquis (apixaban): [email or web address].com/[email or web address] =========================================================================    Cognitive Rehabilitation After a Stroke After a stroke, you may have various problems with thinking (cognitive disability). The types of problems you have will depend on how severe the stroke was and where it was located in the brain. Problems may include:  Problems with short-term memory.  Trouble paying attention.  Trouble communicating or understanding language (aphasia).  A drop in mental ability that may interfere with daily life (dementia).  Trouble with problem-solving and information processing.  Problems with reading, writing, or math.  Problems with your ability to plan and to perform activities in sequence (executive function). These problems can feel overwhelming. However, with rehabilitation and time to heal, many people have improvement in their symptoms. What causes cognitive disability? A stroke happens when blood cannot flow to certain areas of the brain. When this happens, brain cells die in the affected areas because they cannot get oxygen and nutrients from the blood. Cognitive disability is caused by the death of cells in the areas of the brain that control thinking. What is cognitive rehabilitation? Cognitive rehabilitation is a program to help you improve your thinking skills after a stroke. Rehabilitation cannot completely reverse the effects of a stroke, but it can help you  with memory, problem-solving, and communication skills. Therapy focuses on:  Improving brain function. This may involve activities such as learning to break down tasks  into simple steps.  Helping you learn ways to cope with thinking problems. For example, you might learn memory tricks or do activities that stimulate memory, such as naming objects or describing pictures. Cognitive rehabilitation may include:  Speech-language therapy to help you understand and use language to communicate.  Occupational therapy to help you perform daily activities.  Music therapy to help relieve stress, anxiety, and depression. This may involve listening to music, singing, or playing instruments.  Physical therapy to help improve your ability to move and perform actions that involve the muscles (motor functions). When will therapy start and where will I have therapy? Your health care provider will decide when it is best for you to start therapy. In some cases, people start rehabilitation as soon as their health is stable, which may be 24-48 hours after the stroke. Rehabilitation can take place in a few different places, based on your needs. It may take place in:  The hospital or an in-patient rehabilitation hospital.  An outpatient rehabilitation facility.  A long-term care facility.  A community rehabilitation clinic.  Your home. What are some tools to help after a stroke? There are a number of tools and apps that you can use on your smartphone, personal computer, or tablet to help improve brain function. Some of these apps include:  Calendar reminders or alarm apps to help with memory.  Note-taking or sketch pad apps to help with memory or communication.  Text-to-speech apps that allow you to listen to what you are reading, which helps your ability to understanding text.  Picture dictionary or picture message apps to help with communication.  E-readers. These can highlight text as it is read aloud, which helps with listening and reading skills. How can my friends or family help during my rehabilitation? During your recovery, it is important that your friends  and family members help you work toward more independence. Your caregivers should speak with your health care providers to learn how they can best help you during recovery. This may include working on speech-language or memory exercises at home, or helping with daily tasks and errands. If you have cognitive disability, you may be at risk for injury or accidents at home, such as forgetting to turn off the stove. Friends and family members can help ensure home safety by taking steps such as getting appliances with automatic shut-off features or storing dangerous objects in a secure place. What else should I know about cognitive rehabilitation after a stroke? Having trouble with memory and problem-solving can make you feel alone. You may also have mood changes, anxiety, or depression after a stroke. It is important to:  Stay connected with others through social groups, online support groups, or your community.  Talk to your friends, family, and caregivers about any emotional problems you are having.  Go to one-on-one or group therapy as suggested by your health care provider.  Stay physically active and exercise as often as suggested by your health care provider. Summary  After a stroke, some people have problems with thinking that affect attention, memory, language, communication, and problem-solving.  Cognitive rehabilitation is a program to help you regain brain function and learn skills to cope with thinking problems.  Rehabilitation cannot completely reverse the effects of a stroke, but it can help to improve quality of life.  Cognitive rehabilitation may include speech-language therapy,  occupational therapy, music therapy, and physical therapy. This information is not intended to replace advice given to you by your health care provider. Make sure you discuss any questions you have with your health care provider. Document Revised: 06/11/2018 Document Reviewed: 05/25/2016 Elsevier Patient  Education  [place].

## 2019-07-30 NOTE — Progress Notes (Signed)
STROKE TEAM PROGRESS NOTE   INTERVAL HISTORY Patient is lying comfortably in bed.Marland Kitchen  He is afebrile.  Heart rate is better now in the low 100s.  White count is stable at 8.9.  Blood cultures no growth  Vitals:   07/30/19 1136 07/30/19 1300 07/30/19 1429 07/30/19 1515  BP:  114/72    Pulse:  (!) 104 96 99  Resp: (!) 28 (!) 24 (!) 26 (!) 25  Temp:  98.9 F (37.2 C)    TempSrc:  Oral    SpO2:  96%  100%  Weight:      Height:       CBC:  Recent Labs  Lab 07/29/19 0408 07/30/19 0227  WBC 10.5 8.9  HGB 11.9* 11.3*  HCT 37.8* 36.0*  MCV 95.0 94.7  PLT 172 168   Basic Metabolic Panel:  Recent Labs  Lab 07/29/19 0408 07/30/19 0227  NA 136 135  K 4.9 4.1  CL 101 102  CO2 24 25  GLUCOSE 84 137*  BUN 19 22*  CREATININE 0.83 0.78  CALCIUM 8.5* 8.5*    IMAGING past 24 hours No results found.  PHYSICAL EXAM       General - mildly obese middle-aged Caucasian male, s/p tracheostomy on trach collar  Ophthalmologic - fundi not visualized due to noncooperation.  Cardiovascular - Regular rate and rhythm.  Neuro - s/p tracheostomy on trach collar. No sedation, eyes closed but open with voice. Still not following any simple commands. left gaze preference position, barely cross midline, not blinking to visual threat on the left, doll's eyes present, pupil left 2mm, right 4.56mm, both reactive to light. Corneal reflex weak on the right and brisk on the left, gag and cough present. Breathing over the vent.  right facial droop.  Tongue protrusion not cooperative. Spontaneous and purposeful movement left UE 4/5 and at least LE 2+/5. However, right UE and LE plegic. DTR 1+ and no babinski. Sensation, coordination and gait not tested.    ASSESSMENT/PLAN [name] with history of COPD, ongoing tobacco use, and psoriasis, presenting with dense LMCA syndrome. Attempt for Left ICA and Left M1 thrombectomy was done emergently with TICI 2b flow. Unfortunately, the  pt had post procedure hemorrhage and reocclusion of the vessels. F/u imaging suggested early edema and pt underwent prophylactic decompressive hemicraniectomy.  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  Stroke etiology unknown - given DVT requiring eventual AC and high mRS, will not do further embolic workup  Code Stroke CT head showed early hypodensity in nearly entire LMCA. ASPECTS 6-7 per notes.  CTA head & neck occlusion cervical ICA on the left. This is most likely due to dissection. The patient has minimal atherosclerotic disease.  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  There is poor collateral circulation on the left.   CTP CBF large core volume of 266 mL, perfusion volume 313 mL, mismatch volume 47 mL.  ASPECTS using CTP by Dr. Chestine Hooper 6 or 7.   IR - TICI2c revascularization  MRI  Restricted diffusion throughout the majority of the left MCA and ACA vascular territories consistent with acute ischemia.  MRA - reocclusion of the intracranial left internal carotid artery. No flow related signal is seen within the left middle cerebral artery.  CT Head 07/04/19 - Slightly increased rightward midline shift with a small amount of herniation beneath the anterior falx. Otherwise unchanged examination.  CT repeat 5/6 stable infarcts and SAH but increasing edema w/ midline shift now 27mm and herniation through crani site  CT repeat 5/21 decreased shift 5->62mm. Extensive L crani w/ buldging through defect. Still L ACA and MCA low density w/ petechial hemorrhage but no large hemorrhage.   2D Echo - EF 55 - 60%. No cardiac source of emboli identified.   LDL 45  HgbA1c  5.5  Subq heparin for VTE prophylaxis  No antithrombotic prior to admission, now on Eliquis.  Therapy recommendations:  SNF  Disposition:  Pending - LTACH consulted however no insurance   Respiratory failure s/p Trach Trach wound  Intubated on vent  CCM on board  CXR 07/03/19- New consolidation in the right lower lobe and left infrahilar region concerning for pneumonia. Possible small layering right effusion.  CXR 5/3 mild pulm edema w/ atx vs infiltrate/aspiration  On cefepime 4/30>>5/2 for pneumonia  Augmentin 5/2>>5/4  Unasyn 5/5>>5/6  Trach placed 5/8 per CCM  WOC consulted for trach wound ->progressed to stage 3 pressure, for dressing change q3d and prn  Fever and elevated white count and tachycardia hence restarted on Vanc & Cefepime 5/11>>5/13  Ancef 5/14>>5/17  (pneumonia)  On trach collar since 5/14 w/ high FiO2 requirements  On progressive floor  On 28% ATC  downsized to cuffless trach 6 by CCM 5/24  Cerebral edema Hypernatremia   MRI - Restricted diffusion throughout the majority of the left MCA and ACA vascular territories consistent with acute ischemia.  CT Head 07/04/19 - Slightly increased rightward midline shift with a small amount of herniation beneath the anterior falx. Otherwise unchanged examination.  S/p decompressive left hemicraniectomy  4/28 Dr Lee Hooper  CT 5/6 - stable infarcts and SAH but increasing edema w/ midline shift now 29mm and herniation through crani site  CT head 5/20 - decreased shift 5->55mm. Extensive L crani w/ buldging through defect. Still L ACA and MCA low density w/ petechial hemorrhage but no large hemorrhage.  completed keppra 2 wk course per NSG  Off 3% and D5NS  Okay to allow Na gradually normalize  Na normalized   Crani sutures out -> suture incision leaking 5/16 - s/p local suture  Sepsis, resolved Fever w/ leukocytosis HCAP, resolved, ? Recurrence Hypotension  TMax 102.7  Leukocytosis - 12.5  UA  neg  Sputum moraxella, H. flu   On cefepime 4/30>>5/2; Augmentin 5/2>>5/4, Unasyn 5/5>>5/6 -> off  Blood culture 5/2 no growth  Blood cultures 5/7 - no growth   Resp cultures 5/7 - no growth  CXR 5/9 - streaky BLL opacilites atx/consolication/PNA. Possible R pleural effusion  CXR 5/10 w/ atx vs infiltrate  CXR 07/15/19 - Stable bibasilar atelectasis a small right pleural effusion. No new opacity evident. Stable cardiac silhouette  Sputum cx 5/11 reincubated   Blood Cx 5/11 no growth   Vanc 5/11>>5/13, Cefepime 5/11>>5/13  Ancef 5/14>>5/17  (pneumonia)  CXR 5/18 slight improvement in bibasilar atx  CXR 5/24 increased vol loss RLL w/ streaky opacities, worsening atx, ? PNA. Minor LLL atx.  Vanc 5/24>>  Cefepime 5/24>>  UA pending   Repeat CXR scheduled for tonight  Contacted CCM to see today  Bilateral Lower Ext DVT, extensive Possible PE given tachycardia  Venous  US 07/11/2019 DVT + bilateral extensive  IVC filter placed 5/9  On eliquis now for DVT prophylaxis  Hypertension  Home meds:  None  Current meds: none scheduled  SBP goal < 160 due to hemorrhagic conversion . Long-term BP goal normotensive  Dysphagia   NPO  Coretrak 5/7  PEG placement 5/14  On TF @ 15  FW 150 Q4h; NS @ 100h   Transitioned to bolus feeding  Ileus, resolved  Abd distension w/ decreased BS  KUB 5/2 distended colonic loop  Miralax   Flexiseal following laxatives  On reglan, laxatives on hold, decrease fentanyl  Coretrak 5/7  PEG 5/14  TF resumed after PEG  Acute Renal Failure, resolved  Renal US - increased cortical echogenicity in B kidneys c/w renal dz. Increased echogenicity of liver ?hepatic steatosis.  Urine studies - elevated protein creatinine ratio, Na normal, UN normal  Creatinine - 0.85  Diuresis - per CCM  Tobacco abuse  Current smoker  Smoking cessation counseling will be provided if able  Other Stroke Risk Factors  Obesity, Body  mass index is 29.93 kg/m., recommend weight loss, diet and exercise as appropriate    Likely undiagnosed obstructive sleep apnea based on body habitus  Other Active Problems  Hypokalemia - resolved   Thrombocytopenia - resolved  Elevated LFT - AST/ALT 54/102, improved    Constipation. Miralax daily -> loose stool -> miralax discontinued  Charm trial for cytotoxic edema  Mild anemia - Hb - 12.6  Hospital day # 29  Continue vancomycin and cefepime continue ongoing care.  Await nursing home bed availability.   No changes Antony Contras, MD Stroke Neurology 07/30/2019 3:29 PM   To contact Stroke Continuity provider, please refer to [email or web address] After hours, contact General Neurology

## 2019-07-31 LAB — GLUCOSE, CAPILLARY
Glucose-Capillary: 105 mg/dL — ABNORMAL HIGH (ref 70–99)
Glucose-Capillary: 107 mg/dL — ABNORMAL HIGH (ref 70–99)
Glucose-Capillary: 128 mg/dL — ABNORMAL HIGH (ref 70–99)
Glucose-Capillary: 149 mg/dL — ABNORMAL HIGH (ref 70–99)
Glucose-Capillary: 173 mg/dL — ABNORMAL HIGH (ref 70–99)
Glucose-Capillary: 95 mg/dL (ref 70–99)

## 2019-07-31 LAB — CBC
HCT: 35.1 % — ABNORMAL LOW (ref 39.0–52.0)
Hemoglobin: 11 g/dL — ABNORMAL LOW (ref 13.0–17.0)
MCH: 30.1 pg (ref 26.0–34.0)
MCHC: 31.3 g/dL (ref 30.0–36.0)
MCV: 95.9 fL (ref 80.0–100.0)
Platelets: 168 10*3/uL (ref 150–400)
RBC: 3.66 MIL/uL — ABNORMAL LOW (ref 4.22–5.81)
RDW: 17.2 % — ABNORMAL HIGH (ref 11.5–15.5)
WBC: 7.3 10*3/uL (ref 4.0–10.5)
nRBC: 0 % (ref 0.0–0.2)

## 2019-07-31 LAB — BASIC METABOLIC PANEL
Anion gap: 9 (ref 5–15)
BUN: 23 mg/dL — ABNORMAL HIGH (ref 6–20)
CO2: 27 mmol/L (ref 22–32)
Calcium: 9 mg/dL (ref 8.9–10.3)
Chloride: 103 mmol/L (ref 98–111)
Creatinine, Ser: 0.74 mg/dL (ref 0.61–1.24)
GFR calc Af Amer: >60 mL/min (ref >60)
GFR calc non Af Amer: >60 mL/min (ref >60)
Glucose, Bld: 98 mg/dL (ref 70–99)
Potassium: 3.8 mmol/L (ref 3.5–5.1)
Sodium: 139 mmol/L (ref 135–145)

## 2019-07-31 NOTE — Progress Notes (Signed)
Occupational Therapy Treatment Patient Details Name: Lee Hooper MRN: [id number] DOB: 09-Feb-1971 Today's Date: 07/31/2019    History of present illness This 49 y.o. male admitted 4/28 with Rt sided hemiplegia and aphasia.  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  Repeat CT following procedure showed moderate basal ganglia hemorrhage vs. stroke   Repeat MRI revealed that the ICA and MCA re-occluded with change in FLAIR sequence excluding him from repeat thrombectomy.  He underwent a decompressive hemicraniectomy.  He waws intubated 4/28.  He developed an ileus 5/2. Trach 5/8. IVC filter placed 5/9.  Peg placement.  PMH includes: COPD, tobacco abuse, psoriasis   OT comments  Pt making graudal progress towards OT goals this session. Pt continues to require total A +2 for bed mobility with pt exiting to R side of bed with HOB elevated. Pt pushes heavily in sitting despite MAX efforts to position pts LUE on bed rail. Pt follow commands 70% of time needing total A for all ADLs during session. Pt continues to present with R gaze preference; cues to track to L but unable to maintain for more than a couple seconds. Positioned pt with neck in neutral at end of session as pt with tendency to prefer R cervicall rotation. Pt on 5LPM FiO2 28% with O2 WFL, HR max 134bpm with mobility. DC plan remains appropriate, will follow acutely per POC.    Follow Up Recommendations  SNF;Supervision/Assistance - 24 hour    Equipment Recommendations  Other (comment)(TBD at next venue of care)    Recommendations for Other Services      Precautions / Restrictions Precautions Precautions: Fall Precaution Comments: R hemi, trach, PEG, flexiseal; No Bone flap L head;crani Restrictions Weight Bearing Restrictions: No       Mobility Bed Mobility Overal bed mobility: Needs Assistance Bed Mobility: Sit to Supine;Supine to Sit;Rolling Rolling: Total assist;+2 for physical assistance   Supine to sit:  Total assist;+2 for physical assistance;+2 for safety/equipment Sit to supine: Total assist;+2 for physical assistance   General bed mobility comments: elevated HOB with pt exiting bed to R side however pt with minimally initiation on reaching with LUE for rail needing total A +2 to scoot hips EOB. Pt required total A for sitting balance and pushing heavily despite MAX multimodal cues to utilize LUE on rail to maintain sitting balance.  Transfers                 General transfer comment: deferred    Balance Overall balance assessment: Needs assistance Sitting-balance support: Feet supported;Single extremity supported Sitting balance-Leahy Scale: Zero Sitting balance - Comments: total A to maintain upright posture                                   ADL either performed or assessed with clinical judgement   ADL Overall ADL's : Needs assistance/impaired     Grooming: Wash/dry face;Bed level;Total assistance Grooming Details (indicate cue type and reason): total A to wash face with LUE             Lower Body Dressing: Total assistance;Bed level Lower Body Dressing Details (indicate cue type and reason): to don socks   Toilet Transfer Details (indicate cue type and reason): defer d/t safety         Functional mobility during ADLs: Total assistance;+2 for physical assistance(bed mobility only) General ADL Comments: pt continues to require total A  for ADLs and bed mobility     Vision   Vision Assessment?: Vision impaired- to be further tested in functional context Additional Comments: pt continues to present with R gaze preference; cues to track to L but unable to maintain for more than a couple seconds. Positioned pt with neck in neutral at end of session at pt with tendency to keep neck and gaze R   Perception     Praxis      Cognition Arousal/Alertness: Lethargic Behavior During Therapy: Flat affect;Impulsive(impulsively reaching out for OTA and  resisting support while sitting EOB) Overall Cognitive Status: Difficult to assess Area of Impairment: Following commands;Safety/judgement                       Following Commands: Follows one step commands inconsistently;Follows one step commands with increased time Safety/Judgement: Decreased awareness of safety     General Comments: pt flat overall but following commands ~ 70% of time needing increased time        Exercises Other Exercises Other Exercises: RUE PROM via elbow flexion/ extension x10 reps Other Exercises: cued pt to rotate neck L x4 reps as pt tends to prefer R cerival rotation   Shoulder Instructions       General Comments BP start of session 128/78; pt 5LPM FiO2 28% with O2 WFL, HR max 134bpm with mobility    Pertinent Vitals/ Pain       Pain Assessment: Faces Faces Pain Scale: Hurts a little bit Pain Location: grimacing during bed mobility and while coughing Pain Descriptors / Indicators: Grimacing Pain Intervention(s): Monitored during session;Repositioned  Home Living                                          Prior Functioning/Environment              Frequency  Min 2X/week        Progress Toward Goals  OT Goals(current goals can now be found in the care plan section)  Progress towards OT goals: Progressing toward goals  Acute Rehab OT Goals Patient Stated Goal: none stated by patient OT Goal Formulation: Patient unable to participate in goal setting Time For Goal Achievement: 08/04/19 Potential to Achieve Goals: [place] Discharge plan remains appropriate;Frequency remains appropriate    Co-evaluation                 AM-PAC OT "6 Clicks" Daily Activity     Outcome Measure   Help from another person eating meals?: Total Help from another person taking care of personal grooming?: A Lot Help from another person toileting, which includes using toliet, bedpan, or urinal?: Total Help from another  person bathing (including washing, rinsing, drying)?: Total Help from another person to put on and taking off regular upper body clothing?: Total Help from another person to put on and taking off regular lower body clothing?: Total 6 Click Score: 7    End of Session Equipment Utilized During Treatment: Oxygen;Other (comment)(trach collar)  OT Visit Diagnosis: Unsteadiness on feet (R26.81);Cognitive communication deficit (R41.841);Hemiplegia and hemiparesis;Muscle weakness (generalized) (M62.81);Apraxia (R48.2);Other symptoms and signs involving cognitive function;Pain Symptoms and signs involving cognitive functions: Cerebral infarction Hemiplegia - Right/Left: Right Hemiplegia - dominant/non-dominant: Dominant Hemiplegia - caused by: Cerebral infarction Pain - Right/Left: Right   Activity Tolerance Patient limited by lethargy;Other (comment)   Patient Left in bed;with call  bell/phone within reach;with bed alarm set   Nurse Communication          Time: [phone] OT Time Calculation (min): 24 min  Charges: OT General Charges $OT Visit: 1 Visit OT Treatments $Therapeutic Activity: 23-37 mins  Lee Amel., COTA/L Acute Rehabilitation Services [phone]    Lee Hooper 07/31/2019, 1:44 PM

## 2019-07-31 NOTE — Progress Notes (Signed)
STROKE TEAM PROGRESS NOTE   INTERVAL HISTORY Patient is lying comfortably in bed..  He had T-max 101.9 last night but this morning is afebrile.  Heart rate is better now in the 90s.  White count is 7.3.  Blood cultures no growth  Vitals:   07/31/19 0925 07/31/19 1103 07/31/19 1202 07/31/19 1235  BP:  118/74 128/78 120/73  Pulse: 99 90 (!) 105 100  Resp: (!) 25  (!) 24 (!) 21  Temp:    99.6 F (37.6 C)  TempSrc:   Axillary Oral  SpO2:    96%  Weight:      Height:       CBC:  Recent Labs  Lab 07/30/19 0227 07/31/19 0402  WBC 8.9 7.3  HGB 11.3* 11.0*  HCT 36.0* 35.1*  MCV 94.7 95.9  PLT 168 168   Basic Metabolic Panel:  Recent Labs  Lab 07/30/19 0227 07/31/19 0402  NA 135 139  K 4.1 3.8  CL 102 103  CO2 25 27  GLUCOSE 137* 98  BUN 22* 23*  CREATININE 0.78 0.74  CALCIUM 8.5* 9.0    IMAGING past 24 hours No results found.  PHYSICAL EXAM       General - mildly obese middle-aged Caucasian male, s/p tracheostomy on trach collar  Ophthalmologic - fundi not visualized due to noncooperation.  Cardiovascular - Regular rate and rhythm.  Neuro - s/p tracheostomy on trach collar. No sedation, eyes closed but open with voice. Still not following any simple commands. left gaze preference position, barely cross midline, not blinking to visual threat on the left, doll's eyes present, pupil left 77mm, right 4.74mm, both reactive to light. Corneal reflex weak on the right and brisk on the left, gag and cough present. Breathing over the vent.  right facial droop.  Tongue protrusion not cooperative. Spontaneous and purposeful movement left UE 4/5 and at least LE 2+/5. However, right UE and LE plegic. DTR 1+ and no babinski. Sensation, coordination and gait not tested.    ASSESSMENT/PLAN [name] is a 48 y.o. male with history of COPD, ongoing tobacco use, and psoriasis, presenting with dense LMCA syndrome. Attempt for Left ICA and Left M1 thrombectomy was done  emergently with TICI 2b flow. Unfortunately, the pt had post procedure hemorrhage and reocclusion of the vessels. F/u imaging suggested early edema and pt underwent prophylactic decompressive hemicraniectomy.  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  Stroke etiology unknown - given DVT requiring eventual AC and high mRS, will not do further embolic workup  Code Stroke CT head showed early hypodensity in nearly entire LMCA. ASPECTS 6-7 per notes.  CTA head & neck occlusion cervical ICA on the left. This is most likely due to dissection. The patient has minimal atherosclerotic disease.  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  There is poor collateral circulation on the left.   CTP CBF large core volume of 266 mL, perfusion volume 313 mL, mismatch volume 47 mL.  ASPECTS using CTP by Dr. Chestine Spore 6 or 7.   IR - TICI2c revascularization  MRI  Restricted diffusion throughout the majority of the left MCA and ACA vascular territories  consistent with acute ischemia.  MRA - reocclusion of the intracranial left internal carotid artery. No flow related signal is seen within the left middle cerebral artery.  CT Head 07/04/19 - Slightly increased rightward midline shift with a small amount of herniation beneath the anterior falx. Otherwise unchanged examination.  CT repeat 5/6 stable infarcts and SAH but increasing edema w/ midline shift now 30mm and herniation through crani site  CT repeat 5/21 decreased shift 5->4mm. Extensive L crani w/ buldging through defect. Still L ACA and MCA low density w/ petechial hemorrhage but no large hemorrhage.   2D Echo - EF 55 - 60%. No cardiac  source of emboli identified.   LDL 45  HgbA1c 5.5  Subq heparin for VTE prophylaxis  No antithrombotic prior to admission, now on Eliquis.  Therapy recommendations:  SNF  Disposition:  Pending -SNF consulted however no insurance   Respiratory failure s/p Trach Trach wound  Intubated on vent  CCM on board  CXR 07/03/19- New consolidation in the right lower lobe and left infrahilar region concerning for pneumonia. Possible small layering right effusion.  CXR 5/3 mild pulm edema w/ atx vs infiltrate/aspiration  On cefepime 4/30>>5/2 for pneumonia  Augmentin 5/2>>5/4  Unasyn 5/5>>5/6  Trach placed 5/8 per CCM  WOC consulted for trach wound ->progressed to stage 3 pressure, for dressing change q3d and prn  Fever and elevated white count and tachycardia hence restarted on Vanc & Cefepime 5/11>>5/13  Ancef 5/14>>5/17  (pneumonia)  On trach collar since 5/14 w/ high FiO2 requirements  On progressive floor  On 28% ATC  downsized to cuffless trach 6 by CCM 5/24  Cerebral edema Hypernatremia   MRI - Restricted diffusion throughout the majority of the left MCA and ACA vascular territories consistent with acute ischemia.  CT Head 07/04/19 - Slightly increased rightward midline shift with a small amount of herniation beneath the anterior falx. Otherwise unchanged examination.  S/p decompressive left hemicraniectomy  4/28 Dr Franky Macho  CT 5/6 - stable infarcts and SAH but increasing edema w/ midline shift now 95mm and herniation through crani site  CT head 5/20 - decreased shift 5->75mm. Extensive L crani w/ buldging through defect. Still L ACA and MCA low density w/ petechial hemorrhage but no large hemorrhage.  completed keppra 2 wk course per NSG  Off 3% and D5NS  Okay to allow Na gradually normalize  Na normalized   Crani sutures out -> suture incision leaking 5/16 - s/p local suture  Sepsis, resolved Fever w/ leukocytosis HCAP, resolved, ?  Recurrence Hypotension  TMax 102.7  Leukocytosis - 12.5  UA neg  Sputum moraxella, H. flu   On cefepime 4/30>>5/2; Augmentin 5/2>>5/4, Unasyn 5/5>>5/6 -> off  Blood culture 5/2 no growth  Blood cultures 5/7 - no growth   Resp cultures 5/7 - no growth  CXR 5/9 - streaky BLL opacilites atx/consolication/PNA. Possible R pleural effusion  CXR 5/10 w/ atx vs infiltrate  CXR 07/15/19 - Stable bibasilar atelectasis a small right pleural effusion. No new opacity evident. Stable cardiac silhouette  Sputum cx 5/11 reincubated   Blood Cx 5/11 no growth   Vanc 5/11>>5/13, Cefepime 5/11>>5/13  Ancef 5/14>>5/17  (pneumonia)  CXR 5/18 slight improvement in bibasilar atx  CXR 5/24 increased vol loss RLL w/ streaky opacities, worsening atx, ? PNA. Minor LLL atx.  Vanc 5/24>>  Cefepime 5/24>>  UA pending   Repeat CXR scheduled for tonight  Contacted CCM to see today  Bilateral Lower Ext DVT, extensive Possible PE  given tachycardia  Venous US 07/11/2019 DVT + bilateral extensive  IVC filter placed 5/9  On eliquis now for DVT prophylaxis  Hypertension  Home meds:  None  Current meds: none scheduled  SBP goal < 160 due to hemorrhagic conversion . Long-term BP goal normotensive  Dysphagia   NPO  Coretrak 5/7  PEG placement 5/14  On TF @ 73  FW 150 Q4h; NS @ 100h   Transitioned to bolus feeding  Ileus, resolved  Abd distension w/ decreased BS  KUB 5/2 distended colonic loop  Miralax   Flexiseal following laxatives  On reglan, laxatives on hold, decrease fentanyl  Coretrak 5/7  PEG 5/14  TF resumed after PEG  Acute Renal Failure, resolved  Renal US - increased cortical echogenicity in B kidneys c/w renal dz. Increased echogenicity of liver ?hepatic steatosis.  Urine studies - elevated protein creatinine ratio, Na normal, UN normal  Creatinine - 0.85  Diuresis - per CCM  Tobacco abuse  Current smoker  Smoking cessation counseling will  be provided if able  Other Stroke Risk Factors  Obesity, Body mass index is 31.6 kg/m., recommend weight loss, diet and exercise as appropriate    Likely undiagnosed obstructive sleep apnea based on body habitus  Other Active Problems  Hypokalemia - resolved   Thrombocytopenia - resolved  Elevated LFT - AST/ALT 54/102, improved    Constipation. Miralax daily -> loose stool -> miralax discontinued  Charm trial for cytotoxic edema  Mild anemia - Hb - 12.6  Hospital day # 30   vancomycin now discontinued as cultures negative and cefepime continued at present .  Await nursing home bed availability.   No changes Antony Contras, MD Stroke Neurology 07/31/2019 1:06 PM   To contact Stroke Continuity provider, please refer to [email or web address] After hours, contact General Neurology

## 2019-08-01 LAB — URINALYSIS, COMPLETE (UACMP) WITH MICROSCOPIC
Bacteria, UA: NONE SEEN
Bilirubin Urine: NEGATIVE
Glucose, UA: NEGATIVE mg/dL
Hgb urine dipstick: NEGATIVE
Ketones, ur: NEGATIVE mg/dL
Leukocytes,Ua: NEGATIVE
Nitrite: NEGATIVE
Protein, ur: NEGATIVE mg/dL
Specific Gravity, Urine: 1.013 (ref 1.005–1.030)
pH: 7 (ref 5.0–8.0)

## 2019-08-01 LAB — GLUCOSE, CAPILLARY
Glucose-Capillary: 95 mg/dL (ref 70–99)
Glucose-Capillary: 107 mg/dL — ABNORMAL HIGH (ref 70–99)
Glucose-Capillary: 167 mg/dL — ABNORMAL HIGH (ref 70–99)
Glucose-Capillary: 104 mg/dL — ABNORMAL HIGH (ref 70–99)

## 2019-08-01 LAB — CULTURE, BLOOD (ROUTINE X 2)
Culture: NO GROWTH
Special Requests: ADEQUATE

## 2019-08-01 NOTE — Progress Notes (Signed)
STROKE TEAM PROGRESS NOTE   INTERVAL HISTORY No changes overnight, discussed with his nurse, patient stable, nurse at bedside.  Vitals:   08/01/19 0405 08/01/19 0731 08/01/19 0746 08/01/19 1123  BP: 115/74 125/80 124/71 134/75  Pulse: (!) 112 (!) 106 99 (!) 111  Resp: (!) 24 (!) 27 (!) 25 20  Temp:   98.5 F (36.9 C)   TempSrc:   Oral   SpO2: 92%  96% 97%  Weight:      Height:       CBC:  Recent Labs  Lab 07/30/19 0227 07/31/19 0402  WBC 8.9 7.3  HGB 11.3* 11.0*  HCT 36.0* 35.1*  MCV 94.7 95.9  PLT 168 168   Basic Metabolic Panel:  Recent Labs  Lab 07/30/19 0227 07/31/19 0402  NA 135 139  K 4.1 3.8  CL 102 103  CO2 25 27  GLUCOSE 137* 98  BUN 22* 23*  CREATININE 0.78 0.74  CALCIUM 8.5* 9.0    IMAGING past 24 hours No results found.  CXR Portable 07/27/19 IMPRESSION: 1. Increased volume loss at the right lung base with streaky opacities, favor worsening atelectasis. Pneumonia is also considered in the setting of elevated temperature. 2. Minor left lung base atelectasis, with improvement from prior exam.   PHYSICAL EXAM      Neuro exam: Status post tracheostomy on trach collar, no sedation, patient sleeping but arousable with sternal rub, not following commands, left gaze preference without crossing the midline, left-sided hemianopia, fundi reactive to light possibly asymmetric by 1 to 2 mm but both reactive equally, cannot perform funduscopy due to noncooperation, right facial droop, could not get him to protrude his tongue or open his mouth to look at the uvula, right hemiplegia.    ASSESSMENT/PLAN Lee Hooper is a 49 y.o. male with history of COPD, ongoing tobacco use, and psoriasis, presenting with dense LMCA syndrome. Attempt for Left ICA and Left M1 thrombectomy was done emergently with TICI 2b flow. Unfortunately, the pt had post procedure hemorrhage and reocclusion of the vessels. F/u imaging suggested early edema and pt underwent  prophylactic decompressive hemicraniectomy.  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 Stroke etiology unknown - given DVT requiring eventual AC and high mRS, will not do further embolic workup  Code Stroke CT head showed early hypodensity in nearly entire LMCA. ASPECTS 6-7 per notes.  CTA head & neck occlusion cervical ICA on the left. This is most likely due to dissection. The patient has minimal atherosclerotic disease.  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  There is poor collateral circulation on the left.   CTP CBF large core volume of 266 mL, perfusion volume 313 mL, mismatch volume 47 mL.  ASPECTS using CTP by Dr. Chestine Spore 6 or 7.   IR - TICI2c revascularization  MRI  Restricted diffusion throughout the majority of the left MCA and ACA vascular territories consistent with acute ischemia.  MRA - reocclusion of the intracranial left internal carotid artery. No flow related signal is seen within the left middle cerebral artery.  CT Head 07/04/19 - Slightly increased rightward midline shift with a small amount of herniation beneath the  anterior falx. Otherwise unchanged examination.  CT repeat 5/6 stable infarcts and SAH but increasing edema w/ midline shift now 51mm and herniation through crani site  CT repeat 5/21 decreased shift 5->75mm. Extensive L crani w/ buldging through defect. Still L ACA and MCA low density w/ petechial hemorrhage but no large hemorrhage.   2D Echo - EF 55 - 60%. No cardiac source of emboli identified.   LDL 45  HgbA1c 5.5  Subq heparin for VTE prophylaxis  No antithrombotic prior to admission, now on Eliquis.  Therapy  recommendations:  SNF   Disposition:  Pending - LTACH consulted however no insurance   Respiratory failure s/p Trach Trach wound  Intubated on vent  CCM on board  CXR 07/03/19- New consolidation in the right lower lobe and left infrahilar region concerning for pneumonia. Possible small layering right effusion.  CXR 5/3 mild pulm edema w/ atx vs infiltrate/aspiration  On cefepime 4/30>>5/2 for pneumonia  Augmentin 5/2>>5/4  Unasyn 5/5>>5/6  Trach placed 5/8 per CCM  WOC consulted for trach wound ->progressed to stage 3 pressure, for dressing change q3d and prn  Fever and elevated white count and tachycardia hence restarted on Vanc & Cefepime 5/11>>5/13  Ancef 5/14>>5/17  (pneumonia)  On trach collar since 5/14 w/ high FiO2 requirements  On 28% ATC  Downsized to cuffless trach 6 by CCM 5/24  Cerebral edema Hypernatremia   MRI - Restricted diffusion throughout the majority of the left MCA and ACA vascular territories consistent with acute ischemia.  CT Head 07/04/19 - Slightly increased rightward midline shift with a small amount of herniation beneath the anterior falx. Otherwise unchanged examination.  S/p decompressive left hemicraniectomy  4/28 Dr Christella Noa  CT 5/6 - stable infarcts and SAH but increasing edema w/ midline shift now 66mm and herniation through crani site  CT head 5/20 - decreased shift 5->24mm. Extensive L crani w/ buldging through defect. Still L ACA and MCA low density w/ petechial hemorrhage but no large hemorrhage.  completed keppra 2 wk course per NSG  Off 3% and D5NS  Okay to allow Na gradually normalize  Na normalized   Crani sutures out -> suture incision leaking 5/16 - s/p local suture  Sepsis, resolved Fever w/ leukocytosis HCAP, resolved, ? Recurrence Hypotension  TMax 102.7->98.5  Leukocytosis - 12.5->8.9->7.3  UA neg  Sputum moraxella, H. flu   On cefepime 4/30>>5/2; Augmentin 5/2>>5/4, Unasyn 5/5>>5/6 -> off  Blood  culture 5/2 no growth  Blood cultures 5/7 - no growth   Resp cultures 5/7 - no growth  CXR 5/9 - streaky BLL opacilites atx/consolication/PNA. Possible R pleural effusion  CXR 5/10 w/ atx vs infiltrate  CXR 07/15/19 - Stable bibasilar atelectasis a small right pleural effusion. No new opacity evident. Stable cardiac silhouette  Sputum cx 5/11 reincubated   Blood Cx 5/11 no growth   Vanc 5/11>>5/13, Cefepime 5/11>>5/13  Ancef 5/14>>5/17  (pneumonia)  CXR 5/18 slight improvement in bibasilar atx  CXR 5/24 increased vol loss RLL w/ streaky opacities, worsening atx, ? PNA. Minor LLL atx.  Vanc 5/24>>off  Cefepime 5/24>>  UA - couldn't find -> reordered  CXR 07/27/19 - Increased volume loss at the right lung base with streaky opacities, favor worsening atelectasis. Pneumonia is also considered in the setting of elevated temperature.  Contacted CCM to see (?)  Bilateral Lower Ext DVT, extensive Possible PE given tachycardia  Venous US 07/11/2019 DVT + bilateral extensive  IVC filter placed 5/9  On eliquis now for DVT prophylaxis  Hypertension  Home meds:  None  Current meds: none scheduled  SBP goal < 160 due to hemorrhagic conversion . Long-term BP goal normotensive  Dysphagia   NPO  Coretrak 5/7  PEG placement 5/14  On TF @ 55  FW 150 Q4h; NS @ 100h   Transitioned to bolus feeding  Ileus, resolved  Abd distension w/ decreased BS  KUB 5/2 distended colonic loop  Miralax   Flexiseal following laxatives  On reglan, laxatives on hold, decrease fentanyl  Coretrak 5/7  PEG 5/14  TF resumed after PEG  Acute Renal Failure, resolved  Renal US - increased cortical echogenicity in B kidneys c/w renal dz. Increased echogenicity of liver ?hepatic steatosis.  Urine studies - elevated protein creatinine ratio, Na normal, UN normal  Creatinine - 0.85  Diuresis - per CCM  Tobacco abuse  Current smoker  Smoking cessation counseling will be  provided if able  Other Stroke Risk Factors  Obesity, Body mass index is 30.08 kg/m., recommend weight loss, diet and exercise as appropriate    Likely undiagnosed obstructive sleep apnea based on body habitus  Other Active Problems  Hypokalemia - resolved   Thrombocytopenia - resolved  Elevated LFT - AST/ALT 54/102, improved    Constipation. Miralax daily -> loose stool -> miralax discontinued  Charm trial for cytotoxic edema  Mild anemia - Hb - 12.6  Hospital day # 31  Personally examined patient and images, and have participated in and made any corrections needed to history, physical, neuro exam,assessment and plan as stated above.  I have personally obtained the history, evaluated lab date, reviewed imaging studies and agree with radiology interpretations.    Naomie Dean, MD Stroke Neurology   A total of 25 minutes was spent for the care of this patient, spent on counseling patient and family on different diagnostic and therapeutic options, counseling and coordination of care, riskd ans benefits of management, compliance, or risk factor reduction and education.   To contact Stroke Continuity provider, please refer to [email or web address]. After hours, contact General Neurology

## 2019-08-02 LAB — GLUCOSE, CAPILLARY
Glucose-Capillary: 102 mg/dL — ABNORMAL HIGH (ref 70–99)
Glucose-Capillary: 158 mg/dL — ABNORMAL HIGH (ref 70–99)
Glucose-Capillary: 173 mg/dL — ABNORMAL HIGH (ref 70–99)
Glucose-Capillary: 199 mg/dL — ABNORMAL HIGH (ref 70–99)
Glucose-Capillary: 95 mg/dL (ref 70–99)

## 2019-08-02 LAB — CULTURE, BLOOD (ROUTINE X 2)
Culture: NO GROWTH
Special Requests: ADEQUATE

## 2019-08-02 NOTE — Progress Notes (Signed)
STROKE TEAM PROGRESS NOTE   INTERVAL HISTORY  No changes overnight, discussed with his nurse, patient stable, nurse at bedside. No changes, he is sitting in bed, NAD, eyes open, not following commands consistently  Vitals:   08/02/19 1105 08/02/19 1111 08/02/19 1313 08/02/19 1520  BP: 126/75  116/90 120/74  Pulse: (!) 106  100 100  Resp:  (!) 24 (!) 23 (!) 25  Temp: 100.3 F (37.9 C)  98.4 F (36.9 C) 98.9 F (37.2 C)  TempSrc: Oral  Oral Oral  SpO2: 96%  97% 98%  Weight:      Height:       CBC:  Recent Labs  Lab 07/30/19 0227 07/31/19 0402  WBC 8.9 7.3  HGB 11.3* 11.0*  HCT 36.0* 35.1*  MCV 94.7 95.9  PLT 168 168   Basic Metabolic Panel:  Recent Labs  Lab 07/30/19 0227 07/31/19 0402  NA 135 139  K 4.1 3.8  CL 102 103  CO2 25 27  GLUCOSE 137* 98  BUN 22* 23*  CREATININE 0.78 0.74  CALCIUM 8.5* 9.0    IMAGING past 24 hours No results found.  CXR Portable 07/27/19 IMPRESSION: 1. Increased volume loss at the right lung base with streaky opacities, favor worsening atelectasis. Pneumonia is also considered in the setting of elevated temperature. 2. Minor left lung base atelectasis, with improvement from prior exam.   PHYSICAL EXAM     Neuro exam: Status post tracheostomy on trach collar, no sedation, patient awake but lethargic, nonverbal, not following commands consistently but appeared to raise his arm when I asked him, left gaze preference crosses the midline slightly, right-sided hemianopia, fundi reactive to light possibly asymmetric by 1 to 2 mm but both reactive equally, cannot perform funduscopy due to noncooperation, right facial droop, could not get him to protrude his tongue or open his mouth to look at the uvula, right hemiplegia.    ASSESSMENT/PLAN [name] with history of COPD, ongoing tobacco use, and psoriasis, presenting with dense LMCA syndrome. Attempt for Left ICA and Left M1 thrombectomy was done emergently  with TICI 2b flow. Unfortunately, the pt had post procedure hemorrhage and reocclusion of the vessels. F/u imaging suggested early edema and pt underwent prophylactic decompressive hemicraniectomy.  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 Stroke etiology unknown - given DVT requiring eventual AC and high mRS, will not do further embolic workup  Code Stroke CT head showed early hypodensity in nearly entire LMCA. ASPECTS 6-7 per notes.  CTA head & neck occlusion cervical ICA on the left. This is most likely due to dissection. The patient has minimal atherosclerotic disease.  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  There is poor collateral circulation on the left.   CTP CBF large core volume of 266 mL, perfusion volume 313 mL, mismatch volume 47 mL.  ASPECTS using CTP by Dr. Chestine Spore 6 or 7.   IR - TICI2c revascularization  MRI  Restricted diffusion throughout the majority of the left MCA and ACA vascular territories consistent with acute ischemia.  MRA - reocclusion of the intracranial left internal carotid artery. No flow related  signal is seen within the left middle cerebral artery.  CT Head 07/04/19 - Slightly increased rightward midline shift with a small amount of herniation beneath the anterior falx. Otherwise unchanged examination.  CT repeat 5/6 stable infarcts and SAH but increasing edema w/ midline shift now 21mm and herniation through crani site  CT repeat 5/21 decreased shift 5->48mm. Extensive L crani w/ buldging through defect. Still L ACA and MCA low density w/ petechial hemorrhage but no large hemorrhage.   2D Echo - EF 55 - 60%. No cardiac source of  emboli identified.   LDL 45  HgbA1c 5.5  Subq heparin for VTE prophylaxis  No antithrombotic prior to admission, now on Eliquis.  Therapy recommendations:  SNF   Disposition:  Pending - LTACH consulted however no insurance   Respiratory failure s/p Trach Trach wound  Intubated on vent  CCM on board  CXR 07/03/19- New consolidation in the right lower lobe and left infrahilar region concerning for pneumonia. Possible small layering right effusion.  CXR 5/3 mild pulm edema w/ atx vs infiltrate/aspiration  On cefepime 4/30>>5/2 for pneumonia  Augmentin 5/2>>5/4  Unasyn 5/5>>5/6  Trach placed 5/8 per CCM  WOC consulted for trach wound ->progressed to stage 3 pressure, for dressing change q3d and prn  Fever and elevated white count and tachycardia hence restarted on Vanc & Cefepime 5/11>>5/13  Ancef 5/14>>5/17  (pneumonia)  On trach collar since 5/14 w/ high FiO2 requirements  On 28% ATC  Downsized to cuffless trach 6 by CCM 5/24  Cerebral edema Hypernatremia   MRI - Restricted diffusion throughout the majority of the left MCA and ACA vascular territories consistent with acute ischemia.  CT Head 07/04/19 - Slightly increased rightward midline shift with a small amount of herniation beneath the anterior falx. Otherwise unchanged examination.  S/p decompressive left hemicraniectomy  4/28 Dr Franky Macho  CT 5/6 - stable infarcts and SAH but increasing edema w/ midline shift now 27mm and herniation through crani site  CT head 5/20 - decreased shift 5->32mm. Extensive L crani w/ buldging through defect. Still L ACA and MCA low density w/ petechial hemorrhage but no large hemorrhage.  completed keppra 2 wk course per NSG  Off 3% and D5NS  Okay to allow Na gradually normalize  Na normalized   Crani sutures out -> suture incision leaking 5/16 - s/p local suture  Sepsis, resolved Fever w/ leukocytosis HCAP, resolved, ? Recurrence Hypotension  TMax  102.7->98.5->98.8  Leukocytosis - 12.5->8.9->7.3  UA neg  Sputum moraxella, H. flu   On cefepime 4/30>>5/2; Augmentin 5/2>>5/4, Unasyn 5/5>>5/6 -> off  Blood culture 5/2 no growth  Blood cultures 5/7 - no growth   Resp cultures 5/7 - no growth  CXR 5/9 - streaky BLL opacilites atx/consolication/PNA. Possible R pleural effusion  CXR 5/10 w/ atx vs infiltrate  CXR 07/15/19 - Stable bibasilar atelectasis a small right pleural effusion. No new opacity evident. Stable cardiac silhouette  Sputum cx 5/11 reincubated   Blood Cx 5/11 no growth   Vanc 5/11>>5/13, Cefepime 5/11>>5/13  Ancef 5/14>>5/17  (pneumonia)  CXR 5/18 slight improvement in bibasilar atx  CXR 5/24 increased vol loss RLL w/ streaky opacities, worsening atx, ? PNA. Minor LLL atx.  Vanc 5/24>>off  Cefepime 5/24>>5/30, discontinued today total 7 days, WBCs now normal  UA - couldn't find -> reordered-> WNL  CXR 07/27/19 - Increased volume loss at the right lung base with streaky opacities, favor worsening atelectasis. Pneumonia is also considered in the setting of elevated temperature.  Contacted CCM to see  Bilateral Lower Ext DVT, extensive Possible PE given tachycardia  Venous US 07/11/2019 DVT + bilateral extensive  IVC filter placed 5/9  On eliquis now for DVT prophylaxis  Hypertension  Home meds:  None  Current meds: none scheduled  SBP goal < 160 due to hemorrhagic conversion . Long-term BP goal normotensive  Dysphagia   NPO  Coretrak 5/7  PEG placement 5/14  On TF @ 82  FW 150 Q4h; NS @ 100h   Transitioned to bolus feeding  Ileus, resolved  Abd distension w/ decreased BS  KUB 5/2 distended colonic loop  Miralax   Flexiseal following laxatives  On reglan, laxatives on hold, decrease fentanyl  Coretrak 5/7  PEG 5/14  TF resumed after PEG  Acute Renal Failure, resolved  Renal US - increased cortical echogenicity in B kidneys c/w renal dz. Increased echogenicity of  liver ?hepatic steatosis.  Urine studies - elevated protein creatinine ratio, Na normal, UN normal  Creatinine - 0.85  Diuresis - per CCM  Tobacco abuse  Current smoker  Smoking cessation counseling will be provided if able  Other Stroke Risk Factors  Obesity, Body mass index is 29.75 kg/m., recommend weight loss, diet and exercise as appropriate    Likely undiagnosed obstructive sleep apnea based on body habitus  Other Active Problems  Hypokalemia - resolved   Thrombocytopenia - resolved  Elevated LFT - AST/ALT 54/102, improved    Constipation. Miralax daily -> loose stool -> miralax discontinued  Charm trial for cytotoxic edema  Mild anemia - Hb - 12.6->11.0  Hospital day # 78  Personally examined patient and images, and have participated in and made any corrections needed to history, physical, neuro exam,assessment and plan as stated above.  I have personally obtained the history, evaluated lab date, reviewed imaging studies and agree with radiology interpretations.    Sarina Ill, MD Stroke Neurology   A total of 15 minutes was spent for the care of this patient, spent on counseling patient and family on different diagnostic and therapeutic options, counseling and coordination of care, riskd ans benefits of management, compliance, or risk factor reduction and education.   To contact Stroke Continuity provider, please refer to [email or web address] After hours, contact General Neurology

## 2019-08-03 LAB — GLUCOSE, CAPILLARY
Glucose-Capillary: 104 mg/dL — ABNORMAL HIGH (ref 70–99)
Glucose-Capillary: 108 mg/dL — ABNORMAL HIGH (ref 70–99)
Glucose-Capillary: 138 mg/dL — ABNORMAL HIGH (ref 70–99)
Glucose-Capillary: 155 mg/dL — ABNORMAL HIGH (ref 70–99)
Glucose-Capillary: 174 mg/dL — ABNORMAL HIGH (ref 70–99)
Glucose-Capillary: 177 mg/dL — ABNORMAL HIGH (ref 70–99)
Glucose-Capillary: 89 mg/dL (ref 70–99)

## 2019-08-03 NOTE — Progress Notes (Signed)
STROKE TEAM PROGRESS NOTE   INTERVAL HISTORY  Stable overnight, no changes  Vitals:   08/03/19 0721 08/03/19 0748 08/03/19 1121 08/03/19 1133  BP:  131/66  120/81  Pulse: 94 95 (!) 103 (!) 101  Resp: (!) 27 (!) 25 (!) 27 19  Temp:  99.3 F (37.4 C)  98.3 F (36.8 C)  TempSrc:  Oral  Oral  SpO2: 96% 97% 96% 97%  Weight:      Height:       CBC:  Recent Labs  Lab 07/30/19 0227 07/31/19 0402  WBC 8.9 7.3  HGB 11.3* 11.0*  HCT 36.0* 35.1*  MCV 94.7 95.9  PLT 168 168   Basic Metabolic Panel:  Recent Labs  Lab 07/30/19 0227 07/31/19 0402  NA 135 139  K 4.1 3.8  CL 102 103  CO2 25 27  GLUCOSE 137* 98  BUN 22* 23*  CREATININE 0.78 0.74  CALCIUM 8.5* 9.0    IMAGING past 24 hours No results found.   PHYSICAL EXAM   Neuro exam: Status post tracheostomy on trach collar, no sedation, patient awake but lethargic, opens eyes to voice, nonverbal, not following commands consistently but appeared to raise his arm when I asked him, left gaze preference crosses the midline slightly, right-sided hemianopia, PERRL possibly some mild anisocoria with left pupil smaller than right but equally reactive, cannot perform funduscopy due to noncooperation, right facial droop, could not get him to protrude his tongue or open his mouth to look at the uvula, right hemiplegia, moving left side spontaneously and purposefully    ASSESSMENT/PLAN [name] with history of COPD, ongoing tobacco use, and psoriasis, presenting with dense LMCA syndrome. Attempt for Left ICA and Left M1 thrombectomy was done emergently with TICI 2b flow. Unfortunately, the pt had post procedure hemorrhage and reocclusion of the vessels. F/u imaging suggested early edema and pt underwent prophylactic decompressive hemicraniectomy.  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 Stroke etiology unknown - given DVT requiring eventual AC and high mRS, will not do further embolic workup  Code Stroke CT head showed early hypodensity in nearly entire LMCA. ASPECTS 6-7 per notes.  CTA head & neck occlusion cervical ICA on the left. This is most likely due to dissection. The patient has minimal atherosclerotic disease.  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  There is poor collateral circulation on the left.   CTP CBF large core volume of 266 mL, perfusion volume 313 mL, mismatch volume 47 mL.  ASPECTS using CTP by Dr. Chestine Spore 6 or 7.   IR - TICI2c revascularization  MRI  Restricted diffusion throughout the majority of the left MCA and ACA vascular territories consistent with acute ischemia.  MRA - reocclusion of the intracranial left internal carotid artery. No flow related signal is seen within the left middle cerebral artery.  CT Head 07/04/19 - Slightly increased rightward midline shift with a small amount of herniation beneath the anterior falx. Otherwise unchanged examination.  CT repeat 5/6 stable infarcts and SAH but increasing edema w/ midline shift now 29mm and herniation through crani site  CT repeat 5/21 decreased  shift 5->81mm. Extensive L crani w/ buldging through defect. Still L ACA and MCA low density w/ petechial hemorrhage but no large hemorrhage.   2D Echo - EF 55 - 60%. No cardiac source of emboli identified.   LDL 45  HgbA1c 5.5  Subq heparin for VTE prophylaxis  No antithrombotic prior to admission, now on Eliquis.  Therapy recommendations:  SNF   Disposition:  Pending - LTACH consulted however no insurance   Respiratory failure s/p Trach Trach wound  Intubated on vent  CCM on board  CXR 07/03/19- New consolidation in the right  lower lobe and left infrahilar region concerning for pneumonia. Possible small layering right effusion.  CXR 5/3 mild pulm edema w/ atx vs infiltrate/aspiration  On cefepime 4/30>>5/2 for pneumonia  Augmentin 5/2>>5/4  Unasyn 5/5>>5/6  Trach placed 5/8 per CCM  WOC consulted for trach wound ->progressed to stage 3 pressure, for dressing change q3d and prn  Fever and elevated white count and tachycardia hence restarted on Vanc & Cefepime 5/11>>5/13  Ancef 5/14>>5/17  (pneumonia)  On trach collar since 5/14 w/ high FiO2 requirements  On 28% ATC  Downsized to cuffless trach 6 by CCM 5/24  Cerebral edema Hypernatremia   MRI - Restricted diffusion throughout the majority of the left MCA and ACA vascular territories consistent with acute ischemia.  CT Head 07/04/19 - Slightly increased rightward midline shift with a small amount of herniation beneath the anterior falx. Otherwise unchanged examination.  S/p decompressive left hemicraniectomy  4/28 Dr Christella Noa  CT 5/6 - stable infarcts and SAH but increasing edema w/ midline shift now 13mm and herniation through crani site  CT head 5/20 - decreased shift 5->10mm. Extensive L crani w/ buldging through defect. Still L ACA and MCA low density w/ petechial hemorrhage but no large hemorrhage.  completed keppra 2 wk course per NSG  Off 3% and D5NS  Okay to allow Na gradually normalize  Na normalized   Crani sutures out -> suture incision leaking 5/16 - s/p local suture  Sepsis, resolved Fever w/ leukocytosis HCAP, resolved, ? Recurrence Hypotension  TMax 101.4   Leukocytosis - 7.3   UA neg  Sputum moraxella, H. flu   On cefepime 4/30>>5/2; Augmentin 5/2>>5/4, Unasyn 5/5>>5/6 -> off  Blood culture 5/2 no growth  Blood cultures 5/7 - no growth   Resp cultures 5/7 - no growth  CXR 5/9 - streaky BLL opacilites atx/consolication/PNA. Possible R pleural effusion  CXR 5/10 w/ atx vs infiltrate  CXR 07/15/19 - Stable  bibasilar atelectasis a small right pleural effusion. No new opacity evident. Stable cardiac silhouette  Sputum cx 5/11 reincubated   Blood Cx 5/11 no growth   Vanc 5/11>>5/13, Cefepime 5/11>>5/13  Ancef 5/14>>5/17  (pneumonia)  CXR 5/18 slight improvement in bibasilar atx  CXR 5/24 increased vol loss RLL w/ streaky opacities, worsening atx, ? PNA. Minor LLL atx.  Vanc 5/24>>off  Cefepime 5/24>>5/30, discontinued today total 7 days, WBCs now normal  UA - couldn't find -> reordered-> WNL  CXR 07/27/19 - Increased volume loss at the right lung base with streaky opacities, favor worsening atelectasis. Pneumonia is also considered in the setting of elevated temperature.  Contacted CCM to see  Bilateral Lower Ext DVT, extensive Possible PE given tachycardia  Venous US 07/11/2019 DVT + bilateral extensive  IVC filter placed 5/9  On eliquis now for DVT prophylaxis  Hypertension  Home meds:  None  Current meds: none scheduled  SBP goal < 160 due to hemorrhagic conversion .  Long-term BP goal normotensive  Dysphagia   NPO  Coretrak 5/7  PEG placement 5/14  On TF @ 55  FW 150 Q4h; NS @ 100h   Transitioned to bolus feeding  Ileus, resolved  Abd distension w/ decreased BS  KUB 5/2 distended colonic loop  Miralax   Flexiseal following laxatives  On reglan, laxatives on hold, decrease fentanyl  Coretrak 5/7  PEG 5/14  TF resumed after PEG  Acute Renal Failure, resolved  Renal US - increased cortical echogenicity in B kidneys c/w renal dz. Increased echogenicity of liver ?hepatic steatosis.  Urine studies - elevated protein creatinine ratio, Na normal, UN normal  Creatinine - 0.85  Diuresis - per CCM  Tobacco abuse  Current smoker  Smoking cessation counseling will be provided if able  Other Stroke Risk Factors  Obesity, Body mass index is 29.75 kg/m., recommend weight loss, diet and exercise as appropriate    Likely undiagnosed obstructive  sleep apnea based on body habitus  Other Active Problems  Hypokalemia - resolved   Thrombocytopenia - resolved  Elevated LFT - AST/ALT 54/102, improved    Constipation. Miralax daily -> loose stool -> miralax discontinued  Charm trial for cytotoxic edema  Mild anemia - Hb - 12.6->11.0  Hospital day # 23  Personally examined patient and images, and have participated in and made any corrections needed to history, physical, neuro exam,assessment and plan as stated above.  I have personally obtained the history, evaluated lab date, reviewed imaging studies and agree with radiology interpretations.   Naomie Dean, MD Stroke Neurology  A total of 15 minutes was spent for the care of this patient, spent on counseling patient and family on different diagnostic and therapeutic options, counseling and coordination of care, riskd ans benefits of management, compliance, or risk factor reduction and education.   To contact Stroke Continuity provider, please refer to [email or web address]. After hours, contact General Neurology

## 2019-08-03 NOTE — Consult Note (Signed)
WOC Nurse wound follow up Patient receiving care in [facility] 3W07. Wound type: healing MDRPI to right trach face plate. Measurement: 0.1 cm x 0.2 cm x no depth Wound IDH:WYSH Drainage (amount, consistency, odor) none Periwound: intact Dressing procedure/placement/frequency:split gauze under trach faceplate. Continue routine care and use of split gauze. Monitor the wound area(s) for worsening of condition such as: Signs/symptoms of infection,  Increase in size,  Development of or worsening of odor, Development of pain, or increased pain at the affected locations.  Notify the medical team if any of these develop.  Helmut Muster, RN, MSN, CWOCN, CNS-BC, pager [phone]

## 2019-08-03 NOTE — Progress Notes (Signed)
Physical Therapy Treatment Patient Details Name: Lee Hooper MRN: [id number] DOB: [birth year] Today's Date: 08/03/2019    History of Present Illness This 49 y.o. male admitted 4/28 with Rt sided hemiplegia and aphasia.  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  Repeat CT following procedure showed moderate basal ganglia hemorrhage vs. stroke   Repeat MRI revealed that the ICA and MCA re-occluded with change in FLAIR sequence excluding him from repeat thrombectomy.  He underwent a decompressive hemicraniectomy.  He waws intubated 4/28.  He developed an ileus 5/2. Trach 5/8. IVC filter placed 5/9.  Peg placement.  PMH includes: COPD, tobacco abuse, psoriasis    PT Comments    Patient seen for mobility progression. This session focused on bed mobility and sitting balance EOB. Pt pushing more heavily with L UE and functional transfer training deferred for safety. Pt requires max-total A +2 for all mobility.  Continue to progress as tolerated with anticipated d/c to SNF for further skilled PT services.     Follow Up Recommendations  SNF     Equipment Recommendations  Wheelchair (measurements PT);Wheelchair cushion (measurements PT);Hospital bed    Recommendations for Other Services       Precautions / Restrictions Precautions Precautions: Fall Precaution Comments: R hemi, trach, PEG, flexiseal; No Bone flap L head;crani Restrictions Weight Bearing Restrictions: No    Mobility  Bed Mobility Overal bed mobility: Needs Assistance Bed Mobility: Sit to Supine;Supine to Sit     Supine to sit: +2 for physical assistance;Max assist;HOB elevated Sit to supine: Total assist;+2 for physical assistance   General bed mobility comments: elevated HOB and going toward L side; pt assisted minimally with L UE/LE to get to EOB; pushing more with L UE this session; very briefly able to miantain midline sitting balance with L elbow propped on elevated HOB  Transfers                  General transfer comment: unable to attempt due to pt pushing heavily with L UE   Ambulation/Gait                 Stairs             Wheelchair Mobility    Modified Rankin (Stroke Patients Only) Modified Rankin (Stroke Patients Only) Pre-Morbid Rankin Score: No symptoms Modified Rankin: Severe disability     Balance Overall balance assessment: Needs assistance Sitting-balance support: Feet supported;Single extremity supported Sitting balance-Leahy Scale: Zero   Postural control: Right lateral lean;Other (comment)(anterior lean and pushing to R side with L UE)                                  Cognition Arousal/Alertness: Lethargic Behavior During Therapy: Flat affect;Restless Overall Cognitive Status: Difficult to assess Area of Impairment: Following commands;Safety/judgement                       Following Commands: Follows one step commands inconsistently;Follows one step commands with increased time Safety/Judgement: Decreased awareness of safety            Exercises      General Comments        Pertinent Vitals/Pain Pain Assessment: Faces Faces Pain Scale: No hurt    Home Living                      Prior Function  PT Goals (current goals can now be found in the care plan section) Progress towards PT goals: Progressing toward goals    Frequency    Min 2X/week      PT Plan Current plan remains appropriate    Co-evaluation              AM-PAC PT "6 Clicks" Mobility   Outcome Measure  Help needed turning from your back to your side while in a flat bed without using bedrails?: Total Help needed moving from lying on your back to sitting on the side of a flat bed without using bedrails?: Total Help needed moving to and from a bed to a chair (including a wheelchair)?: Total Help needed standing up from a chair using your arms (e.g., wheelchair or bedside chair)?: Total Help  needed to walk in hospital room?: Total Help needed climbing 3-5 steps with a railing? : Total 6 Click Score: 6    End of Session Equipment Utilized During Treatment: Oxygen Activity Tolerance: Patient tolerated treatment well Patient left: in bed;with call bell/phone within reach;with bed alarm set Nurse Communication: Mobility status;Need for lift equipment PT Visit Diagnosis: Other abnormalities of gait and mobility (R26.89);Hemiplegia and hemiparesis Hemiplegia - Right/Left: Right Hemiplegia - dominant/non-dominant: Dominant Hemiplegia - caused by: Nontraumatic intracerebral hemorrhage     Time: 1032-1101 PT Time Calculation (min) (ACUTE ONLY): 29 min  Charges:  $Therapeutic Activity: 23-37 mins                     Erline Levine, PTA Acute Rehabilitation Services Pager: [phone] Office: [phone]     Carolynne Edouard 08/03/2019, 1:42 PM

## 2019-08-04 ENCOUNTER — Inpatient Hospital Stay (HOSPITAL_COMMUNITY): Payer: Medicaid Other

## 2019-08-04 LAB — URINALYSIS, COMPLETE (UACMP) WITH MICROSCOPIC
Bilirubin Urine: NEGATIVE
Glucose, UA: NEGATIVE mg/dL
Hgb urine dipstick: NEGATIVE
Ketones, ur: NEGATIVE mg/dL
Leukocytes,Ua: NEGATIVE
Nitrite: NEGATIVE
Protein, ur: NEGATIVE mg/dL
Specific Gravity, Urine: 1.014 (ref 1.005–1.030)
pH: 7 (ref 5.0–8.0)

## 2019-08-04 LAB — BASIC METABOLIC PANEL
Anion gap: 8 (ref 5–15)
BUN: 22 mg/dL — ABNORMAL HIGH (ref 6–20)
CO2: 28 mmol/L (ref 22–32)
Calcium: 9.1 mg/dL (ref 8.9–10.3)
Chloride: 101 mmol/L (ref 98–111)
Creatinine, Ser: 0.66 mg/dL (ref 0.61–1.24)
GFR calc Af Amer: >60 mL/min (ref >60)
GFR calc non Af Amer: >60 mL/min (ref >60)
Glucose, Bld: 115 mg/dL — ABNORMAL HIGH (ref 70–99)
Potassium: 4.4 mmol/L (ref 3.5–5.1)
Sodium: 137 mmol/L (ref 135–145)

## 2019-08-04 LAB — GLUCOSE, CAPILLARY
Glucose-Capillary: 106 mg/dL — ABNORMAL HIGH (ref 70–99)
Glucose-Capillary: 110 mg/dL — ABNORMAL HIGH (ref 70–99)
Glucose-Capillary: 142 mg/dL — ABNORMAL HIGH (ref 70–99)
Glucose-Capillary: 175 mg/dL — ABNORMAL HIGH (ref 70–99)
Glucose-Capillary: 209 mg/dL — ABNORMAL HIGH (ref 70–99)
Glucose-Capillary: 96 mg/dL (ref 70–99)

## 2019-08-04 LAB — CBC
HCT: 37.1 % — ABNORMAL LOW (ref 39.0–52.0)
Hemoglobin: 11.8 g/dL — ABNORMAL LOW (ref 13.0–17.0)
MCH: 30.2 pg (ref 26.0–34.0)
MCHC: 31.8 g/dL (ref 30.0–36.0)
MCV: 94.9 fL (ref 80.0–100.0)
Platelets: 248 10*3/uL (ref 150–400)
RBC: 3.91 MIL/uL — ABNORMAL LOW (ref 4.22–5.81)
RDW: 18.1 % — ABNORMAL HIGH (ref 11.5–15.5)
WBC: 6.1 10*3/uL (ref 4.0–10.5)
nRBC: 0 % (ref 0.0–0.2)

## 2019-08-04 MED ORDER — LOPERAMIDE HCL 1 MG/7.5ML PO SUSP
2.0000 mg | Freq: Four times a day (QID) | ORAL | Status: DC
  Filled 2019-08-04: qty 15

## 2019-08-04 MED ORDER — LOPERAMIDE HCL 1 MG/7.5ML PO SUSP
2.0000 mg | Freq: Four times a day (QID) | ORAL | Status: DC
  Administered 2019-08-04 – 2019-08-16 (×44): 2 mg
  Filled 2019-08-04 (×54): qty 15

## 2019-08-04 NOTE — Progress Notes (Signed)
STROKE TEAM PROGRESS NOTE   INTERVAL HISTORY  Pt lying in bed, not in acute distress. Thumbs up to me on my arrival. However, he did not follow commands. He continues to have diarrhea, will do imodium. Low grade fever today 100.2, will check UA and CXR. Continue TF and FW. Later in the pm, pt was found to have CSF leakage from left side scalp. NSG called several times and eventually Dr. Christella Noa came in.   Vitals:   08/04/19 0833 08/04/19 1149 08/04/19 1201 08/04/19 1408  BP: (!) 153/98  137/85 (!) 148/86  Pulse: 99 (!) 107 (!) 104 97  Resp: 20 (!) 24 (!) 24 20  Temp: 99 F (37.2 C)  99.2 F (37.3 C) 100.2 F (37.9 C)  TempSrc: Oral  Oral Oral  SpO2: 98% 96% 97% 98%  Weight:      Height:       CBC:  Recent Labs  Lab 07/30/19 0227 07/31/19 0402  WBC 8.9 7.3  HGB 11.3* 11.0*  HCT 36.0* 35.1*  MCV 94.7 95.9  PLT 168 130   Basic Metabolic Panel:  Recent Labs  Lab 07/30/19 0227 07/31/19 0402  NA 135 139  K 4.1 3.8  CL 102 103  CO2 25 27  GLUCOSE 137* 98  BUN 22* 23*  CREATININE 0.78 0.74  CALCIUM 8.5* 9.0    IMAGING past 24 hours No results found.   PHYSICAL EXAM  General- mildly obese middle-aged Caucasian male, s/p tracheostomy on trach collar  Ophthalmologic- fundi not visualized due to noncooperation.  Cardiovascular - Regular rate and rhythm.  Neuro - s/p tracheostomy on trach collar. Awake alert with eyes open. Still not following any simple commands. left gaze preference position, barely cross midline, notblinking to visual threat on the left, doll's eyespresent, PERRL. Corneal reflexweak on the right and brisk on the left, gag and coughpresent. Right facial droop. Tongue protrusion not cooperative. Spontaneous and purposeful movement left UE 4/5 and at least LE 2+/5. However, right UE and LE plegic. DTR 1+ and no babinski. Sensation, coordination and gait not tested.    ASSESSMENT/PLAN [name] with history of  COPD, ongoing tobacco use, and psoriasis, presenting with dense LMCA syndrome. Attempt for Left ICA and Left M1 thrombectomy was done emergently with TICI 2b flow. Unfortunately, the pt had post procedure hemorrhage and reocclusion of the vessels. F/u imaging suggested early edema and pt underwent prophylactic decompressive hemicraniectomy.  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 Stroke etiology unknown - given DVT requiring eventual AC and high mRS, will not do further embolic workup  Code Stroke CT head showed early hypodensity in nearly entire LMCA. ASPECTS 6-7 per notes.  CTA head & neck occlusion cervical ICA on the left. This is most likely due to dissection. The patient has minimal atherosclerotic disease.  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  There is poor collateral circulation on the left.   CTP CBF large core volume of 266 mL, perfusion volume 313 mL, mismatch volume 47 mL.  ASPECTS using CTP by Dr. Carlis Abbott 6 or 7.   IR - TICI2c revascularization  MRI  Restricted diffusion throughout the majority of the left MCA and ACA vascular territories consistent with acute ischemia.  MRA - reocclusion of the intracranial left internal carotid artery. No flow related signal is seen within the left middle cerebral artery.  CT Head 07/04/19 - Slightly increased rightward midline shift with a small amount of herniation beneath the anterior falx. Otherwise unchanged examination.  CT repeat 5/6 stable infarcts and SAH but increasing edema w/ midline shift now 79mm and herniation through crani site  CT repeat 5/21 decreased shift 5->64mm. Extensive L crani w/ buldging  through defect. Still L ACA and MCA low density w/ petechial hemorrhage but no large hemorrhage.   2D Echo - EF 55 - 60%. No cardiac source of emboli identified.   LDL 45  HgbA1c 5.5  Subq heparin for VTE prophylaxis  No antithrombotic prior to admission, now on Eliquis.  Therapy recommendations:  SNF   Disposition:  Pending - LTACH consulted however no insurance   Respiratory failure s/p Trach Trach wound  Intubated on vent  CCM on board  CXR 07/03/19- New consolidation in the right lower lobe and left infrahilar region concerning for pneumonia. Possible small layering right effusion.  CXR 5/3 mild pulm edema w/ atx vs infiltrate/aspiration  On cefepime 4/30>>5/2 for pneumonia  Augmentin 5/2>>5/4  Unasyn 5/5>>5/6  Trach placed 5/8 per CCM  WOC consulted for trach wound ->progressed to stage 3 pressure, for dressing change q3d and prn  Fever and elevated white count and tachycardia hence restarted on Vanc & Cefepime 5/11>>5/13  Ancef 5/14>>5/17  (pneumonia)  On trach collar since 5/14 w/ high FiO2 requirements  On 28% ATC  Downsized to cuffless trach 6 by [facility] 5/24  Continue trach collar and dressing to ulcer beneath collar  Cerebral edema, resolved Hypernatremia, resolved  MRI - Restricted diffusion throughout the majority of the left MCA and ACA vascular territories consistent with acute ischemia.  CT Head 07/04/19 - Slightly increased rightward midline shift with a small amount of herniation beneath the anterior falx. Otherwise unchanged examination.  S/p decompressive left hemicraniectomy  4/28 Dr Franky Macho  CT 5/6 - stable infarcts and SAH but increasing edema w/ midline shift now 25mm and herniation through crani site  CT head 5/20 - decreased shift 5->44mm. Extensive L crani w/ buldging through defect. Still L ACA and MCA low density w/ petechial hemorrhage but no large hemorrhage.  completed keppra 2 wk course per NSG  Off 3% and D5NS  Okay to allow  Na gradually normalize  Na normalized   Crani sutures out -> suture incision leaking 5/16 - s/p local suture  Sepsis, resolved Fever w/ leukocytosis, resolved HCAP, resolved, ? Recurrence Hypotension, resolved  TMax 100.2->100.3  Leukocytosis, resolved  UA neg  Sputum moraxella, H. flu   On cefepime 4/30>>5/2; Augmentin 5/2>>5/4, Unasyn 5/5>>5/6 -> off  Blood culture 5/2 no growth  Blood cultures 5/7 - no growth   Resp cultures 5/7 - no growth  CXR 5/9 - streaky BLL opacilites atx/consolication/PNA. Possible R pleural effusion  CXR 5/10 w/ atx vs infiltrate  CXR 07/15/19 - Stable bibasilar atelectasis a small right pleural effusion. No new opacity evident. Stable cardiac silhouette  Sputum cx 5/11 reincubated   Blood Cx 5/11 no growth   Vanc 5/11>>5/13, Cefepime 5/11>>5/13  Ancef 5/14>>5/17  (pneumonia)  CXR 5/18 slight improvement in bibasilar atx  CXR 5/24 increased vol loss RLL w/ streaky opacities, worsening atx, ? PNA. Minor LLL atx.  Vanc 5/24>>off  Cefepime 5/24>>5/30, discontinued  today total 7 days, WBCs now normal  UA WNL  CXR 07/27/19 - Increased volume loss at the right lung base with streaky opacities, favor worsening atelectasis. Pneumonia is also considered in the setting of elevated temperature.  Bilateral Lower Ext DVT, extensive Possible PE given tachycardia  Venous US 07/11/2019 DVT + bilateral extensive  IVC filter placed 5/9  On eliquis now for DVT prophylaxis  Hypertension  Home meds:  None  Current meds: none scheduled  SBP goal < 160 due to hemorrhagic conversion  Long-term BP goal normotensive  Dysphagia   NPO  Coretrak 5/7  PEG placement 5/14  FW 150 Q4h; NS @ 100h   Transitioned to bolus feeding  Ileus, resolved  Abd distension w/ decreased BS  KUB 5/2 distended colonic loop  Miralax   Flexiseal following laxatives  On reglan, laxatives on hold, decrease fentanyl  Coretrak 5/7  PEG 5/14  TF  resumed after PEG  CSF leakage  From left scalp incision site  NSG on board  Likely need additional suture  Acute Renal Failure, resolved  Renal US - increased cortical echogenicity in B kidneys c/w renal dz. Increased echogenicity of liver ?hepatic steatosis.  Urine studies - elevated protein creatinine ratio, Na normal, UN normal  Creatinine - 0.85  Diuresis - per CCM  Tobacco abuse  Current smoker  Smoking cessation counseling will be provided if able  Other Stroke Risk Factors  Obesity, Body mass index is 29.45 kg/m., recommend weight loss, diet and exercise as appropriate    Likely undiagnosed obstructive sleep apnea based on body habitus  Other Active Problems  Hypokalemia - resolved   Thrombocytopenia - resolved  Elevated LFT - AST/ALT 54/102, improved    Constipation. Miralax daily -> loose stool -> miralax discontinued -> add imodium   Charm trial for cytotoxic edema  Mild anemia - Hb - 12.6->11.0  Hospital day # 34  Marvel Plan, MD PhD Stroke Neurology 08/05/2019 12:26 AM     To contact Stroke Continuity provider, please refer to [email or web address]. After hours, contact General Neurology

## 2019-08-04 NOTE — Progress Notes (Signed)
Occupational Therapy Treatment Patient Details Name: Lee Hooper MRN: [id number] DOB: 1971/01/04 Today's Date: 08/04/2019    History of present illness This 49 y.o. male admitted 4/28 with Rt sided hemiplegia and aphasia.  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  Repeat CT following procedure showed moderate basal ganglia hemorrhage vs. stroke   Repeat MRI revealed that the ICA and MCA re-occluded with change in FLAIR sequence excluding him from repeat thrombectomy.  He underwent a decompressive hemicraniectomy.  He waws intubated 4/28.  He developed an ileus 5/2. Trach 5/8. IVC filter placed 5/9.  Peg placement.  PMH includes: COPD, tobacco abuse, psoriasis   OT comments  Patient continues to make steady progress towards goals in skilled OT session (pt seen in tandem with clinical specialist OT). Patient's session encompassed neuromuscular re-education as well as ADLs at EOB. Pt with increased ability to hold self up at EOB with use of the mirror and therapist braced on R side providing input at trunk and having pt weight bare through RUE. Pt noted to push minimally with LUE, however was easily redirected by placing a photo or washcloth in hand, or placing hand in lap. Pt noted to excel with increased reiterations of "lets sit up" with tactile cues provided at pecs to promote upright posture. At end of session pt able to hold self at EOB with min guard for 5-10 seconds before fatiguing. Discharge remains appropriate at this time; will continue to follow acutely.    Follow Up Recommendations  SNF;Supervision/Assistance - 24 hour    Equipment Recommendations  Other (comment)(Defer to next venue)    Recommendations for Other Services      Precautions / Restrictions Precautions Precautions: Fall Precaution Comments: R hemi, trach, PEG, flexiseal; No Bone flap L head;crani Restrictions Weight Bearing Restrictions: No       Mobility Bed Mobility Overal bed mobility:  Needs Assistance Bed Mobility: Sit to Supine;Supine to Sit Rolling: +2 for physical assistance;Max assist Sidelying to sit: +2 for physical assistance;Max assist   Sit to supine: +2 for physical assistance;Max assist   General bed mobility comments: Pt able to come to sitting with max A of 2, focused on positioning in session with therapist braced on R side facilitating upright posture with use of mirror and tactile cues to pecs, noted that pt was able to hold self up EOB at end of session with min gaurd for 5-10 seconds  Transfers                      Balance Overall balance assessment: Needs assistance Sitting-balance support: Feet supported;Single extremity supported Sitting balance-Leahy Scale: Fair Sitting balance - Comments: pt was able to range from mod of 1 to min guard in sitting EOB                                   ADL either performed or assessed with clinical judgement   ADL Overall ADL's : Needs assistance/impaired     Grooming: Wash/dry hands;Wash/dry face;Moderate assistance Grooming Details (indicate cue type and reason): Mod A and cues to use LUE when washing face, able to reach for washcloth appropriately 2x, however majority of the time requires assist at hand or elbow to initiate task, increased repetitions promoted full washing of face with mod A at end of session  Lower Body Dressing: Total assistance;Bed level Lower Body Dressing Details (indicate cue type and reason): to don socks             Functional mobility during ADLs: Maximal assistance;+2 for physical assistance General ADL Comments: Noted to demonstrate improvement in session with ability to sit at EOB with mirror in order to focus on finding midline and working on appropriate responses to one step commands     Vision   Vision Assessment?: Vision impaired- to be further tested in functional context Additional Comments: Pt focuses more on L gaze, however  is able to demonstrate tracking to midline on multiple occasions with utilization of mirror, minimal tracking to R noted with pictures, however this more demonstrated via the head instead of the eyes   Perception     Praxis      Cognition Arousal/Alertness: Lethargic;Awake/alert Behavior During Therapy: Flat affect Overall Cognitive Status: Impaired/Different from baseline Area of Impairment: Awareness;Attention;Following commands;Safety/judgement;Problem solving                   Current Attention Level: Focused   Following Commands: Follows one step commands inconsistently;Follows one step commands with increased time Safety/Judgement: Decreased awareness of safety;Decreased awareness of deficits Awareness: Intellectual Problem Solving: Slow processing;Decreased initiation;Difficulty sequencing;Requires verbal cues;Requires tactile cues General Comments: Requiring increased time to follow cues and commands        Exercises     Shoulder Instructions       General Comments      Pertinent Vitals/ Pain       Pain Assessment: Faces Faces Pain Scale: No hurt  Home Living                                          Prior Functioning/Environment              Frequency  Min 2X/week        Progress Toward Goals  OT Goals(current goals can now be found in the care plan section)  Progress towards OT goals: Progressing toward goals  Acute Rehab OT Goals Patient Stated Goal: none stated by patient OT Goal Formulation: Patient unable to participate in goal setting Time For Goal Achievement: 08/04/19 Potential to Achieve Goals: [place] Discharge plan remains appropriate    Co-evaluation                 AM-PAC OT "6 Clicks" Daily Activity     Outcome Measure   Help from another person eating meals?: Total Help from another person taking care of personal grooming?: A Lot Help from another person toileting, which includes using  toliet, bedpan, or urinal?: Total Help from another person bathing (including washing, rinsing, drying)?: Total Help from another person to put on and taking off regular upper body clothing?: Total Help from another person to put on and taking off regular lower body clothing?: Total 6 Click Score: 7    End of Session Equipment Utilized During Treatment: Other (comment)(trach collar)  OT Visit Diagnosis: Unsteadiness on feet (R26.81);Cognitive communication deficit (R41.841);Hemiplegia and hemiparesis;Muscle weakness (generalized) (M62.81);Apraxia (R48.2);Other symptoms and signs involving cognitive function;Pain Symptoms and signs involving cognitive functions: Cerebral infarction Hemiplegia - Right/Left: Right Hemiplegia - dominant/non-dominant: Dominant Hemiplegia - caused by: Cerebral infarction Pain - Right/Left: Right   Activity Tolerance Patient tolerated treatment well   Patient Left in bed;with call bell/phone within reach;with bed alarm  set   Nurse Communication Mobility status;Other (comment)(Condom cath off and pt rubbing head on L)        Time: 3893-7342 OT Time Calculation (min): 44 min  Charges: OT General Charges $OT Visit: 1 Visit OT Treatments $Self Care/Home Management : 8-22 mins $Neuromuscular Re-education: 8-22 mins  Lee Hooper, COTA/L Acute Rehabilitation Services [phone]   Lee Hooper 08/04/2019, 1:10 PM

## 2019-08-04 NOTE — Progress Notes (Addendum)
NAME:  Lee Hooper, MRN:  [id number], DOB:  1970/07/09, LOS: 34 ADMISSION DATE:  07/01/2019, CONSULTATION DATE:  07/01/19 REFERRING MD:  Aroor MD, CHIEF COMPLAINT:  CVA    Brief History   [name] is a 49 yo M who presented as a code stroke after being found aphasic and nonverbal by his wife. CT/CTA studies showed left ICA/MCA thrombosis and pt was taken for IR thrombectomy for left ICA and left M1 occlusions. Repeat head CT was obtained following the procedure out of concern for possible hemorrhage and showed moderate basal ganglia hemorrhage vs stroke. Repeat MRI revealed that the ICA and MCA re-occluded with change in FLAIR sequence excluding him from repeat thrombectomy. He underwent a L hemicraniectomy. He was intubated for airway protection.  Past Medical History  Psoriasis  COPD  Tobacco Use  Significant Hospital Events   4/28: Admit; L ICA/MCA Thrombectomy with re-occlusion --> left hemicraniectomy, intubated for acute hypoxic respiratory failure  5/3: NAE. Was taken off precedex yesterday and pt opened his eyes. Prednisone 50 mg qd was begun. He had a persistent fever around 38.2, HR 110s, BP 140s/60s. Pt required increased O2 with secretions; CXR showed mild edema with small effusions c/f aspiration vs opacification. Blood culture x2 and urine culture collected, NGTD. ABG pending. Currently satting 92% on PRVC FiO2 80, PEEP 10, Plateau 22. WBC declined to 10.5 from 11.1, Na 151 from 156. Tube feeds were held d/t distended colonic loop in RLQ on KUB, to be continued today. Thrombocytopenia improved to 93 from 81.  5/4: Patient remains intubated with spontaneous movement of left arm and leg.  5/5: NAE. Pt remains intubated on PRN fetanyl. Will open eyes and spontaneously move left leg and arm.  5/6: NAE overnight. Intubated with no sedation. 5/7: NAE. Marland Kitchen Completed Unasyn yesterday. VSS, spO2 remains in low 90s the majority of the time on 620/40%/+5. Morning labs significant  for HCO3 of 19. Otherwise unremarkable.  5/8: Ongoing fever.  Obtained ultrasound, positive for lower extremity DVT bilaterally.  Tracheostomy placed at bedside.,  Did have some SVT this responded to treating fever and rate control. 5/9: IVC filter placed 5/11: no acute events overnight. Pt with similar exam as previous however tachycardic and febrile (tmax102.7) with a bit increased wbc and new R infiltrate noted in base. Pan cx and start empiric abx. 5/12 Tolerated 8 hours of trach collar  5/17 completed antibiotics, added IV Lasix, for ongoing high supplemental oxygen requirements.  Because of this we delayed transfer out 5/18 improved oxygenation, improved x-ray.  Weaning oxygen.  Transitioning to aerosol trach collar 5/18 to 5/24: making slow progress on I&O balance but oxygen requirements much improved. DOAC started 5/23 w/ no new issues. Will ask RT to change to cuffless.  Consults:  IR PCCM  Neuro (Primary) Neurosurgery  Procedures:  4/28 IR - L ICA/MCA Thrombectomy 4/28 L hemicraniectomy 4/28 Intubation 5/08 Trach   Significant Diagnostic Tests:  4/28 Admit CT Head Code Stroke:  Hyperdense left ICA and MCA compatible with acute thrombus. No acute infarct or hemorrhage.  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 The patient has minimal atherosclerotic disease. The left internal carotid artery is occluded through the terminus and extending in the left M1 M2 and M3 branches. There is poor collateral circulation on the left. There is a large territory infarct involving the left hemisphere. Infarct volume 266 mL.  4/28 Admit  MR Brain:  Restricted diffusion throughout much of the left MCA vascular territory consistent with acute ischemia. Restricted diffusion consistent with acute ischemia is also present within the paramedian left frontoparietal lobes, although this is less well assessed  due to the degree of motion degradation at the level of the vertex. There is little if any corresponding T2/FLAIR hyperintensity at these sites. No significant mass effect. No midline shift.  4/28 Post Thrombectomy CT Head: : 1. 3.0 x 2.6 x 3.9 cm region of hyperdensity centered within the left basal ganglia, left subinsular region and inferomedial left temporal lobe likely reflecting a combination of parenchymal hematoma and contrast staining.  2. Edema with loss of gray-white differentiation within the left basal ganglia, left insula and anterior left temporal lobe, likely acute infarction. Subtle changes of acute infarction are also suspected within the paramedian left frontal lobe ACA vascular territory. 3. Scattered small-volume subarachnoid hemorrhage along the left cerebral hemisphere. 4. Regional mass effect with effacement of the left lateral ventricle temporal horn. No midline shift.  4/28 Post Thrombectomy MR Brain:  1. Restricted diffusion throughout the majority of the left MCA and ACA vascular territories consistent with acute ischemia. 2. No significant mass effect at this time. No midline shift. 3. The acute parenchymal hemorrhage and/or contrast staining centered within left basal ganglia and inferomedial left temporal lobe on prior head CT does not appear significantly changed. Based on the MR appearance, it is suspected that a significant component of this previously demonstrated hyperdensity reflects contrast staining. 4. Redemonstrated small volume subarachnoid hemorrhage overlying the left cerebral hemisphere. Small volume subarachnoid hemorrhage is also questioned along the right cerebral hemisphere posteriorly.  4/29 Post Hemicraniectomy CT head:  Complete left ACA/MCA territory infarction with swollen brain bulging through craniectomy defect. No midline shift or entrapment. Petechial hemorrhage at left basal ganglia. Extraaxial hemorrhage along surface of the infarct.    5/1 Head CT > Slightly increased rightward midline shift with a small amount of herniation beneath the anterior falx. Otherwise unchanged Examination.  5/6 Head CT: Left ACA and MCA territory infarct with unchanged degree of subarachnoid hemorrhage. Swelling has progressed with greater herniation through the craniectomy defect and 5 mm of midline shift.  5/8: Bilateral acute DVT involving the right common femoral vein right proximal profundal vein and right popliteal vein also left DVT involving same vessels  5/12 Renal US: Technically challenging exam due to patient's intubated status and difficulty with repositioning.  Diffusely increased cortical echogenicity in both kidneys compatible with medical renal disease.  Increased echogenicity of the liver with diminished through transmission, most often reflective of hepatic steatosis.  5/20 Ct H> significant motion artifact. Continued low density and swelling of L hemisphere in L ACA and MCA idstributions. Areas of petechial hemorrhage, no large hematoma. L craniectomy w bulging of brain through defect. Reduced mass effect with L to R shift now 63mm from 69mm previously  Micro Data:  4/28: SARS-CoV-2/Influenza A/Influenza B PCR: Negative  4/30: Respiratory culture: Abundant Moraxella Catarrhalis, Haemophilus Influenzae, beta lactamase positive  5/2: Urine culture: NGTD  5/2: BC x2: NGTD 5/7 resp >> H flu 5/7 bld >> negative 5/11 BCx2 >> negative 5/11 trach asp >> E coli  Antimicrobials:  4/30 - 5/2: Cefepime 5/2 - 5/4: Augmentin  5/5 - 5/6 unasyn   Vanc 5/11 >> 5/13 Cefepime 5/11 >> 5/13 Ancef 5/14 >> 5/17  Interim history/subjective:  Awake. Appears comfortable   Objective   Blood pressure (!) 153/98, pulse 99, temperature 99 F (37.2  C), temperature source Oral, resp. rate 20, height 6' (1.829 m), weight 98.5 kg, SpO2 98 %.    FiO2 (%):  [28 %] 28 %   Intake/Output Summary (Last 24 hours) at 08/04/2019 0931 Last data filed  at 08/04/2019 0450 Gross per 24 hour  Intake --  Output 2600 ml  Net -2600 ml   Filed Weights   08/01/19 0344 08/02/19 0500 08/04/19 0349  Weight: 100.6 kg 99.5 kg 98.5 kg    Examination: General: Middle-aged male is poorly responsive HEENT: #6 cuffless trach in place dressing posterior to trach noted over left radial scars noted Neuro: Does not follow commands at this time  CV: Heart sounds are regular  PULM: Decreased breath sounds throughout GI: soft, bsx4 active   Extremities: warm/dry, negative edema  Skin: no rashes or lesions    Resolved Hospital Problem list   treated HCAP with Moraxella and Haemophilus influenza, these were beta-lactamase positive, antibiotics were completed 5/6; COPD exacerbation. Ileus, AKI, hypernatremia Completed E. coli HCAP treatment on 5/18 Assessment & Plan:   Acute hypoxic respiratory failure with compromised airway. Failure to wean s/p tracheostomy. Hx of smoking with presumed COPD. Large Lt ICA/MCA CVA s/p thrombectomy and decompressive hemicraniectomy 2nd to cerebral edema and hemorrhage. Mild hypernatremia-->improved w/ Free water replacement  B/l lower extremity DVT.- s/p IVC filter 5/09, now on [facility] SVT.    Plan Continue trach collar Dressing to ulcer underneath the trach    Best practice:  Diet: EN  DVT prophylaxis:  IVC filter 5/9, on hep gtt GI prophylaxis: Protonix Mobility: PT/ OT Code Status: FULL Disposition: SDU   [name] Minor ACNP Acute Care Nurse Practitioner [place] Please consult [place] 08/04/2019, 9:32 AM  Attending:  Seen and examined independently.  Unfortunate case of a 49 y/o male with a large ICA/MCA thrombosis who has been aphasic/comatose.  Complicated course: DVT, has IVC filter, fevers/infections here.  Has #6 cuffless tracheostomy.  On exam he is unresponsive to me with voice or touch, lungs are clear to auscultation with normal effort.  Tracheostomy site is clean, 4x4 guaze  under trach but no redness or significant drainage.  MCA/ICA stroke Comatose Tracheostomy status  Plan: would continue tracheostomy care per routine, no need to consider decannulation in my mind.  Roselie Awkward, MD [place] [facility] Pager: [phone] Cell: [phone] If no response, call [phone]

## 2019-08-04 NOTE — Progress Notes (Addendum)
Patient's craniotomy incision is leaking, saturated pillow.  Paged Dr. Roda Shutters and neurosurgeon.   1600: No response from neurosurgery, called [facility]   16:25: No response, called [facility] again  17:25: Dr. Franky Macho coming to bedside

## 2019-08-05 ENCOUNTER — Inpatient Hospital Stay (HOSPITAL_COMMUNITY): Payer: Medicaid Other | Admitting: Certified Registered Nurse Anesthetist

## 2019-08-05 ENCOUNTER — Encounter (HOSPITAL_COMMUNITY)
Admission: EM | Disposition: A | Discharge status: Hospice | Payer: Self-pay | Source: Home / Self Care | Attending: Neurology

## 2019-08-05 ENCOUNTER — Encounter (HOSPITAL_COMMUNITY): Payer: Self-pay | Admitting: Neurosurgery

## 2019-08-05 DIAGNOSIS — I63512 Cerebral infarction due to unspecified occlusion or stenosis of left middle cerebral artery: Secondary | ICD-10-CM | POA: Diagnosis present

## 2019-08-05 HISTORY — PX: LESION EXCISION: SHX5167

## 2019-08-05 LAB — GLUCOSE, CAPILLARY
Glucose-Capillary: 85 mg/dL (ref 70–99)
Glucose-Capillary: 90 mg/dL (ref 70–99)
Glucose-Capillary: 98 mg/dL (ref 70–99)
Glucose-Capillary: 120 mg/dL — ABNORMAL HIGH (ref 70–99)
Glucose-Capillary: 131 mg/dL — ABNORMAL HIGH (ref 70–99)
Glucose-Capillary: 186 mg/dL — ABNORMAL HIGH (ref 70–99)

## 2019-08-05 LAB — SURGICAL PCR SCREEN
MRSA, PCR: NEGATIVE
Staphylococcus aureus: POSITIVE — AB

## 2019-08-05 LAB — CBC
HCT: 39.1 % (ref 39.0–52.0)
Hemoglobin: 12.2 g/dL — ABNORMAL LOW (ref 13.0–17.0)
MCH: 30.3 pg (ref 26.0–34.0)
MCHC: 31.2 g/dL (ref 30.0–36.0)
MCV: 97.3 fL (ref 80.0–100.0)
Platelets: 253 10*3/uL (ref 150–400)
RBC: 4.02 MIL/uL — ABNORMAL LOW (ref 4.22–5.81)
RDW: 18.2 % — ABNORMAL HIGH (ref 11.5–15.5)
WBC: 5.9 10*3/uL (ref 4.0–10.5)
nRBC: 0 % (ref 0.0–0.2)

## 2019-08-05 LAB — BASIC METABOLIC PANEL
Anion gap: 12 (ref 5–15)
BUN: 21 mg/dL — ABNORMAL HIGH (ref 6–20)
CO2: 27 mmol/L (ref 22–32)
Calcium: 9.2 mg/dL (ref 8.9–10.3)
Chloride: 99 mmol/L (ref 98–111)
Creatinine, Ser: 0.75 mg/dL (ref 0.61–1.24)
GFR calc Af Amer: >60 mL/min (ref >60)
GFR calc non Af Amer: >60 mL/min (ref >60)
Glucose, Bld: 101 mg/dL — ABNORMAL HIGH (ref 70–99)
Potassium: 4.2 mmol/L (ref 3.5–5.1)
Sodium: 138 mmol/L (ref 135–145)

## 2019-08-05 SURGERY — EXCISION, LESION, SKULL
Anesthesia: General | Site: Scalp

## 2019-08-05 MED ORDER — CEFAZOLIN SODIUM-DEXTROSE 2-3 GM-%(50ML) IV SOLR
INTRAVENOUS | Status: DC | PRN
  Administered 2019-08-05: 2 g via INTRAVENOUS

## 2019-08-05 MED ORDER — FENTANYL CITRATE (PF) 250 MCG/5ML IJ SOLN
INTRAMUSCULAR | Status: DC | PRN
  Administered 2019-08-05: 50 ug via INTRAVENOUS

## 2019-08-05 MED ORDER — LIDOCAINE-EPINEPHRINE 0.5 %-1:200000 IJ SOLN
INTRAMUSCULAR | Status: AC
  Filled 2019-08-05: qty 1

## 2019-08-05 MED ORDER — LIDOCAINE 2% (20 MG/ML) 5 ML SYRINGE
INTRAMUSCULAR | Status: DC | PRN
  Administered 2019-08-05: 100 mg via INTRAVENOUS

## 2019-08-05 MED ORDER — MIDAZOLAM HCL 2 MG/2ML IJ SOLN
INTRAMUSCULAR | Status: AC
  Filled 2019-08-05: qty 2

## 2019-08-05 MED ORDER — BACITRACIN ZINC 500 UNIT/GM EX OINT
TOPICAL_OINTMENT | CUTANEOUS | Status: DC | PRN
  Administered 2019-08-05: 1 via TOPICAL

## 2019-08-05 MED ORDER — MUPIROCIN 2 % EX OINT
TOPICAL_OINTMENT | Freq: Two times a day (BID) | CUTANEOUS | Status: DC
  Filled 2019-08-05: qty 22

## 2019-08-05 MED ORDER — DEXAMETHASONE SODIUM PHOSPHATE 10 MG/ML IJ SOLN
INTRAMUSCULAR | Status: DC | PRN
  Administered 2019-08-05: 4 mg via INTRAVENOUS

## 2019-08-05 MED ORDER — LACTATED RINGERS IV SOLN: INTRAVENOUS | Status: DC | PRN

## 2019-08-05 MED ORDER — PROPOFOL 10 MG/ML IV BOLUS
INTRAVENOUS | Status: AC
  Filled 2019-08-05: qty 20

## 2019-08-05 MED ORDER — SUGAMMADEX SODIUM 200 MG/2ML IV SOLN
INTRAVENOUS | Status: DC | PRN
  Administered 2019-08-05: 200 mg via INTRAVENOUS

## 2019-08-05 MED ORDER — ONDANSETRON HCL 4 MG/2ML IJ SOLN
INTRAMUSCULAR | Status: DC | PRN
  Administered 2019-08-05: 4 mg via INTRAVENOUS

## 2019-08-05 MED ORDER — FENTANYL CITRATE (PF) 250 MCG/5ML IJ SOLN
INTRAMUSCULAR | Status: AC
  Filled 2019-08-05: qty 5

## 2019-08-05 MED ORDER — PROPOFOL 10 MG/ML IV BOLUS
INTRAVENOUS | Status: DC | PRN
  Administered 2019-08-05: 200 mg via INTRAVENOUS

## 2019-08-05 MED ORDER — 0.9 % SODIUM CHLORIDE (POUR BTL) OPTIME
TOPICAL | Status: DC | PRN
  Administered 2019-08-05: 1000 mL

## 2019-08-05 MED ORDER — ROCURONIUM BROMIDE 10 MG/ML (PF) SYRINGE
PREFILLED_SYRINGE | INTRAVENOUS | Status: DC | PRN
  Administered 2019-08-05: 70 mg via INTRAVENOUS

## 2019-08-05 MED ORDER — BACITRACIN ZINC 500 UNIT/GM EX OINT
TOPICAL_OINTMENT | CUTANEOUS | Status: AC
  Filled 2019-08-05: qty 28.35

## 2019-08-05 SURGICAL SUPPLY — 44 items
APL SKNCLS STERI-STRIP NONHPOA (GAUZE/BANDAGES/DRESSINGS)
BAG DECANTER FOR FLEXI CONT (MISCELLANEOUS) ×2 IMPLANT
BENZOIN TINCTURE PRP APPL 2/3 (GAUZE/BANDAGES/DRESSINGS) ×2 IMPLANT
BLADE CLIPPER SURG (BLADE) ×2 IMPLANT
CANISTER SUCT 3000ML PPV (MISCELLANEOUS) ×4 IMPLANT
CARTRIDGE OIL MAESTRO DRILL (MISCELLANEOUS) ×2 IMPLANT
CLOSURE WOUND 1/2 X4 (GAUZE/BANDAGES/DRESSINGS)
COVER WAND RF STERILE (DRAPES) ×2 IMPLANT
DIFFUSER DRILL AIR PNEUMATIC (MISCELLANEOUS) ×2 IMPLANT
DRAPE LAPAROTOMY 100X72 PEDS (DRAPES) IMPLANT
DRAPE LAPAROTOMY 100X72X124 (DRAPES) IMPLANT
DRAPE POUCH INSTRU U-SHP 10X18 (DRAPES) ×2 IMPLANT
DRAPE SURG 17X23 STRL (DRAPES) ×8 IMPLANT
ELECT REM PT RETURN 9FT ADLT (ELECTROSURGICAL) ×4
ELECTRODE REM PT RTRN 9FT ADLT (ELECTROSURGICAL) ×2 IMPLANT
GAUZE 4X4 16PLY RFD (DISPOSABLE) ×2 IMPLANT
GAUZE SPONGE 4X4 12PLY STRL (GAUZE/BANDAGES/DRESSINGS) ×2 IMPLANT
GLOVE BIOGEL PI IND STRL 7.5 (GLOVE) IMPLANT
GLOVE BIOGEL PI INDICATOR 7.5 (GLOVE) ×2
GLOVE ECLIPSE 6.5 STRL STRAW (GLOVE) ×4 IMPLANT
GLOVE EXAM NITRILE XL STR (GLOVE) IMPLANT
GLOVE SURG SS PI 7.0 STRL IVOR (GLOVE) ×2 IMPLANT
GOWN STRL REUS W/ TWL LRG LVL3 (GOWN DISPOSABLE) IMPLANT
GOWN STRL REUS W/ TWL XL LVL3 (GOWN DISPOSABLE) IMPLANT
GOWN STRL REUS W/TWL LRG LVL3 (GOWN DISPOSABLE) ×8
GOWN STRL REUS W/TWL XL LVL3 (GOWN DISPOSABLE)
KIT BASIN OR (CUSTOM PROCEDURE TRAY) ×4 IMPLANT
KIT TURNOVER KIT B (KITS) ×4 IMPLANT
NEEDLE HYPO 22GX1.5 SAFETY (NEEDLE) IMPLANT
NS IRRIG 1000ML POUR BTL (IV SOLUTION) ×4 IMPLANT
OIL CARTRIDGE MAESTRO DRILL (MISCELLANEOUS)
PACK LAMINECTOMY NEURO (CUSTOM PROCEDURE TRAY) ×4 IMPLANT
PAD ARMBOARD 7.5X6 YLW CONV (MISCELLANEOUS) ×12 IMPLANT
STRIP CLOSURE SKIN 1/2X4 (GAUZE/BANDAGES/DRESSINGS) ×2 IMPLANT
SUT ETHILON 3 0 FSL (SUTURE) ×2 IMPLANT
SUT VIC AB 1 CT1 18XBRD ANBCTR (SUTURE) ×2 IMPLANT
SUT VIC AB 1 CT1 8-18 (SUTURE)
SUT VIC AB 2-0 CP2 18 (SUTURE) ×2 IMPLANT
SUT VIC AB 2-0 CT2 18 VCP726D (SUTURE) ×2 IMPLANT
SWAB COLLECTION DEVICE MRSA (MISCELLANEOUS) ×2 IMPLANT
SWAB CULTURE ESWAB REG 1ML (MISCELLANEOUS) ×2 IMPLANT
TOWEL GREEN STERILE (TOWEL DISPOSABLE) ×4 IMPLANT
TOWEL GREEN STERILE FF (TOWEL DISPOSABLE) ×4 IMPLANT
WATER STERILE IRR 1000ML POUR (IV SOLUTION) ×4 IMPLANT

## 2019-08-05 NOTE — Progress Notes (Signed)
PCR swab positive for staph aureus, Dr. Roda Shutters notified

## 2019-08-05 NOTE — Transfer of Care (Signed)
Immediate Anesthesia Transfer of Care Note  Patient: Lee Hooper  Procedure(s) Performed: Scalp Wound Revision (N/A )  Patient Location: PACU  Anesthesia Type:General  Level of Consciousness: unresponsive  Airway & Oxygen Therapy: Patient Spontanous Breathing and Patient connected to tracheostomy mask oxygen  Post-op Assessment: Report given to RN and Post -op Vital signs reviewed and stable  Post vital signs: Reviewed and stable  Last Vitals:  Vitals Value Taken Time  BP 129/83 08/05/19 1437  Temp    Pulse 92 08/05/19 1442  Resp 17 08/05/19 1442  SpO2 97 % 08/05/19 1442  Vitals shown include unvalidated device data.  Last Pain:  Vitals:   08/05/19 0824  TempSrc: Axillary  PainSc:          Complications: No apparent anesthesia complications

## 2019-08-05 NOTE — Anesthesia Postprocedure Evaluation (Signed)
Anesthesia Post Note  Patient: Lee Hooper  Procedure(s) Performed: Scalp Wound Revision (N/A Scalp)     Patient location during evaluation: PACU Anesthesia Type: General Level of consciousness: patient remains intubated per anesthesia plan Pain management: pain level controlled Vital Signs Assessment: post-procedure vital signs reviewed and stable Respiratory status: patient connected to tracheostomy mask oxygen Cardiovascular status: stable Postop Assessment: no apparent nausea or vomiting Anesthetic complications: no    Last Vitals:  Vitals:   08/05/19 1624 08/05/19 1646  BP:  114/77  Pulse: 93 90  Resp: 18 (!) 22  Temp:  37 C  SpO2: 97% 97%    Last Pain:  Vitals:   08/05/19 1646  TempSrc: Axillary  PainSc:    Pain Goal:                   Caren Macadam

## 2019-08-05 NOTE — Anesthesia Preprocedure Evaluation (Signed)
Anesthesia Evaluation   Patient unresponsive    Reviewed: Allergy & Precautions, NPO status , Patient's Chart, lab work & pertinent test results  Airway Mallampati: Trach       Dental   Pulmonary Current Smoker and Patient abstained from smoking.,     + decreased breath sounds      Cardiovascular  Rhythm:Regular Rate:Normal     Neuro/Psych CVA, Residual Symptoms    GI/Hepatic   Endo/Other    Renal/GU      Musculoskeletal   Abdominal Normal abdominal exam  (+)   Peds  Hematology   Anesthesia Other Findings   Reproductive/Obstetrics                             Anesthesia Physical Anesthesia Plan  ASA: III  Anesthesia Plan: General   Post-op Pain Management:    Induction:   PONV Risk Score and Plan:   Airway Management Planned: Tracheostomy  Additional Equipment: None  Intra-op Plan:   Post-operative Plan: Post-operative intubation/ventilation  Informed Consent: I have reviewed the patients History and Physical, chart, labs and discussed the procedure including the risks, benefits and alternatives for the proposed anesthesia with the patient or authorized representative who has indicated his/her understanding and acceptance.       Plan Discussed with: CRNA  Anesthesia Plan Comments:         Anesthesia Quick Evaluation

## 2019-08-05 NOTE — Progress Notes (Signed)
Nutrition Follow-up  DOCUMENTATION CODES:   Obesity unspecified  INTERVENTION:  Continue bolus feeding via PEG: -2 cans Osmolite 1.5 cal (474ml) TID  -30ml Pro-stat TID -150ml free water Q4H  Tube feeding regimen will provide 2430 kcals, 134 grams of protein, 1086ml free water (1986ml total free water with flushes)   Continue 1 packet Juven BID via PEG, each packet provides 95 calories, 2.5 grams of protein (collagen), and 9.8 grams of carbohydrate (3 grams sugar); also contains 7 grams of L-arginine and L-glutamine, 300 mg vitamin C, 15 mg vitamin E, 1.2 mcg vitamin B-12, 9.5 mg zinc, 200 mg calcium, and 1.5 g  Calcium Beta-hydroxy-Beta-methylbutyrate to support wound healing   NUTRITION DIAGNOSIS:   Inadequate oral intake related to inability to eat as evidenced by NPO status.  Ongoing.  GOAL:   Patient will meet greater than or equal to 90% of their needs  Met with TF.   MONITOR:   TF tolerance  REASON FOR ASSESSMENT:   Consult, Ventilator Enteral/tube feeding initiation and management  ASSESSMENT:   Pt with PMH of smoking and COPD admitted with L ICA/MCA s/p IR for revascularization however pt had re-occlusion now s/p emergent L hemicraniectomy.  4/30 cortrak placed; tip gastric 5/2 TF held due to ileus 5/6 trickle TF started 5/8 trach placed; IVC filter placed 5/14 PEG placed  Pt unable to answer questions at this time. Per RN, pt tolerating TF well.  Current TF via PEG: 474ml Osmolite 1.5 cal TID, 30ml Pro-stat TID, 150ml free water Q4H, 1 packet Juven BID  UOP: 2,300ml x24 hours I/O: +2,004.5ml since admit  Labs reviewed. CBGs 85-90-98 Medications reviewed and include: imodium, MVI  Diet Order:   Diet Order            Diet NPO time specified  Diet effective midnight              EDUCATION NEEDS:   No education needs have been identified at this time  Skin:  Skin Assessment: Skin Integrity Issues: Skin Integrity Issues:: Stage III,  Incisions Stage III: R neck Incisions: head, R neck  Last BM:  6/1 type 7  Height:   Ht Readings from Last 1 Encounters:  07/22/19 6' (1.829 m)    Weight:   Wt Readings from Last 1 Encounters:  08/05/19 96.5 kg    Ideal Body Weight:  80.9 kg  BMI:  Body mass index is 28.85 kg/m.  Estimated Nutritional Needs:   Kcal:  2300-2500  Protein:  125-145 grams  Fluid:  2 L/day    , MS, RD, LDN RD pager number and weekend/on-call pager number located in Amion.  

## 2019-08-05 NOTE — Progress Notes (Signed)
STROKE TEAM PROGRESS NOTE   INTERVAL HISTORY  No acute event overnight. Yesterday pm, he was found to have again CSF leakage from hemicrani incision site. Dr. Christella Noa was informed and he discussed with wife, will take pt to OR for wound management today.   Vitals:   08/05/19 0500 08/05/19 0824 08/05/19 0848 08/05/19 1139  BP:  111/75    Pulse:  83 87 94  Resp:  (!) 24 (!) 24 (!) 24  Temp:  98.7 F (37.1 C)    TempSrc:  Axillary    SpO2:  98% 97% 96%  Weight: 96.5 kg     Height:       CBC:  Recent Labs  Lab 08/04/19 1518 08/05/19 0616  WBC 6.1 5.9  HGB 11.8* 12.2*  HCT 37.1* 39.1  MCV 94.9 97.3  PLT 248 341   Basic Metabolic Panel:  Recent Labs  Lab 08/04/19 1518 08/05/19 0616  NA 137 138  K 4.4 4.2  CL 101 99  CO2 28 27  GLUCOSE 115* 101*  BUN 22* 21*  CREATININE 0.66 0.75  CALCIUM 9.1 9.2    IMAGING past 24 hours DG CHEST PORT 1 VIEW  Result Date: 08/04/2019 CLINICAL DATA:  Fever EXAM: PORTABLE CHEST 1 VIEW COMPARISON:  07/27/2019 FINDINGS: Tracheostomy is unchanged. Airspace opacity at the right lung base could reflect atelectasis or pneumonia and is similar prior study. Mild elevation of the right hemidiaphragm. No confluent opacity on the left. No effusions or acute bony abnormality. IMPRESSION: Stable right basilar airspace opacity which could reflect atelectasis or pneumonia. Electronically Signed   By: Rolm Baptise M.D.   On: 08/04/2019 17:24     PHYSICAL EXAM   General- mildly obese middle-aged Caucasian male, s/p tracheostomy on trach collar  Ophthalmologic- fundi not visualized due to noncooperation.  Cardiovascular - Regular rate and rhythm.  Neuro - s/p tracheostomy on trach collar. Awake alert with eyes open. Still not following any simple commands. left gaze preference position, barely cross midline, notblinking to visual threat on the left, doll's eyespresent, PERRL. Corneal reflexweak on the right and brisk on the left, gag and  coughpresent. Right facial droop. Tongue protrusion not cooperative. Spontaneous and purposeful movement left UE 4/5 and at least LE 2+/5. However, right UE and LE plegic. DTR 1+ and no babinski. Sensation, coordination and gait not tested.    ASSESSMENT/PLAN Lee Hooper is a 49 y.o. male with history of COPD, ongoing tobacco use, and psoriasis, presenting with dense LMCA syndrome. Attempt for Left ICA and Left M1 thrombectomy was done emergently with TICI 2b flow. Unfortunately, the pt had post procedure hemorrhage and reocclusion of the vessels. F/u imaging suggested early edema and pt underwent prophylactic decompressive hemicraniectomy.  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 Stroke etiology unknown - given DVT requiring eventual AC and high mRS, will not do further embolic workup  Code Stroke CT head showed early hypodensity in nearly entire LMCA. ASPECTS 6-7 per notes.  CTA head & neck occlusion cervical ICA on the left. This is most likely due to dissection. The patient has minimal atherosclerotic disease.  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  There is poor collateral circulation on the left.   CTP CBF large core volume of 266 mL, perfusion volume 313 mL, mismatch volume 47 mL.  ASPECTS using CTP by Dr. Chestine Spore 6 or 7.   IR - TICI2c revascularization  MRI  Restricted diffusion throughout the majority of the left MCA and ACA vascular territories consistent with acute ischemia.  MRA - reocclusion of the intracranial left internal carotid artery. No flow related signal is seen within the left middle cerebral artery.  CT Head 07/04/19 - Slightly  increased rightward midline shift with a small amount of herniation beneath the anterior falx. Otherwise unchanged examination.  CT repeat 5/6 stable infarcts and SAH but increasing edema w/ midline shift now 10mm and herniation through crani site  CT repeat 5/21 decreased shift 5->38mm. Extensive L crani w/ buldging through defect. Still L ACA and MCA low density w/ petechial hemorrhage but no large hemorrhage.   2D Echo - EF 55 - 60%. No cardiac source of emboli identified.   LDL 45  HgbA1c 5.5  Subq heparin for VTE prophylaxis  No antithrombotic prior to admission, now on Eliquis.  Therapy recommendations:  SNF   Disposition:  Pending - LTACH consulted however no insurance   Respiratory failure s/p Trach Trach wound  Intubated on vent  CCM on board  CXR 07/03/19- New consolidation in the right lower lobe and left infrahilar region concerning for pneumonia. Possible small layering right effusion.  CXR 5/3 mild pulm edema w/ atx vs infiltrate/aspiration  On cefepime 4/30>>5/2 for pneumonia  Augmentin 5/2>>5/4  Unasyn 5/5>>5/6  Trach placed 5/8 per CCM  WOC consulted for trach wound ->progressed to stage 3 pressure, for dressing change q3d and prn  Fever and elevated white count and tachycardia hence restarted on Vanc & Cefepime 5/11>>5/13  Ancef 5/14>>5/17  (pneumonia)  On trach collar since 5/14 w/ high FiO2 requirements  On 28% ATC  Downsized to cuffless trach 6 by [facility] 5/24  Continue trach collar and dressing to ulcer beneath collar  Cerebral edema, resolved Hypernatremia, resolved  MRI - Restricted diffusion throughout the majority of the left MCA and ACA vascular territories consistent with acute ischemia.  CT Head 07/04/19 - Slightly increased rightward midline shift with a small amount of herniation beneath the anterior falx. Otherwise unchanged examination.  S/p decompressive left hemicraniectomy  4/28 Dr Franky Macho  CT 5/6 - stable infarcts and SAH but  increasing edema w/ midline shift now 37mm and herniation through crani site  CT head 5/20 - decreased shift 5->24mm. Extensive L crani w/ buldging through defect. Still L ACA and MCA low density w/ petechial hemorrhage but no large hemorrhage.  completed keppra 2 wk course per NSG  Off 3% and D5NS  Okay to allow Na gradually normalize  Na normalized   Sepsis, resolved Fever w/ leukocytosis, resolved HCAP, resolved, ? Recurrence Hypotension, resolved  TMax 100.2->100.3  Leukocytosis, resolved  UA neg  Sputum moraxella, H. flu   On cefepime 4/30>>5/2; Augmentin 5/2>>5/4, Unasyn 5/5>>5/6 -> off  Blood culture 5/2 no growth  Blood cultures 5/7 - no growth   Resp cultures 5/7 - no growth  CXR 5/9 - streaky BLL opacilites atx/consolication/PNA. Possible R pleural effusion  CXR 5/10 w/ atx vs infiltrate  CXR 07/15/19 - Stable bibasilar atelectasis a small right pleural effusion. No new opacity evident. Stable cardiac silhouette  Sputum cx 5/11 reincubated   Blood Cx 5/11 no growth   Vanc 5/11>>5/13, Cefepime 5/11>>5/13  Ancef 5/14>>5/17  (pneumonia)  CXR 5/18 slight improvement in bibasilar atx  CXR 5/24 increased vol loss RLL w/ streaky opacities, worsening atx, ? PNA. Minor LLL atx.  Vanc 5/24>>off  Cefepime 5/24>>5/30, discontinued today total 7 days, WBCs now normal  UA WNL  CXR 07/27/19 - Increased volume loss at the right lung base with streaky opacities, favor worsening atelectasis. Pneumonia is also considered in the setting of elevated temperature.  Bilateral Lower Ext DVT, extensive Possible PE given tachycardia  Venous US 07/11/2019 DVT + bilateral extensive  IVC filter placed 5/9  On eliquis now for DVT prophylaxis  Hypertension  Home meds:  None  Current meds: none scheduled  SBP goal < 160 due to hemorrhagic conversion . Long-term BP goal normotensive  Dysphagia   NPO  Coretrak 5/7  PEG placement 5/14  FW 150 Q4h   On bolus  TF  Ileus, resolved  Abd distension w/ decreased BS  KUB 5/2 distended colonic loop  Miralax   Flexiseal following laxatives  On reglan, laxatives on hold, decrease fentanyl  Coretrak 5/7  PEG 5/14  TF resumed after PEG  CSF leakage  Crani sutures out -> suture incision leaking 5/16 - s/p local suture  Again From left scalp incision site 6/1  NSG on board ([facility])  Dr. Franky Macho taking pt to OR for wound management today  Acute Renal Failure, resolved  Renal US - increased cortical echogenicity in B kidneys c/w renal dz. Increased echogenicity of liver ?hepatic steatosis.  Urine studies - elevated protein creatinine ratio, Na normal, UN normal  Creatinine - 0.66->0.75  Diuresis - per CCM in ICU  Tobacco abuse  Current smoker  Smoking cessation counseling will be provided if able  Other Stroke Risk Factors  Obesity, Body mass index is 28.85 kg/m., recommend weight loss, diet and exercise as appropriate    Likely undiagnosed obstructive sleep apnea based on body habitus  Other Active Problems  Hypokalemia - resolved   Thrombocytopenia - resolved  Elevated LFT - AST/ALT 54/102, improved    Constipation. Miralax daily -> loose stool -> miralax discontinued -> add imodium   Charm trial for cytotoxic edema  Mild anemia - Hb [facility] day # 35   Marvel Plan, MD PhD Stroke Neurology 08/05/2019 2:16 PM     To contact Stroke Continuity provider, please refer to [email or web address]. After hours, contact General Neurology

## 2019-08-05 NOTE — Anesthesia Procedure Notes (Signed)
Date/Time: 08/05/2019 1:56 PM Performed by: Lucinda Dell, CRNA Pre-anesthesia Checklist: Patient identified, Suction available, Emergency Drugs available, Patient being monitored and Timeout performed Patient Re-evaluated:Patient Re-evaluated prior to induction Oxygen Delivery Method: Circle system utilized Preoxygenation: Pre-oxygenation with 100% oxygen Induction Type: Tracheostomy and IV induction Tube size: 6.0 mm Number of attempts: 1 Placement Confirmation: positive ETCO2 and breath sounds checked- equal and bilateral Secured at: 13 cm Tube secured with: Tape Comments: Patient has uncuffed trach on trach collar. Smooth IV induction. Removed trach and placed 6.0 ETT without difficulty. Atraumatic. +EtCO2 +BBS

## 2019-08-05 NOTE — Progress Notes (Addendum)
Dr. Lindie Spruce came to bedside to talk to Corbyn's wife- recommended Passy-Muir valve. Paged speech

## 2019-08-06 LAB — CBC
HCT: 37.8 % — ABNORMAL LOW (ref 39.0–52.0)
Hemoglobin: 11.9 g/dL — ABNORMAL LOW (ref 13.0–17.0)
MCH: 29.8 pg (ref 26.0–34.0)
MCHC: 31.5 g/dL (ref 30.0–36.0)
MCV: 94.5 fL (ref 80.0–100.0)
Platelets: 260 10*3/uL (ref 150–400)
RBC: 4 MIL/uL — ABNORMAL LOW (ref 4.22–5.81)
RDW: 17.6 % — ABNORMAL HIGH (ref 11.5–15.5)
WBC: 6.6 10*3/uL (ref 4.0–10.5)
nRBC: 0 % (ref 0.0–0.2)

## 2019-08-06 LAB — GLUCOSE, CAPILLARY
Glucose-Capillary: 120 mg/dL — ABNORMAL HIGH (ref 70–99)
Glucose-Capillary: 85 mg/dL (ref 70–99)
Glucose-Capillary: 111 mg/dL — ABNORMAL HIGH (ref 70–99)
Glucose-Capillary: 118 mg/dL — ABNORMAL HIGH (ref 70–99)
Glucose-Capillary: 119 mg/dL — ABNORMAL HIGH (ref 70–99)

## 2019-08-06 LAB — BASIC METABOLIC PANEL
Anion gap: 7 (ref 5–15)
BUN: 17 mg/dL (ref 6–20)
CO2: 28 mmol/L (ref 22–32)
Calcium: 9 mg/dL (ref 8.9–10.3)
Chloride: 99 mmol/L (ref 98–111)
Creatinine, Ser: 0.66 mg/dL (ref 0.61–1.24)
GFR calc Af Amer: >60 mL/min (ref >60)
GFR calc non Af Amer: >60 mL/min (ref >60)
Glucose, Bld: 120 mg/dL — ABNORMAL HIGH (ref 70–99)
Potassium: 4.2 mmol/L (ref 3.5–5.1)
Sodium: 134 mmol/L — ABNORMAL LOW (ref 135–145)

## 2019-08-06 NOTE — Progress Notes (Signed)
Occupational Therapy Treatment Patient Details Name: Lee Hooper MRN: [id number] DOB: 1970/05/27 Today's Date: 08/06/2019    History of present illness This 49 y.o. male admitted 4/28 with Rt sided hemiplegia and aphasia.  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  Repeat CT following procedure showed moderate basal ganglia hemorrhage vs. stroke   Repeat MRI revealed that the ICA and MCA re-occluded with change in FLAIR sequence excluding him from repeat thrombectomy.  He underwent a decompressive hemicraniectomy.  He waws intubated 4/28.  He developed an ileus 5/2. Trach 5/8. IVC filter placed 5/9.  Peg placement.  PMH includes: COPD, tobacco abuse, psoriasis   OT comments  Treatment session with focus on initiation, sequencing, following commands during self-care tasks and functional mobility.  Pt asleep upon arrival but with increased time pt able to maintain eyes open to engage in therapy session.  Provided hand over hand assist for initiation and sequencing when washing face.  Max - total +2 for bed  Mobility due to Rt weakness.  Pt unable to maintain unsupported sitting balance to engage in functional reaching while seated EOB.  Utilized Ship broker for visual feedback for sitting balance, with pt able to identify reflection x2. Engaged in sit > stand with PTA with focus on facilitating anterior weight shift.  Pt able to initiate during sit > stand but unable to maintain standing due to increased pushing.   Assessed progress towards goals, pt still appropriate for OT therapy and would benefit from continued OT services acutely to address self-care tasks and functional mobility.  Continue POC.   Follow Up Recommendations  SNF;Supervision/Assistance - 24 hour    Equipment Recommendations  Other (comment)(defer to next venue)       Precautions / Restrictions Precautions Precautions: Fall Precaution Comments: R hemi, trach, PEG, flexiseal; No Bone flap L head;crani        Mobility Bed Mobility Overal bed mobility: Needs Assistance Bed Mobility: Sit to Supine;Supine to Sit Rolling: +2 for physical assistance;Max assist Sidelying to sit: +2 for physical assistance;Max assist     Sit to sidelying: Total assist;+2 for physical assistance General bed mobility comments: Pt able to come to sitting with max-total +2 with focus on sequencing and weight shifting for sidelying to sitting.  Max assist for sitting balance without UE support, Min A when stabilizing on bed rail with LUE.  Transfers Overall transfer level: Needs assistance Equipment used: 2 person hand held assist Transfers: Sit to/from Stand Sit to Stand: Max assist;+2 physical assistance         General transfer comment: Engaged in sit > stand from EOB with Max A +2, pt able to engage in partial sit > stand with facilitation from therapist for anterior weight shift.  Pt unable to come to full upright standing due to fatigue and increased pushing in standing.    Balance Overall balance assessment: Needs assistance Sitting-balance support: Feet supported;Single extremity supported Sitting balance-Leahy Scale: Fair Sitting balance - Comments: pt was able to range from max of 1 to min guard in sitting EOB dependent on UE support Postural control: Right lateral lean                                 ADL either performed or assessed with clinical judgement   ADL Overall ADL's : Needs assistance/impaired Eating/Feeding: NPO   Grooming: Wash/dry face;Moderate assistance Grooming Details (indicate cue type and reason): Mod  A and hand over hand assist to facilitate washing face.  Pt able to sustain grasp on wash cloth, however required hand over hand to bring hand to face to engage in washing face.  Pt able to wash aspects of face but required facilitation and repetitions to wash entire face             Lower Body Dressing: Total assistance;Bed level Lower Body Dressing Details  (indicate cue type and reason): to don socks   Toilet Transfer Details (indicate cue type and reason): defer d/t safety         Functional mobility during ADLs: Maximal assistance;+2 for physical assistance General ADL Comments: Pt required max-total assist +2 for bed mobility to come to sitting at EOB.  Utilized Ship broker for visual feedback for midline and upright sitting posture.  Pt utilizing LUE as bed rail at end of bed to maintain sitting balance, without UE support pt requiring max assist for sitting balance due to increased lean to Rt.  Unable to follow any commands to engage in functional task while seated EOB     Vision   Vision Assessment?: Vision impaired- to be further tested in functional context Additional Comments: Pt able to track to midline, even locating therapist Rt of midline x1 with increased time and max cues.  Pt able to locate reflection in mirror x2 with increased time.          Cognition Arousal/Alertness: Lethargic;Awake/alert Behavior During Therapy: Flat affect Overall Cognitive Status: Impaired/Different from baseline Area of Impairment: Awareness;Attention;Following commands;Safety/judgement;Problem solving                   Current Attention Level: Focused   Following Commands: Follows one step commands inconsistently;Follows one step commands with increased time Safety/Judgement: Decreased awareness of safety;Decreased awareness of deficits   Problem Solving: Slow processing;Decreased initiation;Difficulty sequencing;Requires verbal cues;Requires tactile cues General Comments: Requiring increased time to follow cues and commands                   Pertinent Vitals/ Pain       Pain Assessment: Faces Faces Pain Scale: Hurts little more Pain Location: grimacing during bed mobility and while coughing Pain Intervention(s): Monitored during session;Repositioned         Frequency  Min 2X/week        Progress Toward Goals  OT  Goals(current goals can now be found in the care plan section)  Progress towards OT goals: Progressing toward goals  Acute Rehab OT Goals Patient Stated Goal: none stated by patient OT Goal Formulation: Patient unable to participate in goal setting Time For Goal Achievement: 08/18/19 Potential to Achieve Goals: Fair  Plan Discharge plan remains appropriate    Co-evaluation    PT/OT/SLP Co-Evaluation/Treatment: Yes Reason for Co-Treatment: Complexity of the patient's impairments (multi-system involvement);Necessary to address cognition/behavior during functional activity;For patient/therapist safety   OT goals addressed during session: ADL's and self-care      AM-PAC OT "6 Clicks" Daily Activity     Outcome Measure   Help from another person eating meals?: Total Help from another person taking care of personal grooming?: A Lot Help from another person toileting, which includes using toliet, bedpan, or urinal?: Total Help from another person bathing (including washing, rinsing, drying)?: Total Help from another person to put on and taking off regular upper body clothing?: Total Help from another person to put on and taking off regular lower body clothing?: Total 6 Click Score: 7  End of Session Equipment Utilized During Treatment: Other (comment)(trach collar)  OT Visit Diagnosis: Unsteadiness on feet (R26.81);Cognitive communication deficit (R41.841);Hemiplegia and hemiparesis;Muscle weakness (generalized) (M62.81);Apraxia (R48.2);Other symptoms and signs involving cognitive function;Pain Symptoms and signs involving cognitive functions: Cerebral infarction Hemiplegia - Right/Left: Right Hemiplegia - dominant/non-dominant: Dominant Hemiplegia - caused by: Cerebral infarction   Activity Tolerance Patient tolerated treatment well   Patient Left in bed;with call bell/phone within reach;with bed alarm set   Nurse Communication Mobility status;Other (comment)(condom cath off  and need for full bath)        Time: 2297-9892 OT Time Calculation (min): 31 min  Charges: OT General Charges $OT Visit: 1 Visit OT Treatments $Self Care/Home Management : 8-22 mins    Simonne Come [phone] 08/06/2019, 10:04 AM

## 2019-08-06 NOTE — Progress Notes (Signed)
STROKE TEAM PROGRESS NOTE   INTERVAL HISTORY  Wife and RN at bedside. Dr. Danella Maiers just saw pt and recommend speaking valve. He had procedure with Dr. Franky Macho yesterday for left scalp wound management. Had additional suture and no more CSF leakage. Pt neuro stable.   Vitals:   08/06/19 0125 08/06/19 0415 08/06/19 0500 08/06/19 0756  BP: 117/75 108/75  100/70  Pulse: 99 (!) 107  85  Resp: 20 14  16   Temp: 98.5 F (36.9 C) 99.1 F (37.3 C)  98.9 F (37.2 C)  TempSrc: Axillary Axillary  Axillary  SpO2: 94% 95%  95%  Weight:   95.6 kg   Height:       CBC:  Recent Labs  Lab 08/05/19 0616 08/06/19 0348  WBC 5.9 6.6  HGB 12.2* 11.9*  HCT 39.1 37.8*  MCV 97.3 94.5  PLT 253 260   Basic Metabolic Panel:  Recent Labs  Lab 08/05/19 0616 08/06/19 0348  NA 138 134*  K 4.2 4.2  CL 99 99  CO2 27 28  GLUCOSE 101* 120*  BUN 21* 17  CREATININE 0.75 0.66  CALCIUM 9.2 9.0    IMAGING past 24 hours No results found.   PHYSICAL EXAM  General- mildly obese middle-aged Caucasian male, s/p tracheostomy on trach collar  Ophthalmologic- fundi not visualized due to noncooperation.  Cardiovascular - Regular rate and rhythm.  Neuro - s/p tracheostomy on trach collar. Awake alert with eyes open. Seems to follow 1-2 central commands but not other commands. left gaze preference position, barely cross midline, notblinking to visual threat on the left, doll's eyespresent, PERRL. Corneal reflexweak on the right and brisk on the left, gag and coughpresent. Right facial droop. Tongue protrusion not cooperative. Spontaneous and purposeful movement left UE 4/5 and at least LE 3/5. However, right UE and LE plegic and flaccid. DTR 1+ and no babinski. Sensation, coordination and gait not tested.    ASSESSMENT/PLAN Lee Hooper is a 49 y.o. male with history of COPD, ongoing tobacco use, and psoriasis, presenting with dense LMCA syndrome. Attempt for Left ICA and Left M1 thrombectomy  was done emergently with TICI 2b flow. Unfortunately, the pt had post procedure hemorrhage and reocclusion of the vessels. F/u imaging suggested early edema and pt underwent prophylactic decompressive hemicraniectomy.  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 Stroke etiology unknown - given DVT requiring eventual AC and high mRS, will not do further embolic workup  Code Stroke CT head showed early hypodensity in nearly entire LMCA. ASPECTS 6-7 per notes.  CTA head & neck occlusion cervical ICA on the left. This is most likely due to dissection. The patient has minimal atherosclerotic disease.  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  There is poor collateral circulation on the left.   CTP CBF large core volume of 266 mL, perfusion volume 313 mL, mismatch volume 47 mL.  ASPECTS using CTP by Dr. 44 6 or 7.   IR - TICI2c revascularization  MRI  Restricted diffusion throughout the majority of the left MCA and ACA vascular territories consistent with acute ischemia.  MRA - reocclusion of the intracranial left internal carotid artery. No  flow related signal is seen within the left middle cerebral artery.  CT Head 07/04/19 - Slightly increased rightward midline shift with a small amount of herniation beneath the anterior falx. Otherwise unchanged examination.  CT repeat 5/6 stable infarcts and SAH but increasing edema w/ midline shift now 61mm and herniation through crani site  CT repeat 5/21 decreased shift 5->74mm. Extensive L crani w/ buldging through defect. Still L ACA and MCA low density w/ petechial hemorrhage but no large hemorrhage.   2D Echo - EF 55 - 60%. No  cardiac source of emboli identified.   LDL 45  HgbA1c 5.5  Subq heparin for VTE prophylaxis  No antithrombotic prior to admission, now on Eliquis.  Therapy recommendations:  SNF   Disposition:  Pending - LTACH consulted however no insurance   Respiratory failure s/p Trach Trach wound  Intubated on vent  CCM on board  CXR 07/03/19- New consolidation in the right lower lobe and left infrahilar region concerning for pneumonia. Possible small layering right effusion.  CXR 5/3 mild pulm edema w/ atx vs infiltrate/aspiration  On cefepime 4/30>>5/2 for pneumonia  Augmentin 5/2>>5/4  Unasyn 5/5>>5/6  Trach placed 5/8 per CCM  WOC consulted for trach wound ->progressed to stage 3 pressure, for dressing change q3d and prn  Fever and elevated white count and tachycardia hence restarted on Vanc & Cefepime 5/11>>5/13  Ancef 5/14>>5/17  (pneumonia)  On trach collar since 5/14 w/ high FiO2 requirements  Downsized to cuffless trach 6 by CCM 5/24  On trach collar  Cerebral edema, resolved Hypernatremia, resolved  MRI - Restricted diffusion throughout the majority of the left MCA and ACA vascular territories consistent with acute ischemia.  CT Head 07/04/19 - Slightly increased rightward midline shift with a small amount of herniation beneath the anterior falx. Otherwise unchanged examination.  S/p decompressive left hemicraniectomy  4/28 Dr Franky Macho  CT 5/6 - stable infarcts and SAH but increasing edema w/ midline shift now 34mm and herniation through crani site  CT head 5/20 - decreased shift 5->44mm. Extensive L crani w/ buldging through defect. Still L ACA and MCA low density w/ petechial hemorrhage but no large hemorrhage.  completed keppra 2 wk course per NSG  Off 3% and D5NS  Okay to allow Na gradually normalize  Na normalized   Sepsis, resolved Fever w/ leukocytosis, resolved HCAP, resolved, ? Recurrence Hypotension, resolved  TMax 100.2->100.3  Leukocytosis,  resolved  UA neg  Sputum moraxella, H. flu   On cefepime 4/30>>5/2; Augmentin 5/2>>5/4, Unasyn 5/5>>5/6 -> off  Blood culture 5/2 no growth  Blood cultures 5/7 - no growth   Resp cultures 5/7 - no growth  CXR 5/9 - streaky BLL opacilites atx/consolication/PNA. Possible R pleural effusion  CXR 5/10 w/ atx vs infiltrate  CXR 07/15/19 - Stable bibasilar atelectasis a small right pleural effusion. No new opacity evident. Stable cardiac silhouette  Sputum cx 5/11 reincubated   Blood Cx 5/11 no growth   Vanc 5/11>>5/13, Cefepime 5/11>>5/13  Ancef 5/14>>5/17  (pneumonia)  CXR 5/18 slight improvement in bibasilar atx  CXR 5/24 increased vol loss RLL w/ streaky opacities, worsening atx, ? PNA. Minor LLL atx.  Vanc 5/24>>off  Cefepime 5/24>>5/30  UA WNL  CXR 07/27/19 - Increased volume loss at the right lung base with streaky opacities, favor worsening atelectasis. Pneumonia is also considered in the setting of elevated temperature.  Bilateral Lower Ext DVT, extensive Possible PE given tachycardia  Venous US 07/11/2019 DVT + bilateral extensive  IVC filter placed  5/9  On eliquis now for DVT prophylaxis  Hypertension  Home meds:  None  Current meds: none scheduled  SBP goal < 160 due to hemorrhagic conversion . Long-term BP goal normotensive  Dysphagia   NPO  Coretrak 5/7  PEG placement 5/14  FW 150 Q4h   On bolus TF  Ileus, resolved  Abd distension w/ decreased BS  KUB 5/2 distended colonic loop  Miralax   Flexiseal following laxatives  On reglan, laxatives on hold, decrease fentanyl  Coretrak 5/7  PEG 5/14  TF resumed after PEG  CSF leakage  Crani sutures out -> suture incision leaking 5/16 - s/p local suture  Again From left scalp incision site 6/1  NSG on board ([facility])  S/p wound repair with Dr. Christella Noa 6/2  Acute Renal Failure, resolved  Renal US - increased cortical echogenicity in B kidneys c/w renal dz. Increased  echogenicity of liver ?hepatic steatosis.  Urine studies - elevated protein creatinine ratio, Na normal, UN normal  Creatinine - 0.66   Diuresis - per CCM in ICU  Tobacco abuse  Current smoker  Smoking cessation counseling will be provided if able  Other Stroke Risk Factors  Obesity, Body mass index is 28.58 kg/m., recommend weight loss, diet and exercise as appropriate    Likely undiagnosed obstructive sleep apnea based on body habitus  Other Active Problems  Hypokalemia - resolved   Thrombocytopenia - resolved  Elevated LFT - AST/ALT 54/102, improved    Constipation. Miralax daily -> loose stool -> miralax discontinued -> add imodium   Charm trial for cytotoxic edema  Mild anemia - Hb - 11.8->12.2->11.9   Mild hyponatremia - Na 134  Hospital day # 26  I had long discussion with wife at bedside, updated pt current condition, treatment plan and potential prognosis, and answered all the questions. She expressed understanding and appreciation.    Rosalin Hawking, MD PhD Stroke Neurology 08/06/2019 1:28 PM     To contact Stroke Continuity provider, please refer to [email or web address] After hours, contact General Neurology

## 2019-08-06 NOTE — Progress Notes (Signed)
Had am episode where he was coughing after receiving his feedings and wife thought the feeding were coming out of his trach because secretions were tan in color.  This nurse and Educator entered room and educator suctioned him and this nurse assessed his lungs.  He was clear to diminished, just as he had been and I did tell her that this is the color of the secretions that he had earlier in the day and they were slightly blood tinged as well.  I did notify Dr Roda Shutters and Dr Lindie Spruce, who was at the bedside, that he did have some bloody secretions and they said it was probably from the intubation from his surgical procedure the prior day and he was ok, as it was just blood tinged and he did not have lots of blood.  He was not in distress so he was ok.  Wife was at his side and notified as well and both doctors came in to speak with her about it.

## 2019-08-06 NOTE — Progress Notes (Signed)
Physical Therapy Treatment Patient Details Name: Lee ARO MRN: [id number] DOB: 07-06-1970 Today's Date: 08/06/2019    History of Present Illness This 49 y.o. male admitted 4/28 with Rt sided hemiplegia and aphasia.  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  Repeat CT following procedure showed moderate basal ganglia hemorrhage vs. stroke   Repeat MRI revealed that the ICA and MCA re-occluded with change in FLAIR sequence excluding him from repeat thrombectomy.  He underwent a decompressive hemicraniectomy.  He waws intubated 4/28.  He developed an ileus 5/2. Trach 5/8. IVC filter placed 5/9.  Peg placement.  PMH includes: COPD, tobacco abuse, psoriasis    PT Comments    Patient seen for mobility progression. This session focused on sitting balance EOB and functional transfer training. Pt able to partial stand X 2 trials and overall requires max-total A +2 due to continued R side hemiplegia and tendency to push with L UE. Continue to progress as tolerated with anticipated d/c to SNF for further skilled PT services.     Follow Up Recommendations  SNF     Equipment Recommendations  Wheelchair (measurements PT);Wheelchair cushion (measurements PT);Hospital bed    Recommendations for Other Services       Precautions / Restrictions Precautions Precautions: Fall Precaution Comments: R hemi, trach, PEG, flexiseal; No Bone flap L head;crani Restrictions Weight Bearing Restrictions: No    Mobility  Bed Mobility Overal bed mobility: Needs Assistance Bed Mobility: Sit to Supine;Supine to Sit Rolling: +2 for physical assistance;Max assist Sidelying to sit: Max assist;+2 for physical assistance     Sit to sidelying: Max assist;+2 for physical assistance General bed mobility comments: multimodal cues for sequening and hand over hand assist to reach with L UE to R side; assist with bed pad to bring hips/bilat LE to EOB and then to elevate trunk into sitting; R shoulder  supported while returning to side lying and bilat LE assisted into bed with pt attempting to elevate L LE   Transfers Overall transfer level: Needs assistance Equipment used: 2 person hand held assist Transfers: Sit to/from Stand Sit to Stand: Max assist;+2 physical assistance         General transfer comment: R UE supported and R knee blocked by therapist given R hemiplegia; engaged in sit > stand from EOB with Max A +2, pt able to engage in partial sit > stand with facilitation from therapist for anterior weight shift.  Pt unable to come to full upright standing due to fatigue and increased pushing in standing.  Ambulation/Gait                 Stairs             Wheelchair Mobility    Modified Rankin (Stroke Patients Only) Modified Rankin (Stroke Patients Only) Pre-Morbid Rankin Score: No symptoms Modified Rankin: Severe disability     Balance Overall balance assessment: Needs assistance Sitting-balance support: Feet supported;Single extremity supported Sitting balance-Leahy Scale: Fair Sitting balance - Comments: pt was able to range from max of 1 to min guard in sitting EOB when holding onto foot of bed with L UE  Postural control: Right lateral lean Standing balance support: During functional activity Standing balance-Leahy Scale: Zero                              Cognition Arousal/Alertness: Lethargic;Awake/alert Behavior During Therapy: Flat affect Overall Cognitive Status: Impaired/Different from baseline Area  of Impairment: Attention;Following commands;Safety/judgement;Problem solving                   Current Attention Level: Focused   Following Commands: Follows one step commands inconsistently;Follows one step commands with increased time Safety/Judgement: Decreased awareness of safety;Decreased awareness of deficits   Problem Solving: Slow processing;Decreased initiation;Difficulty sequencing;Requires verbal cues;Requires  tactile cues General Comments: Requiring increased time to follow cues and commands      Exercises      General Comments        Pertinent Vitals/Pain Pain Assessment: Faces Faces Pain Scale: Hurts little more Pain Location: grimacing during bed mobility and while coughing Pain Descriptors / Indicators: Grimacing Pain Intervention(s): Monitored during session;Repositioned    Home Living                      Prior Function            PT Goals (current goals can now be found in the care plan section) Acute Rehab PT Goals Patient Stated Goal: none stated by patient Progress towards PT goals: Progressing toward goals    Frequency    Min 2X/week      PT Plan Current plan remains appropriate    Co-evaluation PT/OT/SLP Co-Evaluation/Treatment: Yes Reason for Co-Treatment: Complexity of the patient's impairments (multi-system involvement);For patient/therapist safety;To address functional/ADL transfers;Necessary to address cognition/behavior during functional activity PT goals addressed during session: Balance;Mobility/safety with mobility OT goals addressed during session: ADL's and self-care      AM-PAC PT "6 Clicks" Mobility   Outcome Measure  Help needed turning from your back to your side while in a flat bed without using bedrails?: Total Help needed moving from lying on your back to sitting on the side of a flat bed without using bedrails?: Total Help needed moving to and from a bed to a chair (including a wheelchair)?: Total Help needed standing up from a chair using your arms (e.g., wheelchair or bedside chair)?: Total Help needed to walk in hospital room?: Total Help needed climbing 3-5 steps with a railing? : Total 6 Click Score: 6    End of Session Equipment Utilized During Treatment: Oxygen Activity Tolerance: Patient tolerated treatment well Patient left: in bed;with call bell/phone within reach;with bed alarm set Nurse Communication:  Mobility status;Need for lift equipment PT Visit Diagnosis: Other abnormalities of gait and mobility (R26.89);Hemiplegia and hemiparesis Hemiplegia - Right/Left: Right Hemiplegia - dominant/non-dominant: Dominant Hemiplegia - caused by: Nontraumatic intracerebral hemorrhage     Time: 0827-0858 PT Time Calculation (min) (ACUTE ONLY): 31 min  Charges:  $Therapeutic Activity: 8-22 mins                     Earney Navy, PTA Acute Rehabilitation Services Pager: [phone] Office: [phone]     Darliss Cheney 08/06/2019, 11:59 AM

## 2019-08-07 LAB — GLUCOSE, CAPILLARY
Glucose-Capillary: 102 mg/dL — ABNORMAL HIGH (ref 70–99)
Glucose-Capillary: 140 mg/dL — ABNORMAL HIGH (ref 70–99)
Glucose-Capillary: 175 mg/dL — ABNORMAL HIGH (ref 70–99)
Glucose-Capillary: 181 mg/dL — ABNORMAL HIGH (ref 70–99)
Glucose-Capillary: 76 mg/dL (ref 70–99)
Glucose-Capillary: 98 mg/dL (ref 70–99)
Glucose-Capillary: 99 mg/dL (ref 70–99)

## 2019-08-07 LAB — CBC
HCT: 39.8 % (ref 39.0–52.0)
Hemoglobin: 12.3 g/dL — ABNORMAL LOW (ref 13.0–17.0)
MCH: 29.7 pg (ref 26.0–34.0)
MCHC: 30.9 g/dL (ref 30.0–36.0)
MCV: 96.1 fL (ref 80.0–100.0)
Platelets: 263 10*3/uL (ref 150–400)
RBC: 4.14 MIL/uL — ABNORMAL LOW (ref 4.22–5.81)
RDW: 18 % — ABNORMAL HIGH (ref 11.5–15.5)
WBC: 5 10*3/uL (ref 4.0–10.5)
nRBC: 0 % (ref 0.0–0.2)

## 2019-08-07 LAB — BASIC METABOLIC PANEL
Anion gap: 8 (ref 5–15)
BUN: 17 mg/dL (ref 6–20)
CO2: 31 mmol/L (ref 22–32)
Calcium: 8.8 mg/dL — ABNORMAL LOW (ref 8.9–10.3)
Chloride: 98 mmol/L (ref 98–111)
Creatinine, Ser: 0.68 mg/dL (ref 0.61–1.24)
GFR calc Af Amer: >60 mL/min (ref >60)
GFR calc non Af Amer: >60 mL/min (ref >60)
Glucose, Bld: 95 mg/dL (ref 70–99)
Potassium: 4 mmol/L (ref 3.5–5.1)
Sodium: 137 mmol/L (ref 135–145)

## 2019-08-07 NOTE — Progress Notes (Signed)
Patient ID: Lee Hooper, male   DOB: 30-Oct-1970, 49 y.o.   MRN: [id number] BP 116/77 (BP Location: Left Leg)    Pulse 94    Temp 97.9 F (36.6 C) (Axillary)    Resp 19    Ht 6' (1.829 m)    Wt 96.9 kg    SpO2 100%    BMI 28.97 kg/m  Eyes open , not following commaNDS Purposeful movements with upper extremity left  Wound is dry, no signs of infection

## 2019-08-07 NOTE — Progress Notes (Addendum)
STROKE TEAM PROGRESS NOTE   INTERVAL HISTORY  Patient is resting comfortably, awake eyes open. Patient L crani site stable, no longer having CSF leak, trach in place. Not following commands. Speech is working with him on speaking valve.   Vitals:   08/07/19 0356 08/07/19 0440 08/07/19 0747 08/07/19 1147  BP: 106/80   110/64  Pulse: (!) 103   99  Resp: 20  20 19   Temp: 99.2 F (37.3 C)  98.9 F (37.2 C) 98.6 F (37 C)  TempSrc: Oral  Axillary Axillary  SpO2: 95%   94%  Weight:  96.9 kg    Height:       CBC:  Recent Labs  Lab 08/06/19 0348 08/07/19 0516  WBC 6.6 5.0  HGB 11.9* 12.3*  HCT 37.8* 39.8  MCV 94.5 96.1  PLT 260 263   Basic Metabolic Panel:  Recent Labs  Lab 08/06/19 0348 08/07/19 0516  NA 134* 137  K 4.2 4.0  CL 99 98  CO2 28 31  GLUCOSE 120* 95  BUN 17 17  CREATININE 0.66 0.68  CALCIUM 9.0 8.8*    IMAGING past 24 hours No results found.   PHYSICAL EXAM  General- mildly obese middle-aged Caucasian male, s/p tracheostomy on trach collar  Ophthalmologic- fundi not visualized due to noncooperation.  Cardiovascular - Regular rate and rhythm.  Neuro - s/p tracheostomy on trach collar. Asleep, doesn't awake to loud voice. Unable to assess today d/t somnolence, withdraws/purposeful on LHB, RHB plegic/flaccid . left gaze preference position, barely cross midline, notblinking to visual threat on the left, doll's eyespresent, PERRL. Corneal reflexweak on the right and brisk on the left, gag and coughpresent. Right facial droop. Tongue protrusion not cooperative. DTR 1+ and no babinski. Sensation, coordination and gait not tested.    ASSESSMENT/PLAN Lee Hooper is a 49 y.o. male with history of COPD, ongoing tobacco use, and psoriasis, presenting with dense LMCA syndrome. Attempt for Left ICA and Left M1 thrombectomy was done emergently with TICI 2b flow. Unfortunately, the pt had post procedure hemorrhage and reocclusion of the vessels.  F/u imaging suggested early edema and pt underwent prophylactic decompressive hemicraniectomy.  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 Stroke etiology unknown - given DVT requiring eventual AC and high mRS, will not do further embolic workup  Code Stroke CT head showed early hypodensity in nearly entire LMCA. ASPECTS 6-7 per notes.  CTA head & neck occlusion cervical ICA on the left. This is most likely due to dissection. The patient has minimal atherosclerotic disease.  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  There is poor collateral circulation on the left.   CTP CBF large core volume of 266 mL, perfusion volume 313 mL, mismatch volume 47 mL.  ASPECTS using CTP by Dr. 44 6 or 7.   IR - TICI2c revascularization  MRI  Restricted diffusion throughout the majority of the left MCA and ACA vascular territories consistent with acute ischemia.  MRA - reocclusion of the intracranial left internal carotid artery. No flow related signal is seen within the left middle cerebral artery.  CT Head 07/04/19 - Slightly increased rightward midline shift with a  small amount of herniation beneath the anterior falx. Otherwise unchanged examination.  CT repeat 5/6 stable infarcts and SAH but increasing edema w/ midline shift now 20mm and herniation through crani site  CT repeat 5/21 decreased shift 5->62mm. Extensive L crani w/ buldging through defect. Still L ACA and MCA low density w/ petechial hemorrhage but no large hemorrhage.   2D Echo - EF 55 - 60%. No cardiac source of emboli identified.   LDL 45  HgbA1c 5.5  Subq heparin for VTE prophylaxis  No antithrombotic prior  to admission, now on Eliquis.  Therapy recommendations:  SNF   Disposition:  Pending - LTACH consulted however no insurance   Respiratory failure s/p Trach Trach wound  Intubated on vent  CCM on board  CXR 07/03/19- New consolidation in the right lower lobe and left infrahilar region concerning for pneumonia. Possible small layering right effusion.  CXR 5/3 mild pulm edema w/ atx vs infiltrate/aspiration  On cefepime 4/30>>5/2 for pneumonia  Augmentin 5/2>>5/4  Unasyn 5/5>>5/6  Trach placed 5/8 per CCM  WOC consulted for trach wound ->progressed to stage 3 pressure, for dressing change q3d and prn  Fever and elevated white count and tachycardia hence restarted on Vanc & Cefepime 5/11>>5/13  Ancef 5/14>>5/17  (pneumonia)  On trach collar since 5/14 w/ high FiO2 requirements  Downsized to cuffless trach 6 by CCM 5/24  On trach collar  Cerebral edema, resolved Hypernatremia, resolved  MRI - Restricted diffusion throughout the majority of the left MCA and ACA vascular territories consistent with acute ischemia.  CT Head 07/04/19 - Slightly increased rightward midline shift with a small amount of herniation beneath the anterior falx. Otherwise unchanged examination.  S/p decompressive left hemicraniectomy  4/28 Dr Franky Macho  CT 5/6 - stable infarcts and SAH but increasing edema w/ midline shift now 70mm and herniation through crani site  CT head 5/20 - decreased shift 5->54mm. Extensive L crani w/ buldging through defect. Still L ACA and MCA low density w/ petechial hemorrhage but no large hemorrhage.  completed keppra 2 wk course per NSG  Off 3% and D5NS  Okay to allow Na gradually normalize  Na normalized   Sepsis, resolved Fever w/ leukocytosis, resolved HCAP, resolved, ? Recurrence Hypotension, resolved  TMax 100.2->100.3->afebrile  Leukocytosis, resolved  UA neg  Sputum moraxella, H. flu   On cefepime 4/30>>5/2; Augmentin 5/2>>5/4, Unasyn 5/5>>5/6 ->  off  Blood culture 5/2 no growth  Blood cultures 5/7 - no growth   Resp cultures 5/7 - no growth  CXR 5/9 - streaky BLL opacilites atx/consolication/PNA. Possible R pleural effusion  CXR 5/10 w/ atx vs infiltrate  CXR 07/15/19 - Stable bibasilar atelectasis a small right pleural effusion. No new opacity evident. Stable cardiac silhouette  Sputum cx 5/11 reincubated   Blood Cx 5/11 no growth   Vanc 5/11>>5/13, Cefepime 5/11>>5/13  Ancef 5/14>>5/17  (pneumonia)  CXR 5/18 slight improvement in bibasilar atx  CXR 5/24 increased vol loss RLL w/ streaky opacities, worsening atx, ? PNA. Minor LLL atx.  Vanc 5/24>>off  Cefepime 5/24>>5/30  UA WNL  CXR 07/27/19 - Increased volume loss at the right lung base with streaky opacities, favor worsening atelectasis. Pneumonia is also considered in the setting of elevated temperature.  Bilateral Lower Ext DVT, extensive Possible PE given tachycardia  Venous US 07/11/2019 DVT + bilateral extensive  IVC filter placed 5/9  On eliquis now for DVT prophylaxis  Hypertension  Home meds:  None  Current meds: none scheduled  SBP goal <  160 due to hemorrhagic conversion . Long-term BP goal normotensive  Dysphagia   NPO  Coretrak 5/7  PEG placement 5/14  FW 150 Q4h   On bolus TF  Ileus, resolved  Abd distension w/ decreased BS  KUB 5/2 distended colonic loop  Miralax   Flexiseal following laxatives  On reglan, laxatives on hold, decrease fentanyl  Coretrak 5/7  PEG 5/14  TF resumed after PEG  CSF leakage  Crani sutures out -> suture incision leaking 5/16 - s/p local suture  Again From left scalp incision site 6/1  NSG on board ([facility])  S/p wound repair with Dr. Christella Noa 6/2  Acute Renal Failure, resolved  Renal US - increased cortical echogenicity in B kidneys c/w renal dz. Increased echogenicity of liver ?hepatic steatosis.  Urine studies - elevated protein creatinine ratio, Na normal, UN  normal  Creatinine - 0.68   Diuresis - per CCM in ICU  Tobacco abuse  Current smoker  Smoking cessation counseling will be provided if able  Other Stroke Risk Factors  Obesity, Body mass index is 28.97 kg/m., recommend weight loss, diet and exercise as appropriate    Likely undiagnosed obstructive sleep apnea based on body habitus  Other Active Problems  Hypokalemia - resolved   Thrombocytopenia - resolved  Elevated LFT - AST/ALT 54/102, improved    Constipation. Miralax daily -> loose stool -> miralax discontinued -> add imodium   Charm trial for cytotoxic edema  Mild anemia - Hb - 11.8->12.2->11.9->12.3  Mild hyponatremia - [facility] day # 37  Rosalin Hawking, MD PhD Stroke Neurology 08/07/2019 9:05 PM  To contact Stroke Continuity provider, please refer to [email or web address] After hours, contact General Neurology

## 2019-08-07 NOTE — Op Note (Signed)
08/05/2019  9:05 PM  PATIENT:  Lee Hooper  49 y.o. male  PRE-OPERATIVE DIAGNOSIS:  Wound Dehiscence  POST-OPERATIVE DIAGNOSIS: scalp abrasion  PROCEDURE:  Procedure(s): Scalp Wound Revision  SURGEON: Surgeon(s): Coletta Memos, MD  ASSISTANTS:none  ANESTHESIA:   general  EBL:  No intake/output data recorded.  BLOOD ADMINISTERED:none  CELL SAVER GIVEN:none  COUNT:per nursing  DRAINS: none   SPECIMEN:  Source of Specimen:  scalp abrasion  DICTATION: Lee Hooper was taken to the operating room, and placed under general anesthesia without difficulty. He was positioned supine with his head on a horseshoe. His head was shaved, prepped, and draped in a sterile manner. I removed the sutures that were at the previous leak site, which was along the incision. That had healed well, and no fluid was emanating from that site. Just above there was a plug overlying abraded scalp. I removed the plug, and the leak was identified. There was no dehiscence along the scar. I placed two vertical mattress sutures, and stopped the leak. I covered the sutures with antibiotic ointment.   PLAN OF CARE: Admit to inpatient   PATIENT DISPOSITION:  PACU - hemodynamically stable.   Delay start of Pharmacological VTE agent (>24hrs) due to surgical blood loss or risk of bleeding:  yes

## 2019-08-08 LAB — GLUCOSE, CAPILLARY
Glucose-Capillary: 88 mg/dL (ref 70–99)
Glucose-Capillary: 97 mg/dL (ref 70–99)
Glucose-Capillary: 197 mg/dL — ABNORMAL HIGH (ref 70–99)
Glucose-Capillary: 87 mg/dL (ref 70–99)
Glucose-Capillary: 140 mg/dL — ABNORMAL HIGH (ref 70–99)
Glucose-Capillary: 199 mg/dL — ABNORMAL HIGH (ref 70–99)

## 2019-08-08 NOTE — Progress Notes (Signed)
NEUROSURGERY PROGRESS NOTE  Doing ok, on trach collar. Incision cdi with sutures  Temp:  [97.9 F (36.6 C)-98.7 F (37.1 C)] 98.5 F (36.9 C) (06/05 0338) Pulse Rate:  [89-108] 93 (06/05 0338) Resp:  [17-24] 24 (06/05 0338) BP: (102-121)/(64-78) 111/78 (06/05 0338) SpO2:  [94 %-100 %] 97 % (06/05 0338) FiO2 (%):  [28 %] 28 % (06/05 0815) Weight:  [96.9 kg] 96.9 kg (06/05 0338)   Sherryl Manges, NP 08/08/2019 9:41 AM

## 2019-08-08 NOTE — Evaluation (Signed)
[facility] Speaking Valve - Evaluation Patient Details  Name: Lee Hooper MRN: [id number] Date of Birth: December 20, 1970  Today's Date: 08/08/2019 Time: 1100-1128 SLP Time Calculation (min) (ACUTE ONLY): 28 min  Past Medical History: History reviewed. No pertinent past medical history. Past Surgical History:  Past Surgical History:  Procedure Laterality Date  . CRANIOTOMY Left 07/01/2019   Procedure: Left Hemicraniectomy;  Surgeon: Coletta Memos, MD;  Location: [facility] OR;  Service: Neurosurgery;  Laterality: Left;  . ESOPHAGOGASTRODUODENOSCOPY N/A 07/17/2019   Procedure: ESOPHAGOGASTRODUODENOSCOPY (EGD);  Surgeon: Diamantina Monks, MD;  Location: [facility] ENDOSCOPY;  Service: General;  Laterality: N/A;  . IR CT HEAD LTD  07/01/2019  . IR IVC FILTER PLMT / S&I /IMG GUID/MOD SED  07/12/2019  . IR PERCUTANEOUS ART THROMBECTOMY/INFUSION INTRACRANIAL INC DIAG ANGIO  07/01/2019  . LESION EXCISION N/A 08/05/2019   Procedure: Scalp Wound Revision;  Surgeon: Coletta Memos, MD;  Location: [facility] OR;  Service: Neurosurgery;  Laterality: N/A;  left,posterior  . PEG PLACEMENT N/A 07/17/2019   Procedure: PERCUTANEOUS ENDOSCOPIC GASTROSTOMY (PEG) PLACEMENT;  Surgeon: Diamantina Monks, MD;  Location: [facility] ENDOSCOPY;  Service: General;  Laterality: N/A;  . RADIOLOGY WITH ANESTHESIA N/A 07/01/2019   Procedure: IR WITH ANESTHESIA;  Surgeon: Radiologist, Medication, MD;  Location: [facility] OR;  Service: Radiology;  Laterality: N/A;   HPI:  This 49 y.o. male admitted 4/28 with Rt sided hemiplegia and aphasia.  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  Repeat CT following procedure showed moderate basal ganglia hemorrhage vs. stroke   Repeat MRI revealed that the ICA and MCA re-occluded with change in FLAIR sequence excluding him from repeat thrombectomy.  He underwent a decompressive hemicraniectomy.  He was intubated 4/28.  He developed an ileus 5/2. Trach 5/8. IVC filter placed 5/9.  Peg placement.  PMH includes: COPD,  tobacco abuse, psoriasis   Assessment / Plan / Recommendation Clinical Impression  Pt was seen for PMV trials in the setting of aphonia secondary to trach.  Pt has a Shiley #6 uncuffed trach and he was noted to have tachycardia upon arrival.  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  PMV was placed and pt tolerated without difficulty.   Initial vitals were the following: HR - 115, SpO2 - 95, RR - 22.   PMV was removed after 30 seconds and no back pressure was observed.  PMV was replaced.  SpO2 and RR remained fairly stable throughout this evaluation; however, HR ranged from 113-123.  RN reported that pt had been tachycardic prior to this evaluation, but that his HR was in the 80s and 90s earlier this morning.  Pt was tachycardic with and without PMV, and PMV placement did not appear to significantly impact HR overall.  Pt was unable to phonate volitionally despite max verbal and auditory cues, but involuntary phonation was observed x1.  Vocal quality appeared clear to limited evaluation.  Pt tolerated PMV for up to 5 minutes at a time and overall for approximately 20 minutes.  Recommend that pt wear the PMV mostly with SLP at this time, but he may wear it intermittently with trained staff given full supervision.  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  MD please order if you agree.  SLP will f/u per POC.    SLP Visit Diagnosis: Aphonia (R49.1)  SLP Assessment  Patient needs continued Speech Lanaguage Pathology Services    Follow Up Recommendations  Inpatient Rehab    Frequency and Duration min 2x/week  2 weeks    PMSV Trial PMSV was placed for: 20 minutes total  Able to redirect subglottic air through upper airway: Yes Able to Attain Phonation: Yes Voice Quality:  Low vocal intensity Able to Expectorate Secretions: No attempts Level of Secretion Expectoration with PMSV: Not observed Breath Support for Phonation: Mildly decreased Intelligibility: Unable to assess (comment) Behavior: Alert;No attempt to communicate;Poor eye contact   Tracheostomy Tube       Vent Dependency  FiO2 (%): 28 %    Cuff Deflation Trial  Colin Mulders M.S., CCC-SLP Acute Rehabilitation Services Office: [phone]           Elvia [facility] 08/08/2019, 11:54 AM

## 2019-08-08 NOTE — Progress Notes (Signed)
STROKE TEAM PROGRESS NOTE   INTERVAL HISTORY  No family at bedside. Pt initially sleepy but easily arousable, left thumb up to me. No acute event overnight. Left scalp would cdi.    Vitals:   08/08/19 0338 08/08/19 1147 08/08/19 1351 08/08/19 1400  BP: 111/78 110/75  119/86  Pulse: 93 (!) 112 98 100  Resp: (!) 24 (!) 24 (!) 24 (!) 25  Temp: 98.5 F (36.9 C) 98.4 F (36.9 C)  99.2 F (37.3 C)  TempSrc: Axillary Oral  Oral  SpO2: 97% 94% 96% 95%  Weight: 96.9 kg     Height:       CBC:  Recent Labs  Lab 08/06/19 0348 08/07/19 0516  WBC 6.6 5.0  HGB 11.9* 12.3*  HCT 37.8* 39.8  MCV 94.5 96.1  PLT 260 263   Basic Metabolic Panel:  Recent Labs  Lab 08/06/19 0348 08/07/19 0516  NA 134* 137  K 4.2 4.0  CL 99 98  CO2 28 31  GLUCOSE 120* 95  BUN 17 17  CREATININE 0.66 0.68  CALCIUM 9.0 8.8*    IMAGING past 24 hours No results found.   PHYSICAL EXAM  General- mildly obese middle-aged Caucasian male, s/p tracheostomy on trach collar  Ophthalmologic- fundi not visualized due to noncooperation.  Cardiovascular - Regular rate and rhythm.  Neuro - s/p tracheostomy on trach collar. Initially sleepy but easily arousable, global aphasia, left gaze preference position, barely cross midline, notblinking to visual threat on the left, PERRL. Right facial droop. Tongue protrusion not cooperative. LUE spontaneous movement at least 4/5, LLE against gravity at least 3/5, right UE and LE flaccid, slight 1/5 movement with pain at RLE. DTR 1+ and no babinski. Sensation, coordination and gait not tested.    ASSESSMENT/PLAN Lee Hooper is a 49 y.o. male with history of COPD, ongoing tobacco use, and psoriasis, presenting with dense LMCA syndrome. Attempt for Left ICA and Left M1 thrombectomy was done emergently with TICI 2b flow. Unfortunately, the pt had post procedure hemorrhage and reocclusion of the vessels. F/u imaging suggested early edema and pt underwent  prophylactic decompressive hemicraniectomy.  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 Stroke etiology unknown - given DVT requiring eventual AC and high mRS, will not do further embolic workup  Code Stroke CT head showed early hypodensity in nearly entire LMCA. ASPECTS 6-7 per notes.  CTA head & neck occlusion cervical ICA on the left. This is most likely due to dissection. The patient has minimal atherosclerotic disease.  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  There is poor collateral circulation on the left.   CTP CBF large core volume of 266 mL, perfusion volume 313 mL, mismatch volume 47 mL.  ASPECTS using CTP by Dr. Chestine Spore 6 or 7.   IR - TICI2c revascularization  MRI  Restricted diffusion throughout the majority of the left MCA and ACA vascular territories consistent with acute ischemia.  MRA - reocclusion of the intracranial left internal carotid artery. No flow related signal is seen within the left middle cerebral artery.  CT Head 07/04/19 - Slightly increased rightward midline shift with a small amount of herniation beneath  the anterior falx. Otherwise unchanged examination.  CT repeat 5/6 stable infarcts and SAH but increasing edema w/ midline shift now 91mm and herniation through crani site  CT repeat 5/21 decreased shift 5->70mm. Extensive L crani w/ buldging through defect. Still L ACA and MCA low density w/ petechial hemorrhage but no large hemorrhage.   2D Echo - EF 55 - 60%. No cardiac source of emboli identified.   LDL 45  HgbA1c 5.5  Subq heparin for VTE prophylaxis  No antithrombotic prior to admission, now on Eliquis.  Therapy  recommendations:  SNF   Disposition:  Pending - LTACH consulted however no insurance   Respiratory failure s/p Trach Trach wound  Intubated on vent  CCM on board  CXR 07/03/19- New consolidation in the right lower lobe and left infrahilar region concerning for pneumonia. Possible small layering right effusion.  CXR 5/3 mild pulm edema w/ atx vs infiltrate/aspiration  On cefepime 4/30>>5/2 for pneumonia  Augmentin 5/2>>5/4  Unasyn 5/5>>5/6  Trach placed 5/8 per CCM  WOC consulted for trach wound ->progressed to stage 3 pressure, for dressing change q3d and prn  Fever and elevated white count and tachycardia hence restarted on Vanc & Cefepime 5/11>>5/13  Ancef 5/14>>5/17  (pneumonia)  On trach collar since 5/14 w/ high FiO2 requirements  Downsized to cuffless trach 6 by CCM 5/24  On trach collar  Cerebral edema, resolved Hypernatremia, resolved  MRI - Restricted diffusion throughout the majority of the left MCA and ACA vascular territories consistent with acute ischemia.  CT Head 07/04/19 - Slightly increased rightward midline shift with a small amount of herniation beneath the anterior falx. Otherwise unchanged examination.  S/p decompressive left hemicraniectomy  4/28 Dr Franky Macho  CT 5/6 - stable infarcts and SAH but increasing edema w/ midline shift now 63mm and herniation through crani site  CT head 5/20 - decreased shift 5->87mm. Extensive L crani w/ buldging through defect. Still L ACA and MCA low density w/ petechial hemorrhage but no large hemorrhage.  completed keppra 2 wk course per NSG  Off 3% and D5NS  Okay to allow Na gradually normalize  Na normalized   Sepsis, resolved Fever w/ leukocytosis, resolved HCAP, resolved, ? Recurrence Hypotension, resolved  TMax 100.2->100.3->afebrile  Leukocytosis, resolved   UA neg  Sputum moraxella, H. flu   On cefepime 4/30>>5/2; Augmentin 5/2>>5/4, Unasyn 5/5>>5/6 -> off  Blood culture 5/2 no  growth  Blood cultures 5/7 - no growth   Resp cultures 5/7 - no growth  CXR 5/9 - streaky BLL opacilites atx/consolication/PNA. Possible R pleural effusion  CXR 5/10 w/ atx vs infiltrate  CXR 07/15/19 - Stable bibasilar atelectasis a small right pleural effusion. No new opacity evident. Stable cardiac silhouette  Sputum cx 5/11 reincubated   Blood Cx 5/11 no growth   Vanc 5/11>>5/13, Cefepime 5/11>>5/13  Ancef 5/14>>5/17  (pneumonia)  CXR 5/18 slight improvement in bibasilar atx  CXR 5/24 increased vol loss RLL w/ streaky opacities, worsening atx, ? PNA. Minor LLL atx.  Vanc 5/24>>off  Cefepime 5/24>>5/30  UA WNL  CXR 07/27/19 - Increased volume loss at the right lung base with streaky opacities, favor worsening atelectasis. Pneumonia is also considered in the setting of elevated temperature.  Bilateral Lower Ext DVT, extensive Possible PE given tachycardia  Venous US 07/11/2019 DVT + bilateral extensive  IVC filter placed 5/9  On eliquis now for DVT prophylaxis  Hypertension  Home meds:  None  Current meds: none scheduled  SBP goal < 160 due to  hemorrhagic conversion . Long-term BP goal normotensive  Dysphagia   NPO  Coretrak 5/7  PEG placement 5/14  FW 150 Q4h   On bolus TF  Ileus, resolved  Abd distension w/ decreased BS  KUB 5/2 distended colonic loop  Miralax   Flexiseal following laxatives  On reglan, laxatives on hold, decrease fentanyl  Coretrak 5/7  PEG 5/14  TF resumed after PEG  CSF leakage  Crani sutures out -> suture incision leaking 5/16 - s/p local suture  Again From left scalp incision site 6/1  NSG on board ([facility])  S/p wound repair with Dr. Christella Noa 6/2  Acute Renal Failure, resolved  Renal US - increased cortical echogenicity in B kidneys c/w renal dz. Increased echogenicity of liver ?hepatic steatosis.  Urine studies - elevated protein creatinine ratio, Na normal, UN normal  Creatinine - 0.68   Diuresis  - per CCM in ICU  Tobacco abuse  Current smoker  Smoking cessation counseling will be provided if able  Other Stroke Risk Factors  Obesity, Body mass index is 28.97 kg/m., recommend weight loss, diet and exercise as appropriate    Likely undiagnosed obstructive sleep apnea based on body habitus  Other Active Problems  Hypokalemia - resolved - 4.0  Thrombocytopenia - resolved  Elevated LFT - AST/ALT 54/102, improved    Constipation. Miralax daily -> loose stool -> miralax discontinued -> add imodium   Charm trial for cytotoxic edema  Mild anemia - Hb - 11.8->12.2->11.9->12.3  Mild hyponatremia - Na 134->137  [facility]ospital day # 68  Rosalin Hawking, MD PhD Stroke Neurology 08/08/2019 5:16 PM   To contact Stroke Continuity provider, please refer to [email or web address] After hours, contact General Neurology

## 2019-08-08 NOTE — Plan of Care (Signed)

## 2019-08-09 LAB — GLUCOSE, CAPILLARY
Glucose-Capillary: 100 mg/dL — ABNORMAL HIGH (ref 70–99)
Glucose-Capillary: 106 mg/dL — ABNORMAL HIGH (ref 70–99)
Glucose-Capillary: 153 mg/dL — ABNORMAL HIGH (ref 70–99)
Glucose-Capillary: 166 mg/dL — ABNORMAL HIGH (ref 70–99)
Glucose-Capillary: 195 mg/dL — ABNORMAL HIGH (ref 70–99)
Glucose-Capillary: 90 mg/dL (ref 70–99)

## 2019-08-09 NOTE — Progress Notes (Addendum)
STROKE TEAM PROGRESS NOTE   INTERVAL HISTORY  No family at bedside. Pt initially sleepy but easily arousable, left thumb up to me. No acute event overnight. Left scalp would cdi.    Vitals:   08/08/19 2343 08/09/19 0322 08/09/19 0338 08/09/19 0351  BP:   120/72   Pulse: (!) 108 88 89   Resp:  20 (!) 22   Temp:   99 F (37.2 C)   TempSrc:   Oral   SpO2:  96% 99%   Weight:    95 kg  Height:       CBC:  Recent Labs  Lab 08/06/19 0348 08/07/19 0516  WBC 6.6 5.0  HGB 11.9* 12.3*  HCT 37.8* 39.8  MCV 94.5 96.1  PLT 260 465   Basic Metabolic Panel:  Recent Labs  Lab 08/06/19 0348 08/07/19 0516  NA 134* 137  K 4.2 4.0  CL 99 98  CO2 28 31  GLUCOSE 120* 95  BUN 17 17  CREATININE 0.66 0.68  CALCIUM 9.0 8.8*    IMAGING past 24 hours No results found.   PHYSICAL EXAM    General- mildly obese middle-aged Caucasian male, s/p tracheostomy on trach collar  Ophthalmologic- fundi not visualized due to noncooperation.  Cardiovascular - Regular rate and rhythm.  Neuro - s/p tracheostomy on trach collar. Initially sleepy but easily arousable, global aphasia, left gaze preference position, barely cross midline, notblinking to visual threat on the left, PERRL. Right facial droop. Tongue protrusion not cooperative. LUE spontaneous movement at least 4/5, LLE against gravity at least 3/5, right UE and LE flaccid, slight 1/5 movement with pain at RLE. DTR 1+ and no babinski. Sensation, coordination and gait not tested.    ASSESSMENT/PLAN Lee Hooper is a 49 y.o. male with history of COPD, ongoing tobacco use, and psoriasis, presenting with dense LMCA syndrome. Attempt for Left ICA and Left M1 thrombectomy was done emergently with TICI 2b flow. Unfortunately, the pt had post procedure hemorrhage and reocclusion of the vessels. F/u imaging suggested early edema and pt underwent prophylactic decompressive hemicraniectomy.  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 Stroke etiology unknown - given DVT requiring eventual AC and high mRS, will not do further embolic workup  Code Stroke CT head showed early hypodensity in nearly entire LMCA. ASPECTS 6-7 per notes.  CTA head & neck occlusion cervical ICA on the left. This is most likely due to dissection. The patient has minimal atherosclerotic disease.  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  There is poor collateral circulation on the left.   CTP CBF large core volume of 266 mL, perfusion volume 313 mL, mismatch volume 47 mL.  ASPECTS using CTP by Dr. Carlis Abbott 6 or 7.   IR - TICI2c revascularization  MRI  Restricted diffusion throughout the majority of the left MCA and ACA vascular territories consistent with acute ischemia.  MRA - reocclusion of the intracranial left internal carotid artery. No flow related signal is seen within the left middle cerebral artery.  CT Head 07/04/19 - Slightly increased rightward midline shift with a small amount of herniation beneath the anterior falx. Otherwise unchanged  examination.  CT repeat 5/6 stable infarcts and SAH but increasing edema w/ midline shift now 67mm and herniation through crani site  CT repeat 5/21 decreased shift 5->76mm. Extensive L crani w/ buldging through defect. Still L ACA and MCA low density w/ petechial hemorrhage but no large hemorrhage.   2D Echo - EF 55 - 60%. No cardiac source of emboli identified.   LDL 45  HgbA1c 5.5  Subq heparin for VTE prophylaxis  No antithrombotic prior to admission, now on Eliquis.  Therapy recommendations:  SNF   Disposition:  Pending - LTACH consulted however no insurance    Respiratory failure s/p Trach Trach wound  Intubated on vent  CCM on board  CXR 07/03/19- New consolidation in the right lower lobe and left infrahilar region concerning for pneumonia. Possible small layering right effusion.  CXR 5/3 mild pulm edema w/ atx vs infiltrate/aspiration  On cefepime 4/30>>5/2 for pneumonia  Augmentin 5/2>>5/4  Unasyn 5/5>>5/6  Trach placed 5/8 per CCM  WOC consulted for trach wound ->progressed to stage 3 pressure, for dressing change q3d and prn  Fever and elevated white count and tachycardia hence restarted on Vanc & Cefepime 5/11>>5/13  Ancef 5/14>>5/17  (pneumonia)  On trach collar since 5/14 w/ high FiO2 requirements  Downsized to cuffless trach 6 by CCM 5/24  On trach collar  Cerebral edema, resolved Hypernatremia, resolved  MRI - Restricted diffusion throughout the majority of the left MCA and ACA vascular territories consistent with acute ischemia.  CT Head 07/04/19 - Slightly increased rightward midline shift with a small amount of herniation beneath the anterior falx. Otherwise unchanged examination.  S/p decompressive left hemicraniectomy  4/28 Dr Franky Macho  CT 5/6 - stable infarcts and SAH but increasing edema w/ midline shift now 92mm and herniation through crani site  CT head 5/20 - decreased shift 5->20mm. Extensive L crani w/ buldging through defect. Still L ACA and MCA low density w/ petechial hemorrhage but no large hemorrhage.  completed keppra 2 wk course per NSG  Off 3% and D5NS  Okay to allow Na gradually normalize  Na normalized   Sepsis, resolved Fever w/ leukocytosis, resolved HCAP, resolved, ? Recurrence Hypotension, resolved  TMax 100.2->100.3->afebrile  Leukocytosis, resolved   UA neg  Sputum moraxella, H. flu   On cefepime 4/30>>5/2; Augmentin 5/2>>5/4, Unasyn 5/5>>5/6 -> off  Blood culture 5/2 no growth  Blood cultures 5/7 - no growth   Resp cultures 5/7 - no growth  CXR 5/9 - streaky BLL  opacilites atx/consolication/PNA. Possible R pleural effusion  CXR 5/10 w/ atx vs infiltrate  CXR 07/15/19 - Stable bibasilar atelectasis a small right pleural effusion. No new opacity evident. Stable cardiac silhouette  Sputum cx 5/11 reincubated   Blood Cx 5/11 no growth   Vanc 5/11>>5/13, Cefepime 5/11>>5/13  Ancef 5/14>>5/17  (pneumonia)  CXR 5/18 slight improvement in bibasilar atx  CXR 5/24 increased vol loss RLL w/ streaky opacities, worsening atx, ? PNA. Minor LLL atx.  Vanc 5/24>>off  Cefepime 5/24>>5/30  UA WNL  CXR 07/27/19 - Increased volume loss at the right lung base with streaky opacities, favor worsening atelectasis. Pneumonia is also considered in the setting of elevated temperature.  Bilateral Lower Ext DVT, extensive Possible PE given tachycardia  Venous US 07/11/2019 DVT + bilateral extensive  IVC filter placed 5/9  On eliquis now for DVT prophylaxis  Hypertension  Home meds:  None  Current meds: none scheduled  SBP goal < 160 due to hemorrhagic conversion . Long-term BP  goal normotensive  Dysphagia   NPO  Coretrak 5/7  PEG placement 5/14  FW 150 Q4h   On bolus TF  Ileus, resolved  Abd distension w/ decreased BS  KUB 5/2 distended colonic loop  Miralax   Flexiseal following laxatives  On reglan, laxatives on hold, decrease fentanyl  Coretrak 5/7  PEG 5/14  TF resumed after PEG  CSF leakage  Crani sutures out -> suture incision leaking 5/16 - s/p local suture  Again From left scalp incision site 6/1  NSG on board ([facility])  S/p wound repair with Dr. Franky Macho 6/2  Acute Renal Failure, resolved  Renal US - increased cortical echogenicity in B kidneys c/w renal dz. Increased echogenicity of liver ?hepatic steatosis.  Urine studies - elevated protein creatinine ratio, Na normal, UN normal  Creatinine - 0.68   Diuresis - per CCM in ICU  Tobacco abuse  Current smoker  Smoking cessation counseling will be  provided if able  Other Stroke Risk Factors  Obesity, Body mass index is 28.4 kg/m., recommend weight loss, diet and exercise as appropriate    Likely undiagnosed obstructive sleep apnea based on body habitus  Other Active Problems  Hypokalemia - resolved - 4.0  Thrombocytopenia - resolved  Elevated LFT - AST/ALT 54/102, improved    Constipation. Miralax daily -> loose stool -> miralax discontinued -> add imodium   Charm trial for cytotoxic edema  Mild anemia - Hb - 11.8->12.2->11.9->12.3  Mild hyponatremia - Na 134->137  Hospital day # 39  Continue ongoing medical management.  Patient medically stable to be transferred to skilled nursing facility but difficult to place because of lack of insurance.  Discussed with Dr. Mikal Plane plan to do replacement cranioplasty surgery prior to nursing home discharge.  Greater than 50% time during this 25-minute visit was spent in counseling and coordination of care and discussion with care team about his care. Delia Heady, MD To contact Stroke Continuity provider, please refer to [email or web address]. After hours, contact General Neurology

## 2019-08-09 NOTE — Plan of Care (Signed)

## 2019-08-10 LAB — CBC
HCT: 40.5 % (ref 39.0–52.0)
Hemoglobin: 12.9 g/dL — ABNORMAL LOW (ref 13.0–17.0)
MCH: 30.8 pg (ref 26.0–34.0)
MCHC: 31.9 g/dL (ref 30.0–36.0)
MCV: 96.7 fL (ref 80.0–100.0)
Platelets: 255 10*3/uL (ref 150–400)
RBC: 4.19 MIL/uL — ABNORMAL LOW (ref 4.22–5.81)
RDW: 17.4 % — ABNORMAL HIGH (ref 11.5–15.5)
WBC: 4 10*3/uL (ref 4.0–10.5)
nRBC: 0 % (ref 0.0–0.2)

## 2019-08-10 LAB — BASIC METABOLIC PANEL
Anion gap: 10 (ref 5–15)
BUN: 20 mg/dL (ref 6–20)
CO2: 29 mmol/L (ref 22–32)
Calcium: 9 mg/dL (ref 8.9–10.3)
Chloride: 97 mmol/L — ABNORMAL LOW (ref 98–111)
Creatinine, Ser: 0.72 mg/dL (ref 0.61–1.24)
GFR calc Af Amer: >60 mL/min (ref >60)
GFR calc non Af Amer: >60 mL/min (ref >60)
Glucose, Bld: 99 mg/dL (ref 70–99)
Potassium: 4.1 mmol/L (ref 3.5–5.1)
Sodium: 136 mmol/L (ref 135–145)

## 2019-08-10 LAB — GLUCOSE, CAPILLARY
Glucose-Capillary: 100 mg/dL — ABNORMAL HIGH (ref 70–99)
Glucose-Capillary: 101 mg/dL — ABNORMAL HIGH (ref 70–99)
Glucose-Capillary: 110 mg/dL — ABNORMAL HIGH (ref 70–99)
Glucose-Capillary: 133 mg/dL — ABNORMAL HIGH (ref 70–99)
Glucose-Capillary: 149 mg/dL — ABNORMAL HIGH (ref 70–99)
Glucose-Capillary: 203 mg/dL — ABNORMAL HIGH (ref 70–99)

## 2019-08-10 LAB — AEROBIC/ANAEROBIC CULTURE W GRAM STAIN (SURGICAL/DEEP WOUND): Culture: NORMAL

## 2019-08-10 NOTE — Evaluation (Signed)
Speech Language Pathology Evaluation Patient Details Name: Lee Hooper MRN: [id number] DOB: 1970/10/15 Today's Date: 08/10/2019 Time: 1325-1405 SLP Time Calculation (min) (ACUTE ONLY): 40 min  Problem List:  Patient Active Problem List   Diagnosis Date Noted  . 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 Pressure injury of skin 07/23/2019  . Tracheostomy status (HCC)   . DVT (deep vein thrombosis) in pregnancy   . HCAP (healthcare-associated pneumonia)   . Hypokalemia   . Acute respiratory failure (HCC)   . Stroke (HCC) 07/01/2019  . Middle cerebral artery embolism, left 07/01/2019  . 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 No pertinent past medical history. Past Surgical History:  Past Surgical History:  Procedure Laterality Date  . CRANIOTOMY Left 07/01/2019   Procedure: Left Hemicraniectomy;  Surgeon: Coletta Memos, MD;  Location: [facility] OR;  Service: Neurosurgery;  Laterality: Left;  . ESOPHAGOGASTRODUODENOSCOPY N/A 07/17/2019   Procedure: ESOPHAGOGASTRODUODENOSCOPY (EGD);  Surgeon: Diamantina Monks, MD;  Location: [facility] ENDOSCOPY;  Service: General;  Laterality: N/A;  . IR CT HEAD LTD  07/01/2019  . IR IVC FILTER PLMT / S&I /IMG GUID/MOD SED  07/12/2019  . IR PERCUTANEOUS ART THROMBECTOMY/INFUSION INTRACRANIAL INC DIAG ANGIO  07/01/2019  . LESION EXCISION N/A 08/05/2019   Procedure: Scalp Wound Revision;  Surgeon: Coletta Memos, MD;  Location: [facility] OR;  Service: Neurosurgery;  Laterality: N/A;  left,posterior  . PEG PLACEMENT N/A 07/17/2019   Procedure: PERCUTANEOUS ENDOSCOPIC GASTROSTOMY (PEG) PLACEMENT;  Surgeon: Diamantina Monks, MD;  Location: [facility] ENDOSCOPY;  Service: General;  Laterality: N/A;  . RADIOLOGY WITH ANESTHESIA N/A 07/01/2019   Procedure: IR WITH ANESTHESIA;  Surgeon: Radiologist, Medication, MD;  Location: [facility] OR;  Service: Radiology;   Laterality: N/A;   HPI:  This 49 y.o. male admitted 4/28 with Rt sided hemiplegia and aphasia.  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  Repeat CT following procedure showed moderate basal ganglia hemorrhage vs. stroke   Repeat MRI revealed that the ICA and MCA re-occluded with change in FLAIR sequence excluding him from repeat thrombectomy.  He underwent a decompressive hemicraniectomy.  He waws intubated 4/28.  He developed an ileus 5/2. Trach 5/8. IVC filter placed 5/9.  Peg placement.  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 He is alert, able to focus attention to speaker in both visual fields, but sustains attention on the left. He makes groping attempts to articulate and phonate, demosntrates ability to volitionally increase breath for increased volume today, but did not always coordinate this with phonation. Articulation presents as a repetitive tongue tapping with whispering in most cases. Pt actively attempted to repeat words with max cueing, but had minimal accuraye in articualtory placement on "Dillon" "Maxcine Ham" "hello" and counting. During counting pt did count accurately with his fingers and also used communicative gestures accurately (thumbs up, head nods and shakes to Y/N want needs questions). Pt follows context well, but did not follow verbal only commands, always required visual cueing. Pt is stimulable for therapeutic interventions, SLP will follow acutely.     SLP Assessment  SLP Recommendation/Assessment: Patient needs continued Speech Lanaguage Pathology Services SLP Visit Diagnosis: Apraxia (R48.2);Aphasia (R47.01)    Follow Up Recommendations  Inpatient Rehab    Frequency and Duration min 2x/week  2 weeks  SLP Evaluation Cognition  Overall Cognitive Status: Impaired/Different from baseline Arousal/Alertness:  Awake/alert Orientation Level: Oriented to person;Oriented to place Attention: Focused;Sustained Focused Attention: Appears intact Sustained Attention: Impaired Sustained Attention Impairment: Verbal basic Memory: (UTA) Awareness: Impaired Awareness Impairment: Intellectual impairment;Emergent impairment Safety/Judgment: Impaired       Comprehension  Auditory Comprehension Overall Auditory Comprehension: Impaired Yes/No Questions: Impaired Basic Biographical Questions: 51-75% accurate Other Yes/No Questions Comments`: Moderate Commands: (consistently responded Y/N to want/needs questions) Conversation: Simple Reading Comprehension Reading Status: Not tested    Expression Verbal Expression Overall Verbal Expression: Impaired Initiation: Impaired Automatic Speech: Counting Level of Generative/Spontaneous Verbalization: Word Repetition: Impaired Level of Impairment: Word level Naming: Not tested Effective Techniques: Articulatory cues;Phonemic cues;Melodic intonation Written Expression Dominant Hand: Left   Oral / Motor  Oral Motor/Sensory Function Overall Oral Motor/Sensory Function: Moderate impairment Facial ROM: Reduced right;Suspected CN VII (facial) dysfunction Facial Symmetry: Abnormal symmetry right;Suspected CN VII (facial) dysfunction Facial Strength: Reduced right;Suspected CN VII (facial) dysfunction Facial Sensation: Reduced right;Suspected CN V (Trigeminal) dysfunction Lingual ROM: (pt would not protrude tongue) Motor Speech Overall Motor Speech: Impaired Respiration: Within functional limits Phonation: Normal Articulation: Impaired Level of Impairment: Word Intelligibility: Intelligibility reduced Word: 0-24% accurate Phrase: Not tested Sentence: Not tested Conversation: Not tested Motor Planning: Impaired Level of Impairment: Word Motor Speech Errors: Groping for words;Inconsistent   GO                   Herbie Baltimore, MA CCC-SLP  Acute  Rehabilitation Services Pager [phone] Office [phone]  Lynann Beaver 08/10/2019, 2:52 PM

## 2019-08-10 NOTE — Progress Notes (Signed)
Patient ID: Lee Hooper, male   DOB: [birth year], 49 y.o.   MRN: [id number] Wound is clean, dry. No signs of infection Follows some commands Interactive with people and environment. Improving

## 2019-08-10 NOTE — Progress Notes (Signed)
STROKE TEAM PROGRESS NOTE   INTERVAL HISTORY  No family at bedside. Pt  sleepy but easily arousable, follows commands on the left side well.  No acute event overnight..  Patient's nurse at the bedside.  Vital signs are stable.  No neurological changes.  CBC and BMP unremarkable Vitals:   08/10/19 0743 08/10/19 1147 08/10/19 1204 08/10/19 1220  BP:    112/75  Pulse:  (!) 104  (!) 102  Resp:  (!) 25  (!) 21  Temp: 97.8 F (36.6 C)  98.2 F (36.8 C) 98.3 F (36.8 C)  TempSrc:   Oral   SpO2:  96%  96%  Weight:      Height:       CBC:  Recent Labs  Lab 08/07/19 0516 08/10/19 0332  WBC 5.0 4.0  HGB 12.3* 12.9*  HCT 39.8 40.5  MCV 96.1 96.7  PLT 263 255   Basic Metabolic Panel:  Recent Labs  Lab 08/07/19 0516 08/10/19 0332  NA 137 136  K 4.0 4.1  CL 98 97*  CO2 31 29  GLUCOSE 95 99  BUN 17 20  CREATININE 0.68 0.72  CALCIUM 8.8* 9.0    IMAGING past 24 hours No results found.   PHYSICAL EXAM  General- mildly obese middle-aged Caucasian male, s/p tracheostomy on trach collar  Ophthalmologic- fundi not visualized due to noncooperation.  Cardiovascular - Regular rate and rhythm.  Neuro - s/p tracheostomy on trach collar. Initially sleepy but easily arousable, global aphasia, left gaze preference position, barely cross midline, notblinking to visual threat on the left, PERRL. Right facial droop. Tongue protrusion not cooperative. LUE spontaneous movement at least 4/5, LLE against gravity at least 3/5, right UE and LE flaccid, slight 1/5 movement with pain at RLE. DTR 1+ and no babinski. Sensation, coordination and gait not tested.    ASSESSMENT/PLAN Lee Hooper is a 49 y.o. male with history of COPD, ongoing tobacco use, and psoriasis, presenting with dense LMCA syndrome. Attempt for Left ICA and Left M1 thrombectomy was done emergently with TICI 2b flow. Unfortunately, the pt had post procedure hemorrhage and reocclusion of the vessels. F/u imaging  suggested early edema and pt underwent prophylactic decompressive hemicraniectomy.  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 Stroke etiology unknown - given DVT requiring eventual AC and high mRS, will not do further embolic workup  Code Stroke CT head showed early hypodensity in nearly entire LMCA. ASPECTS 6-7 per notes.  CTA head & neck occlusion cervical ICA on the left. This is most likely due to dissection. The patient has minimal atherosclerotic disease.  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  There is poor collateral circulation on the left.   CTP CBF large core volume of 266 mL, perfusion volume 313 mL, mismatch volume 47 mL.  ASPECTS using CTP by Dr. Chestine Spore 6 or 7.   IR - TICI2c revascularization  MRI  Restricted diffusion throughout the majority of the left MCA and ACA vascular territories consistent with acute ischemia.  MRA - reocclusion of the intracranial left internal carotid artery. No flow related signal is seen within the left middle cerebral artery.  CT Head  07/04/19 - Slightly increased rightward midline shift with a small amount of herniation beneath the anterior falx. Otherwise unchanged examination.  CT repeat 5/6 stable infarcts and SAH but increasing edema w/ midline shift now 51mm and herniation through crani site  CT repeat 5/21 decreased shift 5->47mm. Extensive L crani w/ buldging through defect. Still L ACA and MCA low density w/ petechial hemorrhage but no large hemorrhage.   2D Echo - EF 55 - 60%. No cardiac source of emboli identified.   LDL 45  HgbA1c 5.5  Subq heparin for VTE prophylaxis  No antithrombotic prior to  admission, now on Eliquis.  Therapy recommendations:  SNF   Disposition:  Pending - LTACH consulted however no insurance   Respiratory failure s/p Trach Trach wound  Intubated on vent  CCM on board  CXR 07/03/19- New consolidation in the right lower lobe and left infrahilar region concerning for pneumonia. Possible small layering right effusion.  CXR 5/3 mild pulm edema w/ atx vs infiltrate/aspiration  On cefepime 4/30>>5/2 for pneumonia  Augmentin 5/2>>5/4  Unasyn 5/5>>5/6  Trach placed 5/8 per CCM  WOC consulted for trach wound ->progressed to stage 3 pressure, for dressing change q3d and prn  Fever and elevated white count and tachycardia hence restarted on Vanc & Cefepime 5/11>>5/13  Ancef 5/14>>5/17  (pneumonia)  On trach collar since 5/14 w/ high FiO2 requirements  Downsized to cuffless trach 6 by CCM 5/24  On trach collar  Cerebral edema, resolved Hypernatremia, resolved  MRI - Restricted diffusion throughout the majority of the left MCA and ACA vascular territories consistent with acute ischemia.  CT Head 07/04/19 - Slightly increased rightward midline shift with a small amount of herniation beneath the anterior falx. Otherwise unchanged examination.  S/p decompressive left hemicraniectomy  4/28 Dr Christella Noa  CT 5/6 - stable infarcts and SAH but increasing edema w/ midline shift now 66mm and herniation through crani site  CT head 5/20 - decreased shift 5->39mm. Extensive L crani w/ buldging through defect. Still L ACA and MCA low density w/ petechial hemorrhage but no large hemorrhage.  completed keppra 2 wk course per NSG  Off 3% and D5NS  Okay to allow Na gradually normalize  Na normalized   Sepsis, resolved Fever w/ leukocytosis, resolved HCAP, resolved, ? Recurrence Hypotension, resolved  TMax 100.2->100.3->afebrile  Leukocytosis, resolved   UA neg  Sputum moraxella, H. flu   On cefepime 4/30>>5/2; Augmentin 5/2>>5/4, Unasyn 5/5>>5/6 ->  off  Blood culture 5/2 no growth  Blood cultures 5/7 - no growth   Resp cultures 5/7 - no growth  CXR 5/9 - streaky BLL opacilites atx/consolication/PNA. Possible R pleural effusion  CXR 5/10 w/ atx vs infiltrate  CXR 07/15/19 - Stable bibasilar atelectasis a small right pleural effusion. No new opacity evident. Stable cardiac silhouette  Sputum cx 5/11 reincubated   Blood Cx 5/11 no growth   Vanc 5/11>>5/13, Cefepime 5/11>>5/13  Ancef 5/14>>5/17  (pneumonia)  CXR 5/18 slight improvement in bibasilar atx  CXR 5/24 increased vol loss RLL w/ streaky opacities, worsening atx, ? PNA. Minor LLL atx.  Vanc 5/24>>off  Cefepime 5/24>>5/30  UA WNL  CXR 07/27/19 - Increased volume loss at the right lung base with streaky opacities, favor worsening atelectasis. Pneumonia is also considered in the setting of elevated temperature.  Bilateral Lower Ext DVT, extensive Possible PE given tachycardia  Venous US 07/11/2019 DVT + bilateral extensive  IVC filter placed 5/9  On eliquis now for DVT prophylaxis  Hypertension  Home meds:  None  Current meds: none scheduled  SBP goal < 160 due to hemorrhagic conversion . Long-term BP goal normotensive  Dysphagia   NPO  Coretrak 5/7  PEG placement 5/14  FW 150 Q4h   On bolus TF  Ileus, resolved  Abd distension w/ decreased BS  KUB 5/2 distended colonic loop  Miralax   Flexiseal following laxatives  On reglan, laxatives on hold, decrease fentanyl  Coretrak 5/7  PEG 5/14  TF resumed after PEG  CSF leakage  Crani sutures out -> suture incision leaking 5/16 - s/p local suture  Again From left scalp incision site 6/1  NSG on board ([facility])  S/p wound repair with Dr. Franky Macho 6/2  Acute Renal Failure, resolved  Renal US - increased cortical echogenicity in B kidneys c/w renal dz. Increased echogenicity of liver ?hepatic steatosis.  Urine studies - elevated protein creatinine ratio, Na normal, UN  normal  Creatinine - 0.68   Diuresis - per CCM in ICU  Tobacco abuse  Current smoker  Smoking cessation counseling will be provided if able  Other Stroke Risk Factors  Obesity, Body mass index is 28.29 kg/m., recommend weight loss, diet and exercise as appropriate    Likely undiagnosed obstructive sleep apnea based on body habitus  Other Active Problems  Hypokalemia - resolved - 4.0  Thrombocytopenia - resolved  Elevated LFT - AST/ALT 54/102, improved    Constipation. Miralax daily -> loose stool -> miralax discontinued -> add imodium   Charm trial for cytotoxic edema  Mild anemia - Hb - 11.8->12.2->11.9->12.3  Mild hyponatremia - Na 134->137  Hospital day # 40 Continue present treatment.  No changes Delia Heady, MD Stroke Neurology 08/10/2019 12:50 PM   To contact Stroke Continuity provider, please refer to [email or web address]. After hours, contact General Neurology

## 2019-08-10 NOTE — Progress Notes (Addendum)
NAME:  Lee Hooper, MRN:  [id number], DOB:  07-Nov-1970, LOS: 40 ADMISSION DATE:  07/01/2019, CONSULTATION DATE:  07/01/19 REFERRING MD:  Aroor MD, CHIEF COMPLAINT:  CVA    Brief History   Aleksandar Duve is a 49 yo M who presented as a code stroke after being found aphasic and nonverbal by his wife. CT/CTA studies showed left ICA/MCA thrombosis and pt was taken for IR thrombectomy for left ICA and left M1 occlusions. Repeat head CT was obtained following the procedure out of concern for possible hemorrhage and showed moderate basal ganglia hemorrhage vs stroke. Repeat MRI revealed that the ICA and MCA re-occluded with change in FLAIR sequence excluding him from repeat thrombectomy. He underwent a L hemicraniectomy. He was intubated for airway protection.  Past Medical History  Psoriasis  COPD  Tobacco Use  Significant Hospital Events   4/28: Admit; L ICA/MCA Thrombectomy with re-occlusion --> left hemicraniectomy, intubated for acute hypoxic respiratory failure  5/3: NAE. Was taken off precedex yesterday and pt opened his eyes. Prednisone 50 mg qd was begun. He had a persistent fever around 38.2, HR 110s, BP 140s/60s. Pt required increased O2 with secretions; CXR showed mild edema with small effusions c/f aspiration vs opacification. Blood culture x2 and urine culture collected, NGTD. ABG pending. Currently satting 92% on PRVC FiO2 80, PEEP 10, Plateau 22. WBC declined to 10.5 from 11.1, Na 151 from 156. Tube feeds were held d/t distended colonic loop in RLQ on KUB, to be continued today. Thrombocytopenia improved to 93 from 81.  5/4: Patient remains intubated with spontaneous movement of left arm and leg.  5/5: NAE. Pt remains intubated on PRN fetanyl. Will open eyes and spontaneously move left leg and arm.  5/6: NAE overnight. Intubated with no sedation. 5/7: NAE. Marland Kitchen Completed Unasyn yesterday. VSS, spO2 remains in low 90s the majority of the time on 620/40%/+5. Morning labs significant  for HCO3 of 19. Otherwise unremarkable.  5/8: Ongoing fever.  Obtained ultrasound, positive for lower extremity DVT bilaterally.  Tracheostomy placed at bedside.,  Did have some SVT this responded to treating fever and rate control. 5/9: IVC filter placed 5/11: no acute events overnight. Pt with similar exam as previous however tachycardic and febrile (tmax102.7) with a bit increased wbc and new R infiltrate noted in base. Pan cx and start empiric abx. 5/12 Tolerated 8 hours of trach collar  5/17 completed antibiotics, added IV Lasix, for ongoing high supplemental oxygen requirements.  Because of this we delayed transfer out 5/18 improved oxygenation, improved x-ray.  Weaning oxygen.  Transitioning to aerosol trach collar 5/18 to 5/24: making slow progress on I&O balance but oxygen requirements much improved. DOAC started 5/23 w/ no new issues. Will ask RT to change to cuffless.  Consults:  IR PCCM  Neuro (Primary) Neurosurgery  Procedures:  4/28 IR - L ICA/MCA Thrombectomy 4/28 L hemicraniectomy 4/28 Intubation 5/08 Trach   Significant Diagnostic Tests:  4/28 Admit CT Head Code Stroke:  Hyperdense left ICA and MCA compatible with acute thrombus. No acute infarct or hemorrhage.  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 The patient has minimal atherosclerotic disease. The left internal carotid artery is occluded through the terminus and extending in the left M1 M2 and M3 branches. There is poor collateral circulation on the left. There is a large territory infarct involving the left hemisphere. Infarct volume 266 mL.  4/28 Admit  MR Brain:  Restricted diffusion throughout much of the left MCA vascular territory consistent with acute ischemia. Restricted diffusion consistent with acute ischemia is also present within the paramedian left frontoparietal lobes, although this is less well assessed  due to the degree of motion degradation at the level of the vertex. There is little if any corresponding T2/FLAIR hyperintensity at these sites. No significant mass effect. No midline shift.  4/28 Post Thrombectomy CT Head: : 1. 3.0 x 2.6 x 3.9 cm region of hyperdensity centered within the left basal ganglia, left subinsular region and inferomedial left temporal lobe likely reflecting a combination of parenchymal hematoma and contrast staining.  2. Edema with loss of gray-white differentiation within the left basal ganglia, left insula and anterior left temporal lobe, likely acute infarction. Subtle changes of acute infarction are also suspected within the paramedian left frontal lobe ACA vascular territory. 3. Scattered small-volume subarachnoid hemorrhage along the left cerebral hemisphere. 4. Regional mass effect with effacement of the left lateral ventricle temporal horn. No midline shift.  4/28 Post Thrombectomy MR Brain:  1. Restricted diffusion throughout the majority of the left MCA and ACA vascular territories consistent with acute ischemia. 2. No significant mass effect at this time. No midline shift. 3. The acute parenchymal hemorrhage and/or contrast staining centered within left basal ganglia and inferomedial left temporal lobe on prior head CT does not appear significantly changed. Based on the MR appearance, it is suspected that a significant component of this previously demonstrated hyperdensity reflects contrast staining. 4. Redemonstrated small volume subarachnoid hemorrhage overlying the left cerebral hemisphere. Small volume subarachnoid hemorrhage is also questioned along the right cerebral hemisphere posteriorly.  4/29 Post Hemicraniectomy CT head:  Complete left ACA/MCA territory infarction with swollen brain bulging through craniectomy defect. No midline shift or entrapment. Petechial hemorrhage at left basal ganglia. Extraaxial hemorrhage along surface of the infarct.    5/1 Head CT > Slightly increased rightward midline shift with a small amount of herniation beneath the anterior falx. Otherwise unchanged Examination.  5/6 Head CT: Left ACA and MCA territory infarct with unchanged degree of subarachnoid hemorrhage. Swelling has progressed with greater herniation through the craniectomy defect and 5 mm of midline shift.  5/8: Bilateral acute DVT involving the right common femoral vein right proximal profundal vein and right popliteal vein also left DVT involving same vessels  5/12 Renal US: Technically challenging exam due to patient's intubated status and difficulty with repositioning.  Diffusely increased cortical echogenicity in both kidneys compatible with medical renal disease.  Increased echogenicity of the liver with diminished through transmission, most often reflective of hepatic steatosis.  5/20 Ct H> significant motion artifact. Continued low density and swelling of L hemisphere in L ACA and MCA idstributions. Areas of petechial hemorrhage, no large hematoma. L craniectomy w bulging of brain through defect. Reduced mass effect with L to R shift now 63mm from 48mm previously  Micro Data:  4/28: SARS-CoV-2/Influenza A/Influenza B PCR: Negative  4/30: Respiratory culture: Abundant Moraxella Catarrhalis, Haemophilus Influenzae, beta lactamase positive  5/2: Urine culture: NGTD  5/2: BC x2: NGTD 5/7 resp >> H flu 5/7 bld >> negative 5/11 BCx2 >> negative 5/11 trach asp >> E coli  Antimicrobials:  4/30 - 5/2: Cefepime 5/2 - 5/4: Augmentin  5/5 - 5/6 unasyn   Vanc 5/11 >> 5/13 Cefepime 5/11 >> 5/13 Ancef 5/14 >> 5/17  Interim history/subjective:   No issues overnight. Resting comfortably in bed with tracheostomy.  Objective   Blood pressure 110/81, pulse Marland Kitchen)  104, temperature 97.8 F (36.6 C), resp. rate (!) 25, height 6' (1.829 m), weight 94.6 kg, SpO2 96 %.    FiO2 (%):  [28 %] 28 %   Intake/Output Summary (Last 24 hours) at 08/10/2019  1155 Last data filed at 08/10/2019 1122 Gross per 24 hour  Intake --  Output 3200 ml  Net -3200 ml   Filed Weights   08/08/19 0338 08/09/19 0351 08/10/19 0404  Weight: 96.9 kg 95 kg 94.6 kg    Examination: General: Middle-aged male resting in bed HEENT: 6 cuffless trach in place, small ulceration starting to healed just below the insertion site and anterior cusp of the ostomy Neuro: Following commands moves left side CV: Regular rate rhythm, S1-S2 PULM: No crackles no wheeze GI: Soft, nontender nondistended Extremities: No significant edema Skin: No rash, hypertrophy over extensor surfaces of lower extremities   Resolved Hospital Problem list   treated HCAP with Moraxella and Haemophilus influenza, these were beta-lactamase positive, antibiotics were completed 5/6; COPD exacerbation. Ileus, AKI, hypernatremia Completed E. coli HCAP treatment on 5/18  Assessment & Plan:   Acute hypoxic respiratory failure with compromised airway. Failure to wean s/p tracheostomy. Hx of smoking with presumed COPD. Large Lt ICA/MCA CVA s/p thrombectomy and decompressive hemicraniectomy 2nd to cerebral edema and hemorrhage. Mild hypernatremia-->improved w/ Free water replacement  B/l lower extremity DVT.- s/p IVC filter 5/09, now on [facility] SVT.  Plan Continue trach collar trials Possibly able to go to room air without O2 supplementation. Continue routine trach care Pulmonary will follow for tracheostomy management. I'm not sure he would be able to tolerate having the tracheostomy removed even though he oxygenates fine. I could not get him to have a cough to clear his airway. We'll continue to monitor. Can attempt capping trials as tolerated  Pulmonary will see as needed for tracheostomy care.  Best practice:  Diet: EN  DVT prophylaxis:  IVC filter 5/9, on hep gtt GI prophylaxis: Protonix Mobility: PT/ OT Code Status: FULL Disposition: neuro floor    Garner Nash, DO [facility]  Critical Care 08/10/2019 11:55 AM

## 2019-08-10 NOTE — Plan of Care (Signed)
°  Problem: Education: °Goal: Knowledge of disease or condition will improve °Outcome: Progressing °  °Problem: Nutrition: °Goal: Risk of aspiration will decrease °Outcome: Progressing °  °Problem: Safety: °Goal: Ability to remain free from injury will improve °Outcome: Progressing °  °

## 2019-08-10 NOTE — Consult Note (Signed)
WOC Nurse wound follow up Wound type: MDRPI to skin immediately below trach, healing Stage 3 Measurement: 0.2 x 0.2cm x 0.1cm Wound FIE:PPIR, moiost Drainage (amount, consistency, odor) scant light yellow Periwound:with evidence of previous wound healing Dressing procedure/placement/frequency:Continue POC using split gauze with routine cleansing and changing (each shift and PRN).  WOC nursing team will continue to follow until healed, seeing every 7-10 days, and will remain available to this patient, the nursing and medical teams.   Thanks, Ladona Mow, MSN, RN, GNP, [place]  Pager# [phone]

## 2019-08-10 NOTE — Evaluation (Signed)
Clinical/Bedside Swallow Evaluation Patient Details  Name: Lee Hooper MRN: [id number] Date of Birth: 09/29/1970  Today's Date: 08/10/2019 Time: SLP Start Time (ACUTE ONLY): 1325 SLP Stop Time (ACUTE ONLY): 1405 SLP Time Calculation (min) (ACUTE ONLY): 40 min  Past Medical History: History reviewed. No pertinent past medical history. Past Surgical History:  Past Surgical History:  Procedure Laterality Date  . CRANIOTOMY Left 07/01/2019   Procedure: Left Hemicraniectomy;  Surgeon: Lee Pall, MD;  Location: [place];  Service: Neurosurgery;  Laterality: Left;  . ESOPHAGOGASTRODUODENOSCOPY N/A 07/17/2019   Procedure: ESOPHAGOGASTRODUODENOSCOPY (EGD);  Surgeon: Lee Oka, MD;  Location: [facility] ENDOSCOPY;  Service: General;  Laterality: N/A;  . IR CT HEAD LTD  07/01/2019  . IR IVC FILTER PLMT / S&I /IMG GUID/MOD SED  07/12/2019  . IR PERCUTANEOUS ART THROMBECTOMY/INFUSION INTRACRANIAL INC DIAG ANGIO  07/01/2019  . LESION EXCISION N/A 08/05/2019   Procedure: Scalp Wound Revision;  Surgeon: Lee Pall, MD;  Location: [place];  Service: Neurosurgery;  Laterality: N/A;  left,posterior  . PEG PLACEMENT N/A 07/17/2019   Procedure: PERCUTANEOUS ENDOSCOPIC GASTROSTOMY (PEG) PLACEMENT;  Surgeon: Lee Oka, MD;  Location: [place];  Service: General;  Laterality: N/A;  . RADIOLOGY WITH ANESTHESIA N/A 07/01/2019   Procedure: IR WITH ANESTHESIA;  Surgeon: Radiologist, Medication, MD;  Location: [place];  Service: Radiology;  Laterality: N/A;   HPI:  This 49 y.o. male admitted 4/28 with Rt sided hemiplegia and aphasia.  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  Repeat CT following procedure showed moderate basal ganglia hemorrhage vs. stroke   Repeat MRI revealed that the ICA and MCA re-occluded with change in FLAIR sequence excluding him from repeat thrombectomy.  He underwent a decompressive hemicraniectomy.  He waws intubated 4/28.  He developed an ileus 5/2. Trach 5/8. IVC filter  placed 5/9.  Peg placement.  PMH includes: COPD, tobacco abuse, psoriasis   Assessment / Plan / Recommendation Clinical Impression  Given promising oral motor movement and pt attention, SLP proceeded with swallow eval at bedside. Pt was attentive to PO; when shown the gesture for drinking from a cup with the question "Do you want some water?" pt nodded enthusiastically yes. Pt tolerated ice chip with a swallow response and look of ecstasy on his face. He also trialed a sip of thin water, a straw sip of nectar thick water and a teaspoon of nectar thick water. Though oral manipulation and swallow response were observed, pt had a strong immediate cough after each trial, despite a long break in between each attempt. Pt shows potential for oral intake but instrumental assessment needed. Will f/u at bedside tomorrow for further attempts and decision regarding type/timing of study.  SLP Visit Diagnosis: Dysphagia, oropharyngeal phase (R13.12)    Aspiration Risk  Severe aspiration risk    Diet Recommendation NPO   Medication Administration: Via alternative means    Other  Recommendations Oral Care Recommendations: Oral care QID   Follow up Recommendations Inpatient Rehab      Frequency and Duration min 2x/week          Prognosis Prognosis for Safe Diet Advancement: Good      Swallow Study   General HPI: This 50 y.o. male admitted 4/28 with Rt sided hemiplegia and aphasia.  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  Repeat CT following procedure showed moderate basal ganglia hemorrhage vs. stroke   Repeat MRI revealed that the ICA and MCA re-occluded with  change in FLAIR sequence excluding him from repeat thrombectomy.  He underwent a decompressive hemicraniectomy.  He waws intubated 4/28.  He developed an ileus 5/2. Trach 5/8. IVC filter placed 5/9.  Peg placement.  PMH includes: COPD, tobacco abuse, psoriasis Type of Study: Bedside Swallow Evaluation Previous Swallow Assessment:  none Diet Prior to this Study: NPO;PEG tube Temperature Spikes Noted: No Respiratory Status: Room air History of Recent Intubation: Yes Length of Intubations (days): 11 days Date extubated: 07/11/19 Behavior/Cognition: Alert;Cooperative Oral Cavity Assessment: Within Functional Limits Oral Care Completed by SLP: No Oral Cavity - Dentition: Adequate natural dentition Vision: Functional for self-feeding Self-Feeding Abilities: Able to feed self Patient Positioning: Upright in bed Baseline Vocal Quality: Low vocal intensity Volitional Cough: Strong;Congested    Oral/Motor/Sensory Function Overall Oral Motor/Sensory Function: Moderate impairment Facial ROM: Reduced right;Suspected CN VII (facial) dysfunction Facial Symmetry: Abnormal symmetry right;Suspected CN VII (facial) dysfunction Facial Strength: Reduced right;Suspected CN VII (facial) dysfunction Facial Sensation: Reduced right;Suspected CN V (Trigeminal) dysfunction Lingual ROM: (pt would not protrude tongue)   Ice Chips Ice chips: Within functional limits Presentation: Spoon   Thin Liquid Thin Liquid: Impaired Presentation: Cup;Self Fed Oral Phase Impairments: Reduced labial seal Oral Phase Functional Implications: Right anterior spillage Pharyngeal  Phase Impairments: Cough - Immediate    Nectar Thick Nectar Thick Liquid: Impaired Presentation: Cup;Spoon Pharyngeal Phase Impairments: Cough - Immediate   Honey Thick Honey Thick Liquid: Not tested   Puree Puree: Not tested   Solid           Lee Ditty, MA CCC-SLP  Acute Rehabilitation Services Pager [phone] Office [phone]  Lee Hooper 08/10/2019,3:03 PM

## 2019-08-10 NOTE — Plan of Care (Signed)

## 2019-08-10 NOTE — Progress Notes (Signed)
Physical Therapy Treatment Patient Details Name: Lee Hooper MRN: [id number] DOB: 1970/07/05 Today's Date: 08/10/2019    History of Present Illness This 49 y.o. male admitted 4/28 with Rt sided hemiplegia and aphasia.  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  Repeat CT following procedure showed moderate basal ganglia hemorrhage vs. stroke   Repeat MRI revealed that the ICA and MCA re-occluded with change in FLAIR sequence excluding him from repeat thrombectomy.  He underwent a decompressive hemicraniectomy.  He waws intubated 4/28.  He developed an ileus 5/2. Trach 5/8. IVC filter placed 5/9.  Peg placement.  PMH includes: COPD, tobacco abuse, psoriasis    PT Comments    Patient alert looking around rt side of room as therapist, RN, and tech were moving about. Rolled with +1 mod assist to his rt using LUE on rt rail to assist. As coming to sitting from rt sidelying, pt reaches with LUE toward foot board. Able to balance with minguard assist while LUE holding footboard, however fatigued (?disinterested as VSS) and attempting to lie back down into bed (which he completed with moderate assist. Pt would not attempt standing this session. If continues to be difficult to work on standing, will consider Kreg full tilt bed.    Follow Up Recommendations  SNF     Equipment Recommendations  Wheelchair (measurements PT);Wheelchair cushion (measurements PT);Hospital bed    Recommendations for Other Services       Precautions / Restrictions Precautions Precautions: Fall Precaution Comments: R hemi, trach, PEG, crani-No Bone flap L head; rt shoulder pain/subluxation Required Braces or Orthoses: (asked nsg to order Prevalon for R foot) Restrictions Weight Bearing Restrictions: No    Mobility  Bed Mobility Overal bed mobility: Needs Assistance Bed Mobility: Rolling;Sidelying to Sit;Sit to Sidelying Rolling: Mod assist(to his rt; pt using rail, assist to flex LLE) Sidelying to  sit: Max assist;+2 for physical assistance;HOB elevated(pt took LLE off EOB (assist for RLE), then HOB elevated)     Sit to sidelying: Mod assist;+2 for safety/equipment(pt trying to lie down and when allowed, pulled LLE up on bed) General bed mobility comments: multimodal cues for sequening and hand over hand assist to reach with L UE to R side; assist with bed pad to bring hips/bilat LE to EOB and then to elevate trunk into sitting; R shoulder supported while returning to side lying and bilat LE assisted into bed with pt attempting to elevate L LE   Transfers                 General transfer comment: pt refused to attempt (instead trying to lie back down in bed)  Ambulation/Gait                 Stairs             Wheelchair Mobility    Modified Rankin (Stroke Patients Only) Modified Rankin (Stroke Patients Only) Pre-Morbid Rankin Score: No symptoms Modified Rankin: Severe disability     Balance Overall balance assessment: Needs assistance Sitting-balance support: Feet supported;Single extremity supported Sitting balance-Leahy Scale: Poor Sitting balance - Comments: pt reaching with LUE for foot board (mod assist); once holding footboard, min guard assist Postural control: Right lateral lean                                  Cognition Arousal/Alertness: Awake/alert Behavior During Therapy: Flat affect Overall Cognitive Status: Impaired/Different  from baseline Area of Impairment: Attention;Following commands;Safety/judgement;Problem solving;Awareness                   Current Attention Level: Sustained   Following Commands: Follows one step commands inconsistently;Follows one step commands with increased time Safety/Judgement: Decreased awareness of safety;Decreased awareness of deficits Awareness: Intellectual Problem Solving: Slow processing;Decreased initiation;Difficulty sequencing;Requires verbal cues;Requires tactile  cues General Comments: Requiring increased time to follow cues and commands      Exercises      General Comments General comments (skin integrity, edema, etc.): pt lightly holding to footboard with LUE, however as soon as asked to put left hand on his left knee, he began leaning to his rt; was able to help correct with mod assist as he began reaching for footboard (indicating awareness that he was leaning to his rt and do not think this is pusher syndrome).       Pertinent Vitals/Pain Pain Assessment: Faces Faces Pain Scale: Hurts whole lot Pain Location: grimacing during bed mobility (especially rolling rt--cannot roll left) and while coughing Pain Descriptors / Indicators: Grimacing Pain Intervention(s): Limited activity within patient's tolerance;Monitored during session;Repositioned    Home Living                      Prior Function            PT Goals (current goals can now be found in the care plan section) Acute Rehab PT Goals Patient Stated Goal: none stated by patient PT Goal Formulation: Patient unable to participate in goal setting Time For Goal Achievement: 08/24/19 Potential to Achieve Goals: Fair Progress towards PT goals: Progressing toward goals(updated based on timeframe)    Frequency    Min 2X/week      PT Plan Current plan remains appropriate    Co-evaluation              AM-PAC PT "6 Clicks" Mobility   Outcome Measure  Help needed turning from your back to your side while in a flat bed without using bedrails?: A Lot Help needed moving from lying on your back to sitting on the side of a flat bed without using bedrails?: Total Help needed moving to and from a bed to a chair (including a wheelchair)?: Total Help needed standing up from a chair using your arms (e.g., wheelchair or bedside chair)?: Total Help needed to walk in hospital room?: Total Help needed climbing 3-5 steps with a railing? : Total 6 Click Score: 7    End of  Session Equipment Utilized During Treatment: Oxygen(28% trach collar; VSS) Activity Tolerance: Patient tolerated treatment well Patient left: in bed;with call bell/phone within reach;with bed alarm set Nurse Communication: Mobility status;Need for lift equipment PT Visit Diagnosis: Other abnormalities of gait and mobility (R26.89);Hemiplegia and hemiparesis Hemiplegia - Right/Left: Right Hemiplegia - dominant/non-dominant: Dominant Hemiplegia - caused by: Nontraumatic intracerebral hemorrhage     Time: 1510-1534 PT Time Calculation (min) (ACUTE ONLY): 24 min  Charges:  $Therapeutic Activity: 23-37 mins                      Arby Barrette, PT Pager [phone]    Rexanne Mano 08/10/2019, 6:12 PM

## 2019-08-10 NOTE — Progress Notes (Signed)
  Speech Language Pathology Treatment: [place] Speaking valve  Patient Details Name: Lee Hooper MRN: [id number] DOB: 1970/05/13 Today's Date: 08/10/2019 Time: 1325-1405 SLP Time Calculation (min) (ACUTE ONLY): 40 min  Assessment / Plan / Recommendation Clinical Impression  Pt again demonstrates excellent tolerance and benefit of PMSV today. Pt wore valve for over 30 minutes with no supplemental O2 with stable baseline O2 sats at 97-95. Pt was able to sense redirected air and started moving his mouth, swallowing and experimenting with articulation and phonation. During swallowing tasks PMSV allowed pt to effectively cough to protect airway. Recommend PMSV during all waking hours and therapies. Discussed with RN. See next notes addressing language and swallowing.   HPI HPI: This 49 y.o. male admitted 4/28 with Rt sided hemiplegia and aphasia.  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  Repeat CT following procedure showed moderate basal ganglia hemorrhage vs. stroke   Repeat MRI revealed that the ICA and MCA re-occluded with change in FLAIR sequence excluding him from repeat thrombectomy.  He underwent a decompressive hemicraniectomy.  He waws intubated 4/28.  He developed an ileus 5/2. Trach 5/8. IVC filter placed 5/9.  Peg placement.  PMH includes: COPD, tobacco abuse, psoriasis      SLP Plan  Continue with current plan of care       Recommendations         Patient may use Passy-Muir Speech Valve: During all waking hours (remove during sleep);During all therapies with supervision         Oral Care Recommendations: Oral care BID Follow up Recommendations: Inpatient Rehab SLP Visit Diagnosis: Aphonia (R49.1) Plan: Continue with current plan of care       GO               Harlon Ditty, MA CCC-SLP  Acute Rehabilitation Services Pager [phone] Office [phone]  Claudine Mouton 08/10/2019, 2:37 PM

## 2019-08-11 LAB — GLUCOSE, CAPILLARY
Glucose-Capillary: 100 mg/dL — ABNORMAL HIGH (ref 70–99)
Glucose-Capillary: 102 mg/dL — ABNORMAL HIGH (ref 70–99)
Glucose-Capillary: 104 mg/dL — ABNORMAL HIGH (ref 70–99)
Glucose-Capillary: 177 mg/dL — ABNORMAL HIGH (ref 70–99)
Glucose-Capillary: 150 mg/dL — ABNORMAL HIGH (ref 70–99)
Glucose-Capillary: 80 mg/dL (ref 70–99)
Glucose-Capillary: 164 mg/dL — ABNORMAL HIGH (ref 70–99)

## 2019-08-11 LAB — TROPONIN I (HIGH SENSITIVITY): Troponin I (High Sensitivity): <2 ng/L (ref <18)

## 2019-08-11 NOTE — Progress Notes (Signed)
Nutrition Follow-up  DOCUMENTATION CODES:   Obesity unspecified  INTERVENTION:  Continuebolus feeding via PEG: -2 cans Osmolite 1.5 cal (465m) TID  -364mPro-stat TID -15044mree water Q4H  Tube feeding regimen will provide 2430 kcals, 134 grams of protein, 1086m79mee water (1986ml79mal free water with flushes)   Continue 1 packet Juven BIDvia PEG, each packet provides 95 calories, 2.5 grams of protein (collagen), and 9.8 grams of carbohydrate (3 grams sugar); also contains 7 grams of L-arginine and L-glutamine, 300 mg vitamin C, 15 mg vitamin E, 1.2 mcg vitamin B-12, 9.5 mg zinc, 200 mg calcium, and 1.5 g Calcium Beta-hydroxy-Beta-methylbutyrate to support wound healing   NUTRITION DIAGNOSIS:   Inadequate oral intake related to inability to eat as evidenced by NPO status.  Ongoing.  GOAL:   Patient will meet greater than or equal to 90% of their needs  Met with TF.   MONITOR:   TF tolerance  REASON FOR ASSESSMENT:   Consult, Ventilator Enteral/tube feeding initiation and management  ASSESSMENT:   Pt with PMH of smoking and COPD admitted with L ICA/MCA s/p IR for revascularization however pt had re-occlusion now s/p emergent L hemicraniectomy.  4/30 cortrak placed; tip gastric 5/2 TF held due to ileus 5/6 trickle TF started 5/8 trach placed; IVC filter placed 5/14 PEG placed 6/2 - s/p scalp wound revision  Discussed pt with RN.   Current TF via PEG: 474ml 61mlite 1.5 cal TID, 30ml P94mtat TID, 150ml fr89mater Q4H, 1 packet Juven BID  UOP: 2,025ml x2477mrs I/O: -6,330.5ml since16mmit  Labs reviewed. CBGs: 100-149 Medications: Imodium, liquid MVI  Diet Order:   Diet Order            Diet NPO time specified  Diet effective midnight              EDUCATION NEEDS:   No education needs have been identified at this time  Skin:  Skin Assessment: Skin Integrity Issues: Skin Integrity Issues:: Stage III, Incisions Stage III: R  neck Incisions: head x2, R neck  Last BM:  6/6  Height:   Ht Readings from Last 1 Encounters:  07/22/19 6' (1.829 m)    Weight:   Wt Readings from Last 1 Encounters:  08/10/19 94.6 kg    Ideal Body Weight:  80.9 kg  BMI:  Body mass index is 28.29 kg/m.  Estimated Nutritional Needs:   Kcal:  2300-2500 [id number]  125-145 grams  Fluid:  2 L/day   Beatrix Breece AveLarkin InaLDN RD pager number and weekend/on-call pager number located in [email or web address]

## 2019-08-11 NOTE — Progress Notes (Signed)
  Speech Language Pathology Treatment: Dysphagia;Cognitive-Linquistic;Passy Muir Speaking valve  Patient Details Name: Lee Hooper MRN: [id number] DOB: 12-Oct-1970 Today's Date: 08/11/2019 Time: 1030-1050 SLP Time Calculation (min) (ACUTE ONLY): 20 min  Assessment / Plan / Recommendation Clinical Impression  Pt able to self feed ice x1 with tactile verbal and visual cues for initiation. Pt with 5 second intervals of sustained attention. With PMV in place he had one spontaneous utterance when trying to turn off the TV, and then with cueing, attempted to articulate with counting activity with successful instances of phonation. Pt is making subtle gains. Will continue efforts.   HPI HPI: This 49 y.o. male admitted 4/28 with Rt sided hemiplegia and aphasia.  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  Repeat CT following procedure showed moderate basal ganglia hemorrhage vs. stroke   Repeat MRI revealed that the ICA and MCA re-occluded with change in FLAIR sequence excluding him from repeat thrombectomy.  He underwent a decompressive hemicraniectomy.  He waws intubated 4/28.  He developed an ileus 5/2. Trach 5/8. IVC filter placed 5/9.  Peg placement.  PMH includes: COPD, tobacco abuse, psoriasis      SLP Plan  Continue with current plan of care       Recommendations  Diet recommendations: NPO                Oral Care Recommendations: Oral care BID Follow up Recommendations: Inpatient Rehab SLP Visit Diagnosis: Dysphagia, oropharyngeal phase (R13.12) Plan: Continue with current plan of care       GO               Harlon Ditty, MA CCC-SLP  Acute Rehabilitation Services Pager [phone] Office [phone]  Claudine Mouton 08/11/2019, 11:49 AM

## 2019-08-11 NOTE — Progress Notes (Signed)
Patient ID: Lee Hooper, male   DOB: 1970/09/15, 49 y.o.   MRN: [id number] BP 116/69 (BP Location: Left Arm)   Pulse 97   Temp 98.9 F (37.2 C) (Axillary)   Resp (!) 23   Ht 6' (1.829 m)   Wt 94.6 kg   SpO2 97%   BMI 28.29 kg/m  Following some commands Wound remains dry No changes noted neurologically

## 2019-08-11 NOTE — Progress Notes (Signed)
STROKE TEAM PROGRESS NOTE   INTERVAL HISTORY  Patient is awake arousable follows simple commands on the left.  Telemetry monitoring showed ST elevation in inferior leads however upon review of twelve-lead EKG I was not impressed.  Troponin level is not elevated.  Patient does not appear to be uncomfortable  Vitals:   08/11/19 1216 08/11/19 1217 08/11/19 1553 08/11/19 1612  BP: 106/78 106/78 113/80 116/69  Pulse:  (!) 121  (!) 102  Resp: (!) 25   (!) 23  Temp:  97.6 F (36.4 C)  98.6 F (37 C)  TempSrc:  Oral  Axillary  SpO2:    97%  Weight:      Height:       CBC:  Recent Labs  Lab 08/07/19 0516 08/10/19 0332  WBC 5.0 4.0  HGB 12.3* 12.9*  HCT 39.8 40.5  MCV 96.1 96.7  PLT 263 644   Basic Metabolic Panel:  Recent Labs  Lab 08/07/19 0516 08/10/19 0332  NA 137 136  K 4.0 4.1  CL 98 97*  CO2 31 29  GLUCOSE 95 99  BUN 17 20  CREATININE 0.68 0.72  CALCIUM 8.8* 9.0    IMAGING past 24 hours No results found.   PHYSICAL EXAM    General- mildly obese middle-aged Caucasian male, s/p tracheostomy on trach collar  Ophthalmologic- fundi not visualized due to noncooperation.  Cardiovascular - Regular rate and rhythm.  Neuro - s/p tracheostomy on trach collar. Initially sleepy but easily arousable, global aphasia, left gaze preference position, barely cross midline, notblinking to visual threat on the left, PERRL. Right facial droop. Tongue protrusion not cooperative. LUE spontaneous movement at least 4/5, LLE against gravity at least 3/5, right UE and LE flaccid, slight 1/5 movement with pain at RLE. DTR 1+ and no babinski. Sensation, coordination and gait not tested.    ASSESSMENT/PLAN Lee Hooper is a 49 y.o. male with history of COPD, ongoing tobacco use, and psoriasis, presenting with dense LMCA syndrome. Attempt for Left ICA and Left M1 thrombectomy was done emergently with TICI 2b flow. Unfortunately, the pt had post procedure hemorrhage and  reocclusion of the vessels. F/u imaging suggested early edema and pt underwent prophylactic decompressive hemicraniectomy.  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 Stroke etiology unknown - given DVT requiring eventual AC and high mRS, will not do further embolic workup  Eliquis for VTE prophylaxis  No antithrombotic prior to admission, now on Eliquis.  Therapy recommendations:  SNF   Disposition:  Pending - LTACH consulted however no insurance - await bed  Respiratory failure s/p Trach Trach wound  Downsized to cuffless trach 6 by CCM 5/24  On trach collar  Bilateral Lower Ext DVT, extensive Possible PE given tachycardia  Venous US 07/11/2019 DVT + bilateral extensive  IVC filter placed 5/9  On eliquis now for DVT prophylaxis  Dysphagia   NPO  PEG placement 5/14  FW 150 Q4h   On bolus TF  Tobacco abuse  Current smoker  Smoking cessation counseling will be provided if able  Other Stroke Risk Factors  Obesity, Body mass index is 28.29 kg/m., recommend weight loss, diet and exercise as appropriate    Likely undiagnosed obstructive sleep apnea based on body habitus  Other Active Problems  Elevated LFT - AST/ALT 54/102, improved    Constipation. Miralax daily -> loose stool -> miralax discontinued -> add imodium   Mild anemia - Hb - 12.3  Concern for elevated ST 6/8 on tele. EKG ok. Troponin neg. Will d/c tele. Pt not a candidate for cardiac intervention. On AC.  Hospital day # 35   Continue present treatment.  No changes Delia Heady, MD Stroke Neurology 08/11/2019 4:52 PM   To contact Stroke Continuity provider, please refer to [email or web address]. After hours, contact General Neurology

## 2019-08-11 NOTE — Plan of Care (Signed)
  Problem: Education: Goal: Knowledge of disease or condition will improve Outcome: Progressing Goal: Knowledge of secondary prevention will improve Outcome: Progressing Goal: Knowledge of patient specific risk factors addressed and post discharge goals established will improve Outcome: Progressing Goal: Individualized Educational Video(s) Outcome: Progressing   

## 2019-08-11 NOTE — Hospital Course (Addendum)
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 Stroke etiology unknown - given DVT requiring eventual AC and high mRS, will not do further embolic workup Code Stroke CT head showed early hypodensity in nearly entire LMCA. ASPECTS 6-7 per notes. CTA head & neck occlusion cervical ICA on the left. This is most likely due to dissection. The patient has minimal atherosclerotic disease.  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  There is poor collateral circulation on the left.  CTP CBF large core volume of 266 mL, perfusion volume 313 mL, mismatch volume 47 mL.  ASPECTS using CTP by Dr. Carlis Abbott 6 or 7.  IR - TICI2c revascularization MRI  Restricted diffusion throughout the majority of the left MCA and ACA vascular territories consistent with acute ischemia. MRA - reocclusion of the intracranial left internal carotid artery. No flow related signal is seen within the left middle cerebral artery. CT Head 07/04/19 - Slightly increased rightward midline shift with a small amount of herniation beneath the anterior falx. Otherwise unchanged examination. CT repeat 5/6 stable infarcts and SAH but increasing edema w/ midline shift now 69mm and herniation through crani site CT repeat 5/21 decreased shift 5->8mm. Extensive L crani w/ buldging through defect. Still L ACA and MCA low density w/ petechial hemorrhage but no large hemorrhage.  2D Echo - EF 55 - 60%. No cardiac source of emboli identified.  LDL 45 HgbA1c 5.5 Eliquis for VTE prophylaxis No antithrombotic prior to admission, now on Eliquis. Therapy recommendations:  SNF  Disposition:  Pending - LTACH consulted however no insurance   Respiratory failure s/p Trach Trach wound, resolved  Intubated on vent CCM on board CXR 07/03/19- New consolidation in the right lower lobe  and left infrahilar region concerning for pneumonia. Possible small layering right effusion. CXR 5/3 mild pulm edema w/ atx vs infiltrate/aspiration On cefepime 4/30>>5/2 for pneumonia Augmentin 5/2>>5/4 Unasyn 5/5>>5/6 Trach placed 5/8 per CCM WOC consulted for trach wound ->progressed to stage 3 pressure, for dressing change q3d and prn Fever and elevated white count and tachycardia hence restarted on Vanc & Cefepime 5/11>>5/13 Ancef 5/14>>5/17  (pneumonia) On trach collar since 5/14 w/ high FiO2 requirements Downsized to cuffless trach 6 by CCM 5/24 On trach collar  Cerebral edema, resolved Hypernatremia, resolved MRI - Restricted diffusion throughout the majority of the left MCA and ACA vascular territories consistent with acute ischemia. CT Head 07/04/19 - Slightly increased rightward midline shift with a small amount of herniation beneath the anterior falx. Otherwise unchanged examination. S/p decompressive left hemicraniectomy  4/28 Dr Christella Noa CT 5/6 - stable infarcts and SAH but increasing edema w/ midline shift now 21mm and herniation through crani site CT head 5/20 - decreased shift 5->41mm. Extensive L crani w/ buldging through defect. Still L ACA and MCA low density w/ petechial hemorrhage but no large hemorrhage. completed keppra 2 wk course per NSG Off 3% and D5NS Okay to allow Na gradually normalize Na normalized  Replace flap prior to d/c to SNF. Reach out to Dr. Christella Noa to place. Likely need to do after 2 mos.  Sepsis, resolved Fever w/ leukocytosis, resolved HCAP, resolved Hypotension, resolved TMax 100.2->100.3->afebrile Leukocytosis, resolved  UA neg Sputum moraxella, H. flu  On cefepime 4/30>>5/2; Augmentin 5/2>>5/4, Unasyn 5/5>>5/6 -> off Blood culture 5/2 no growth Blood cultures 5/7 - no growth  Resp cultures 5/7 - no growth CXR 5/9 - streaky BLL opacilites atx/consolication/PNA. Possible R pleural effusion CXR 5/10 w/ atx vs infiltrate CXR 07/15/19 - Stable  bibasilar atelectasis a small right pleural effusion. No new opacity evident. Stable cardiac silhouette Sputum cx 5/11 reincubated  Blood Cx 5/11 no growth  Vanc 5/11>>5/13, Cefepime 5/11>>5/13 Ancef 5/14>>5/17  (pneumonia) CXR 5/18 slight improvement in bibasilar atx CXR 5/24 increased vol loss RLL w/ streaky opacities, worsening atx, ? PNA. Minor LLL atx. Vanc 5/24>>off Cefepime 5/24>>5/30 UA WNL CXR 07/27/19 - Increased volume loss at the right lung base with streaky opacities, favor worsening atelectasis. Pneumonia is also considered in the setting of elevated temperature.  Bilateral Lower Ext DVT, extensive Possible PE given tachycardia Venous US 07/11/2019 DVT + bilateral extensive IVC filter placed 5/9 On eliquis now for DVT treatment and prophylaxis  Hypertension Home meds:  None Current meds: none scheduled SBP goal < 160 due to hemorrhagic conversion Long-term BP goal normotensive  Hyperglycemia on TF Added SSI  Dysphagia  NPO Coretrak 5/7 PEG placement 5/14 FW 150 Q4h  On bolus TF  Ileus, resolved Abd distension w/ decreased BS KUB 5/2 distended colonic loop Miralax  Flexiseal following laxatives On reglan, laxatives on hold, decrease fentanyl Coretrak 5/7 PEG 5/14 TF resumed after PEG  CSF leakage, resolved Crani sutures out -> suture incision leaking 5/16 - s/p local suture Again From left scalp incision site 6/1 NSG on board ([place]) S/p wound repair with Dr. Franky Macho 6/2  Acute Renal Failure, resolved Renal US - increased cortical echogenicity in B kidneys c/w renal dz. Increased echogenicity of liver ?hepatic steatosis. Urine studies - elevated protein creatinine ratio, Na normal, UN normal Creatinine - 0.68  Diuresis - per CCM in ICU  Dysphagia  Secondary to stroke PEG placement 5/14 FW 150 Q4h  On bolus TF MBSS cleared for D1 nectar thick liquids SLP following for tolerance  Tobacco abuse Current smoker Smoking cessation counseling  will be provided if able  Other Stroke Risk Factors Obesity, Body mass index is 28.29 kg/m., recommend weight loss, diet and exercise as appropriate   Likely undiagnosed obstructive sleep apnea based on body habitus  Other Active Problems Hypokalemia - resolved - 4.0 Thrombocytopenia - resolved Elevated LFT - AST/ALT 54/102, improved   Constipation. Miralax daily -> loose stool -> miralax discontinued -> add imodium  Charm trial for cytotoxic edema Mild anemia - stable Mild hyponatremia - resolved Concern for elevated ST 6/8 on tele. EKG ok. Troponin neg. Will d/c tele. Pt not a candidate for cardiac intervention. On AC. Constipation. Miralax daily -> loose stool -> miralax discontinued -> add imodium -> d/c imodium

## 2019-08-11 NOTE — Progress Notes (Signed)
Occupational Therapy Treatment Patient Details Name: Lee Hooper MRN: [id number] DOB: Feb 01, 1971 Today's Date: 08/11/2019    History of present illness This 49 y.o. male admitted 4/28 with Rt sided hemiplegia and aphasia.  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  Repeat CT following procedure showed moderate basal ganglia hemorrhage vs. stroke   Repeat MRI revealed that the ICA and MCA re-occluded with change in FLAIR sequence excluding him from repeat thrombectomy.  He underwent a decompressive hemicraniectomy.  He waws intubated 4/28.  He developed an ileus 5/2. Trach 5/8. IVC filter placed 5/9.  Peg placement.  PMH includes: COPD, tobacco abuse, psoriasis   OT comments  Pt making steady progress towards OT goals this session. Pt continues to require +2 assist for bed mobility but able to roll to R side of bed with MOD A using LUE to reach to rail, MAX A +2 to elevate trunk into sitting. Pt able to sit EOB ~ 6 mins with MIN guard- Max A for sitting balance; pt able to use LUE on end of bed rail to maintain sitting balance, however when pt attempts to return LUE to lap pt immediately leans R needing MAX A to correct and maintain sitting balance. Pt continues to be flat during session despite having PMV donned. Pt did give therapist thumbs up at end of session. Agree with DC plan below, will follow acutely per POC.     Follow Up Recommendations  SNF;Supervision/Assistance - 24 hour    Equipment Recommendations  Other (comment)(defer to next venue of care)    Recommendations for Other Services      Precautions / Restrictions Precautions Precautions: Fall Precaution Comments: R hemi, trach, PEG, crani-No Bone flap L head; rt shoulder pain/subluxation Restrictions Weight Bearing Restrictions: No       Mobility Bed Mobility Overal bed mobility: Needs Assistance Bed Mobility: Rolling;Sidelying to Sit;Sit to Sidelying Rolling: Mod assist(to pts R side with pt reaching  with LUE to bed rail; needs tactile cue to locate rail) Sidelying to sit: Max assist;+2 for physical assistance;HOB elevated     Sit to sidelying: Mod assist;+2 for safety/equipment General bed mobility comments: pt able to initiate rolling to R side with LUE with elevated HOB; pt able to use LUE to push into rail to elevate trunk but ultimately did requrie MAX A +2 to elevate trunk. pt laying himself down needing MOD A to return to sidelying  Transfers                 General transfer comment: defer for safety    Balance Overall balance assessment: Needs assistance Sitting-balance support: Feet supported;Single extremity supported Sitting balance-Leahy Scale: Poor Sitting balance - Comments: pt able to use LUE on end of bed rail to maintain sitting balance with close minguard, however MAX A for sitting balance with no UE support Postural control: Right lateral lean                                 ADL either performed or assessed with clinical judgement   ADL Overall ADL's : Needs assistance/impaired     Grooming: Wash/dry face;Sitting;Total assistance Grooming Details (indicate cue type and reason): pt unable to wash face without total A this session; heavily reliant on using LUE for sitting balance on end of bed             Lower Body Dressing: Total assistance;Bed  level Lower Body Dressing Details (indicate cue type and reason): to don socks   Toilet Transfer Details (indicate cue type and reason): defer d/t safety         Functional mobility during ADLs: Maximal assistance;+2 for physical assistance(bed mobility only) General ADL Comments: pt unable to follow commands to engage in ADLS this session needing total A to wash face EOB; MAX A +2 to transition EOB exiting to pts R side. pt able to use LUE to maintain sitting balance but requried MAX A for sitting balance when attempting to being BUEs to lap     Vision       Perception     Praxis       Cognition Arousal/Alertness: Awake/alert Behavior During Therapy: Flat affect Overall Cognitive Status: Impaired/Different from baseline Area of Impairment: Attention;Following commands;Safety/judgement;Problem solving;Awareness                   Current Attention Level: Sustained   Following Commands: Follows one step commands inconsistently Safety/Judgement: Decreased awareness of safety;Decreased awareness of deficits Awareness: Intellectual Problem Solving: Slow processing;Decreased initiation;Difficulty sequencing;Requires verbal cues;Requires tactile cues General Comments: not following commands this session; did give OTA a thumbs up when therapist stated " I'm proud of you"        Exercises     Shoulder Instructions       General Comments pt with PMV donned however unable to communicate with therapist    Pertinent Vitals/ Pain       Pain Assessment: Faces Faces Pain Scale: Hurts little more Pain Location: while coughing and during bed mobility Pain Descriptors / Indicators: Grimacing;Discomfort Pain Intervention(s): Limited activity within patient's tolerance;Monitored during session;Repositioned  Home Living                                          Prior Functioning/Environment              Frequency  Min 2X/week        Progress Toward Goals  OT Goals(current goals can now be found in the care plan section)  Progress towards OT goals: Progressing toward goals  Acute Rehab OT Goals Patient Stated Goal: none stated by patient OT Goal Formulation: Patient unable to participate in goal setting Time For Goal Achievement: 08/18/19 Potential to Achieve Goals: Fair  Plan Discharge plan remains appropriate    Co-evaluation                 AM-PAC OT "6 Clicks" Daily Activity     Outcome Measure   Help from another person eating meals?: Total Help from another person taking care of personal grooming?: Total Help  from another person toileting, which includes using toliet, bedpan, or urinal?: Total Help from another person bathing (including washing, rinsing, drying)?: Total Help from another person to put on and taking off regular upper body clothing?: Total Help from another person to put on and taking off regular lower body clothing?: Total 6 Click Score: 6    End of Session Equipment Utilized During Treatment: Other (comment)(trach collar with PMW; 28% FiO2 5LPM)  OT Visit Diagnosis: Unsteadiness on feet (R26.81);Cognitive communication deficit (R41.841);Hemiplegia and hemiparesis;Muscle weakness (generalized) (M62.81);Apraxia (R48.2);Other symptoms and signs involving cognitive function;Pain Symptoms and signs involving cognitive functions: Cerebral infarction Hemiplegia - Right/Left: Right Hemiplegia - dominant/non-dominant: Dominant Hemiplegia - caused by: Cerebral infarction Pain - Right/Left: Right  Activity Tolerance Patient tolerated treatment well   Patient Left in bed;with call bell/phone within reach;with bed alarm set   Nurse Communication          Time: 2774-1287 OT Time Calculation (min): 20 min  Charges: OT General Charges $OT Visit: 1 Visit OT Treatments $Therapeutic Activity: 8-22 mins  Audery Amel., COTA/L Acute Rehabilitation Services [phone]    Angelina Pih 08/11/2019, 11:35 AM

## 2019-08-11 NOTE — Progress Notes (Signed)
Sent text to sharon Biby to let her know tele called and said pt @1153  ST elevation in lead 2 (2.0)  AVF 1.7.

## 2019-08-12 ENCOUNTER — Encounter (HOSPITAL_COMMUNITY): Payer: Self-pay | Admitting: Neurosurgery

## 2019-08-12 LAB — GLUCOSE, CAPILLARY
Glucose-Capillary: 96 mg/dL (ref 70–99)
Glucose-Capillary: 103 mg/dL — ABNORMAL HIGH (ref 70–99)
Glucose-Capillary: 167 mg/dL — ABNORMAL HIGH (ref 70–99)
Glucose-Capillary: 113 mg/dL — ABNORMAL HIGH (ref 70–99)
Glucose-Capillary: 125 mg/dL — ABNORMAL HIGH (ref 70–99)

## 2019-08-12 LAB — BASIC METABOLIC PANEL
Anion gap: 9 (ref 5–15)
BUN: 17 mg/dL (ref 6–20)
CO2: 31 mmol/L (ref 22–32)
Calcium: 9.2 mg/dL (ref 8.9–10.3)
Chloride: 98 mmol/L (ref 98–111)
Creatinine, Ser: 0.65 mg/dL (ref 0.61–1.24)
GFR calc Af Amer: >60 mL/min (ref >60)
GFR calc non Af Amer: >60 mL/min (ref >60)
Glucose, Bld: 99 mg/dL (ref 70–99)
Potassium: 4.4 mmol/L (ref 3.5–5.1)
Sodium: 138 mmol/L (ref 135–145)

## 2019-08-12 LAB — CBC
HCT: 41.4 % (ref 39.0–52.0)
Hemoglobin: 13.2 g/dL (ref 13.0–17.0)
MCH: 30.3 pg (ref 26.0–34.0)
MCHC: 31.9 g/dL (ref 30.0–36.0)
MCV: 95 fL (ref 80.0–100.0)
Platelets: 247 10*3/uL (ref 150–400)
RBC: 4.36 MIL/uL (ref 4.22–5.81)
RDW: 17.1 % — ABNORMAL HIGH (ref 11.5–15.5)
WBC: 4.8 10*3/uL (ref 4.0–10.5)
nRBC: 0 % (ref 0.0–0.2)

## 2019-08-12 NOTE — Progress Notes (Addendum)
INTERVAL HISTORY  No changes.  Vital signs stable.  Patient is awake arousable follows simple commands on the left.   Yet awaiting skilled nursing bed.  Vitals:   08/12/19 0833 08/12/19 1110 08/12/19 1216 08/12/19 1440  BP: 109/73 109/73  109/69  Pulse: 84 (!) 102  87  Resp: (!) 21 20  (!) 24  Temp:   98.2 F (36.8 C)   TempSrc:   Axillary   SpO2: 96% 94%  95%  Weight:      Height:       CBC:  Recent Labs  Lab 08/10/19 0332 08/12/19 0422  WBC 4.0 4.8  HGB 12.9* 13.2  HCT 40.5 41.4  MCV 96.7 95.0  PLT 255 836   Basic Metabolic Panel:  Recent Labs  Lab 08/10/19 0332 08/12/19 0422  NA 136 138  K 4.1 4.4  CL 97* 98  CO2 29 31  GLUCOSE 99 99  BUN 20 17  CREATININE 0.72 0.65  CALCIUM 9.0 9.2    IMAGING past 24 hours No results found.   PHYSICAL EXAM    General- mildly obese middle-aged Caucasian male, s/p tracheostomy on trach collar  Ophthalmologic- fundi not visualized due to noncooperation.  Cardiovascular - Regular rate and rhythm.  Neuro - s/p tracheostomy on trach collar. Initially sleepy but easily arousable, global aphasia, left gaze preference position, barely cross midline, notblinking to visual threat on the left, PERRL. Right facial droop. Tongue protrusion not cooperative. LUE spontaneous movement at least 4/5, LLE against gravity at least 3/5, right UE and LE flaccid, slight 1/5 movement with pain at RLE. DTR 1+ and no babinski. Sensation, coordination and gait not tested.    ASSESSMENT/PLAN [name] with history of COPD, ongoing tobacco use, and psoriasis, presenting with dense LMCA syndrome. Attempt for Left ICA and Left M1 thrombectomy was done emergently with TICI 2b flow. Unfortunately, the pt had post procedure hemorrhage and reocclusion of the vessels. F/u imaging suggested early edema and pt underwent prophylactic decompressive hemicraniectomy.  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 Stroke etiology unknown - given DVT requiring eventual AC and high mRS, will not do further embolic workup  Eliquis for VTE prophylaxis  No antithrombotic prior to admission, now on Eliquis.  Therapy recommendations:  SNF   Disposition:  Pending - LTACH consulted however no insurance - await bed  Replace flap prior to d/c to SNF. Reach out to Dr. Christella Noa to replace once bed located. Likely need to wait until after 2 mos.  Respiratory failure s/p Trach Trach wound  Downsized to cuffless trach 6 by CCM 5/24  On trach collar  Bilateral Lower Ext DVT, extensive Possible PE given tachycardia  Venous US 07/11/2019 DVT + bilateral extensive  IVC filter placed 5/9  On eliquis now for DVT prophylaxis  Dysphagia   NPO  PEG placement 5/14  FW 150 Q4h   On bolus TF  Tobacco abuse  Current smoker  Smoking cessation counseling will be provided if able  Other Stroke Risk Factors  Obesity, Body mass index is 28.49 kg/m., recommend weight loss, diet and exercise as appropriate    Likely undiagnosed obstructive sleep apnea based on body habitus  Other Active  Problems  Elevated LFT - AST/ALT 54/102, improved    Constipation. Miralax daily -> loose stool -> miralax discontinued -> add imodium   Mild anemia - Hb - 12.3  Concern for elevated ST 6/8 on tele. EKG ok. Troponin neg. Will d/c tele. Pt not a candidate for cardiac intervention. On AC.  Hospital day # 56   Continue present treatment.  No changes.  Yet awaiting skilled nursing facility bed but due to no insurance this will be difficult to find Lee Heady, MD Stroke Neurology 08/12/2019 3:00 PM   To contact Stroke Continuity provider, please refer to [email or web address]. After hours,  contact General Neurology

## 2019-08-13 LAB — GLUCOSE, CAPILLARY
Glucose-Capillary: 108 mg/dL — ABNORMAL HIGH (ref 70–99)
Glucose-Capillary: 112 mg/dL — ABNORMAL HIGH (ref 70–99)
Glucose-Capillary: 143 mg/dL — ABNORMAL HIGH (ref 70–99)
Glucose-Capillary: 152 mg/dL — ABNORMAL HIGH (ref 70–99)
Glucose-Capillary: 152 mg/dL — ABNORMAL HIGH (ref 70–99)
Glucose-Capillary: 201 mg/dL — ABNORMAL HIGH (ref 70–99)
Glucose-Capillary: 86 mg/dL (ref 70–99)

## 2019-08-13 NOTE — Progress Notes (Signed)
Patient ID: Lee Hooper, male   DOB: November 23, 1970, 49 y.o.   MRN: [id number] BP 116/84 (BP Location: Right Arm)   Pulse 91   Temp 97.8 F (36.6 C)   Resp 18   Ht 6' (1.829 m)   Wt 94.6 kg   SpO2 94%   BMI 28.29 kg/m  Alert Wound is healing nicely.  Will order planning ct for cranioplasty tomorrow.

## 2019-08-13 NOTE — Progress Notes (Signed)
INTERVAL HISTORY  He is lying comfortably in bed  Patient is awake arousable follows simple commands on the left.   Yet awaiting skilled nursing bed.  Vitals:   08/13/19 0746 08/13/19 0754 08/13/19 1124 08/13/19 1142  BP:  111/80  108/81  Pulse: 85 83 (!) 112 97  Resp: 18 18 20 20   Temp:  98.5 F (36.9 C)  97.7 F (36.5 C)  TempSrc:  Oral    SpO2: 94% 95% 95% 92%  Weight:      Height:       CBC:  Recent Labs  Lab 08/10/19 0332 08/12/19 0422  WBC 4.0 4.8  HGB 12.9* 13.2  HCT 40.5 41.4  MCV 96.7 95.0  PLT 255 390   Basic Metabolic Panel:  Recent Labs  Lab 08/10/19 0332 08/12/19 0422  NA 136 138  K 4.1 4.4  CL 97* 98  CO2 29 31  GLUCOSE 99 99  BUN 20 17  CREATININE 0.72 0.65  CALCIUM 9.0 9.2    IMAGING past 24 hours No results found.   PHYSICAL EXAM    General- mildly obese middle-aged Caucasian male, s/p tracheostomy on trach collar  Ophthalmologic- fundi not visualized due to noncooperation.  Cardiovascular - Regular rate and rhythm.  Neuro - s/p tracheostomy on trach collar. Initially sleepy but easily arousable, global aphasia, left gaze preference position, barely cross midline, notblinking to visual threat on the left, PERRL. Right facial droop. Tongue protrusion not cooperative. LUE spontaneous movement at least 4/5, LLE against gravity at least 3/5, right UE and LE flaccid, slight 1/5 movement with pain at RLE. DTR 1+ and no babinski. Sensation, coordination and gait not tested.    ASSESSMENT/PLAN Lee Hooper is a 49 y.o. male with history of COPD, ongoing tobacco use, and psoriasis, presenting with dense LMCA syndrome. Attempt for Left ICA and Left M1 thrombectomy was done emergently with TICI 2b flow. Unfortunately, the pt had post procedure hemorrhage and reocclusion of the vessels. F/u imaging suggested early edema and pt underwent prophylactic decompressive hemicraniectomy.  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 Stroke etiology unknown - given DVT requiring eventual AC and high mRS, will not do further embolic workup  Eliquis for VTE prophylaxis  No antithrombotic prior to admission, now on Eliquis.  Therapy recommendations:  SNF   Disposition:  Pending - LTACH consulted however no insurance - await bed  Replace flap prior to d/c to SNF. Reach out to Dr. Christella Noa to replace once bed located. Likely need to wait until after 2 mos.  Respiratory failure s/p Trach Trach wound  Downsized to cuffless trach 6 by CCM 5/24  On trach collar  Bilateral Lower Ext DVT, extensive Possible PE given tachycardia  Venous US 07/11/2019 DVT + bilateral extensive  IVC filter placed 5/9  On eliquis now for DVT prophylaxis  Dysphagia   NPO  PEG placement 5/14  FW 150 Q4h   On bolus TF  Tobacco abuse  Current smoker  Smoking cessation counseling will be provided if able  Other Stroke Risk Factors  Obesity, Body mass index is 28.29 kg/m., recommend weight loss, diet and exercise as appropriate    Likely undiagnosed obstructive sleep apnea based on body habitus  Other  Active Problems  Elevated LFT - AST/ALT 54/102, improved    Constipation. Miralax daily -> loose stool -> miralax discontinued -> add imodium   Mild anemia - Hb - 12.3  Concern for elevated ST 6/8 on tele. EKG ok. Troponin neg. Will d/c tele. Pt not a candidate for cardiac intervention. On AC.  Hospital day # 61   Continue present treatment.  No changes.  Yet awaiting skilled nursing facility bed but due to no insurance this will be difficult to find Delia Heady, MD Stroke Neurology 08/13/2019 2:20 PM   To contact Stroke Continuity provider, please refer to [email or web address]. After  hours, contact General Neurology

## 2019-08-13 NOTE — Progress Notes (Signed)
Physical Therapy Treatment Patient Details Name: Lee Hooper MRN: [id number] DOB: [birth year] Today's Date: 08/13/2019    History of Present Illness This 49 y.o. male admitted 4/28 with Rt sided hemiplegia and aphasia.  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  Repeat CT following procedure showed moderate basal ganglia hemorrhage vs. stroke   Repeat MRI revealed that the ICA and MCA re-occluded with change in FLAIR sequence excluding him from repeat thrombectomy.  He underwent a decompressive hemicraniectomy.  He was intubated 4/28, trach 5/8; liberated from ventilator 5/14.  He developed an ileus 5/2.  5/9 IVC filter placed due to bil LE DVTs (extensive). PEG 5/14. PMH includes: COPD, tobacco abuse, psoriasis    PT Comments    Patient has significant impairments, however has been seen twice by myself this week and has demonstrated progress. He has pusher tendencies, however was able to progress to sitting in midline with mirror (pen in left hand to reduce pushing) and minguard assist for at least 2 minutes. He is turning head and eyes to the right to attend to therapist when addressing him. Have increased his frequency to 3x/week for PT (and will try to see more frequently as able). Updating discharge recommendation to CIR as he is showing gains with OT and SLP (per their progress notes).    Follow Up Recommendations  CIR     Equipment Recommendations  Wheelchair (measurements PT);Wheelchair cushion (measurements PT);Hospital bed    Recommendations for Other Services       Precautions / Restrictions Precautions Precautions: Fall Precaution Comments: R hemi, trach (has PMSV), PEG, crani-No Bone flap L head; rt shoulder pain/subluxation; bil LE DVTs with IVC filter    Mobility  Bed Mobility Overal bed mobility: Needs Assistance Bed Mobility: Rolling;Sidelying to Sit;Sit to Sidelying Rolling: Min assist (to his rt with rail) Sidelying to sit: Max assist;+2 for  physical assistance;HOB elevated     Sit to sidelying: +2 for safety/equipment;Min assist General bed mobility comments: with verbal and tactile cues, pt able to initiate rolling to R side with LUE reaching to rail, bending LLE; once sidelying, required assist to bring RLE over side of bed and he moved LLE without assist or cues; HOB then fully elevated and pt able to use LUE to push into rail to elevate trunk but ultimately did requrie MAX A +2 to elevate trunk. pt lying himself down needing only min A to place RUE out and raise RLE to return to sidelying  Transfers                 General transfer comment: pt initially nodded head "yes" however pusher tendencies returned and unable to attempt standing safely  Ambulation/Gait                 Stairs             Wheelchair Mobility    Modified Rankin (Stroke Patients Only) Modified Rankin (Stroke Patients Only) Pre-Morbid Rankin Score: No symptoms Modified Rankin: Severe disability     Balance Overall balance assessment: Needs assistance Sitting-balance support: Feet supported;Single extremity supported Sitting balance-Leahy Scale: Poor Sitting balance - Comments: pt able to progress from using LUE to hold onto footboard on his left to no UE support and minguard while maintaining balance for up to 2 minutes (PT was sitting on his left side on the bed).  Postural control: Right lateral lean  Cognition Arousal/Alertness: Awake/alert Behavior During Therapy: Flat affect Overall Cognitive Status: Impaired/Different from baseline Area of Impairment: Attention;Following commands;Safety/judgement;Problem solving;Awareness                   Current Attention Level: Sustained   Following Commands: Follows one step commands with increased time Safety/Judgement: Decreased awareness of safety;Decreased awareness of deficits   Problem Solving: Slow  processing;Requires verbal cues;Requires tactile cues General Comments: attending to PT in rt visual field; making eye contact; follows simple commands with incr time; when loses his balance to rt, he reaches with LUE for support of foot board (on his left      Exercises      General Comments General comments (skin integrity, edema, etc.): While seated EOB, placed mirror in front of patient for working on midline. He quickly came to midline (holding dry erase marker in left hand to reduce pushing) and was intensely looking at himself in the mirror. Re-oriented to situation and attempted to play tic-tac-toe on mirror with pt seated EOB ~ 15 minutes total (varying max assist to minguard).       Pertinent Vitals/Pain Pain Assessment: Faces Faces Pain Scale: No hurt    Home Living                      Prior Function            PT Goals (current goals can now be found in the care plan section) Acute Rehab PT Goals Patient Stated Goal: nods head "yes" to sit at EOB PT Goal Formulation: Patient unable to participate in goal setting Time For Goal Achievement: 08/24/19 Potential to Achieve Goals: Fair Progress towards PT goals: Progressing toward goals    Frequency    Min 3X/week      PT Plan Discharge plan needs to be updated;Frequency needs to be updated    Co-evaluation              AM-PAC PT "6 Clicks" Mobility   Outcome Measure  Help needed turning from your back to your side while in a flat bed without using bedrails?: A Lot Help needed moving from lying on your back to sitting on the side of a flat bed without using bedrails?: A Lot Help needed moving to and from a bed to a chair (including a wheelchair)?: Total Help needed standing up from a chair using your arms (e.g., wheelchair or bedside chair)?: Total Help needed to walk in hospital room?: Total Help needed climbing 3-5 steps with a railing? : Total 6 Click Score: 8    End of Session Equipment  Utilized During Treatment: Oxygen (28% trach collar; VSS) Activity Tolerance: Patient tolerated treatment well Patient left: in bed;with call bell/phone within reach;with bed alarm set (pt able to point to TV on/off button when asked to) Nurse Communication: Mobility status;Need for lift equipment PT Visit Diagnosis: Other abnormalities of gait and mobility (R26.89);Hemiplegia and hemiparesis Hemiplegia - Right/Left: Right Hemiplegia - dominant/non-dominant: Dominant Hemiplegia - caused by: Nontraumatic intracerebral hemorrhage     Time: 1102-1139 PT Time Calculation (min) (ACUTE ONLY): 37 min  Charges:  $Neuromuscular Re-education: 23-37 mins                      Arby Barrette, PT Pager [phone]    Rexanne Mano 08/13/2019, 5:51 PM

## 2019-08-14 ENCOUNTER — Inpatient Hospital Stay (HOSPITAL_COMMUNITY): Payer: Medicaid Other

## 2019-08-14 DIAGNOSIS — J449 Chronic obstructive pulmonary disease, unspecified: Secondary | ICD-10-CM

## 2019-08-14 DIAGNOSIS — R739 Hyperglycemia, unspecified: Secondary | ICD-10-CM

## 2019-08-14 DIAGNOSIS — R000 Tachycardia, unspecified: Secondary | ICD-10-CM | POA: Insufficient documentation

## 2019-08-14 DIAGNOSIS — L409 Psoriasis, unspecified: Secondary | ICD-10-CM

## 2019-08-14 DIAGNOSIS — I69391 Dysphagia following cerebral infarction: Secondary | ICD-10-CM

## 2019-08-14 DIAGNOSIS — K567 Ileus, unspecified: Secondary | ICD-10-CM | POA: Insufficient documentation

## 2019-08-14 LAB — BASIC METABOLIC PANEL
Anion gap: 7 (ref 5–15)
BUN: 19 mg/dL (ref 6–20)
CO2: 30 mmol/L (ref 22–32)
Calcium: 9.4 mg/dL (ref 8.9–10.3)
Chloride: 98 mmol/L (ref 98–111)
Creatinine, Ser: 0.62 mg/dL (ref 0.61–1.24)
GFR calc Af Amer: >60 mL/min (ref >60)
GFR calc non Af Amer: >60 mL/min (ref >60)
Glucose, Bld: 93 mg/dL (ref 70–99)
Potassium: 4.3 mmol/L (ref 3.5–5.1)
Sodium: 135 mmol/L (ref 135–145)

## 2019-08-14 LAB — GLUCOSE, CAPILLARY
Glucose-Capillary: 100 mg/dL — ABNORMAL HIGH (ref 70–99)
Glucose-Capillary: 117 mg/dL — ABNORMAL HIGH (ref 70–99)
Glucose-Capillary: 165 mg/dL — ABNORMAL HIGH (ref 70–99)
Glucose-Capillary: 198 mg/dL — ABNORMAL HIGH (ref 70–99)
Glucose-Capillary: 276 mg/dL — ABNORMAL HIGH (ref 70–99)
Glucose-Capillary: 99 mg/dL (ref 70–99)

## 2019-08-14 LAB — CBC
HCT: 42.5 % (ref 39.0–52.0)
Hemoglobin: 13.6 g/dL (ref 13.0–17.0)
MCH: 30.5 pg (ref 26.0–34.0)
MCHC: 32 g/dL (ref 30.0–36.0)
MCV: 95.3 fL (ref 80.0–100.0)
Platelets: 230 10*3/uL (ref 150–400)
RBC: 4.46 MIL/uL (ref 4.22–5.81)
RDW: 16.6 % — ABNORMAL HIGH (ref 11.5–15.5)
WBC: 5.6 10*3/uL (ref 4.0–10.5)
nRBC: 0 % (ref 0.0–0.2)

## 2019-08-14 MED ORDER — PNEUMOCOCCAL VAC POLYVALENT 25 MCG/0.5ML IJ INJ
0.5000 mL | INJECTION | INTRAMUSCULAR | Status: AC
  Administered 2019-08-15: 0.5 mL via INTRAMUSCULAR
  Filled 2019-08-14: qty 0.5

## 2019-08-14 NOTE — Progress Notes (Signed)
Physical Therapy Treatment Patient Details Name: Lee Hooper MRN: [id number] DOB: 09/21/1970 Today's Date: 08/14/2019    History of Present Illness This 49 y.o. male admitted 4/28 with Rt sided hemiplegia and aphasia.  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  Repeat CT following procedure showed moderate basal ganglia hemorrhage vs. stroke   Repeat MRI revealed that the ICA and MCA re-occluded with change in FLAIR sequence excluding him from repeat thrombectomy.  He underwent a decompressive hemicraniectomy.  He was intubated 4/28, trach 5/8; liberated from ventilator 5/14.  He developed an ileus 5/2.  5/9 IVC filter placed due to bil LE DVTs (extensive). PEG 5/14. PMH includes: COPD, tobacco abuse, psoriasis    PT Comments    Patient progressing slowly towards PT goals. Alert and following 1 step commands with increased time and cues. Pt initiating movement throughout session. Focused on sitting balance and finding midline using mirror for visual feedback. Able to maintain balance with Min guard assist for 1-2 minutes at a time multiple occasions this session. Difficulty with dual taking resulting in LOB. Able to attend to right side (barely crossing midline) during session with pictures of family. Pt demonstrates good balance reactions and less pusher tendencies today. Due to improved sitting balance, will likely be ready for standing trials next session. Will follow.   Follow Up Recommendations  CIR     Equipment Recommendations  Wheelchair (measurements PT);Wheelchair cushion (measurements PT);Hospital bed    Recommendations for Other Services       Precautions / Restrictions Precautions Precautions: Fall Precaution Comments: R hemi, trach (has PMSV), PEG, crani-No Bone flap L head; rt shoulder pain/subluxation; bil LE DVTs with IVC filter Restrictions Weight Bearing Restrictions: No    Mobility  Bed Mobility Overal bed mobility: Needs Assistance Bed  Mobility: Rolling;Sidelying to Sit;Sit to Sidelying Rolling: Min assist (to his right with rail) Sidelying to sit: Max assist;+2 for physical assistance;HOB elevated     Sit to sidelying: Mod assist;+2 for physical assistance;HOB elevated General bed mobility comments: Able to roll to right with cues to bend left knee and reach for rail; once sidelying, assist to bring RLE off bed and to elevate trunk. Lying self down but assist needed with LEs to bring into bed.  Transfers                 General transfer comment: Deferred due to safety/pusher tendencies. Will likely be ready next session  Ambulation/Gait                 Stairs             Wheelchair Mobility    Modified Rankin (Stroke Patients Only)       Balance Overall balance assessment: Needs assistance Sitting-balance support: Feet supported;Single extremity supported Sitting balance-Leahy Scale: Poor Sitting balance - Comments: Able to sit EOB unsupported for 1-2 minutes at a time x3 with cues for upright. Noted to have balance reactions with LOB towards right. Used mirror to work on midline (holding towel in hand to reduce pushing). Working on visually scanning to right with pictures, finding midline with mirror and balance. Varying assist needed, Min guard to a few instances of Max A when fatigued. Postural control: Right lateral lean                                  Cognition Arousal/Alertness: Awake/alert Behavior During Therapy: Flat  affect Overall Cognitive Status: Impaired/Different from baseline Area of Impairment: Attention;Following commands;Safety/judgement;Problem solving;Awareness                   Current Attention Level: Sustained   Following Commands: Follows one step commands with increased time;Follows one step commands inconsistently Safety/Judgement: Decreased awareness of safety;Decreased awareness of deficits Awareness: Intellectual Problem Solving:  Slow processing;Requires verbal cues;Requires tactile cues;Decreased initiation General Comments: prefers left gaze preference; able to get to midlinea nd slightly right gaze with cues and pictures of family. Follows simple comands inconsistently with increased time/cues. Pt attempting to verbalize minimally but difficult to comprehend.      Exercises      General Comments        Pertinent Vitals/Pain Pain Assessment: Faces Faces Pain Scale: Hurts a little bit Pain Location: while coughing and during bed mobility Pain Descriptors / Indicators: Grimacing Pain Intervention(s): Limited activity within patient's tolerance;Monitored during session;Repositioned    Home Living                      Prior Function            PT Goals (current goals can now be found in the care plan section) Progress towards PT goals: Progressing toward goals    Frequency    Min 3X/week      PT Plan Current plan remains appropriate    Co-evaluation PT/OT/SLP Co-Evaluation/Treatment: Yes Reason for Co-Treatment: Complexity of the patient's impairments (multi-system involvement);For patient/therapist safety;Necessary to address cognition/behavior during functional activity PT goals addressed during session: Mobility/safety with mobility;Balance        AM-PAC PT "6 Clicks" Mobility   Outcome Measure  Help needed turning from your back to your side while in a flat bed without using bedrails?: A Little Help needed moving from lying on your back to sitting on the side of a flat bed without using bedrails?: A Lot Help needed moving to and from a bed to a chair (including a wheelchair)?: Total Help needed standing up from a chair using your arms (e.g., wheelchair or bedside chair)?: Total Help needed to walk in hospital room?: Total Help needed climbing 3-5 steps with a railing? : Total 6 Click Score: 9    End of Session Equipment Utilized During Treatment: Oxygen (28% Fi02 trach  collar) Activity Tolerance: Patient tolerated treatment well Patient left: in bed;with call bell/phone within reach;with bed alarm set Nurse Communication: Mobility status;Need for lift equipment PT Visit Diagnosis: Other abnormalities of gait and mobility (R26.89);Hemiplegia and hemiparesis Hemiplegia - Right/Left: Right Hemiplegia - dominant/non-dominant: Dominant Hemiplegia - caused by: Nontraumatic intracerebral hemorrhage     Time: 6387-5643 PT Time Calculation (min) (ACUTE ONLY): 29 min  Charges:  $Neuromuscular Re-education: 8-22 mins                     Vale Haven, PT, DPT Acute Rehabilitation Services Pager [phone] Office [phone]       Blake Divine A Lanier Ensign 08/14/2019, 4:07 PM

## 2019-08-14 NOTE — Progress Notes (Signed)
Rehab Admissions Coordinator Note:  Patient was screened by Lee Hooper for appropriateness for an Inpatient Acute Rehab Consult per PT change in recommendations.  At this time, we are recommending Inpatient Rehab consult. I will place order per protocol.  Lee Dupes RN MSN 08/14/2019, 11:39 AM  I can be reached at [phone].

## 2019-08-14 NOTE — Progress Notes (Signed)
INTERVAL HISTORY  He is lying comfortably in bed  Patient is awake arousable follows simple commands on the left.  CBC and BMP today are normal.  Vital signs stable Vitals:   08/14/19 0742 08/14/19 0800 08/14/19 1122 08/14/19 1149  BP: 131/79   (!) 149/88  Pulse: 90 88 84 (!) 103  Resp: (!) 22 20 18 20   Temp: 98.5 F (36.9 C)   98.4 F (36.9 C)  TempSrc: Oral   Oral  SpO2: 100% 96% 95% 97%  Weight:      Height:       CBC:  Recent Labs  Lab 08/12/19 0422 08/14/19 0520  WBC 4.8 5.6  HGB 13.2 13.6  HCT 41.4 42.5  MCV 95.0 95.3  PLT 247 230   Basic Metabolic Panel:  Recent Labs  Lab 08/12/19 0422 08/14/19 0520  NA 138 135  K 4.4 4.3  CL 98 98  CO2 31 30  GLUCOSE 99 93  BUN 17 19  CREATININE 0.65 0.62  CALCIUM 9.2 9.4    IMAGING past 24 hours No results found.   PHYSICAL EXAM    General- mildly obese middle-aged Caucasian male, s/p tracheostomy on trach collar  Ophthalmologic- fundi not visualized due to noncooperation.  Cardiovascular - Regular rate and rhythm.  Neuro - s/p tracheostomy on trach collar. Initially sleepy but easily arousable, global aphasia, left gaze preference position, barely cross midline, notblinking to visual threat on the left, PERRL. Right facial droop. Tongue protrusion not cooperative. LUE spontaneous movement at least 4/5, LLE against gravity at least 3/5, right UE and LE flaccid, slight 1/5 movement with pain at RLE. DTR 1+ and no babinski. Sensation, coordination and gait not tested.    ASSESSMENT/PLAN [name] with history of COPD, ongoing tobacco use, and psoriasis, presenting with dense LMCA syndrome. Attempt for Left ICA and Left M1 thrombectomy was done emergently with TICI 2b flow. Unfortunately, the pt had post procedure hemorrhage and reocclusion of the vessels. F/u imaging suggested early edema and pt underwent prophylactic decompressive hemicraniectomy.  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 Stroke etiology unknown - given DVT requiring eventual AC and high mRS, will not do further embolic workup  Eliquis for VTE prophylaxis  No antithrombotic prior to admission, now on Eliquis.  Therapy recommendations:  SNF   Disposition:  Pending - [facility] consulted however no insurance - await bed  Replace flap prior to d/c to SNF. Reach out to Dr. 44 to replace once bed located. Likely need to wait until after 2 mos.  Respiratory failure s/p Trach Trach wound  Downsized to cuffless trach 6 by CCM 5/24  On trach collar  Bilateral Lower Ext DVT, extensive Possible PE given tachycardia  Venous 6/24 07/11/2019 DVT + bilateral extensive  IVC filter placed 5/9  On eliquis now for DVT prophylaxis  Dysphagia   NPO  PEG placement 5/14  FW 150 Q4h   On bolus TF  Tobacco abuse  Current smoker  Smoking cessation counseling will be provided if able  Other Stroke Risk Factors  Obesity, Body mass index is 27.66 kg/m., recommend weight loss, diet and exercise as appropriate    Likely undiagnosed obstructive sleep apnea based  on body habitus  Other Active Problems  Elevated LFT - AST/ALT 54/102, improved    Constipation. Miralax daily -> loose stool -> miralax discontinued -> add imodium   Mild anemia - Hb - 12.3  Concern for elevated ST 6/8 on tele. EKG ok. Troponin neg. Will d/c tele. Pt not a candidate for cardiac intervention. On AC.  Hospital day # 14   Continue present treatment.  No changes.  Yet awaiting skilled nursing facility bed  Lee Contras, MD Stroke Neurology 08/14/2019 1:53 PM   To contact Stroke Continuity provider, please refer to [email or web address] After hours, contact General  Neurology

## 2019-08-14 NOTE — Consult Note (Signed)
Physical Medicine and Rehabilitation Consult   Reason for Consult: Stroke with functional deficits Referring Physician: Dr. Pearlean Brownie  HPI: Lee Hooper is a 49 y.o. male with history of COPD, tobacco abuse, psoriasis who was admitted on 07/01/2019 after found by daughter with decreased level of consciousness, right-sided weakness and difficulty talking.  History taken from chart review due to mentation. CT head showing hyperdense left ICA and MCA compatible with acute thrombus.  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 MRI brain personally reviewed, showing left brain infarct.  Restricted diffusion through much of left PCA territory due to acute ischemia.  He underwent cerebral angio with revascularization of left ICA and left M1 by Dr. Corliss Skains. However, postprocedure had reocclusion of his ICA and MCA as well as 3.0 x 2.6 x 3.9 cm hypodensity within left basal ganglia, left subinsular region and inferomedial left temporal lobe likely combination of parenchymal hematoma and contrast training as well as edema, regional mass-effect with effacement of left lateral ventricle and scattered small volume SAH along the left cerebral hemisphere.  He underwent left hemicraniectomy for decompression by Dr. Dreama Saa placed in abdomen. Post procedure on Cleviprex for hypertension, had decrease level of arousal with minimal response, dense left hemiplegia tonic saline progressive edema with transcalvarial herniation.  Continued to require vent support despite diuresis, course of prednisone for COPD exacerbation, HCAP and Unasyn for HCAP.  Trach placed by Dr. Isaiah Serge on 05/08 and PEG placed by Dr. Bedelia Person on 05/14.  Hospital course further complicated by ileus, leucocytosis with electrolyte  abnormalities, acute renal injury, extensive BLE  DVT--IVC filter placed 05/08, tachycardia felt to be likely due to underlying PE, persistent fevers due to E coli HCAP, CSF leak from incision--sutured, stage III pressure injury under trach and difficulty handling secretions. He has been extubated to ATC and is tolerating PMSV. He continued to have drainage from scalp wound with dehiscence and underwent scalp wound revision on 06/02--for cranioplaty in the near future.  Patient with resultant dysphagia--remains NPO,  verbal apraxia with aphasia, left gaze preference with pusher tendencies and dense right hemiplegia. Therapy working on balance at EOB and activity tolerance improving. CIR recommended due to functional decline.   Review of Systems  Unable to perform ROS: Mental acuity    Past Medical History: Unable to obtain from patient  Past Surgical History:  Procedure Laterality Date  . CRANIOTOMY Left 07/01/2019   Procedure: Left Hemicraniectomy;  Surgeon: Coletta Memos, MD;  Location: [facility] OR;  Service: Neurosurgery;  Laterality: Left;  . ESOPHAGOGASTRODUODENOSCOPY N/A 07/17/2019   Procedure: ESOPHAGOGASTRODUODENOSCOPY (EGD);  Surgeon: Diamantina Monks, MD;  Location: [facility] ENDOSCOPY;  Service: General;  Laterality: N/A;  . IR CT HEAD LTD  07/01/2019  . IR IVC FILTER PLMT / S&I /IMG GUID/MOD SED  07/12/2019  . IR PERCUTANEOUS ART THROMBECTOMY/INFUSION INTRACRANIAL INC DIAG ANGIO  07/01/2019  . LESION EXCISION N/A 08/05/2019   Procedure: Scalp Wound Revision;  Surgeon: Coletta Memos, MD;  Location: [facility] OR;  Service: Neurosurgery;  Laterality: N/A;  left,posterior  . PEG PLACEMENT N/A 07/17/2019   Procedure: PERCUTANEOUS ENDOSCOPIC GASTROSTOMY (PEG) PLACEMENT;  Surgeon: Diamantina Monks, MD;  Location: [facility] ENDOSCOPY;  Service: General;  Laterality: N/A;  . RADIOLOGY WITH ANESTHESIA N/A 07/01/2019   Procedure: IR WITH ANESTHESIA;  Surgeon: Radiologist,  Medication, MD;  Location: [facility] OR;  Service: Radiology;   Laterality: N/A;    Family History: Unable to elicit due to cognition/aphasia.    Social History:  Per reports  he has been smoking. He has never used smokeless tobacco. No history on file for alcohol use and drug use.    Allergies  Allergen Reactions  . Tegaderm Ag Mesh [Silver] Other (See Comments)    blisters   Medications Prior to Admission  Medication Sig Dispense Refill  . Caffeine-Magnesium Salicylate (DIUREX PO) Take 1 tablet by mouth daily as needed (fluid).    . naproxen sodium (ALEVE) 220 MG tablet Take 220 mg by mouth 2 (two) times daily.      Home: Home Living Family/patient expects to be discharged to:: Unsure Living Arrangements: Spouse/significant other Additional Comments: Pt unable to provide info and no family available   Functional History: Prior Function Level of Independence: Independent Comments: Per RN, pt was independent, drives, and works as a [employer] copy Status:  Mobility: Bed Mobility Overal bed mobility: Needs Assistance Bed Mobility: Rolling, Sidelying to Sit, Sit to Sidelying Rolling: Min assist (to his rt with rail) Sidelying to sit: Max assist, +2 for physical assistance, HOB elevated Supine to sit: +2 for physical assistance, Max assist, HOB elevated Sit to supine: +2 for physical assistance, Max assist Sit to sidelying: +2 for safety/equipment, Min assist General bed mobility comments: with verbal and tactile cues, pt able to initiate rolling to R side with LUE reaching to rail, bending LLE; once sidelying, required assist to bring RLE over side of bed and he moved LLE without assist or cues; HOB then fully elevated and pt able to use LUE to push into rail to elevate trunk but ultimately did requrie MAX A +2 to elevate trunk. pt lying himself down needing only min A to place RUE out and raise RLE to return to sidelying Transfers Overall transfer level: Needs assistance Equipment used: 2 person hand held assist Transfers: Sit  to/from Stand Sit to Stand: Max assist, +2 physical assistance General transfer comment: pt initially nodded head "yes" however pusher tendencies returned and unable to attempt standing safely      ADL: ADL Overall ADL's : Needs assistance/impaired Eating/Feeding: NPO Grooming: Wash/dry face, Sitting, Total assistance Grooming Details (indicate cue type and reason): pt unable to wash face without total A this session; heavily reliant on using LUE for sitting balance on end of bed Upper Body Bathing: Maximal assistance Upper Body Bathing Details (indicate cue type and reason): hand over hand to initiate adn maintian attention to task Lower Body Dressing: Total assistance, Bed level Lower Body Dressing Details (indicate cue type and reason): to don socks Toilet Transfer Details (indicate cue type and reason): defer d/t safety Functional mobility during ADLs: Maximal assistance, +2 for physical assistance (bed mobility only) General ADL Comments: pt unable to follow commands to engage in ADLS this session needing total A to wash face EOB; MAX A +2 to transition EOB exiting to pts R side. pt able to use LUE to maintain sitting balance but requried MAX A for sitting balance when attempting to being BUEs to lap  Cognition: Cognition Overall Cognitive Status: Impaired/Different from baseline Arousal/Alertness: Awake/alert Orientation Level: Intubated/Tracheostomy - Unable to assess Attention: Focused, Sustained Focused Attention: Appears intact Sustained Attention: Impaired Sustained Attention Impairment: Verbal basic Memory:  (UTA) Awareness: Impaired Awareness Impairment: Intellectual impairment, Emergent impairment Safety/Judgment: Impaired Cognition Arousal/Alertness: Awake/alert Behavior During Therapy: Flat affect Overall Cognitive Status: Impaired/Different  from baseline Area of Impairment: Attention, Following commands, Safety/judgement, Problem solving, Awareness Current  Attention Level: Sustained Following Commands: Follows one step commands with increased time Safety/Judgement: Decreased awareness of safety, Decreased awareness of deficits Awareness: Intellectual Problem Solving: Slow processing, Requires verbal cues, Requires tactile cues General Comments: attending to PT in rt visual field; making eye contact; follows simple commands with incr time; when loses his balance to rt, he reaches with LUE for support of foot board (on his left Difficult to assess due to: Tracheostomy, Impaired communication  Blood pressure 131/79, pulse 84, temperature 98.5 F (36.9 C), temperature source Oral, resp. rate 18, height 6' (1.829 m), weight 92.5 kg, SpO2 95 %. Physical Exam  Nursing note and vitals reviewed. Constitutional: No distress.  Lying in bed with right bias. Restless movements intermittently.   HENT:  Right Ear: External ear normal.  Left Ear: External ear normal.  Nose: Nose normal.  Eyes: Right eye exhibits no discharge. Left eye exhibits no discharge. No scleral icterus.  Neck:  +Trach in place with ATC  Cardiovascular: Regular rhythm. Tachycardia present.  Respiratory: Effort normal. No respiratory distress.  GI: He exhibits no distension.  +PEG  Musculoskeletal:        General: No swelling or tenderness.  Neurological: He is alert.  Left facial weakness with ptosis.  Did not attempt to communicate or phonate. Motor: Limited due to ability to follow commands, but no movement noted on right side Moving LUE freely  Skin:  Multiple psoriatic plaques on flexor surfaces. Plaque on left knee See above  Psychiatric:  Unable to assess due to mentation    Results for orders placed or performed during the hospital encounter of 07/01/19 (from the past 24 hour(s))  Glucose, capillary     Status: Abnormal   Collection Time: 08/13/19  4:45 PM  Result Value Ref Range   Glucose-Capillary 112 (H) 70 - 99 mg/dL  Glucose, capillary     Status:  Abnormal   Collection Time: 08/13/19  7:52 PM  Result Value Ref Range   Glucose-Capillary 152 (H) 70 - 99 mg/dL   Comment 1 Notify RN    Comment 2 Document in Chart   Glucose, capillary     Status: Abnormal   Collection Time: 08/13/19 11:49 PM  Result Value Ref Range   Glucose-Capillary 152 (H) 70 - 99 mg/dL   Comment 1 Notify RN    Comment 2 Document in Chart   Glucose, capillary     Status: Abnormal   Collection Time: 08/14/19  3:34 AM  Result Value Ref Range   Glucose-Capillary 117 (H) 70 - 99 mg/dL   Comment 1 Notify RN    Comment 2 Document in Chart   Basic metabolic panel     Status: None   Collection Time: 08/14/19  5:20 AM  Result Value Ref Range   Sodium 135 135 - 145 mmol/L   Potassium 4.3 3.5 - 5.1 mmol/L   Chloride 98 98 - 111 mmol/L   CO2 30 22 - 32 mmol/L   Glucose, Bld 93 70 - 99 mg/dL   BUN 19 6 - 20 mg/dL   Creatinine, Ser 0.62 0.61 - 1.24 mg/dL   Calcium 9.4 8.9 - 10.3 mg/dL   GFR calc non Af Amer >60 >60 mL/min   GFR calc Af Amer >60 >60 mL/min   Anion gap 7 5 - 15  CBC     Status: Abnormal   Collection Time: 08/14/19  5:20 AM  Result  Value Ref Range   WBC 5.6 4.0 - 10.5 K/uL   RBC 4.46 4.22 - 5.81 MIL/uL   Hemoglobin 13.6 13.0 - 17.0 g/dL   HCT 70.1 39 - 52 %   MCV 95.3 80.0 - 100.0 fL   MCH 30.5 26.0 - 34.0 pg   MCHC 32.0 30.0 - 36.0 g/dL   RDW 77.9 (H) 39.0 - 30.0 %   Platelets 230 150 - 400 K/uL   nRBC 0.0 0.0 - 0.2 %  Glucose, capillary     Status: Abnormal   Collection Time: 08/14/19  8:16 AM  Result Value Ref Range   Glucose-Capillary 100 (H) 70 - 99 mg/dL   No results found.  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  Records reviewed and summated above.  1. Does the need for close, 24 hr/day medical supervision in concert with the patient's rehab needs make it unreasonable for this patient to be served in a less  intensive setting? Yes 2. Co-Morbidities requiring supervision/potential complications:  COPD (monitor O2 sats and RR with increased exertion), tobacco abuse (counsel when appropriate), psoriasis, DVTs (IVC filer in place, cont anticoagulation), post-stroke dysphagia (advance diet as tolerated), Tachycardia (monitor in accordance with pain and increasing activity), hyperglycemia (Monitor in accordance with exercise and adjust meds as necessary)  3. Due to bladder management, bowel management, safety, skin/wound care, disease management, medication administration, pain management and patient education, does the patient require 24 hr/day rehab nursing? Yes 4. Does the patient require coordinated care of a physician, rehab nurse, therapy disciplines of PT/OT/SLP to address physical and functional deficits in the context of the above medical diagnosis(es)? Yes Addressing deficits in the following areas: balance, endurance, locomotion, strength, transferring, bowel/bladder control, bathing, dressing, feeding, grooming, toileting, cognition, speech, language, swallowing and psychosocial support 5. Can the patient actively participate in an intensive therapy program of at least 3 hrs of therapy per day at least 5 days per week? Yes 6. The potential for patient to make measurable gains while on inpatient rehab is excellent 7. Anticipated functional outcomes upon discharge from inpatient rehab are min assist and mod assist  with PT, min assist and mod assist with OT, min assist and mod assist with SLP. 8. Estimated rehab length of stay to reach the above functional goals is: 27-32 days. 9. Anticipated discharge destination: Home 10. Overall Rehab/Functional Prognosis: good and fair  RECOMMENDATIONS: This patient's condition is appropriate for continued rehabilitative care in the following setting: CIR to decrease burden of care if necessary Patient has agreed to participate in recommended program.  Potentially Note that insurance prior authorization may be required for reimbursement for recommended care.  Comment: Rehab Admissions Coordinator to follow up.  I have personally performed a face to face diagnostic evaluation, including, but not limited to relevant history and physical exam findings, of this patient and developed relevant assessment and plan.  Additionally, I have reviewed and concur with the physician assistant's documentation above.   Maryla Morrow, MD, ABPMR Jacquelynn Cree, PA-C 08/14/2019

## 2019-08-14 NOTE — Progress Notes (Addendum)
Occupational Therapy Treatment Patient Details Name: Lee Hooper MRN: [id number] DOB: 06-13-1970 Today's Date: 08/14/2019    History of present illness This 49 y.o. male admitted 4/28 with Rt sided hemiplegia and aphasia.  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  Repeat CT following procedure showed moderate basal ganglia hemorrhage vs. stroke   Repeat MRI revealed that the ICA and MCA re-occluded with change in FLAIR sequence excluding him from repeat thrombectomy.  He underwent a decompressive hemicraniectomy.  He was intubated 4/28, trach 5/8; liberated from ventilator 5/14.  He developed an ileus 5/2.  5/9 IVC filter placed due to bil LE DVTs (extensive). PEG 5/14. PMH includes: COPD, tobacco abuse, psoriasis   OT comments  Pt making good progress towards OT goals this session. Pt able to progress to EOB with MOD - MAX A +2 and able to sit EOB with MIN -MAX A. Utilized mirror to assist with maintaining midline with good success. Pt benefits from holding item in L hand to assist with pushing. Updated DC recs to CIR as pt is making progress on acute; will let OT/L know about change in POC. Plan to attempt sit<>stand next session.    Follow Up Recommendations  CIR    Equipment Recommendations  Other (comment) (defer to next venue of care)    Recommendations for Other Services      Precautions / Restrictions Precautions Precautions: Fall Precaution Comments: R hemi, trach (has PMSV), PEG, crani-No Bone flap L head; rt shoulder pain/subluxation; bil LE DVTs with IVC filter Restrictions Weight Bearing Restrictions: No       Mobility Bed Mobility Overal bed mobility: Needs Assistance Bed Mobility: Rolling;Sidelying to Sit;Sit to Sidelying Rolling: Min assist (rolling to R reaching with LUE to rail) Sidelying to sit: Max assist;+2 for physical assistance;HOB elevated     Sit to sidelying: Mod assist;+2 for physical assistance;HOB elevated General bed mobility  comments: Able to roll to right with cues to bend left knee and reach for rail; once sidelying, assist to bring RLE off bed and to elevate trunk. Lying self down but assist needed with LEs to bring into bed.  Transfers                 General transfer comment: Deferred due to safety/pusher tendencies. Will likely be ready next session    Balance Overall balance assessment: Needs assistance Sitting-balance support: Feet supported;Single extremity supported Sitting balance-Leahy Scale: Poor Sitting balance - Comments: Able to sit EOB unsupported for 1-2 minutes at a time x3 with cues for upright. Noted to have balance reactions with LOB towards right. Used mirror to work on midline (holding towel in hand to reduce pushing). Working on visually scanning to right with pictures, finding midline with mirror and balance. Varying assist needed, Min guard to a few instances of Max A when fatigued. Postural control: Right lateral lean                                 ADL either performed or assessed with clinical judgement   ADL Overall ADL's : Needs assistance/impaired     Grooming: Wash/dry face;Sitting;Set up;Minimal assistance Grooming Details (indicate cue type and reason): pt able to wash face from EOB this session with MIN A for sitting balance with LUE  Functional mobility during ADLs: Maximal assistance;+2 for physical assistance (bed mobility only) General ADL Comments: pt with improvements this session able to sit EOB with MIN - MOD A. improved lateral lean when cued for pt to hold item in L hand, use of mirror and family photos this session to facilitate midline posture     Vision       Perception     Praxis      Cognition Arousal/Alertness: Awake/alert Behavior During Therapy: Flat affect Overall Cognitive Status: Impaired/Different from baseline Area of Impairment: Attention;Following  commands;Safety/judgement;Problem solving;Awareness                   Current Attention Level: Sustained   Following Commands: Follows one step commands with increased time;Follows one step commands inconsistently Safety/Judgement: Decreased awareness of safety;Decreased awareness of deficits Awareness: Intellectual Problem Solving: Slow processing;Requires verbal cues;Requires tactile cues;Decreased initiation General Comments: prefers left gaze preference; able to get to midlinea nd slightly right gaze with cues and pictures of family. Follows simple comands inconsistently with increased time/cues. Pt attempting to verbalize minimally but difficult to comprehend.        Exercises     Shoulder Instructions       General Comments good sucess with use of mirror and family photos on mirror to track and facilitate midline posture. also does well with item placed in L hand to divert pusher tendencies    Pertinent Vitals/ Pain       Pain Assessment: Faces Faces Pain Scale: Hurts a little bit Pain Location: while coughing and during bed mobility Pain Descriptors / Indicators: Grimacing Pain Intervention(s): Limited activity within patient's tolerance;Monitored during session;Repositioned  Home Living                                          Prior Functioning/Environment              Frequency  Min 2X/week        Progress Toward Goals  OT Goals(current goals can now be found in the care plan section)  Progress towards OT goals: Progressing toward goals  Acute Rehab OT Goals Patient Stated Goal: nods head "yes" to sit at EOB OT Goal Formulation: Patient unable to participate in goal setting Time For Goal Achievement: 08/18/19 Potential to Achieve Goals: Fair  Plan Discharge plan needs to be updated;Frequency needs to be updated    Co-evaluation      Reason for Co-Treatment: Complexity of the patient's impairments (multi-system  involvement);For patient/therapist safety;Necessary to address cognition/behavior during functional activity PT goals addressed during session: Mobility/safety with mobility;Balance        AM-PAC OT "6 Clicks" Daily Activity     Outcome Measure   Help from another person eating meals?: Total Help from another person taking care of personal grooming?: Total Help from another person toileting, which includes using toliet, bedpan, or urinal?: Total Help from another person bathing (including washing, rinsing, drying)?: Total Help from another person to put on and taking off regular upper body clothing?: Total Help from another person to put on and taking off regular lower body clothing?: Total 6 Click Score: 6    End of Session Equipment Utilized During Treatment: Other (comment);Oxygen (trach collar with PMSV; 28% FiO2 5LPM)  OT Visit Diagnosis: Unsteadiness on feet (R26.81);Cognitive communication deficit (R41.841);Hemiplegia and hemiparesis;Muscle weakness (generalized) (M62.81);Apraxia (R48.2);Other symptoms and signs involving cognitive function;Pain  Symptoms and signs involving cognitive functions: Cerebral infarction Hemiplegia - Right/Left: Right Hemiplegia - dominant/non-dominant: Dominant Hemiplegia - caused by: Cerebral infarction   Activity Tolerance Patient tolerated treatment well   Patient Left in bed;with call bell/phone within reach;with bed alarm set   Nurse Communication Mobility status        Time: 8616-8372 OT Time Calculation (min): 32 min  Charges: OT General Charges $OT Visit: 1 Visit OT Treatments $Therapeutic Activity: 8-22 mins  Lanier Clam., COTA/L Acute Rehabilitation Services [phone]    Ihor Gully 08/14/2019, 4:34 PM

## 2019-08-15 LAB — GLUCOSE, CAPILLARY
Glucose-Capillary: 77 mg/dL (ref 70–99)
Glucose-Capillary: 106 mg/dL — ABNORMAL HIGH (ref 70–99)
Glucose-Capillary: 117 mg/dL — ABNORMAL HIGH (ref 70–99)
Glucose-Capillary: 111 mg/dL — ABNORMAL HIGH (ref 70–99)
Glucose-Capillary: 97 mg/dL (ref 70–99)

## 2019-08-15 NOTE — Progress Notes (Addendum)
Inpatient Rehab Admissions Coordinator:   Spoke with pt.'s wife via telephone  to discuss potential CIR admission. She stated interest but has concerns related to payor source, ability to provide support after discharge, and inaccessible home environment. Will pursue for potential admit next week, pending bed availability.   Megan Salon, MS, CCC-SLP Rehab Admissions Coordinator  [phone] (celll) [phone] (office)

## 2019-08-15 NOTE — Plan of Care (Signed)
°  Problem: Education: °Goal: Knowledge of disease or condition will improve °Outcome: Progressing °  °Problem: Nutrition: °Goal: Risk of aspiration will decrease °Outcome: Progressing °  °Problem: Safety: °Goal: Ability to remain free from injury will improve °Outcome: Progressing °  °

## 2019-08-15 NOTE — Progress Notes (Signed)
INTERVAL HISTORY  He is lying comfortably in bed  Patient is awake arousable follows simple commands on the left.  CBC and BMP yesterday were normal.  Vital signs stable. Trach, sleeping comfortably, no events overnight.    Vitals:   08/15/19 0718 08/15/19 0832 08/15/19 1123 08/15/19 1221  BP:  133/80  122/75  Pulse: 86 82 86 (!) 108  Resp: 16 16 18 20   Temp:  98.5 F (36.9 C)  98.3 F (36.8 C)  TempSrc:  Oral  Oral  SpO2: 95% 97% 91% 96%  Weight:      Height:       CBC:  Recent Labs  Lab 08/12/19 0422 08/14/19 0520  WBC 4.8 5.6  HGB 13.2 13.6  HCT 41.4 42.5  MCV 95.0 95.3  PLT 247 176   Basic Metabolic Panel:  Recent Labs  Lab 08/12/19 0422 08/14/19 0520  NA 138 135  Lee 4.4 4.3  CL 98 98  CO2 31 30  GLUCOSE 99 93  BUN 17 19  CREATININE 0.65 0.62  CALCIUM 9.2 9.4    IMAGING past 24 hours CT HEAD WO CONTRAST  Result Date: 08/14/2019 CLINICAL DATA:  49 year old male with large left hemisphere infarct, left hemi craniectomy. Cranioplasty preoperative planning. EXAM: CT HEAD WITHOUT CONTRAST TECHNIQUE: Contiguous axial images were obtained from the base of the skull through the vertex without intravenous contrast. COMPARISON:  Head CT 07/23/2019 and earlier. 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 And this herniation tracks to the left vertex under the scalp soft tissues (series 6, image 19). Scattered areas of laminar necrosis versus petechial hemorrhage along the margins of the infarct, including in the left occipital lobe. There is mild intracranial rightward midline shift of 4-5 mm with mild mass effect on the body of the left lateral ventricle. But there is no ventriculomegaly or transependymal edema suspected. Right hemisphere and posterior fossa gray-white matter differentiation is preserved. No acute  intracranial blood products. Vascular: Hyperdense left MCA suspected. Skull: Stable left hemi craniectomy. Sinuses/Orbits: Visualized paranasal sinuses and mastoids are stable and well pneumatized. Other: Postoperative changes to the left scalp with continued substantial herniation of intracranial contents (series 6, image 66 today). Orbits soft tissues remain normal. IMPRESSION: 1. Extensive left hemisphere cystic encephalomalacia with broad-based herniation of intracranial contents through the craniectomy defect, stable since 07/05/2019. Scattered associated laminar necrosis versus petechial hemorrhage. 2. Mild rightward midline shift of 4-5 mm. Basilar cisterns remain patent. 3. No new intracranial abnormality. Electronically Signed   By: Genevie Ann M.D.   On: 08/14/2019 17:32     PHYSICAL EXAM    General- middle-aged Caucasian male, s/p tracheostomy on trach collar, sleeping but arousable.   Ophthalmologic- fundi not visualized due to noncooperation.  Cardiovascular - Regular rate and rhythm.  Neuro - s/p tracheostomy on trach collar. Initially sleepy but arousable,resists eye opening, global aphasia, with forced eye opening left gaze preference position, barely cross midline, notblinking to visual threat on the left, PERRL. Right facial droop. Tongue protrusion not cooperative. LUE spontaneous movement at least 4/5, LLE against gravity at least 3/5, right UE and LE flaccid still and does not seem to be developing rigidity or spasticity at this time, slight 1/5 movement with pain at RLE no withdrawal to pain in upper. Sensation, coordination and gait not tested. Not cooperative this morning.    ASSESSMENT/PLAN Lee Hooper  Lee Hooper is a 49 y.o. male with history of COPD, ongoing tobacco use, and psoriasis, presenting with dense LMCA syndrome. Attempt for Left ICA and Left M1 thrombectomy was done emergently with TICI 2b flow. Unfortunately, the pt had post procedure hemorrhage and reocclusion  of the vessels. F/u imaging suggested early edema and pt underwent prophylactic decompressive hemicraniectomy.  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 Stroke etiology unknown - given DVT requiring eventual AC and high mRS, will not do further embolic workup  Eliquis for VTE prophylaxis  No antithrombotic prior to admission, now on Eliquis.  Therapy recommendations:  SNF   Disposition:  Pending - LTACH consulted however no insurance - await bed  Replace flap prior to d/c to SNF. Reach out to Dr. Franky Macho to replace once bed located. Likely need to wait until after 2 mos.  CT 08/14/19 - Extensive left hemisphere cystic encephalomalacia with broad-based herniation of intracranial contents through the craniectomy defect, stable since 07/05/2019. Scattered associated laminar necrosis versus petechial hemorrhage. Mild rightward midline shift of 4-5 mm. Basilar cisterns remain patent. No new intracranial abnormality.  Respiratory failure s/p Trach Trach wound  Downsized to cuffless trach 6 by CCM 5/24  On trach collar  Bilateral Lower Ext DVT, extensive Possible PE given tachycardia  Venous US 07/11/2019 DVT + bilateral extensive  IVC filter placed 5/9  On eliquis now for DVT prophylaxis  Dysphagia   NPO  PEG placement 5/14  FW 150 Q4h   On bolus TF  Tobacco abuse  Current smoker  Smoking cessation counseling will be provided if able  Other Stroke Risk Factors  Obesity, Body mass index is 27.48 kg/m., recommend weight loss, diet and exercise as appropriate    Likely undiagnosed obstructive sleep apnea based on body habitus  Other Active Problems  Elevated LFT - AST/ALT 54/102, improved    Constipation. Miralax  daily -> loose stool -> miralax discontinued -> add imodium   Mild anemia - Hb - 12.3.....13.6 - stable  Concern for elevated ST 6/8 on tele. EKG ok. Troponin neg. Will d/c tele. Pt not a candidate for cardiac intervention. On AC.  Hospital day # 45   Continue present treatment.  No changes.  Yet awaiting skilled nursing facility bed   Personally examined patient and images, and have participated in and made any corrections needed to history, physical, neuro exam,assessment and plan as stated above.  I have personally obtained the history, evaluated lab date, reviewed imaging studies and agree with radiology interpretations.    Naomie Dean, MD Stroke Neurology   A total of 25 minutes was spent for the care of this patient, spent on counseling patient and family on different diagnostic and therapeutic options, counseling and coordination of care, risks ans benefits of management, compliance, or risk factor reduction and education, documentation.   To contact Stroke Continuity provider, please refer to [email or web address]. After hours, contact General Neurology

## 2019-08-16 LAB — GLUCOSE, CAPILLARY
Glucose-Capillary: 101 mg/dL — ABNORMAL HIGH (ref 70–99)
Glucose-Capillary: 106 mg/dL — ABNORMAL HIGH (ref 70–99)
Glucose-Capillary: 139 mg/dL — ABNORMAL HIGH (ref 70–99)
Glucose-Capillary: 179 mg/dL — ABNORMAL HIGH (ref 70–99)
Glucose-Capillary: 220 mg/dL — ABNORMAL HIGH (ref 70–99)
Glucose-Capillary: 91 mg/dL (ref 70–99)

## 2019-08-16 NOTE — Plan of Care (Signed)
  Problem: Education: Goal: Knowledge of disease or condition will improve Outcome: Progressing   Problem: Nutrition: Goal: Risk of aspiration will decrease Outcome: Progressing   Problem: Pain Managment: Goal: General experience of comfort will improve Outcome: Progressing

## 2019-08-16 NOTE — Progress Notes (Signed)
INTERVAL HISTORY  No family at bedside.  He is lying comfortably in bed.  Patient is sleepy but arousable.  Has not have stool for the last 2 days, DC Imodium.   Vitals:   08/15/19 2313 08/15/19 2324 08/16/19 0332 08/16/19 0430  BP:  (!) 144/82  (!) 147/83  Pulse: (!) 101 (!) 102 90 97  Resp: 18 18 18 18   Temp:  98.4 F (36.9 C)  97.9 F (36.6 C)  TempSrc:  Oral  Oral  SpO2: 92% 94% 91% 94%  Weight:      Height:       CBC:  Recent Labs  Lab 08/12/19 0422 08/14/19 0520  WBC 4.8 5.6  HGB 13.2 13.6  HCT 41.4 42.5  MCV 95.0 95.3  PLT 247 230   Basic Metabolic Panel:  Recent Labs  Lab 08/12/19 0422 08/14/19 0520  NA 138 135  K 4.4 4.3  CL 98 98  CO2 31 30  GLUCOSE 99 93  BUN 17 19  CREATININE 0.65 0.62  CALCIUM 9.2 9.4    IMAGING past 24 hours No results found.   PHYSICAL EXAM    General- middle-aged Caucasian male, s/p tracheostomy on trach collar, sleeping but arousable.   Ophthalmologic- fundi not visualized due to noncooperation.  Cardiovascular - Regular rate and rhythm.  Neuro - s/p tracheostomy on trach collar. Initially sleepy but arousable, global aphasia, left gaze preference position, barely cross midline, notblinking to visual threat on the left, PERRL. Right facial droop. Tongue protrusion not cooperative. LUE spontaneous movement at least 4/5, LLE against gravity at least 3/5, right UE and LE flaccid still and does not seem to be developing rigidity or spasticity at this time, slight 1/5 movement with pain at RLE no withdrawal to pain in upper. Sensation, coordination and gait not tested. Not cooperative this morning.    ASSESSMENT/PLAN [name] with history of COPD, ongoing tobacco use, and psoriasis, presenting with dense LMCA syndrome. Attempt for Left ICA and Left M1 thrombectomy was done emergently with TICI 2b flow. Unfortunately, the pt had post procedure hemorrhage and reocclusion of the vessels. F/u  imaging suggested early edema and pt underwent prophylactic decompressive hemicraniectomy.  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 Stroke etiology unknown - given DVT requiring eventual AC and high mRS, will not do further embolic workup  Eliquis for VTE prophylaxis  No antithrombotic prior to admission, now on Eliquis.  Therapy recommendations:  SNF   Disposition:  Pending - LTACH consulted however no insurance - await bed  Replace flap prior to d/c to SNF. Reach out to Dr. 44 to replace once bed located. Likely need to wait until after 2 mos.  Respiratory failure s/p Trach Trach wound  Downsized to cuffless trach 6 by CCM 5/24  On trach collar  Bilateral Lower Ext DVT, extensive Possible PE given tachycardia  Venous 6/24 07/11/2019 DVT + bilateral extensive  IVC filter placed 5/9  On eliquis now for DVT treatment and prophylaxis  Dysphagia   NPO  PEG placement 5/14  FW 150 Q4h   On bolus TF  Tobacco abuse  Current smoker  Smoking cessation counseling will be provided if able  Other Stroke Risk  Factors  Obesity, Body mass index is 27.48 kg/m., recommend weight loss, diet and exercise as appropriate    Likely undiagnosed obstructive sleep apnea based on body habitus  Other Active Problems  Elevated LFT - AST/ALT 54/102, improved    Constipation. Miralax daily -> loose stool -> miralax discontinued -> add imodium -> d/c imodium  Mild anemia - Hb - 12.3.....13.6 - stable  Concern for elevated ST 6/8 on tele. EKG ok. Troponin neg. Will d/c tele. Pt not a candidate for cardiac intervention. On AC.  Hospital day # 83   Rosalin Hawking, MD PhD Stroke Neurology 08/16/2019 6:19 PM   To contact Stroke  Continuity provider, please refer to [email or web address] After hours, contact General Neurology

## 2019-08-17 LAB — GLUCOSE, CAPILLARY
Glucose-Capillary: 105 mg/dL — ABNORMAL HIGH (ref 70–99)
Glucose-Capillary: 111 mg/dL — ABNORMAL HIGH (ref 70–99)
Glucose-Capillary: 121 mg/dL — ABNORMAL HIGH (ref 70–99)
Glucose-Capillary: 203 mg/dL — ABNORMAL HIGH (ref 70–99)
Glucose-Capillary: 205 mg/dL — ABNORMAL HIGH (ref 70–99)
Glucose-Capillary: 93 mg/dL (ref 70–99)
Glucose-Capillary: 99 mg/dL (ref 70–99)

## 2019-08-17 LAB — CBC
HCT: 42.5 % (ref 39.0–52.0)
Hemoglobin: 13.4 g/dL (ref 13.0–17.0)
MCH: 30.3 pg (ref 26.0–34.0)
MCHC: 31.5 g/dL (ref 30.0–36.0)
MCV: 96.2 fL (ref 80.0–100.0)
Platelets: 201 10*3/uL (ref 150–400)
RBC: 4.42 MIL/uL (ref 4.22–5.81)
RDW: 16.7 % — ABNORMAL HIGH (ref 11.5–15.5)
WBC: 6.5 10*3/uL (ref 4.0–10.5)
nRBC: 0 % (ref 0.0–0.2)

## 2019-08-17 LAB — BASIC METABOLIC PANEL
Anion gap: 11 (ref 5–15)
BUN: 20 mg/dL (ref 6–20)
CO2: 27 mmol/L (ref 22–32)
Calcium: 9.3 mg/dL (ref 8.9–10.3)
Chloride: 99 mmol/L (ref 98–111)
Creatinine, Ser: 0.67 mg/dL (ref 0.61–1.24)
GFR calc Af Amer: >60 mL/min (ref >60)
GFR calc non Af Amer: >60 mL/min (ref >60)
Glucose, Bld: 105 mg/dL — ABNORMAL HIGH (ref 70–99)
Potassium: 4.3 mmol/L (ref 3.5–5.1)
Sodium: 137 mmol/L (ref 135–145)

## 2019-08-17 NOTE — Consult Note (Signed)
WOC Nurse wound follow up Patient receiving care in [facility] 3w07. Wound type: Trach faceplate wound resolved. Measurement: Wound bed: Drainage (amount, consistency, odor)  Periwound: Dressing procedure/placement/frequency: Helmut Muster, RN, MSN, CWOCN, CNS-BC, pager [phone]

## 2019-08-17 NOTE — Plan of Care (Signed)

## 2019-08-17 NOTE — Progress Notes (Signed)
Nutrition Follow-up  DOCUMENTATION CODES:   Obesity unspecified  INTERVENTION:  Continuebolus feeding via PEG: -2 cans Osmolite 1.5 cal (476m) TID  -366mPro-stat TID -15087mree water Q4H  Tube feeding regimen will provide 2430 kcals, 134 grams of protein, 1086m19mee water (1986ml38mal free water with flushes)   Continue1 packet Juven BIDvia PEG, each packet provides 95 calories, 2.5 grams of protein (collagen), and 9.8 grams of carbohydrate (3 grams sugar); also contains 7 grams of L-arginine and L-glutamine, 300 mg vitamin C, 15 mg vitamin E, 1.2 mcg vitamin B-12, 9.5 mg zinc, 200 mg calcium, and 1.5 g Calcium Beta-hydroxy-Beta-methylbutyrate to support wound healing   NUTRITION DIAGNOSIS:   Inadequate oral intake related to inability to eat as evidenced by NPO status.  Ongoing.  GOAL:   Patient will meet greater than or equal to 90% of their needs  Met with TF.   MONITOR:   TF tolerance  REASON FOR ASSESSMENT:   Consult, Ventilator Enteral/tube feeding initiation and management  ASSESSMENT:   Pt with PMH of smoking and COPD admitted with L ICA/MCA s/p IR for revascularization however pt had re-occlusion now s/p emergent L hemicraniectomy.  4/30 cortrak placed; tip gastric 5/2 TF held due to ileus 5/6 trickle TF started 5/8 trach placed; IVC filter placed 5/14 PEG placed 6/2 - s/p scalp wound revision  Discussed pt with RN. Pt sleeping at time of RD visit.   Current TF via PEG: 474ml 66mlite 1.5 cal TID, 30ml P32mtat TID, 150ml fr48mater Q4H, 1 packet Juven BID  Labs reviewed. CBGs [id number]: MVI  UOP: 2250ml x2483mrs I/O: -18510.5ml since25mmit  Diet Order:   Diet Order            Diet NPO time specified  Diet effective midnight                 EDUCATION NEEDS:   No education needs have been identified at this time  Skin:  Skin Assessment: Skin Integrity Issues: Skin Integrity Issues:: Stage III,  Incisions Stage III: R neck Incisions: head x2, R neck  Last BM:  6/13  Height:   Ht Readings from Last 1 Encounters:  07/22/19 6' (1.829 m)    Weight:   Wt Readings from Last 1 Encounters:  08/15/19 91.9 kg    Ideal Body Weight:  80.9 kg  BMI:  Body mass index is 27.48 kg/m.  Estimated Nutritional Needs:   Kcal:  2300-2500 [id number]  125-145 grams  Fluid:  2 L/day   Barclay Lennox AveLarkin InaLDN RD pager number and weekend/on-call pager number located in [email or web address]

## 2019-08-17 NOTE — Progress Notes (Signed)
INTERVAL HISTORY  No family at bedside.  Patient eyes open, awake alert, lying in bed, no neuro changes.  Pending SNF.  Vitals:   08/17/19 0821 08/17/19 1139 08/17/19 1241 08/17/19 1540  BP:   113/90 115/78  Pulse: 93 (!) 103 96 100  Resp: 18 18 18  (!) 22  Temp:   98.9 F (37.2 C) 98.5 F (36.9 C)  TempSrc:   Oral Oral  SpO2: 93% 92% 94% 95%  Weight:      Height:       CBC:  Recent Labs  Lab 08/14/19 0520 08/17/19 0454  WBC 5.6 6.5  HGB 13.6 13.4  HCT 42.5 42.5  MCV 95.3 96.2  PLT 230 160   Basic Metabolic Panel:  Recent Labs  Lab 08/14/19 0520 08/17/19 0454  NA 135 137  K 4.3 4.3  CL 98 99  CO2 30 27  GLUCOSE 93 105*  BUN 19 20  CREATININE 0.62 0.67  CALCIUM 9.4 9.3    IMAGING past 24 hours No results found.   PHYSICAL EXAM  General- middle-aged Caucasian male, s/p tracheostomy on trach collar, sleeping but arousable.   Ophthalmologic- fundi not visualized due to noncooperation.  Cardiovascular - Regular rate and rhythm.  Neuro - s/p tracheostomy on trach collar. Initially sleepy but arousable, global aphasia, left gaze preference position, barely cross midline, notblinking to visual threat on the left, PERRL. Right facial droop. Tongue protrusion not cooperative. LUE spontaneous movement at least 4/5, LLE against gravity at least 3/5, right UE and LE flaccid still and does not seem to be developing rigidity or spasticity at this time, slight 1/5 movement with pain at RLE no withdrawal to pain in upper. Sensation, coordination and gait not tested. Not cooperative this morning.    ASSESSMENT/PLAN [name] with history of COPD, ongoing tobacco use, and psoriasis, presenting with dense LMCA syndrome. Attempt for Left ICA and Left M1 thrombectomy was done emergently with TICI 2b flow. Unfortunately, the pt had post procedure hemorrhage and reocclusion of the vessels. F/u imaging suggested early edema and pt underwent  prophylactic decompressive hemicraniectomy.  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 Stroke etiology unknown - given DVT requiring eventual AC and high mRS, will not do further embolic workup  Eliquis for VTE prophylaxis  No antithrombotic prior to admission, now on Eliquis.  Therapy recommendations:  SNF   Disposition:  Pending - LTACH consulted however no insurance. CIR consulted however has no one to provide care afterwards - await SNF bed  Replace flap prior to d/c to SNF. Reach out to Dr. Christella Noa to replace once bed located. Likely need to wait until after 2 mos for replacement.  Respiratory failure s/p Trach Trach wound  Downsized to cuffless trach 6 by CCM 5/24  On trach collar  Bilateral Lower Ext DVT, extensive Possible PE given tachycardia  Venous US 07/11/2019 DVT + bilateral extensive  IVC filter placed 5/9  On eliquis now for DVT treatment and prophylaxis  Dysphagia   NPO  PEG placement 5/14  FW 150 Q4h   On bolus TF  Tobacco abuse  Current smoker  Smoking cessation counseling will be provided if able  Other Stroke Risk Factors  Obesity, Body mass index is 27.48 kg/m., recommend weight loss, diet and exercise as appropriate    Likely undiagnosed obstructive sleep apnea based on body habitus  Other Active Problems  Elevated LFT - AST/ALT 54/102, improved    Constipation. Miralax daily -> loose stool -> miralax discontinued -> add imodium -> d/c imodium  Hospital day # 52   Marvel Plan, MD PhD Stroke Neurology 08/17/2019 3:50 PM   To contact Stroke Continuity provider, please refer to [email or web address]. After hours, contact General Neurology

## 2019-08-17 NOTE — Progress Notes (Signed)
Inpatient Rehabilitation-Admissions Coordinator   Spoke with pt's wife, [name], via phone this AM as follow up from previous CIR conversation with my coworker Vernona Rieger. Per [name], the patient will not have the necessary assistance at DC to support an IP Rehab stay. She is open to SNF placement. AC will sign off and will notify [facility] team.   Cheri Rous, OTR/L  Rehab Admissions Coordinator  [phone] 08/17/2019 11:51 AM

## 2019-08-17 NOTE — Progress Notes (Signed)
Physical Therapy Treatment Patient Details Name: Lee Hooper MRN: [id number] DOB: [birth year] Today's Date: 08/17/2019    History of Present Illness This 49 y.o. male admitted 4/28 with Rt sided hemiplegia and aphasia.  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  Repeat CT following procedure showed moderate basal ganglia hemorrhage vs. stroke   Repeat MRI revealed that the ICA and MCA re-occluded with change in FLAIR sequence excluding him from repeat thrombectomy.  He underwent a decompressive hemicraniectomy.  He was intubated 4/28, trach 5/8; liberated from ventilator 5/14.  He developed an ileus 5/2.  5/9 IVC filter placed due to bil LE DVTs (extensive). PEG 5/14. PMH includes: COPD, tobacco abuse, psoriasis    PT Comments    Patient is making steady progress toward PT goals and tolerated session well. Pt requires mod A +2 for bed mobility and able to stand X 1 trial with max A +2. Pt able to maintain sitting balance EOB with L UE support and min guard for safety. PT will continue to follow acutely and progress as tolerated with anticipated d/c to post acute rehab.     Follow Up Recommendations  CIR     Equipment Recommendations  Wheelchair (measurements PT);Wheelchair cushion (measurements PT);Hospital bed    Recommendations for Other Services       Precautions / Restrictions Precautions Precautions: Fall Precaution Comments: R hemi, trach (has PMSV), PEG, crani-No Bone flap L head; rt shoulder pain/subluxation; bil LE DVTs with IVC filter    Mobility  Bed Mobility Overal bed mobility: Needs Assistance Bed Mobility: Sidelying to Sit;Sit to Sidelying   Sidelying to sit: +2 for physical assistance;HOB elevated;Mod assist     Sit to sidelying: +2 for physical assistance;HOB elevated;Max assist General bed mobility comments: pt able to use bed rail with hand over hand assist to reach toward right side; assistance required to bring R LE/hips to EOB and then to  elevate trunk into sitting   Transfers Overall transfer level: Needs assistance Equipment used: 2 person hand held assist Transfers: Sit to/from Stand Sit to Stand: Max assist;+2 physical assistance         General transfer comment: pt able to partial stand with R LE blocked, R UE supported by therapist, and with use of bed pad to facilitate hip extension  Ambulation/Gait                 Stairs             Wheelchair Mobility    Modified Rankin (Stroke Patients Only) Modified Rankin (Stroke Patients Only) Pre-Morbid Rankin Score: No symptoms Modified Rankin: Severe disability     Balance Overall balance assessment: Needs assistance Sitting-balance support: Feet supported;Single extremity supported Sitting balance-Leahy Scale: Poor Sitting balance - Comments: pt able to maintain sitting balance EOB with min guard assist for safety with pt holding onto foot board with L UE  Postural control: Right lateral lean (if not holding self up with L UE) Standing balance support: During functional activity Standing balance-Leahy Scale: Zero                              Cognition Arousal/Alertness: Awake/alert Behavior During Therapy: Flat affect Overall Cognitive Status: Impaired/Different from baseline Area of Impairment: Attention;Following commands;Safety/judgement;Problem solving;Awareness                   Current Attention Level: Sustained   Following Commands: Follows one  step commands with increased time;Follows one step commands inconsistently Safety/Judgement: Decreased awareness of safety;Decreased awareness of deficits Awareness: Intellectual Problem Solving: Slow processing;Requires verbal cues;Requires tactile cues;Decreased initiation        Exercises      General Comments        Pertinent Vitals/Pain Pain Assessment: Faces Faces Pain Scale: Hurts a little bit Pain Location: during bed mobility Pain Descriptors /  Indicators: Grimacing Pain Intervention(s): Limited activity within patient's tolerance;Monitored during session;Repositioned    Home Living                      Prior Function            PT Goals (current goals can now be found in the care plan section) Progress towards PT goals: Progressing toward goals    Frequency    Min 3X/week      PT Plan Current plan remains appropriate    Co-evaluation              AM-PAC PT "6 Clicks" Mobility   Outcome Measure  Help needed turning from your back to your side while in a flat bed without using bedrails?: A Little Help needed moving from lying on your back to sitting on the side of a flat bed without using bedrails?: A Lot Help needed moving to and from a bed to a chair (including a wheelchair)?: Total Help needed standing up from a chair using your arms (e.g., wheelchair or bedside chair)?: Total Help needed to walk in hospital room?: Total Help needed climbing 3-5 steps with a railing? : Total 6 Click Score: 9    End of Session Equipment Utilized During Treatment: Oxygen Activity Tolerance: Patient tolerated treatment well Patient left: in bed;with call bell/phone within reach;with bed alarm set;Other (comment) (bed in chair position) Nurse Communication: Mobility status;Need for lift equipment PT Visit Diagnosis: Other abnormalities of gait and mobility (R26.89);Hemiplegia and hemiparesis Hemiplegia - Right/Left: Right Hemiplegia - dominant/non-dominant: Dominant Hemiplegia - caused by: Nontraumatic intracerebral hemorrhage     Time: 1313-1335 PT Time Calculation (min) (ACUTE ONLY): 22 min  Charges:  $Therapeutic Activity: 8-22 mins                     Earney Navy, PTA Acute Rehabilitation Services Pager: [phone] Office: [phone]     Darliss Cheney 08/17/2019, 3:09 PM

## 2019-08-17 NOTE — Progress Notes (Signed)
BP 115/78 (BP Location: Left Arm)   Pulse 97   Temp 98.5 F (36.9 C) (Oral)   Resp 18   Ht 6' (1.829 m)   Wt 91.9 kg   SpO2 94%   BMI 27.48 kg/m  Will speak with wife about scheduling cranioplasty Slight improvement neurologically Wound is clean, dry, no signs of infection

## 2019-08-18 LAB — GLUCOSE, CAPILLARY
Glucose-Capillary: 98 mg/dL (ref 70–99)
Glucose-Capillary: 96 mg/dL (ref 70–99)
Glucose-Capillary: 155 mg/dL — ABNORMAL HIGH (ref 70–99)
Glucose-Capillary: 84 mg/dL (ref 70–99)
Glucose-Capillary: 157 mg/dL — ABNORMAL HIGH (ref 70–99)

## 2019-08-18 NOTE — Progress Notes (Signed)
INTERVAL HISTORY  No family at bedside. No neuro changes. NSG Dr. Franky Hooper will schedule for cranioplasty.    Vitals:   08/18/19 1111 08/18/19 1214 08/18/19 1517 08/18/19 1527  BP:  106/79  109/72  Pulse: 99 (!) 110 97 100  Resp: 20 20 (!) 22 18  Temp:  98.6 F (37 C)  98.9 F (37.2 C)  TempSrc:  Oral  Oral  SpO2: 93% 93% 93% 93%  Weight:      Height:       CBC:  Recent Labs  Lab 08/14/19 0520 08/17/19 0454  WBC 5.6 6.5  HGB 13.6 13.4  HCT 42.5 42.5  MCV 95.3 96.2  PLT 230 201   Basic Metabolic Panel:  Recent Labs  Lab 08/14/19 0520 08/17/19 0454  NA 135 137  K 4.3 4.3  CL 98 99  CO2 30 27  GLUCOSE 93 105*  BUN 19 20  CREATININE 0.62 0.67  CALCIUM 9.4 9.3    IMAGING past 24 hours No results found.   PHYSICAL EXAM   General- middle-aged Caucasian male, s/p tracheostomy on trach collar, sleeping but arousable.   Ophthalmologic- fundi not visualized due to noncooperation.  Cardiovascular - Regular rate and rhythm.  Neuro - s/p tracheostomy on trach collar. Initially sleepy but arousable, global aphasia, left gaze preference position, barely cross midline, notblinking to visual threat on the left, PERRL. Right facial droop. Tongue protrusion not cooperative. LUE spontaneous movement at least 4/5, LLE against gravity at least 3/5, right UE and LE flaccid still and does not seem to be developing rigidity or spasticity at this time, slight 1/5 movement with pain at RLE no withdrawal to pain in upper. Sensation, coordination and gait not tested. Not cooperative this morning.    ASSESSMENT/PLAN [name] with history of COPD, ongoing tobacco use, and psoriasis, presenting with dense LMCA syndrome. Attempt for Left ICA and Left M1 thrombectomy was done emergently with TICI 2b flow. Unfortunately, the pt had post procedure hemorrhage and reocclusion of the vessels. F/u imaging suggested early edema and pt underwent prophylactic  decompressive hemicraniectomy.  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 Stroke etiology unknown - given DVT requiring eventual AC and high mRS, will not do further embolic workup  Eliquis for VTE prophylaxis  No antithrombotic prior to admission, now on Eliquis.  Therapy recommendations:  SNF   Disposition:  Pending - LTACH consulted however no insurance. CIR consulted however has no one to provide care afterwards - await SNF bed  Dr. Franky Hooper to schedule for cranioplasty.   Respiratory failure s/p Trach Trach wound  Downsized to cuffless trach 6 by CCM 5/24  On trach collar  Bilateral Lower Ext DVT, extensive Possible PE given tachycardia  Venous US 07/11/2019 DVT + bilateral extensive  IVC filter placed 5/9  On eliquis now for DVT treatment and prophylaxis  Dysphagia   NPO  PEG placement 5/14  FW 150 Q4h   On bolus TF  Tobacco abuse  Current smoker  Smoking cessation counseling will be provided if able  Other Stroke Risk Factors  Obesity, Body mass index is 27.15 kg/m., recommend weight loss, diet and exercise as appropriate    Likely undiagnosed obstructive sleep  apnea based on body habitus  Other Active Problems  Elevated LFT - AST/ALT 54/102, improved    Constipation. Miralax daily -> loose stool -> miralax discontinued -> add imodium -> d/c [facility] day # 48   Rosalin Hawking, MD PhD Stroke Neurology 08/18/2019 4:35 PM   To contact Stroke Continuity provider, please refer to [email or web address] After hours, contact General Neurology

## 2019-08-18 NOTE — Plan of Care (Signed)
  Problem: Nutrition: Goal: Risk of aspiration will decrease Outcome: Progressing   Problem: Clinical Measurements: Goal: Diagnostic test results will improve Outcome: Progressing

## 2019-08-18 NOTE — Plan of Care (Signed)

## 2019-08-18 NOTE — Progress Notes (Signed)
Patient ID: Lee Hooper, male   DOB: 04-28-1970, 49 y.o.   MRN: [id number] BP 109/72 (BP Location: Left Arm)   Pulse 100   Temp 98.9 F (37.2 C) (Oral)   Resp 18   Ht 6' (1.829 m)   Wt 90.8 kg   SpO2 93%   BMI 27.15 kg/m  Alert Following commands Will schedule for cranioplasty

## 2019-08-19 LAB — CBC
HCT: 45 % (ref 39.0–52.0)
Hemoglobin: 14.3 g/dL (ref 13.0–17.0)
MCH: 30.2 pg (ref 26.0–34.0)
MCHC: 31.8 g/dL (ref 30.0–36.0)
MCV: 94.9 fL (ref 80.0–100.0)
Platelets: 191 10*3/uL (ref 150–400)
RBC: 4.74 MIL/uL (ref 4.22–5.81)
RDW: 16.5 % — ABNORMAL HIGH (ref 11.5–15.5)
WBC: 6.3 10*3/uL (ref 4.0–10.5)
nRBC: 0 % (ref 0.0–0.2)

## 2019-08-19 LAB — BASIC METABOLIC PANEL
Anion gap: 9 (ref 5–15)
BUN: 20 mg/dL (ref 6–20)
CO2: 26 mmol/L (ref 22–32)
Calcium: 9.6 mg/dL (ref 8.9–10.3)
Chloride: 102 mmol/L (ref 98–111)
Creatinine, Ser: 0.6 mg/dL — ABNORMAL LOW (ref 0.61–1.24)
GFR calc Af Amer: >60 mL/min (ref >60)
GFR calc non Af Amer: >60 mL/min (ref >60)
Glucose, Bld: 101 mg/dL — ABNORMAL HIGH (ref 70–99)
Potassium: 4.2 mmol/L (ref 3.5–5.1)
Sodium: 137 mmol/L (ref 135–145)

## 2019-08-19 LAB — GLUCOSE, CAPILLARY
Glucose-Capillary: 106 mg/dL — ABNORMAL HIGH (ref 70–99)
Glucose-Capillary: 166 mg/dL — ABNORMAL HIGH (ref 70–99)
Glucose-Capillary: 185 mg/dL — ABNORMAL HIGH (ref 70–99)
Glucose-Capillary: 217 mg/dL — ABNORMAL HIGH (ref 70–99)
Glucose-Capillary: 85 mg/dL (ref 70–99)
Glucose-Capillary: 93 mg/dL (ref 70–99)

## 2019-08-19 MED ORDER — INSULIN ASPART 100 UNIT/ML ~~LOC~~ SOLN
0.0000 [IU] | Freq: Three times a day (TID) | SUBCUTANEOUS | Status: DC
  Administered 2019-08-19: 3 [IU] via SUBCUTANEOUS
  Administered 2019-08-20 – 2019-08-21 (×2): 1 [IU] via SUBCUTANEOUS
  Administered 2019-08-22: 2 [IU] via SUBCUTANEOUS
  Administered 2019-08-22: 1 [IU] via SUBCUTANEOUS
  Administered 2019-08-23: 2 [IU] via SUBCUTANEOUS
  Administered 2019-08-23 – 2019-08-25 (×2): 1 [IU] via SUBCUTANEOUS
  Administered 2019-08-25 – 2019-08-26 (×2): 2 [IU] via SUBCUTANEOUS
  Administered 2019-08-27: 1 [IU] via SUBCUTANEOUS
  Administered 2019-08-28: 2 [IU] via SUBCUTANEOUS

## 2019-08-19 NOTE — Progress Notes (Signed)
NAME:  Lee Hooper, MRN:  [id number], DOB:  25-Nov-1970, LOS: 49 ADMISSION DATE:  07/01/2019, CONSULTATION DATE:  07/01/19 REFERRING MD:  Aroor MD, CHIEF COMPLAINT:  CVA    Brief History   Lee Hooper is a 49 yo M who presented as a code stroke after being found aphasic and nonverbal by his wife. CT/CTA studies showed left ICA/MCA thrombosis and pt was taken for IR thrombectomy for left ICA and left M1 occlusions. Repeat head CT was obtained following the procedure out of concern for possible hemorrhage and showed moderate basal ganglia hemorrhage vs stroke. Repeat MRI revealed that the ICA and MCA re-occluded with change in FLAIR sequence excluding him from repeat thrombectomy. He underwent a L hemicraniectomy. He was intubated for airway protection.  Past Medical History  Psoriasis  COPD  Tobacco Use  Significant Hospital Events   4/28: Admit; L ICA/MCA Thrombectomy with re-occlusion --> left hemicraniectomy, intubated for acute hypoxic respiratory failure  5/3: NAE. Was taken off precedex yesterday and pt opened his eyes. Prednisone 50 mg qd was begun. He had a persistent fever around 38.2, HR 110s, BP 140s/60s. Pt required increased O2 with secretions; CXR showed mild edema with small effusions c/f aspiration vs opacification. Blood culture x2 and urine culture collected, NGTD. ABG pending. Currently satting 92% on PRVC FiO2 80, PEEP 10, Plateau 22. WBC declined to 10.5 from 11.1, Na 151 from 156. Tube feeds were held d/t distended colonic loop in RLQ on KUB, to be continued today. Thrombocytopenia improved to 93 from 81.  5/4: Patient remains intubated with spontaneous movement of left arm and leg.  5/5: NAE. Pt remains intubated on PRN fetanyl. Will open eyes and spontaneously move left leg and arm.  5/6: NAE overnight. Intubated with no sedation. 5/7: NAE. Marland Kitchen Completed Unasyn yesterday. VSS, spO2 remains in low 90s the majority of the time on 620/40%/+5. Morning labs significant  for HCO3 of 19. Otherwise unremarkable.  5/8: Ongoing fever.  Obtained ultrasound, positive for lower extremity DVT bilaterally.  Tracheostomy placed at bedside.,  Did have some SVT this responded to treating fever and rate control. 5/9: IVC filter placed 5/11: no acute events overnight. Pt with similar exam as previous however tachycardic and febrile (tmax102.7) with a bit increased wbc and new R infiltrate noted in base. Pan cx and start empiric abx. 5/12 Tolerated 8 hours of trach collar  5/17 completed antibiotics, added IV Lasix, for ongoing high supplemental oxygen requirements.  Because of this we delayed transfer out 5/18 improved oxygenation, improved x-ray.  Weaning oxygen.  Transitioning to aerosol trach collar 5/18 to 5/24: making slow progress on I&O balance but oxygen requirements much improved. DOAC started 5/23 w/ no new issues. Will ask RT to change to cuffless.  Consults:  IR PCCM  Neuro (Primary) Neurosurgery  Procedures:  4/28 IR - L ICA/MCA Thrombectomy 4/28 L hemicraniectomy 4/28 Intubation 5/08 Trach   Significant Diagnostic Tests:  4/28 Admit CT Head Code Stroke:  Hyperdense left ICA and MCA compatible with acute thrombus. No acute infarct or hemorrhage.  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 The patient has minimal atherosclerotic disease. The left internal carotid artery is occluded through the terminus and extending in the left M1 M2 and M3 branches. There is poor collateral circulation on the left. There is a large territory infarct involving the left hemisphere. Infarct volume 266 mL.  4/28 Admit  MR Brain:  Restricted diffusion throughout much of the left MCA vascular territory consistent with acute ischemia. Restricted diffusion consistent with acute ischemia is also present within the paramedian left frontoparietal lobes, although this is less well assessed  due to the degree of motion degradation at the level of the vertex. There is little if any corresponding T2/FLAIR hyperintensity at these sites. No significant mass effect. No midline shift.  4/28 Post Thrombectomy CT Head: : 1. 3.0 x 2.6 x 3.9 cm region of hyperdensity centered within the left basal ganglia, left subinsular region and inferomedial left temporal lobe likely reflecting a combination of parenchymal hematoma and contrast staining.  2. Edema with loss of gray-white differentiation within the left basal ganglia, left insula and anterior left temporal lobe, likely acute infarction. Subtle changes of acute infarction are also suspected within the paramedian left frontal lobe ACA vascular territory. 3. Scattered small-volume subarachnoid hemorrhage along the left cerebral hemisphere. 4. Regional mass effect with effacement of the left lateral ventricle temporal horn. No midline shift.  4/28 Post Thrombectomy MR Brain:  1. Restricted diffusion throughout the majority of the left MCA and ACA vascular territories consistent with acute ischemia. 2. No significant mass effect at this time. No midline shift. 3. The acute parenchymal hemorrhage and/or contrast staining centered within left basal ganglia and inferomedial left temporal lobe on prior head CT does not appear significantly changed. Based on the MR appearance, it is suspected that a significant component of this previously demonstrated hyperdensity reflects contrast staining. 4. Redemonstrated small volume subarachnoid hemorrhage overlying the left cerebral hemisphere. Small volume subarachnoid hemorrhage is also questioned along the right cerebral hemisphere posteriorly.  4/29 Post Hemicraniectomy CT head:  Complete left ACA/MCA territory infarction with swollen brain bulging through craniectomy defect. No midline shift or entrapment. Petechial hemorrhage at left basal ganglia. Extraaxial hemorrhage along surface of the infarct.    5/1 Head CT > Slightly increased rightward midline shift with a small amount of herniation beneath the anterior falx. Otherwise unchanged Examination.  5/6 Head CT: Left ACA and MCA territory infarct with unchanged degree of subarachnoid hemorrhage. Swelling has progressed with greater herniation through the craniectomy defect and 5 mm of midline shift.  5/8: Bilateral acute DVT involving the right common femoral vein right proximal profundal vein and right popliteal vein also left DVT involving same vessels  5/12 Renal US: Technically challenging exam due to patient's intubated status and difficulty with repositioning.  Diffusely increased cortical echogenicity in both kidneys compatible with medical renal disease.  Increased echogenicity of the liver with diminished through transmission, most often reflective of hepatic steatosis.  5/20 Ct H> significant motion artifact. Continued low density and swelling of L hemisphere in L ACA and MCA idstributions. Areas of petechial hemorrhage, no large hematoma. L craniectomy w bulging of brain through defect. Reduced mass effect with L to R shift now 76mm from 24mm previously  Micro Data:  4/28: SARS-CoV-2/Influenza A/Influenza B PCR: Negative  4/30: Respiratory culture: Abundant Moraxella Catarrhalis, Haemophilus Influenzae, beta lactamase positive  5/2: Urine culture: NGTD  5/2: BC x2: NGTD 5/7 resp >> H flu 5/7 bld >> negative 5/11 BCx2 >> negative 5/11 trach asp >> E coli  Antimicrobials:  4/30 - 5/2: Cefepime 5/2 - 5/4: Augmentin  5/5 - 5/6 unasyn   Vanc 5/11 >> 5/13 Cefepime 5/11 >> 5/13 Ancef 5/14 >> 5/17  Interim history/subjective:   No issues overnight. Resting comfortably in bed with tracheostomy.  Objective   Blood pressure 105/76, pulse  88, temperature 98.5 F (36.9 C), temperature source Oral, resp. rate 20, height 6' (1.829 m), weight 90.8 kg, SpO2 94 %.    FiO2 (%):  [21 %] 21 %   Intake/Output Summary (Last 24  hours) at 08/19/2019 0905 Last data filed at 08/19/2019 0529 Gross per 24 hour  Intake 650 ml  Output 1200 ml  Net -550 ml   Filed Weights   08/14/19 0327 08/15/19 0145 08/18/19 0414  Weight: 92.5 kg 91.9 kg 90.8 kg    Examination: General: Middle-aged male working w PT HEENT: 6 cuffless trach in place, small ulceration starting to healed just below the insertion site and anterior cusp of the ostomy Neuro: moves on the L CV: regular, no M PULM: clear GI: non-distended Extremities: no edema Skin: pressure dressing in place sacrum   Resolved Hospital Problem list   treated HCAP with Moraxella and Haemophilus influenza, these were beta-lactamase positive, antibiotics were completed 5/6; COPD exacerbation. Ileus, AKI, hypernatremia Completed E. coli HCAP treatment on 5/18  Assessment & Plan:   Acute hypoxic respiratory failure with compromised airway. Failure to wean s/p tracheostomy. Hx of smoking with presumed COPD. Large Lt ICA/MCA CVA s/p thrombectomy and decompressive hemicraniectomy 2nd to cerebral edema and hemorrhage. Mild hypernatremia-->improved w/ Free water replacement  B/l lower extremity DVT.- s/p IVC filter 5/09, now on [facility] SVT.  Plan Continue trach collar ad lib, cuffless #6 Suction prn. Still requiring 4x a day - not clear that he will achieve a position to consider decannulation. Will need better secretion management, continued work with SLP for PMV, hopefully one day capping although not in current state.  Note plans for cranioplasty by Dr Christella Noa  Best practice:  Diet: EN  DVT prophylaxis:  IVC filter 5/9, anticoag w Eliquis GI prophylaxis: Protonix Mobility: PT/ OT Code Status: FULL Disposition: neuro floor > ? To rehab or SNF   Baltazar Apo, MD, PhD 08/19/2019, 9:10 AM [facility] [phone] or if no answer [phone]

## 2019-08-19 NOTE — Progress Notes (Signed)
Occupational Therapy Treatment Patient Details Name: Lee Hooper MRN: [id number] DOB: 08-21-1970 Today's Date: 08/19/2019    History of present illness This 49 y.o. male admitted 4/28 with Rt sided hemiplegia and aphasia.  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  Repeat CT following procedure showed moderate basal ganglia hemorrhage vs. stroke   Repeat MRI revealed that the ICA and MCA re-occluded with change in FLAIR sequence excluding him from repeat thrombectomy.  He underwent a decompressive hemicraniectomy.  He was intubated 4/28, trach 5/8; liberated from ventilator 5/14.  He developed an ileus 5/2.  5/9 IVC filter placed due to bil LE DVTs (extensive). PEG 5/14. PMH includes: COPD, tobacco abuse, psoriasis   OT comments  Pt seen in conjunction with PT to optimize pts tolerance and participation. Pt able to progress to OOB this session using stedy. Pt required MAX A +2 for bed mobility with pt exiting bed to pts L side; MAX A +2 for sit<>stand to stedy. Pt required minguard for sitting balance EOB with LUE supported, however pt required MOD- MAX A for unsupported sitting. Agree with DC plan below, will follow acutely for OT needs.    Follow Up Recommendations  CIR    Equipment Recommendations  Other (comment) (defer to next venue of care)    Recommendations for Other Services      Precautions / Restrictions Precautions Precautions: Fall Precaution Comments: R hemi, trach (has PMSV), PEG, crani-No Bone flap L head; rt shoulder pain/subluxation; bil LE DVTs with IVC filter Restrictions Weight Bearing Restrictions: No       Mobility Bed Mobility Overal bed mobility: Needs Assistance Bed Mobility: Rolling;Supine to Sit Rolling: Min assist (Roll R; reaching with LUE)   Supine to sit: +2 for physical assistance;Max assist;HOB elevated     General bed mobility comments: exited bed to pts L side; pt with good initiation of progressing LLE to EOB. required  assist to manage RLE and RUE but able to push through L elbow to elevate trunk  Transfers Overall transfer level: Needs assistance Equipment used: Ambulation equipment used Transfers: Sit to/from [employer] Sit to Stand: Max assist;+2 physical assistance Stand pivot transfers: Total assist (stedy)       General transfer comment: pt able to power up into standing to stedy with MAX A +2. pt able to use LUE to pull up needing assist to manage RUE on stedy. Pt able to stand ~ 1 min in stedy needing MOD A on R side for standing balance    Balance Overall balance assessment: Needs assistance Sitting-balance support: Feet supported;Single extremity supported Sitting balance-Leahy Scale: Poor Sitting balance - Comments: pt able to maintain sitting balance EOB with min guard assist for safety with pt holding onto bed rail with L UE, however once in stedy pt requried MOD- MAX A for sitting balance leaning laterally and R Postural control: Right lateral lean Standing balance support: Single extremity supported Standing balance-Leahy Scale: Poor Standing balance comment: reliant on external assist                           ADL either performed or assessed with clinical judgement   ADL Overall ADL's : Needs assistance/impaired     Grooming: Wash/dry face;Sitting;Set up;Minimal assistance Grooming Details (indicate cue type and reason): pt able to wash face from recliner with set- up and MIN A for cleanliness  Toilet Transfer: Maximal assistance;+2 for physical assistance;+2 for safety/equipment;Total assistance Toilet Transfer Details (indicate cue type and reason): simulated via functional mobility with stedy; MAX A +2 to stand to stedy; total A for transfer         Functional mobility during ADLs: Maximal assistance;+2 for physical assistance;+2 for safety/equipment (sit<>stand to stedy) General ADL Comments: pt able to progress to OOB  this session needing MAX A +2 to stand to stedy     Vision       Perception     Praxis      Cognition Arousal/Alertness: Awake/alert Behavior During Therapy: Flat affect Overall Cognitive Status: Impaired/Different from baseline Area of Impairment: Attention;Following commands;Problem solving;Awareness;Safety/judgement                   Current Attention Level: Sustained   Following Commands: Follows one step commands consistently Safety/Judgement: Decreased awareness of deficits;Decreased awareness of safety Awareness: Intellectual Problem Solving: Slow processing;Requires verbal cues;Requires tactile cues;Decreased initiation General Comments: flat overall during session, but does gesture with thumbs up        Exercises     Shoulder Instructions       General Comments pt on trach collar 28% FiO2 5LPM; HR increase to 140s during mobility with all other VSS    Pertinent Vitals/ Pain       Pain Assessment: Faces Faces Pain Scale: Hurts a little bit Pain Location: general discomfort with mobility Pain Descriptors / Indicators: Grimacing Pain Intervention(s): Monitored during session;Repositioned  Home Living                                          Prior Functioning/Environment              Frequency  Min 2X/week        Progress Toward Goals  OT Goals(current goals can now be found in the care plan section)  Progress towards OT goals: Progressing toward goals  Acute Rehab OT Goals Patient Stated Goal: nods head "yes" to sit at EOB OT Goal Formulation: Patient unable to participate in goal setting Time For Goal Achievement: 08/18/19 Potential to Achieve Goals: Fair  Plan Discharge plan remains appropriate;Frequency remains appropriate    Co-evaluation    PT/OT/SLP Co-Evaluation/Treatment: Yes Reason for Co-Treatment: Complexity of the patient's impairments (multi-system involvement);For patient/therapist safety;To  address functional/ADL transfers;Necessary to address cognition/behavior during functional activity   OT goals addressed during session: ADL's and self-care      AM-PAC OT "6 Clicks" Daily Activity     Outcome Measure   Help from another person eating meals?: Total Help from another person taking care of personal grooming?: A Lot Help from another person toileting, which includes using toliet, bedpan, or urinal?: Total Help from another person bathing (including washing, rinsing, drying)?: Total Help from another person to put on and taking off regular upper body clothing?: Total Help from another person to put on and taking off regular lower body clothing?: Total 6 Click Score: 7    End of Session Equipment Utilized During Treatment: Gait belt;Oxygen;Other (comment) (stedy; 28% FiO2 5LPM)  OT Visit Diagnosis: Unsteadiness on feet (R26.81);Cognitive communication deficit (R41.841);Hemiplegia and hemiparesis;Muscle weakness (generalized) (M62.81);Apraxia (R48.2);Other symptoms and signs involving cognitive function;Pain Symptoms and signs involving cognitive functions: Cerebral infarction Hemiplegia - Right/Left: Right Hemiplegia - dominant/non-dominant: Dominant Hemiplegia - caused by: Cerebral infarction Pain - Right/Left: Right   Activity  Tolerance Patient tolerated treatment well   Patient Left in chair;with call bell/phone within reach;with chair alarm set   Nurse Communication Mobility status;Other (comment) (sitting in chair; will return to assist pt back to bed)        Time: 3833-3832 OT Time Calculation (min): 38 min  Charges: OT General Charges $OT Visit: 1 Visit OT Treatments $Therapeutic Activity: 8-22 mins  Lanier Clam., COTA/L Acute Rehabilitation Services [phone]    Ihor Gully 08/19/2019, 11:32 AM

## 2019-08-19 NOTE — Progress Notes (Signed)
INTERVAL HISTORY  No family at bedside. No acute event overnight. Speech therapist to consider BMS. Discussed with Dr. Christella Noa, cranioplasty next week as earliest.    Vitals:   08/19/19 1430 08/19/19 1533 08/19/19 1537 08/19/19 1541  BP: 113/79   128/81  Pulse: (!) 105 (!) 120 (!) 120 (!) 118  Resp: 18 16 16 14   Temp: 98.2 F (36.8 C)   98.7 F (37.1 C)  TempSrc: Oral   Oral  SpO2: 93% 91% 91% 93%  Weight:      Height:       CBC:  Recent Labs  Lab 08/17/19 0454 08/19/19 0652  WBC 6.5 6.3  HGB 13.4 14.3  HCT 42.5 45.0  MCV 96.2 94.9  PLT 201 947   Basic Metabolic Panel:  Recent Labs  Lab 08/17/19 0454 08/19/19 0652  NA 137 137  K 4.3 4.2  CL 99 102  CO2 27 26  GLUCOSE 105* 101*  BUN 20 20  CREATININE 0.67 0.60*  CALCIUM 9.3 9.6    IMAGING past 24 hours No results found.   PHYSICAL EXAM General- middle-aged Caucasian male, s/p tracheostomy on trach collar, sleeping but arousable.   Ophthalmologic- fundi not visualized due to noncooperation.  Cardiovascular - Regular rate and rhythm.  Neuro - s/p tracheostomy on trach collar. Initially sleepy but arousable, global aphasia, left gaze preference position, barely cross midline, notblinking to visual threat on the left, PERRL. Right facial droop. Tongue protrusion not cooperative. LUE spontaneous movement at least 4/5, LLE against gravity at least 3/5, right UE and LE flaccid still and does not seem to be developing rigidity or spasticity at this time, slight 1/5 movement with pain at RLE no withdrawal to pain in upper. Sensation, coordination and gait not tested. Not cooperative this morning.    ASSESSMENT/PLAN [name] with history of COPD, ongoing tobacco use, and psoriasis, presenting with dense LMCA syndrome. Attempt for Left ICA and Left M1 thrombectomy was done emergently with TICI 2b flow. Unfortunately, the pt had post procedure hemorrhage and reocclusion of the  vessels. F/u imaging suggested early edema and pt underwent prophylactic decompressive hemicraniectomy.  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 Stroke etiology unknown - given DVT requiring eventual AC and high mRS, will not do further embolic workup  Eliquis for VTE prophylaxis  No antithrombotic prior to admission, now on Eliquis.  Therapy recommendations:  SNF   Disposition:  Pending - LTACH consulted however no insurance. CIR consulted however has no one to provide care afterwards - await SNF bed  Dr. Christella Noa  for cranioplasty, earliest next week.   Respiratory failure s/p Trach Trach wound  Downsized to cuffless trach 6 by CCM 5/24  On trach collar  Hyperglycemia  on TF  Added SSI  Bilateral Lower Ext DVT, extensive Possible PE given tachycardia  Venous US 07/11/2019 DVT + bilateral extensive  IVC filter placed 5/9  On eliquis now for DVT treatment and prophylaxis  Dysphagia   NPO  PEG placement 5/14  FW 150 Q4h   On bolus TF  Speech to consider MBS  Tobacco abuse  Current smoker  Smoking cessation counseling will be provided if able  Other Stroke Risk Factors  Obesity, Body mass index is 27.15 kg/m., recommend weight loss, diet and exercise as appropriate    Likely undiagnosed obstructive sleep apnea based on body habitus  Other Active Problems  Elevated LFT - AST/ALT 54/102, improved    Constipation. Miralax daily -> loose stool -> miralax discontinued -> add imodium -> d/c imodium  Hospital day # 56   Marvel Plan, MD PhD Stroke Neurology 08/19/2019 5:54 PM   To contact Stroke Continuity provider, please refer to [email or web address]. After hours, contact General Neurology

## 2019-08-19 NOTE — Progress Notes (Addendum)
CBG 217, Dr. Roda Shutters notified, received order for sliding scale

## 2019-08-19 NOTE — Progress Notes (Signed)
  Speech Language Pathology Treatment: Dysphagia;Cognitive-Linquistic  Patient Details Name: Lee Hooper MRN: [id number] DOB: 1970-10-06 Today's Date: 08/19/2019 Time: 7893-8101 SLP Time Calculation (min) (ACUTE ONLY): 35 min  Assessment / Plan / Recommendation Clinical Impression  Pts attention and cognition seem similar to last week. Pt has fleeting attention, at times he has impulse to communicate or initiate speech or a communicative gesture in meaningful context, but when he struggles, he looses focus. Was able to elicit some articulation today, not accurate, also pointing to pictures in a field of two with chance accuracy. Demanded Y/N response to questions with eye contact and model. Pt return demonstrated x2. Trials of PO continue to appear promising, but delayed cough warrants instrumental assessment. Will f/u for MBS when it is clear pt is not scheduled for bone flap replacement.   HPI HPI: This 49 y.o. male admitted 4/28 with Rt sided hemiplegia and aphasia.  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  Repeat CT following procedure showed moderate basal ganglia hemorrhage vs. stroke   Repeat MRI revealed that the ICA and MCA re-occluded with change in FLAIR sequence excluding him from repeat thrombectomy.  He underwent a decompressive hemicraniectomy.  He waws intubated 4/28.  He developed an ileus 5/2. Trach 5/8. IVC filter placed 5/9.  Peg placement.  PMH includes: COPD, tobacco abuse, psoriasis      SLP Plan  MBS       Recommendations                   Plan: MBS       GO               Harlon Ditty, MA CCC-SLP  Acute Rehabilitation Services Pager [phone] Office [phone]  Claudine Mouton 08/19/2019, 3:45 PM

## 2019-08-19 NOTE — Progress Notes (Signed)
Occupational Therapy Treatment Patient Details Name: Lee Hooper MRN: [id number] DOB: [birth year] Today's Date: 08/19/2019    History of present illness This 49 y.o. male admitted 4/28 with Rt sided hemiplegia and aphasia.  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  Repeat CT following procedure showed moderate basal ganglia hemorrhage vs. stroke   Repeat MRI revealed that the ICA and MCA re-occluded with change in FLAIR sequence excluding him from repeat thrombectomy.  He underwent a decompressive hemicraniectomy.  He was intubated 4/28, trach 5/8; liberated from ventilator 5/14.  He developed an ileus 5/2.  5/9 IVC filter placed due to bil LE DVTs (extensive). PEG 5/14. PMH includes: COPD, tobacco abuse, psoriasis   OT comments  Pt seen for second session with PT to facilitate safe return back to bed. Pt received in recliner fatigued from being OOB for the first time, however pt was able to stand to stedy with MAX A +2. Pt required assist to manage RUE on stedy but able to use LUE to pull up on stedy. Pt required total A +2 to return to supine from EOB. Pt left in chair position in bed with all needs within reach. Collaborated with OTR to update goals, will continue to follow acutely per POC.   Follow Up Recommendations  CIR    Equipment Recommendations  Other (comment) (defer to next venue of care)    Recommendations for Other Services      Precautions / Restrictions Precautions Precautions: Fall Precaution Comments: R hemi, trach (has PMSV), PEG, crani-No Bone flap L head; rt shoulder pain/subluxation; bil LE DVTs with IVC filter Restrictions Weight Bearing Restrictions: No       Mobility Bed Mobility Overal bed mobility: Needs Assistance Bed Mobility: Rolling;Sit to Sidelying Rolling: Min assist   Supine to sit: +2 for physical assistance;Max assist;HOB elevated   Sit to sidelying: Total assist;+2 for physical assistance General bed mobility comments: pt  required total A +2 to return to sidelying with pt able to roll to supine with MIN A  Transfers Overall transfer level: Needs assistance Equipment used: Ambulation equipment used Transfers: Sit to/from [employer] Sit to Stand: Max assist;+2 physical assistance Stand pivot transfers: Total assist (stedy)       General transfer comment: pt able to sit<>stand from recliner with MAX A +2 needing assist to manage RUE on stedy. good use of LUE on stedy to pull into standing    Balance Overall balance assessment: Needs assistance Sitting-balance support: Feet supported;Single extremity supported Sitting balance-Leahy Scale: Poor Sitting balance - Comments: MOD - MAX A for sitting balance EOB d/t fatigue Postural control: Right lateral lean Standing balance support: Single extremity supported Standing balance-Leahy Scale: Poor Standing balance comment: reliant on external assist                           ADL either performed or assessed with clinical judgement   ADL Overall ADL's : Needs assistance/impaired     Grooming: Wash/dry face;Sitting;Set up;Minimal assistance Grooming Details (indicate cue type and reason): pt able to wash face from recliner with set- up and MIN A for cleanliness                 Toilet Transfer: Maximal assistance;+2 for physical assistance;+2 for safety/equipment;Total assistance Toilet Transfer Details (indicate cue type and reason): simulated via functional mobility with stedy; MAX A +2 to stand to stedy; total A for transfer  Functional mobility during ADLs: Maximal assistance;+2 for physical assistance;+2 for safety/equipment (sit<>stand to stedy) General ADL Comments: returned for second sessiont to assist pt back to bed. Pt continues to present wtih decreased activity tolerance, R side weakness and inattention and cognitive deficits impacting pts ability to engage in BADl. pt required MAX A +2 for sit<>stand to  stedy and total A +2 to return to supine     Vision       Perception     Praxis      Cognition Arousal/Alertness: Awake/alert Behavior During Therapy: Flat affect Overall Cognitive Status: Impaired/Different from baseline Area of Impairment: Attention;Following commands;Problem solving;Awareness;Safety/judgement                   Current Attention Level: Sustained   Following Commands: Follows one step commands with increased time Safety/Judgement: Decreased awareness of deficits;Decreased awareness of safety Awareness: Intellectual Problem Solving: Slow processing;Requires verbal cues;Requires tactile cues;Decreased initiation General Comments: pt continues to be flat and more fatigued from sitting up in chair, however did gesture to stomach to indicate pain. Gave thumbs up at end of session        Exercises     Shoulder Instructions       General Comments pt on trach collar 28% FiO2 5LPM; HR increase to 140s during mobility with all other VSS    Pertinent Vitals/ Pain       Pain Assessment: Faces Faces Pain Scale: Hurts a little bit Pain Location: stomach (pt gesturing to stomach; however no irritation noted) Pain Descriptors / Indicators: Grimacing;Discomfort Pain Intervention(s): Monitored during session;Repositioned;Other (comment) (checked skin)  Home Living                                          Prior Functioning/Environment              Frequency  Min 2X/week        Progress Toward Goals  OT Goals(current goals can now be found in the care plan section)  Progress towards OT goals: Progressing toward goals  Acute Rehab OT Goals Patient Stated Goal: nods head "yes" to sit at EOB OT Goal Formulation: Patient unable to participate in goal setting Time For Goal Achievement: 08/18/19 Potential to Achieve Goals: [place] Discharge plan remains appropriate;Frequency remains appropriate    Co-evaluation    PT/OT/SLP  Co-Evaluation/Treatment: Yes Reason for Co-Treatment: Complexity of the patient's impairments (multi-system involvement);For patient/therapist safety;To address functional/ADL transfers;Necessary to address cognition/behavior during functional activity   OT goals addressed during session: ADL's and self-care      AM-PAC OT "6 Clicks" Daily Activity     Outcome Measure   Help from another person eating meals?: Total Help from another person taking care of personal grooming?: A Lot Help from another person toileting, which includes using toliet, bedpan, or urinal?: Total Help from another person bathing (including washing, rinsing, drying)?: Total Help from another person to put on and taking off regular upper body clothing?: Total Help from another person to put on and taking off regular lower body clothing?: Total 6 Click Score: 7    End of Session Equipment Utilized During Treatment: Gait belt;Oxygen;Other (comment) (stedy; 28% FiO2 5LPM)  OT Visit Diagnosis: Unsteadiness on feet (R26.81);Cognitive communication deficit (R41.841);Hemiplegia and hemiparesis;Muscle weakness (generalized) (M62.81);Apraxia (R48.2);Other symptoms and signs involving cognitive function;Pain Symptoms and signs involving cognitive functions: Cerebral infarction Hemiplegia -  Right/Left: Right Hemiplegia - dominant/non-dominant: Dominant Hemiplegia - caused by: Cerebral infarction Pain - Right/Left: Right   Activity Tolerance Patient tolerated treatment well   Patient Left in bed;with call bell/phone within reach;with bed alarm set   Nurse Communication Mobility status;Other (comment) (back to bed)        Time: 2671-2458 OT Time Calculation (min): 27 min  Charges: OT General Charges $OT Visit: 1 Visit OT Treatments $Therapeutic Activity: 8-22 mins  Audery Amel., COTA/L Acute Rehabilitation Services [phone]    Angelina Pih 08/19/2019, 1:42 PM

## 2019-08-19 NOTE — Progress Notes (Signed)
Physical Therapy Treatment Patient Details Name: Lee Hooper MRN: [id number] DOB: 12-29-1970 Today's Date: 08/19/2019    History of Present Illness This 49 y.o. male admitted 4/28 with Rt sided hemiplegia and aphasia.  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  Repeat CT following procedure showed moderate basal ganglia hemorrhage vs. stroke   Repeat MRI revealed that the ICA and MCA re-occluded with change in FLAIR sequence excluding him from repeat thrombectomy.  He underwent a decompressive hemicraniectomy.  He was intubated 4/28, trach 5/8; liberated from ventilator 5/14.  He developed an ileus 5/2.  5/9 IVC filter placed due to bil LE DVTs (extensive). PEG 5/14. PMH includes: COPD, tobacco abuse, psoriasis    PT Comments    Patient continues to make progress toward PT goals and tolerated increased mobility well. Pt requires +2 assist for OOB mobility using Stedy standing frame and able to stand X 2 trials. Continue to recommend CIR fo further skilled PT services to maximize independence and safety with mobility.     Follow Up Recommendations  CIR     Equipment Recommendations  Wheelchair (measurements PT);Wheelchair cushion (measurements PT);Hospital bed    Recommendations for Other Services       Precautions / Restrictions Precautions Precautions: Fall Precaution Comments: R hemi, trach (has PMSV), PEG, crani-No Bone flap L head; rt shoulder pain/subluxation; bil LE DVTs with IVC filter Restrictions Weight Bearing Restrictions: No    Mobility  Bed Mobility Overal bed mobility: Needs Assistance Bed Mobility: Rolling;Supine to Sit Rolling: Min assist (toward R side) Sidelying to sit: +2 for physical assistance;HOB elevated;Mod assist Supine to sit: +2 for physical assistance;Max assist;HOB elevated   Sit to sidelying: Total assist;+2 for physical assistance General bed mobility comments: pt able to bring L LE to EOB; assist to bring R LE/hip with bed pad  and to elevate trunk into sitting; cues for sequencing   Transfers Overall transfer level: Needs assistance Equipment used: Ambulation equipment used Transfers: Sit to/from Stand Sit to Stand: Max assist;+2 physical assistance Stand pivot transfers: Total assist (stedy)       General transfer comment: +2 assist to power up into standing from EOB and Stedy standing frame; assist to facilitate hip extension, weight shift, and support R UE   Ambulation/Gait                 Stairs             Wheelchair Mobility    Modified Rankin (Stroke Patients Only) Modified Rankin (Stroke Patients Only) Pre-Morbid Rankin Score: No symptoms Modified Rankin: Severe disability     Balance Overall balance assessment: Needs assistance Sitting-balance support: Feet supported;Single extremity supported Sitting balance-Leahy Scale: Poor Sitting balance - Comments: pt able to maintain sitting balance EOB holding onto rail with L UE with min guard assist for safety Postural control: Right lateral lean Standing balance support: Bilateral upper extremity supported Standing balance-Leahy Scale: Poor Standing balance comment: reliant on external assist                            Cognition Arousal/Alertness: Awake/alert Behavior During Therapy: Flat affect (smiling when shown pictures of grandchildren) Overall Cognitive Status: Impaired/Different from baseline Area of Impairment: Attention;Following commands;Problem solving;Awareness;Safety/judgement                   Current Attention Level: Sustained   Following Commands: Follows one step commands with increased time Safety/Judgement: Decreased  awareness of deficits;Decreased awareness of safety Awareness: Intellectual Problem Solving: Slow processing;Requires verbal cues;Requires tactile cues;Decreased initiation General Comments: pt continues to be flat and more fatigued from sitting up in chair, however did  gesture to stomach to indicate pain. Gave thumbs up at end of session      Exercises General Exercises - Lower Extremity Ankle Circles/Pumps: AROM;PROM;Both;Other (comment) (PROM R side) Heel Slides: PROM;Right;10 reps;Supine    General Comments General comments (skin integrity, edema, etc.): pt on trach collar 28% FiO2 5LPM; HR increase to 140s during mobility with all other VSS      Pertinent Vitals/Pain Pain Assessment: Faces Faces Pain Scale: Hurts a little bit Pain Location: stomach (pt gesturing to stomach; however no irritation noted) Pain Descriptors / Indicators: Grimacing;Discomfort Pain Intervention(s): Monitored during session    Home Living                      Prior Function            PT Goals (current goals can now be found in the care plan section) Acute Rehab PT Goals Patient Stated Goal: nods head "yes" to sit at EOB Progress towards PT goals: Progressing toward goals    Frequency    Min 3X/week      PT Plan Current plan remains appropriate    Co-evaluation PT/OT/SLP Co-Evaluation/Treatment: Yes Reason for Co-Treatment: Complexity of the patient's impairments (multi-system involvement);Necessary to address cognition/behavior during functional activity;For patient/therapist safety;To address functional/ADL transfers PT goals addressed during session: Mobility/safety with mobility;Balance OT goals addressed during session: ADL's and self-care      AM-PAC PT "6 Clicks" Mobility   Outcome Measure  Help needed turning from your back to your side while in a flat bed without using bedrails?: A Little Help needed moving from lying on your back to sitting on the side of a flat bed without using bedrails?: A Lot Help needed moving to and from a bed to a chair (including a wheelchair)?: Total Help needed standing up from a chair using your arms (e.g., wheelchair or bedside chair)?: A Lot Help needed to walk in hospital room?: Total Help needed  climbing 3-5 steps with a railing? : Total 6 Click Score: 10    End of Session Equipment Utilized During Treatment: Oxygen Activity Tolerance: Patient tolerated treatment well Patient left: with call bell/phone within reach;in chair;with chair alarm set Nurse Communication: Mobility status;Need for lift equipment PT Visit Diagnosis: Other abnormalities of gait and mobility (R26.89);Hemiplegia and hemiparesis Hemiplegia - Right/Left: Right Hemiplegia - dominant/non-dominant: Dominant Hemiplegia - caused by: Nontraumatic intracerebral hemorrhage     Time: 1610-9604 PT Time Calculation (min) (ACUTE ONLY): 40 min  Charges:  $Gait Training: 8-22 mins $Neuromuscular Re-education: 8-22 mins                     Earney Navy, PTA Acute Rehabilitation Services Pager: [phone] Office: [phone]     Darliss Cheney 08/19/2019, 2:24 PM

## 2019-08-20 ENCOUNTER — Inpatient Hospital Stay (HOSPITAL_COMMUNITY): Payer: Medicaid Other

## 2019-08-20 DIAGNOSIS — Z931 Gastrostomy status: Secondary | ICD-10-CM

## 2019-08-20 LAB — GLUCOSE, CAPILLARY
Glucose-Capillary: 104 mg/dL — ABNORMAL HIGH (ref 70–99)
Glucose-Capillary: 124 mg/dL — ABNORMAL HIGH (ref 70–99)
Glucose-Capillary: 133 mg/dL — ABNORMAL HIGH (ref 70–99)
Glucose-Capillary: 200 mg/dL — ABNORMAL HIGH (ref 70–99)
Glucose-Capillary: 93 mg/dL (ref 70–99)
Glucose-Capillary: 94 mg/dL (ref 70–99)
Glucose-Capillary: 97 mg/dL (ref 70–99)

## 2019-08-20 MED ORDER — RESOURCE THICKENUP CLEAR PO POWD
ORAL | Status: DC | PRN
  Filled 2019-08-20: qty 125

## 2019-08-20 MED ORDER — OSMOLITE 1.5 CAL PO LIQD
237.0000 mL | Freq: Three times a day (TID) | ORAL | Status: DC
  Administered 2019-08-20 – 2019-08-24 (×11): 237 mL
  Filled 2019-08-20 (×13): qty 237

## 2019-08-20 NOTE — Progress Notes (Signed)
Modified Barium Swallow Progress Note  Patient Details  Name: Lee Hooper MRN: [id number] Date of Birth: March 24, 1970  Today's Date: 08/20/2019  Modified Barium Swallow completed.  Full report located under Chart Review in the Imaging Section.  Brief recommendations include the following:  Clinical Impression  Pt demonstrates a mild oral dyspahgia with left buccal weakness and delayed oral transit due to a swishing rolling behavior prior to bolus transit. Pt has mild residue on the floor of the mouth that he senses and gathers for a second swallow. Pt does hove slight premature spillage/delayed laryngeal closure with thin liquids resulting in trace frank penetration/aspiration. Nectar and puree are toelrated without incident, but pt had a hard cough after 90% of swallows despite no penetration, aspiration or residue. Esophageal sweep revealed trace distal residue. Cough may be attributed to PMSV which was in place and may have been causing some discomfort. Pts breathing pattern relaxed and cough response stopped when PMSV was removed. Will need further f/u with PMSV to determine if it is irritating pt. He has been wearing it all waking hours with good vitals. Recommend pt initiate a puree diet and nectar thick liquids. Will f/u for tolerance.    Swallow Evaluation Recommendations       SLP Diet Recommendations: Dysphagia 1 (Puree) solids;Nectar thick liquid   Liquid Administration via: Straw   Medication Administration: Via alternative means   Supervision: Staff to assist with self feeding   Compensations: Slow rate;Small sips/bites   Postural Changes: Remain semi-upright after after feeds/meals (Comment);Seated upright at 90 degrees   Oral Care Recommendations: Oral care BID   Other Recommendations: Have oral suction available;Order thickener from pharmacy    Katieann Hungate, Riley Nearing 08/20/2019,12:24 PM

## 2019-08-20 NOTE — Progress Notes (Signed)
Pt's wife updated at this time. Password provided.

## 2019-08-20 NOTE — Progress Notes (Signed)
INTERVAL HISTORY  Wife at bedside. Pt has passed swallow and now on dys1 and nectar thick. He ate his dinner 50% and he was able to feed himself using left hand. I have decreased his bolus feeding to facilitate po intake.     Vitals:   08/20/19 0421 08/20/19 0808 08/20/19 0825 08/20/19 1208  BP:  105/73  114/79  Pulse:  93 96 (!) 105  Resp:  18 18 18   Temp:  98.5 F (36.9 C)  98.6 F (37 C)  TempSrc:  Oral  Oral  SpO2:   95%   Weight: 92 kg     Height:       CBC:  Recent Labs  Lab 08/17/19 0454 08/19/19 0652  WBC 6.5 6.3  HGB 13.4 14.3  HCT 42.5 45.0  MCV 96.2 94.9  PLT 201 193   Basic Metabolic Panel:  Recent Labs  Lab 08/17/19 0454 08/19/19 0652  NA 137 137  K 4.3 4.2  CL 99 102  CO2 27 26  GLUCOSE 105* 101*  BUN 20 20  CREATININE 0.67 0.60*  CALCIUM 9.3 9.6    IMAGING past 24 hours DG Swallowing Func-Speech Pathology  Result Date: 08/20/2019 Objective Swallowing Evaluation: Type of Study: MBS-Modified Barium Swallow Study  Patient Details Name: Lee Hooper MRN: [id number] Date of Birth: 06-01-1970 Today's Date: 08/20/2019 Time: SLP Start Time (ACUTE ONLY): 1030 -SLP Stop Time (ACUTE ONLY): 1042 SLP Time Calculation (min) (ACUTE ONLY): 12 min Past Medical History: No past medical history on file. Past Surgical History: Past Surgical History: Procedure Laterality Date . CRANIOTOMY Left 07/01/2019  Procedure: Left Hemicraniectomy;  Surgeon: Ashok Pall, MD;  Location: [place];  Service: Neurosurgery;  Laterality: Left; . ESOPHAGOGASTRODUODENOSCOPY N/A 07/17/2019  Procedure: ESOPHAGOGASTRODUODENOSCOPY (EGD);  Surgeon: Jesusita Oka, MD;  Location: [facility] ENDOSCOPY;  Service: General;  Laterality: N/A; . IR CT HEAD LTD  07/01/2019 . IR IVC FILTER PLMT / S&I /IMG GUID/MOD SED  07/12/2019 . IR PERCUTANEOUS ART THROMBECTOMY/INFUSION INTRACRANIAL INC DIAG ANGIO  07/01/2019 . LESION EXCISION N/A 08/05/2019  Procedure: Scalp Wound Revision;  Surgeon: Ashok Pall, MD;  Location: [place];  Service: Neurosurgery;  Laterality: N/A;  left,posterior . PEG PLACEMENT N/A 07/17/2019  Procedure: PERCUTANEOUS ENDOSCOPIC GASTROSTOMY (PEG) PLACEMENT;  Surgeon: Jesusita Oka, MD;  Location: [place];  Service: General;  Laterality: N/A; . RADIOLOGY WITH ANESTHESIA N/A 07/01/2019  Procedure: IR WITH ANESTHESIA;  Surgeon: Radiologist, Medication, MD;  Location: [place];  Service: Radiology;  Laterality: N/A; HPI: This 49 y.o. male admitted 4/28 with Rt sided hemiplegia and aphasia.  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  Repeat CT following procedure showed moderate basal ganglia hemorrhage vs. stroke   Repeat MRI revealed that the ICA and MCA re-occluded with change in FLAIR sequence excluding him from repeat thrombectomy.  He underwent a decompressive hemicraniectomy.  He waws intubated 4/28.  He developed an ileus 5/2. Trach 5/8. IVC filter placed 5/9.  Peg placement.  PMH includes: COPD, tobacco abuse, psoriasis  Subjective: Pt was alert Assessment / Plan / Recommendation CHL IP CLINICAL IMPRESSIONS 08/20/2019 Clinical Impression Pt demonstrates a mild oral dysphagia with left buccal weakness and delayed oral transit due to a swishing rolling behavior prior to bolus transit. Pt has mild residue on the floor of the mouth that he senses and gathers for a second swallow. Pt does hove slight premature spillage/delayed laryngeal closure with thin liquids resulting in trace frank penetration/aspiration. Nectar and puree  are toelrated without incident, but pt had a hard cough after 90% of swallows despite no penetration, aspiration or residue. Esophageal sweep revealed trace distal residue. Cough may be attributed to PMSV which was in place and may have been causing some discomfort. Pts breathing pattern relaxed and cough response stopped when PMSV was removed. Will need further f/u with PMSV to determine if it is irritating pt. He has been wearing it all waking hours with good vitals.  Recommend pt initiate a puree diet and nectar thick liquids. Will f/u for tolerance. SLP Visit Diagnosis Dysphagia, oropharyngeal phase (R13.12) Attention and concentration deficit following -- Frontal lobe and executive function deficit following -- Impact on safety and function Mild aspiration risk   CHL IP TREATMENT RECOMMENDATION 08/20/2019 Treatment Recommendations Therapy as outlined in treatment plan below   Prognosis 08/20/2019 Prognosis for Safe Diet Advancement Good Barriers to Reach Goals Cognitive deficits Barriers/Prognosis Comment -- CHL IP DIET RECOMMENDATION 08/20/2019 SLP Diet Recommendations Dysphagia 1 (Puree) solids;Nectar thick liquid Liquid Administration via Straw Medication Administration Via alternative means Compensations Slow rate;Small sips/bites Postural Changes Remain semi-upright after after feeds/meals (Comment);Seated upright at 90 degrees   CHL IP OTHER RECOMMENDATIONS 08/20/2019 Recommended Consults -- Oral Care Recommendations Oral care BID Other Recommendations Have oral suction available;Order thickener from pharmacy   CHL IP FOLLOW UP RECOMMENDATIONS 08/20/2019 Follow up Recommendations Skilled Nursing facility   [facility] IP FREQUENCY AND DURATION 08/20/2019 Speech Therapy Frequency (ACUTE ONLY) min 2x/week Treatment Duration 2 weeks      CHL IP ORAL PHASE 08/20/2019 Oral Phase Impaired Oral - Pudding Teaspoon -- Oral - Pudding Cup -- Oral - Honey Teaspoon -- Oral - Honey Cup -- Oral - Nectar Teaspoon -- Oral - Nectar Cup -- Oral - Nectar Straw Lingual pumping;Right pocketing in lateral sulci;Delayed oral transit;Left anterior bolus loss Oral - Thin Teaspoon -- Oral - Thin Cup -- Oral - Thin Straw Decreased bolus cohesion;Premature spillage;Right pocketing in lateral sulci Oral - Puree Delayed oral transit;Decreased bolus cohesion;Right pocketing in lateral sulci Oral - Mech Soft -- Oral - Regular -- Oral - Multi-Consistency -- Oral - Pill -- Oral Phase - Comment --  CHL IP PHARYNGEAL  PHASE 08/20/2019 Pharyngeal Phase Impaired Pharyngeal- Pudding Teaspoon -- Pharyngeal -- Pharyngeal- Pudding Cup -- Pharyngeal -- Pharyngeal- Honey Teaspoon -- Pharyngeal -- Pharyngeal- Honey Cup -- Pharyngeal -- Pharyngeal- Nectar Teaspoon -- Pharyngeal -- Pharyngeal- Nectar Cup -- Pharyngeal -- Pharyngeal- Nectar Straw WFL Pharyngeal -- Pharyngeal- Thin Teaspoon -- Pharyngeal -- Pharyngeal- Thin Cup -- Pharyngeal -- Pharyngeal- Thin Straw Penetration/Aspiration before swallow Pharyngeal Material enters airway, CONTACTS cords and not ejected out;Material enters airway, CONTACTS cords and then ejected out;Material does not enter airway Pharyngeal- Puree WFL Pharyngeal -- Pharyngeal- Mechanical Soft -- Pharyngeal -- Pharyngeal- Regular -- Pharyngeal -- Pharyngeal- Multi-consistency -- Pharyngeal -- Pharyngeal- Pill -- Pharyngeal -- Pharyngeal Comment --  CHL IP CERVICAL ESOPHAGEAL PHASE 08/20/2019 Cervical Esophageal Phase WFL Pudding Teaspoon -- Pudding Cup -- Honey Teaspoon -- Honey Cup -- Nectar Teaspoon -- Nectar Cup -- Nectar Straw -- Thin Teaspoon -- Thin Cup -- Thin Straw -- Puree -- Mechanical Soft -- Regular -- Multi-consistency -- Pill -- Cervical Esophageal Comment -- Claudine Mouton 08/20/2019, 12:25 PM               PHYSICAL EXAM  General- middle-aged Caucasian male, s/p tracheostomy on trach collar, sleeping but arousable.   Ophthalmologic- fundi not visualized due to noncooperation.  Cardiovascular - Regular rate and rhythm.  Neuro -  s/p tracheostomy on trach collar. Initially sleepy but arousable, global aphasia, left gaze preference position, barely cross midline, notblinking to visual threat on the left, PERRL. Right facial droop. Tongue protrusion not cooperative. LUE spontaneous movement at least 4/5, LLE against gravity at least 3/5, right UE and LE flaccid still and does not seem to be developing rigidity or spasticity at this time, slight 1/5 movement with pain at RLE no  withdrawal to pain in upper. Sensation, coordination and gait not tested. Not cooperative this morning.    ASSESSMENT/PLAN [name] with history of COPD, ongoing tobacco use, and psoriasis, presenting with dense LMCA syndrome. Attempt for Left ICA and Left M1 thrombectomy was done emergently with TICI 2b flow. Unfortunately, the pt had post procedure hemorrhage and reocclusion of the vessels. F/u imaging suggested early edema and pt underwent prophylactic decompressive hemicraniectomy.  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 Stroke etiology unknown - given DVT requiring eventual AC and high mRS, will not do further embolic workup  Eliquis for VTE prophylaxis  No antithrombotic prior to admission, now on Eliquis.  Therapy recommendations:  SNF   Disposition:  Pending - LTACH consulted however no insurance. CIR consulted however has no one to provide care afterwards - await SNF bed  Dr. Franky Macho  for cranioplasty, earliest next week.   Respiratory failure s/p Trach Trach wound  Downsized to cuffless trach 6 by CCM 5/24  On trach collar intermittently  Hyperglycemia  on TF  Added SSI  Bilateral Lower Ext DVT, extensive Possible PE given tachycardia  Venous US 07/11/2019 DVT + bilateral extensive  IVC filter placed 5/9  On eliquis now for DVT treatment and prophylaxis  Dysphagia   PEG placement 5/14  FW 150 Q4h   On bolus TF -> cut in half to facilitate po intake   MBSS cleared for D1 nectar thick liquids  SLP following for tolerance  Tobacco abuse  Current smoker  Smoking cessation counseling will be provided if able  Other Stroke Risk Factors  Obesity, Body mass index is  27.51 kg/m., recommend weight loss, diet and exercise as appropriate    Likely undiagnosed obstructive sleep apnea based on body habitus  Other Active Problems     Hospital day # 89   I had long discussion with wife at bedside, updated pt current condition, treatment plan and potential prognosis, and answered all the questions. She expressed understanding and appreciation.    Marvel Plan, MD PhD Stroke Neurology 08/20/2019 2:27 PM   To contact Stroke Continuity provider, please refer to [email or web address]. After hours, contact General Neurology

## 2019-08-20 NOTE — Plan of Care (Signed)

## 2019-08-20 NOTE — Progress Notes (Signed)
Patient ID: Lee Hooper, male   DOB: 1970/10/20, 49 y.o.   MRN: [id number]  BP 111/81 (BP Location: Left Arm)   Pulse (!) 108   Temp 98.5 F (36.9 C) (Oral)   Resp 18   Ht 6' (1.829 m)   Wt 92 kg   SpO2 94%   BMI 27.51 kg/m   Cranioplasty plan has been delivered. I will speak with his wife about the procedure.

## 2019-08-21 LAB — GLUCOSE, CAPILLARY
Glucose-Capillary: 100 mg/dL — ABNORMAL HIGH (ref 70–99)
Glucose-Capillary: 93 mg/dL (ref 70–99)
Glucose-Capillary: 135 mg/dL — ABNORMAL HIGH (ref 70–99)
Glucose-Capillary: 96 mg/dL (ref 70–99)
Glucose-Capillary: 104 mg/dL — ABNORMAL HIGH (ref 70–99)

## 2019-08-21 NOTE — Progress Notes (Signed)
STROKE PROGRESS NOTE INTERVAL HISTORY  No family at bedside. Pt no acute event overnight. Pt continue to eat po. Bolus feeding has cut in half. PT now recommend CIR.   Vitals:   08/21/19 0751 08/21/19 1220 08/21/19 1222 08/21/19 1612  BP: 102/74  111/77 119/75  Pulse: 82 92 96 98  Resp: 18 18 18 18   Temp: 98.4 F (36.9 C)  98.3 F (36.8 C) 98.1 F (36.7 C)  TempSrc: Oral  Oral Axillary  SpO2: 94% 96% 95% 95%  Weight:      Height:       CBC:  Recent Labs  Lab 08/17/19 0454 08/19/19 0652  WBC 6.5 6.3  HGB 13.4 14.3  HCT 42.5 45.0  MCV 96.2 94.9  PLT 201 191   Basic Metabolic Panel:  Recent Labs  Lab 08/17/19 0454 08/19/19 0652  NA 137 137  K 4.3 4.2  CL 99 102  CO2 27 26  GLUCOSE 105* 101*  BUN 20 20  CREATININE 0.67 0.60*  CALCIUM 9.3 9.6    IMAGING past 24 hours No results found.  PHYSICAL EXAM  General- middle-aged Caucasian male, s/p tracheostomy on trach collar, sleeping but arousable.   Ophthalmologic- fundi not visualized due to noncooperation.  Cardiovascular - Regular rate and rhythm.  Neuro - s/p tracheostomy on trach collar. Initially sleepy but arousable, global aphasia, left gaze preference position, barely cross midline, notblinking to visual threat on the left, PERRL. Right facial droop. Tongue protrusion not cooperative. LUE spontaneous movement at least 4/5, LLE against gravity at least 3/5, right UE and LE flaccid still and does not seem to be developing rigidity or spasticity at this time, slight 1/5 movement with pain at RLE no withdrawal to pain in upper. Sensation, coordination and gait not tested. Not cooperative this morning.    ASSESSMENT/PLAN [name] with history of COPD, ongoing tobacco use, and psoriasis, presenting with dense LMCA syndrome. Attempt for Left ICA and Left M1 thrombectomy was done emergently with TICI 2b flow. Unfortunately, the pt had post procedure hemorrhage and reocclusion of  the vessels. F/u imaging suggested early edema and pt underwent prophylactic decompressive hemicraniectomy.  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 Stroke etiology unknown - given DVT requiring eventual AC and high mRS, will not do further embolic workup  Eliquis for VTE prophylaxis  No antithrombotic prior to admission, now on Eliquis.  Therapy recommendations:  CIR   Disposition:  pending  Dr. 44  for cranioplasty, earliest next week.   Respiratory failure s/p Trach Trach wound  Downsized to cuffless trach 6 by CCM 5/24  Hyperglycemia  on TF  Added SSI  Bilateral Lower Ext DVT, extensive Possible PE given tachycardia  Venous 6/24 07/11/2019 DVT + bilateral extensive  IVC filter placed 5/9  On eliquis now for DVT treatment and prophylaxis  Dysphagia   PEG placement 5/14  FW 150 Q4h   On bolus TF -> cut in half to facilitate po intake   MBSS cleared for D1 nectar thick liquids  SLP following for tolerance  Tobacco abuse  Current smoker  Smoking cessation counseling will be provided if able  Other Stroke Risk Factors  Obesity, Body mass index is  27.72 kg/m., recommend weight loss, diet and exercise as appropriate    Likely undiagnosed obstructive sleep apnea based on body habitus  Other Active Problems     Hospital day # 12   Rosalin Hawking, MD PhD Stroke Neurology 08/22/2019 1:14 AM  To contact Stroke Continuity provider, please refer to [email or web address] After hours, contact General Neurology

## 2019-08-21 NOTE — Progress Notes (Signed)
Physical Therapy Treatment Patient Details Name: Lee Hooper MRN: [id number] DOB: Jul 18, 1970 Today's Date: 08/21/2019    History of Present Illness This 49 y.o. male admitted 4/28 with Rt sided hemiplegia and aphasia.  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  Repeat CT following procedure showed moderate basal ganglia hemorrhage vs. stroke   Repeat MRI revealed that the ICA and MCA re-occluded with change in FLAIR sequence excluding him from repeat thrombectomy.  He underwent a decompressive hemicraniectomy.  He was intubated 4/28, trach 5/8; liberated from ventilator 5/14.  He developed an ileus 5/2.  5/9 IVC filter placed due to bil LE DVTs (extensive). PEG 5/14. PMH includes: COPD, tobacco abuse, psoriasis    PT Comments    Patient seen for mobility progression. Pt is following single step commands with increased time and able to partially stand X 2 trials using Stedy standing frame. Pt requires max A +2 for functional transfer training and min guard assist for sitting balance EOB with L UE support. Continue to progress as tolerated.     Follow Up Recommendations  CIR     Equipment Recommendations  Wheelchair (measurements PT);Wheelchair cushion (measurements PT);Hospital bed    Recommendations for Other Services       Precautions / Restrictions Precautions Precautions: Fall Precaution Comments: R hemi, trach (has PMSV), PEG, crani-No Bone flap L head; rt shoulder pain/subluxation; bil LE DVTs with IVC filter Restrictions Weight Bearing Restrictions: No    Mobility  Bed Mobility Overal bed mobility: Needs Assistance Bed Mobility: Sit to Sidelying;Supine to Sit Rolling:  (toward R side)   Supine to sit: +2 for physical assistance;Max assist;HOB elevated   Sit to sidelying: Total assist;+2 for physical assistance General bed mobility comments: cues for sequencing; assist to bring R LE/hip to EOB with bed pad and then to elevate trunk into sitting; assist  to guide trunk and bring bilat LE into bed; verbal and visual cues to come down on L elbow and pt initiated   Transfers Overall transfer level: Needs assistance Equipment used: Ambulation equipment used Transfers: Sit to/from Stand Sit to Stand: Max assist;+2 physical assistance         General transfer comment: pt able to partially stand X 2 trials; R UE supported by therapist; bed pad used to facilitate hip extension   Ambulation/Gait                 Stairs             Wheelchair Mobility    Modified Rankin (Stroke Patients Only) Modified Rankin (Stroke Patients Only) Pre-Morbid Rankin Score: No symptoms Modified Rankin: Severe disability     Balance Overall balance assessment: Needs assistance Sitting-balance support: Feet supported;Single extremity supported Sitting balance-Leahy Scale: Poor     Standing balance support: Bilateral upper extremity supported Standing balance-Leahy Scale: Poor                              Cognition Arousal/Alertness: Awake/alert Behavior During Therapy: Flat affect Overall Cognitive Status: Impaired/Different from baseline Area of Impairment: Attention;Following commands;Problem solving;Awareness;Safety/judgement                   Current Attention Level: Sustained   Following Commands: Follows one step commands with increased time Safety/Judgement: Decreased awareness of deficits;Decreased awareness of safety Awareness: Intellectual Problem Solving: Slow processing;Requires verbal cues;Requires tactile cues;Decreased initiation        Exercises  General Comments        Pertinent Vitals/Pain Pain Assessment: Faces Faces Pain Scale: Hurts a little bit Pain Location: chest (pt rubbing chest and nodded head yes to chest feeling sore and no to pain) Pain Descriptors / Indicators: Discomfort Pain Intervention(s): Monitored during session;Limited activity within patient's tolerance     Home Living                      Prior Function            PT Goals (current goals can now be found in the care plan section) Progress towards PT goals: Progressing toward goals    Frequency    Min 3X/week      PT Plan Current plan remains appropriate    Co-evaluation              AM-PAC PT "6 Clicks" Mobility   Outcome Measure  Help needed turning from your back to your side while in a flat bed without using bedrails?: A Little Help needed moving from lying on your back to sitting on the side of a flat bed without using bedrails?: A Lot Help needed moving to and from a bed to a chair (including a wheelchair)?: Total Help needed standing up from a chair using your arms (e.g., wheelchair or bedside chair)?: A Lot Help needed to walk in hospital room?: Total Help needed climbing 3-5 steps with a railing? : Total 6 Click Score: 10    End of Session Equipment Utilized During Treatment: Oxygen Activity Tolerance: Patient tolerated treatment well Patient left: with call bell/phone within reach;in chair;with chair alarm set Nurse Communication: Mobility status;Need for lift equipment PT Visit Diagnosis: Other abnormalities of gait and mobility (R26.89);Hemiplegia and hemiparesis Hemiplegia - Right/Left: Right Hemiplegia - dominant/non-dominant: Dominant Hemiplegia - caused by: Nontraumatic intracerebral hemorrhage     Time: 1448-1856 PT Time Calculation (min) (ACUTE ONLY): 25 min  Charges:  $Therapeutic Activity: 23-37 mins                     Erline Levine, PTA Acute Rehabilitation Services Pager: [phone] Office: [phone]     Carolynne Edouard 08/21/2019, 11:36 AM

## 2019-08-21 NOTE — Plan of Care (Signed)

## 2019-08-22 ENCOUNTER — Encounter (HOSPITAL_COMMUNITY): Payer: Self-pay | Admitting: Neurosurgery

## 2019-08-22 ENCOUNTER — Inpatient Hospital Stay (HOSPITAL_COMMUNITY): Payer: Medicaid Other

## 2019-08-22 DIAGNOSIS — Z930 Tracheostomy status: Secondary | ICD-10-CM

## 2019-08-22 DIAGNOSIS — L89151 Pressure ulcer of sacral region, stage 1: Secondary | ICD-10-CM

## 2019-08-22 DIAGNOSIS — I82403 Acute embolism and thrombosis of unspecified deep veins of lower extremity, bilateral: Secondary | ICD-10-CM

## 2019-08-22 DIAGNOSIS — R739 Hyperglycemia, unspecified: Secondary | ICD-10-CM

## 2019-08-22 DIAGNOSIS — L409 Psoriasis, unspecified: Secondary | ICD-10-CM

## 2019-08-22 DIAGNOSIS — A419 Sepsis, unspecified organism: Secondary | ICD-10-CM | POA: Diagnosis not present

## 2019-08-22 DIAGNOSIS — I69391 Dysphagia following cerebral infarction: Secondary | ICD-10-CM

## 2019-08-22 DIAGNOSIS — I63512 Cerebral infarction due to unspecified occlusion or stenosis of left middle cerebral artery: Principal | ICD-10-CM

## 2019-08-22 DIAGNOSIS — Z931 Gastrostomy status: Secondary | ICD-10-CM

## 2019-08-22 LAB — CBC
HCT: 43.9 % (ref 39.0–52.0)
HCT: 49.2 % (ref 39.0–52.0)
Hemoglobin: 14.2 g/dL (ref 13.0–17.0)
Hemoglobin: 15.7 g/dL (ref 13.0–17.0)
MCH: 30.3 pg (ref 26.0–34.0)
MCH: 30.7 pg (ref 26.0–34.0)
MCHC: 32.3 g/dL (ref 30.0–36.0)
MCHC: 31.9 g/dL (ref 30.0–36.0)
MCV: 93.8 fL (ref 80.0–100.0)
MCV: 96.1 fL (ref 80.0–100.0)
Platelets: 225 10*3/uL (ref 150–400)
Platelets: 242 10*3/uL (ref 150–400)
RBC: 4.68 MIL/uL (ref 4.22–5.81)
RBC: 5.12 MIL/uL (ref 4.22–5.81)
RDW: 16.4 % — ABNORMAL HIGH (ref 11.5–15.5)
RDW: 16.4 % — ABNORMAL HIGH (ref 11.5–15.5)
WBC: 13.4 10*3/uL — ABNORMAL HIGH (ref 4.0–10.5)
WBC: 16 10*3/uL — ABNORMAL HIGH (ref 4.0–10.5)
nRBC: 0 % (ref 0.0–0.2)
nRBC: 0 % (ref 0.0–0.2)

## 2019-08-22 LAB — COMPREHENSIVE METABOLIC PANEL
ALT: 46 U/L — ABNORMAL HIGH (ref 0–44)
AST: 28 U/L (ref 15–41)
Albumin: 2.8 g/dL — ABNORMAL LOW (ref 3.5–5.0)
Alkaline Phosphatase: 64 U/L (ref 38–126)
Anion gap: 10 (ref 5–15)
BUN: 23 mg/dL — ABNORMAL HIGH (ref 6–20)
CO2: 24 mmol/L (ref 22–32)
Calcium: 9.3 mg/dL (ref 8.9–10.3)
Chloride: 100 mmol/L (ref 98–111)
Creatinine, Ser: 0.84 mg/dL (ref 0.61–1.24)
GFR calc Af Amer: >60 mL/min (ref >60)
GFR calc non Af Amer: >60 mL/min (ref >60)
Glucose, Bld: 145 mg/dL — ABNORMAL HIGH (ref 70–99)
Potassium: 3.8 mmol/L (ref 3.5–5.1)
Sodium: 134 mmol/L — ABNORMAL LOW (ref 135–145)
Total Bilirubin: 0.5 mg/dL (ref 0.3–1.2)
Total Protein: 6.9 g/dL (ref 6.5–8.1)

## 2019-08-22 LAB — URINALYSIS, COMPLETE (UACMP) WITH MICROSCOPIC
Bilirubin Urine: NEGATIVE
Glucose, UA: NEGATIVE mg/dL
Hgb urine dipstick: NEGATIVE
Ketones, ur: NEGATIVE mg/dL
Leukocytes,Ua: NEGATIVE
Nitrite: NEGATIVE
Protein, ur: NEGATIVE mg/dL
Specific Gravity, Urine: 1.024 (ref 1.005–1.030)
pH: 7 (ref 5.0–8.0)

## 2019-08-22 LAB — BASIC METABOLIC PANEL
Anion gap: 10 (ref 5–15)
BUN: 21 mg/dL — ABNORMAL HIGH (ref 6–20)
CO2: 24 mmol/L (ref 22–32)
Calcium: 9.2 mg/dL (ref 8.9–10.3)
Chloride: 97 mmol/L — ABNORMAL LOW (ref 98–111)
Creatinine, Ser: 0.87 mg/dL (ref 0.61–1.24)
GFR calc Af Amer: >60 mL/min (ref >60)
GFR calc non Af Amer: >60 mL/min (ref >60)
Glucose, Bld: 150 mg/dL — ABNORMAL HIGH (ref 70–99)
Potassium: 4.3 mmol/L (ref 3.5–5.1)
Sodium: 131 mmol/L — ABNORMAL LOW (ref 135–145)

## 2019-08-22 LAB — BLOOD GAS, ARTERIAL
Acid-Base Excess: 1.8 mmol/L (ref 0.0–2.0)
Bicarbonate: 25.2 mmol/L (ref 20.0–28.0)
Drawn by: 535471
FIO2: 21
O2 Saturation: 92.5 %
Patient temperature: 39
pCO2 arterial: 38.6 mmHg (ref 32.0–48.0)
pH, Arterial: 7.44 (ref 7.350–7.450)
pO2, Arterial: 69.3 mmHg — ABNORMAL LOW (ref 83.0–108.0)

## 2019-08-22 LAB — PROTIME-INR
INR: 1.3 — ABNORMAL HIGH (ref 0.8–1.2)
Prothrombin Time: 15.5 seconds — ABNORMAL HIGH (ref 11.4–15.2)

## 2019-08-22 LAB — GLUCOSE, CAPILLARY
Glucose-Capillary: 130 mg/dL — ABNORMAL HIGH (ref 70–99)
Glucose-Capillary: 160 mg/dL — ABNORMAL HIGH (ref 70–99)
Glucose-Capillary: 107 mg/dL — ABNORMAL HIGH (ref 70–99)
Glucose-Capillary: 121 mg/dL — ABNORMAL HIGH (ref 70–99)

## 2019-08-22 LAB — PROCALCITONIN: Procalcitonin: <0.1 ng/mL

## 2019-08-22 LAB — LACTIC ACID, PLASMA
Lactic Acid, Venous: 2.2 mmol/L (ref 0.5–1.9)
Lactic Acid, Venous: 3.3 mmol/L (ref 0.5–1.9)

## 2019-08-22 LAB — APTT: aPTT: 33 seconds (ref 24–36)

## 2019-08-22 MED ORDER — SODIUM CHLORIDE 0.9 % IV SOLN
2.0000 g | Freq: Three times a day (TID) | INTRAVENOUS | Status: DC
  Administered 2019-08-22 – 2019-08-28 (×18): 2 g via INTRAVENOUS
  Filled 2019-08-22 (×18): qty 2

## 2019-08-22 MED ORDER — SODIUM CHLORIDE 0.9 % IV SOLN: INTRAVENOUS | Status: AC

## 2019-08-22 MED ORDER — SODIUM CHLORIDE 0.9 % IV BOLUS
500.0000 mL | Freq: Once | INTRAVENOUS | Status: AC
  Administered 2019-08-22: 500 mL via INTRAVENOUS

## 2019-08-22 MED ORDER — VANCOMYCIN HCL 1250 MG/250ML IV SOLN
1250.0000 mg | Freq: Two times a day (BID) | INTRAVENOUS | Status: DC
  Administered 2019-08-23 – 2019-08-24 (×3): 1250 mg via INTRAVENOUS
  Filled 2019-08-22 (×4): qty 250

## 2019-08-22 MED ORDER — LACTATED RINGERS IV SOLN: INTRAVENOUS | Status: DC

## 2019-08-22 MED ORDER — SODIUM CHLORIDE 0.9 % IV SOLN: 2.0000 g | Freq: Once | INTRAVENOUS | Status: DC

## 2019-08-22 MED ORDER — METRONIDAZOLE IN NACL 5-0.79 MG/ML-% IV SOLN
500.0000 mg | Freq: Three times a day (TID) | INTRAVENOUS | Status: DC
  Administered 2019-08-22 – 2019-08-28 (×18): 500 mg via INTRAVENOUS
  Filled 2019-08-22 (×18): qty 100

## 2019-08-22 MED ORDER — ACETAMINOPHEN 10 MG/ML IV SOLN
1000.0000 mg | Freq: Four times a day (QID) | INTRAVENOUS | Status: AC | PRN
  Administered 2019-08-22 – 2019-08-23 (×4): 1000 mg via INTRAVENOUS
  Filled 2019-08-22 (×4): qty 100

## 2019-08-22 MED ORDER — VANCOMYCIN HCL 1750 MG/350ML IV SOLN
1750.0000 mg | Freq: Once | INTRAVENOUS | Status: AC
  Administered 2019-08-22: 1750 mg via INTRAVENOUS
  Filled 2019-08-22: qty 350

## 2019-08-22 MED ORDER — VANCOMYCIN HCL IN DEXTROSE 1-5 GM/200ML-% IV SOLN: 1000.0000 mg | Freq: Once | INTRAVENOUS | Status: DC

## 2019-08-22 NOTE — Progress Notes (Signed)
Reached a phebotomist on a different tower than west tower since a total of 10-12 phone calls were made to lab/phlebotomy in regard to the second lactic acid lab draw without being able to reach a phlebotomist.  I had to ask a favor for [place], phlebotomy tech not on [address] to [employer] since nobody on [address] was available. [place] was able to draw lactic acid labs right at shift change at 1900.

## 2019-08-22 NOTE — Progress Notes (Signed)
This note also relates to the following rows which could not be included: BP - Cannot attach notes to unvalidated device data Pulse Rate - Cannot attach notes to unvalidated device data ECG Heart Rate - Cannot attach notes to unvalidated device data Resp - Cannot attach notes to unvalidated device data    08/22/19 2100  Assess: MEWS Score  Temp 99.5 F (37.5 C)  Level of Consciousness Alert  SpO2 92 %  O2 Device Tracheostomy Collar  O2 Flow Rate (L/min) 5 L/min  FiO2 (%) 21 %  Assess: MEWS Score  MEWS Temp 0  MEWS Systolic 0  MEWS Pulse 3  MEWS RR 2  MEWS LOC 0  MEWS Score 5  MEWS Score Color Red

## 2019-08-22 NOTE — Progress Notes (Signed)
Pharmacy Antibiotic Note  Lee Hooper is a 49 y.o. male admitted on 07/01/2019 with CVA. Pharmacy has been consulted for vancomycin + cefepime dosing with concerns for developing sepsis. Cr up slightly today, pt febrile.  Plan: -Cefepime 2g IV q8h -Vancomycin 1750mg  IV x1 then 1250mg  IV q12h -Follow Cr, LOT, cultures -Vancomycin trough as needed   Height: 6' (182.9 cm) Weight: 92.2 kg (203 lb 4.2 oz) IBW/kg (Calculated) : 77.6  Temp (24hrs), Avg:100.6 F (38.1 C), Min:98.1 F (36.7 C), Max:103.2 F (39.6 C)  Recent Labs  Lab 08/17/19 0454 08/19/19 0652 08/22/19 1255  WBC 6.5 6.3 13.4*  CREATININE 0.67 0.60* 0.84    Estimated Creatinine Clearance: 115.5 mL/min (by C-G formula based on SCr of 0.84 mg/dL).    Allergies  Allergen Reactions  . Tegaderm Ag Mesh [Silver] Other (See Comments)    blisters    Antimicrobials this admission: Cefepime 4/30 >>5/2; 6/19 >>    Augmentin 5/2>>5/5 Unasyn 5/5 >> 5/6 Cefepime 5/11 >> 5/14, 5/24>5/30 Vancomycin 5/11 >> 5/14, 5/24>5/27; 6/19 >> Cefazolin 5/14 >> 5/17 Cefazolin 2 gm x 1 pre-op on 6/2  Microbiology results: 4/28 MRSA neg 4/30 TA: m cat, h. Influenzae - beta lactamase + 5/2 UCx - negative 5/2 BCx - negative 5/7 Bcx - negative 5/7 TA - H.influenza + Moraxella 5/11 BCx - negative 5/11TA - E.coli (S Ancef, Cipro, Zosyn) 5/25 BCx: negative 6/2 Wound culture: normal flora  Thank you for allowing pharmacy to be a part of this patient's care.   6/25, PharmD, BCPS Clinical Pharmacist [phone] Please check AMION for all [facility] Pharmacy numbers 08/22/2019

## 2019-08-22 NOTE — Progress Notes (Signed)
Notified bedside nurse of need for repeat lactic acid. She was checking with the lab to see if it had been drawn already.

## 2019-08-22 NOTE — Significant Event (Addendum)
Rapid Response Event Note  Overview: Arrival Time: 1545 Event Type: MEWS Unable to take primary RN's call at 1406 as I was with multiple emergencies. Asked primary RN to notify attending MD if additional interventions were needed.   Pt remains febrile and tachycardic.   Initial Focused Assessment: Pt lying in bed, calm. Nods appropriately. Pt is warm, moist to touch. Lung sounds are clear, diminished. Abdomen is soft. Pt denies pain and does not grimace to palpation. Pt does not appear distressed. Heart rate is tachycardic, regular, no adventitious heart sounds. Sheets are bundled behind his back and excess linen is noted on bed. VS: T 102.21F axillary, BP 107/76, HR 124, RR 27, SpO2 97% on TC 5L/21%   Spoke with primary RN this morning at 1225 regarding MEWS for tachycardia and fever. RN had spoke to attending and received new orders. RN did not request assistance of rapid response at this time. Rapid response RN recommended treating fevers with PRN medications, removing excess linen, ice packs, and reevaluation of treatment within the following hour.   Interventions: Rapid response called away to another emergency at 1600, requested RN, L. Rudd, to complete the following:  -Get a rectal temperature- notify MD and request additional pharmacological means of treatment if current medications are ineffective -Tepid bath -Remove excess linen from the bed and straighten linen -Ice packs to axilla and groin  Plan of Care (if not transferred): -Continue current plan of care: IV antibiotics, IV fluids, follow up with attending results of labs ordered, treat fever with available PRNs, consult attending MD if fever does not respond to PRN treatment.  -Call rapid response for additional needs.   Jennye Moccasin

## 2019-08-22 NOTE — Progress Notes (Addendum)
CRITICAL VALUE ALERT  Critical Value:  Lactic acid 2.2  Date & Time Notied:  08/22/19 1754  Provider Notified: Valentina Lucks, NP (Neurology).  Orders Received/Actions taken: Provide made aware.  See previous notes and orders.

## 2019-08-22 NOTE — Progress Notes (Signed)
Occupational Therapy Treatment Patient Details Name: Lee Hooper MRN: [id number] DOB: 12/16/1970 Today's Date: 08/22/2019    History of present illness This 49 y.o. male admitted 4/28 with Rt sided hemiplegia and aphasia.  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  Repeat CT following procedure showed moderate basal ganglia hemorrhage vs. stroke   Repeat MRI revealed that the ICA and MCA re-occluded with change in FLAIR sequence excluding him from repeat thrombectomy.  He underwent a decompressive hemicraniectomy.  He was intubated 4/28, trach 5/8; liberated from ventilator 5/14.  He developed an ileus 5/2.  5/9 IVC filter placed due to bil LE DVTs (extensive). PEG 5/14. PMH includes: COPD, tobacco abuse, psoriasis   OT comments  Pt. Seen for skilled OT treatment session.  Able to complete bed level grooming task with set up, wash face.  Following 1-2 step commands with increased time for item identification and reaching for and handing items.  Used LUE to aide in repositioning in bed.    Follow Up Recommendations  CIR    Equipment Recommendations  Other (comment)    Recommendations for Other Services      Precautions / Restrictions Precautions Precautions: Fall Precaution Comments: R hemi, trach (has PMSV), PEG, crani-No Bone flap L head; rt shoulder pain/subluxation; bil LE DVTs with IVC filter Restrictions Weight Bearing Restrictions: No       Mobility Bed Mobility Overal bed mobility: Needs Assistance             General bed mobility comments: able to follow cues to reach for L bed rail and pull self back to middle of the bed  Transfers                      Balance                                           ADL either performed or assessed with clinical judgement   ADL Overall ADL's : Needs assistance/impaired     Grooming: Wash/dry face;Bed level;Set up Grooming Details (indicate cue type and reason): handed pt.  washcloth, L hand. pt. able to wash face without cues                               General ADL Comments: visual tracking and grooming item identification.  pt. with incorrect identifiecation between ointment and deoderant when asked to reach for deoderant. however, once he was handed the deoderant he was able to follow remaining 1-2 step commands with accuracy and increased time.  ie: hand it to me, reach for it. and this involved multi directions.  pt. also following commands related to precurser for bed mobility.  when instructed to reach for bed rail with LUE and pull back to center pt. able to reach and pulled himself to the center vs. R lean he was initially in.     Vision       Perception     Praxis      Cognition Arousal/Alertness: Awake/alert Behavior During Therapy: Flat affect Overall Cognitive Status: Impaired/Different from baseline Area of Impairment: Attention;Following commands;Problem solving;Awareness;Safety/judgement                       Following Commands: Follows one step commands with increased time Safety/Judgement:  Decreased awareness of deficits;Decreased awareness of safety Awareness: Intellectual Problem Solving: Slow processing;Requires verbal cues;Requires tactile cues;Decreased initiation General Comments: able to follow instructions to assist with repositioning in the bed and also location of grooming items and instructions for handing and reaching for them        Exercises     Shoulder Instructions       General Comments      Pertinent Vitals/ Pain          Home Living                                          Prior Functioning/Environment              Frequency  Min 2X/week        Progress Toward Goals  OT Goals(current goals can now be found in the care plan section)  Progress towards OT goals: Progressing toward goals     Plan Discharge plan remains appropriate;Frequency remains  appropriate    Co-evaluation                 AM-PAC OT "6 Clicks" Daily Activity     Outcome Measure   Help from another person eating meals?: Total Help from another person taking care of personal grooming?: A Lot Help from another person toileting, which includes using toliet, bedpan, or urinal?: Total Help from another person bathing (including washing, rinsing, drying)?: Total Help from another person to put on and taking off regular upper body clothing?: Total Help from another person to put on and taking off regular lower body clothing?: Total 6 Click Score: 7    End of Session    OT Visit Diagnosis: Unsteadiness on feet (R26.81);Cognitive communication deficit (R41.841);Hemiplegia and hemiparesis;Muscle weakness (generalized) (M62.81);Apraxia (R48.2);Other symptoms and signs involving cognitive function;Pain Symptoms and signs involving cognitive functions: Cerebral infarction Hemiplegia - Right/Left: Right Hemiplegia - dominant/non-dominant: Dominant Hemiplegia - caused by: Cerebral infarction Pain - Right/Left: Right   Activity Tolerance Patient tolerated treatment well   Patient Left in bed;with call bell/phone within reach;with bed alarm set   Nurse Communication          Time: 4166-0630 OT Time Calculation (min): 13 min  Charges: OT General Charges $OT Visit: 1 Visit OT Treatments $Self Care/Home Management : 8-22 mins  Boneta Lucks, COTA/L Acute Rehabilitation [phone]   Robet Leu 08/22/2019, 10:35 AM

## 2019-08-22 NOTE — Plan of Care (Signed)
Lactate-2.2. Appreciate medicine consultation. Lactate has been ordered for trending-once followed by every 2 hours x2 to assess response to antibiotics due to sepsis. Recheck the second lactate now and the next 1 in 2 hours. Call neurology with the values of the critical. We will touch base with IM as needed. Plan relayed to Misty Stanley, RN.  -- Milon Dikes, MD [facility] Pager: [phone] If 7pm to 7am, please call on call as listed on AMION.

## 2019-08-22 NOTE — Progress Notes (Addendum)
   08/22/19 1127  Assess: MEWS Score  Temp (!) 103.2 F (39.6 C) (RN Notified)  BP 117/81  Pulse Rate (!) 127 (RN Notified)  ECG Heart Rate (!) 129  Resp 20  Level of Consciousness Alert  SpO2 95 %  O2 Device Tracheostomy Collar  Patient Activity (if Appropriate) In bed  O2 Flow Rate (L/min) 5 L/min  FiO2 (%) 21 %  Assess: MEWS Score  MEWS Temp 2  MEWS Systolic 0  MEWS Pulse 2  MEWS RR 0  MEWS LOC 0  MEWS Score 4  MEWS Score Color Red  Assess: if the MEWS score is Yellow or Red  Were vital signs taken at a resting state? Yes  Focused Assessment Documented focused assessment  Early Detection of Sepsis Score *See Row Information* Medium  MEWS guidelines implemented *See Row Information* No, previously red, continue vital signs every 4 hours  Treat  MEWS Interventions Administered scheduled meds/treatments  Take Vital Signs  Increase Vital Sign Frequency  Red: Q 1hr X 4 then Q 4hr X 4, if remains red, continue Q 4hrs  Escalate  MEWS: Escalate Red: discuss with charge nurse/RN and provider, consider discussing with RRT  Notify: Charge Nurse/RN  Name of Charge Nurse/RN Notified Rakita, RN  Date Charge Nurse/RN Notified 08/22/19  Time Charge Nurse/RN Notified 1127  Notify: Provider  Provider Name/Title D. Rinehauls, PA  Date Provider Notified 08/22/19  Time Provider Notified 1127  Notification Type Page  Notification Reason Change in status  Response See new orders  Date of Provider Response 08/22/19  Time of Provider Response 1127  Notify: Rapid Response  Name of Rapid Response RN Notified Helle  Date Rapid Response Notified 08/22/19  Time Rapid Response Notified 1127  Document  Patient Outcome Stabilized after interventions  Progress note created (see row info) Yes   Provider D. Rinehauls pages, see new orders.  Metoprolol 5 mg IV given, Tylenol gien.  New order for Urinalysis, blood cultures, chest X-ray.  **Note: Rapid Response Nurse was entered in error and  was [place], Charity fundraiser.  The time of notification is correct. Changes in the actual data table would result in the full vital signs table being erased.  To preserve the vital signs table the correction is made as an addendum here.

## 2019-08-22 NOTE — Progress Notes (Addendum)
   08/22/19 1352  Assess: MEWS Score  Temp (!) 101.6 F (38.7 C)  BP 113/75  Pulse Rate (!) 111  ECG Heart Rate (!) 112  Resp (!) 25  Level of Consciousness Alert  SpO2 96 %  O2 Device Tracheostomy Collar  O2 Flow Rate (L/min) 5 L/min  FiO2 (%) 21 %  Assess: MEWS Score  MEWS Temp 2  MEWS Systolic 0  MEWS Pulse 2  MEWS RR 1  MEWS LOC 0  MEWS Score 5  MEWS Score Color Red  Assess: if the MEWS score is Yellow or Red  Were vital signs taken at a resting state? Yes  Focused Assessment Documented focused assessment  Early Detection of Sepsis Score *See Row Information* Medium  MEWS guidelines implemented *See Row Information* No, vital signs rechecked  Treat  MEWS Interventions Administered scheduled meds/treatments  Take Vital Signs  Increase Vital Sign Frequency  Red: Q 1hr X 4 then Q 4hr X 4, if remains red, continue Q 4hrs  Escalate  MEWS: Escalate Red: discuss with charge nurse/RN and provider, consider discussing with RRT  Notify: Charge Nurse/RN  Name of Charge Nurse/RN Notified Rakita, RN  Date Charge Nurse/RN Notified 08/22/19  Time Charge Nurse/RN Notified 1352  Notify: Provider  Provider Name/Title D. Shayne Alken, PA  Date Provider Notified 08/22/19  Time Provider Notified 1352  Notification Type Page  Notification Reason Change in status  Response Other (Comment) (Provider made aware)  Date of Provider Response 08/22/19  Time of Provider Response 1352  Notify: Rapid Response  Name of Rapid Response RN Notified Helle  Date Rapid Response Notified 08/22/19  Time Rapid Response Notified 1352  Document  Patient Outcome Stabilized after interventions  Progress note created (see row info) Yes  Provider Dr. Bernardo Heater, PA paged, made aware, see orders **Note: Rapid Response Nurse was entered in error and was [place], Charity fundraiser.  The time of notification is correct. Changes in the actual data table would result in the full vital signs table being erased.  To preserve the  vital signs table the correction is made as an addendum here.

## 2019-08-22 NOTE — Progress Notes (Signed)
Spoke with bedside RN who made MD aware of F/U L/A which orders of bolus of fluids and increasing maintenance drip, another lactic is due later tonight

## 2019-08-22 NOTE — Consult Note (Signed)
Medical Consultation   Lee Hooper  ZOX:[id number]  DOB: Mar 23, 1970  DOA: 07/01/2019  PCP: Patient, No Pcp Per   Outpatient Specialists: None   Requesting physician: [place]/Rinehuls - neurology  Reason for consultation: Significant CVA - thrombectomy, but rethrombosis and then hemicraniectomy.   Resumed Eliquis on 5/23 for DVTs, has IVC filter.  Now tachycardia, fever - CXR with ?aspiration.  T103, HR 133.  Metoprolol previously ordered prn and was given it today with improvement in HR.  Sats 93% ABG pending.  Blood cultures and UA ordered, no sputum culture.    History of Present Illness: Lee Hooper is an 49 y.o. male with h/o COPD; tobacco dependence; and psoriasis who was admitted with L ICA and MCA CVA on 4/28.  He required arteriogram with revascularization/thrombectomy on 4/28 and then L hemicraniectomy on 4/28; trach placement on 5/8; and IVC filter placement on 5/9.  He had sepsis with persistent hypotension with RRT called on 5/24. He required scalp wound revision on 6/4 and is planned for cranioplasty next week.  He is able to make eye contact with his right eye and smiled at me.  No verbalization.  Apparently this is baseline for him.  His nurse reports no increased O2 requirement or increased cough, but he started with diet just a few days ago.  He has trach/PEG.    Review of Systems:  ROS Unable to perform   Past Medical History: Past Medical History:  Diagnosis Date  . COPD (chronic obstructive pulmonary disease) ([place])   . History of ischemic left MCA stroke 07/01/2019  . Psoriasis   . Tobacco dependence     Past Surgical History: Past Surgical History:  Procedure Laterality Date  . CRANIOTOMY Left 07/01/2019   Procedure: Left Hemicraniectomy;  Surgeon: Ashok Pall, MD;  Location: [place];  Service: Neurosurgery;  Laterality: Left;  . ESOPHAGOGASTRODUODENOSCOPY N/A 07/17/2019   Procedure: ESOPHAGOGASTRODUODENOSCOPY (EGD);   Surgeon: Jesusita Oka, MD;  Location[facility] ENDOSCOPY;  Service: General;  Laterality: N/A;  . IR CT HEAD LTD  07/01/2019  . IR IVC FILTER PLMT / S&I /IMG GUID/MOD SED  07/12/2019  . IR PERCUTANEOUS ART THROMBECTOMY/INFUSION INTRACRANIAL INC DIAG ANGIO  07/01/2019  . LESION EXCISION N/A 08/05/2019   Procedure: Scalp Wound Revision;  Surgeon: Ashok Pall, MD;  Location: [place];  Service: Neurosurgery;  Laterality: N/A;  left,posterior  . PEG PLACEMENT N/A 07/17/2019   Procedure: PERCUTANEOUS ENDOSCOPIC GASTROSTOMY (PEG) PLACEMENT;  Surgeon: Jesusita Oka, MD;  Location: [place];  Service: General;  Laterality: N/A;  . RADIOLOGY WITH ANESTHESIA N/A 07/01/2019   Procedure: IR WITH ANESTHESIA;  Surgeon: Radiologist, Medication, MD;  Location: [place];  Service: Radiology;  Laterality: N/A;     Allergies:   Allergies  Allergen Reactions  . Tegaderm Ag Mesh [Silver] Other (See Comments)    blisters     Social History:  reports that he has been smoking. He has never used smokeless tobacco. No history on file for alcohol use and drug use.   Family History: History reviewed. No pertinent family history.    Physical Exam: Vitals:   08/22/19 1540 08/22/19 1544 08/22/19 1647 08/22/19 1739  BP: 107/76  126/89 107/72  Pulse: (!) 124 (!) 123 (!) 123 (!) 117  Resp: (!) 27 (!) 23 (!) 29 (!) 28  Temp: (!) 102.1 F (38.9 C)  (!) 103.1 F (39.5 C) (!) 102.3 F (  39.1 C)  TempSrc: Axillary  Oral Axillary  SpO2: 97% 96% 93% 94%  Weight:      Height:        Constitutional: Opens R > L eye to voice, touch, made eye contact and smiled once.  Otherwise attempted to follow simple commands but no further apparent interaction.  Healing craniotomy scars with left fluid mass in temporal region (for cranioplasty soon) Eyes:  R eye appears normal, anicteric sclera ENT: external ears and nose appear normal Neck:  trach in place with clean base CVS: RR with tachycardia to 120s, no LE edema, normal  pedal pulses  Respiratory:  clear to auscultation bilaterally, no wheezing, rales or rhonchi. Mildly increased respiratory effort. No accessory muscle use.  Abdomen: soft nontender, nondistended, PEG tube in place with clean base Musculoskeletal: : R hemiparesis with some strength of LUE > LLE Neuro: unable to effectively perform Psych: alert and able to interact to some extent Skin: no rashes or lesions or ulcers, no induration or nodules - hips and buttocks as well as remainder of skin examined with nurse with no more than stage 1 breakdown on sacrum; psoriatic plaques on knees are prominent   Data reviewed:  I have personally reviewed the recent labs and imaging studies  Pertinent Labs:   ABG: 7.440/38.6/69.3 Na++ 134 Glucose 145 AST 28/ALT 46 Albumin 2.8 WBC 13.4 UA: rare bacteria Lactate 2.2 Procalcitonin <0.1 INR 1.3   Inpatient Medications:   Scheduled Meds: . apixaban  5 mg Per Tube BID  . chlorhexidine gluconate (MEDLINE KIT)  15 mL Mouth Rinse BID  . Chlorhexidine Gluconate Cloth  6 each Topical Daily  . feeding supplement (OSMOLITE 1.5 CAL)  237 mL Per Tube TID  . feeding supplement (PRO-STAT SUGAR FREE 64)  30 mL Per Tube TID  . free water  150 mL Per Tube Q4H  . insulin aspart  0-9 Units Subcutaneous TID WC  . mouth rinse  15 mL Mouth Rinse q12n4p  . metoprolol tartrate  25 mg Per Tube BID  . multivitamin  15 mL Per Tube Daily  . mupirocin ointment   Nasal BID  . nutrition supplement (JUVEN)  1 packet Per Tube BID BM  . pantoprazole sodium  40 mg Per Tube Daily   Continuous Infusions: . sodium chloride Stopped (07/21/19 0012)  . ceFEPime (MAXIPIME) IV 2 g (08/22/19 1609)  . lactated ringers 75 mL/hr at 08/22/19 1535  . metronidazole 500 mg (08/22/19 1545)  . [START ON 08/23/2019] vancomycin       Radiological Exams on Admission: DG Chest Port 1 View  Result Date: 08/22/2019 CLINICAL DATA:  Fever. EXAM: PORTABLE CHEST 1 VIEW COMPARISON:  08/04/2019  FINDINGS: Patient slightly rotated to the right. Tracheostomy tube in adequate position. Lungs are hypoinflated with minimal bibasilar opacification which may be due to atelectasis or infection. No effusion. Cardiomediastinal silhouette and remainder of the exam is unchanged. IMPRESSION: Minimal bibasilar opacification which may be due to atelectasis or infection. Electronically Signed   By: Marin Olp M.D.   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 Annamarie Dawley regarding possible CNS source but he reports that this exceedingly unlikely at this juncture -SIRS criteria in this patient includes: Leukocytosis, fever, tachycardia, tachypnea  -Patient has evidence of acute organ failure with elevated lactate >2 that is not easily explained by another condition. -While awaiting blood cultures, this appears to be a preseptic condition. -Sepsis protocol initiated -Blood, urine, and sputum cultures pending -Treat with IV Cefepime/Vanc/Flagyl for undifferentiated sepsis -Will trend lactate to ensure improvement -Will  order lower respiratory tract procalcitonin level.  >0.5 indicates infection and >>0.5 indicates more serious disease.  As the procalcitonin level normalizes, it will be reasonable to consider de-escalation of antibiotic coverage. -Will resume NPO status but continue tube feeds for now.  CVA -Severe CVA with ongoing disability -Trach/PEG in place -Hold diet for now pending further evaluation -Planned for cranioplasty soon but may need to delay depending on infection  DVT -Extensive B LE DVT on imaging on 5/8 -IVC filter placed due to neurosurgical contraindications at that time to [facility] -On Eliquis since 5/23 -Unlikely to be related to current issue  Pressure injury -Stage 1 sacral ulcer, appears unrelated to current sepsis  Psoriasis -No apparent treatment as outpatient  Hyperglycemia -A1c on 4/29 was 5.5 so not diabetic -Has SSI to cover if needed    Thank you for this consultation.  Our [facility] hospitalist team will follow the patient with you.   Time Spent: 25 minutes  Karmen Bongo M.D. [facility] 08/22/2019, 6:20 PM

## 2019-08-22 NOTE — Progress Notes (Addendum)
STROKE PROGRESS NOTE INTERVAL HISTORY  The patient has become acutely tachycardic 130s and tachypneic 20-30.  He also is spiked a fever 102.  Vitals:   08/22/19 1400 08/22/19 1445 08/22/19 1540 08/22/19 1544  BP: 113/72 112/81 107/76   Pulse: (!) 112 (!) 115 (!) 124 (!) 123  Resp: (!) 28 (!) 27 (!) 27 (!) 23  Temp: (!) 101.5 F (38.6 C) (!) 101.6 F (38.7 C) (!) 102.1 F (38.9 C)   TempSrc:  Axillary Axillary   SpO2: 96% 96% 97% 96%  Weight:      Height:       CBC:  Recent Labs  Lab 08/19/19 0652 08/22/19 1255  WBC 6.3 13.4*  HGB 14.3 14.2  HCT 45.0 43.9  MCV 94.9 93.8  PLT 191 225   Basic Metabolic Panel:  Recent Labs  Lab 08/19/19 0652 08/22/19 1255  NA 137 134*  K 4.2 3.8  CL 102 100  CO2 26 24  GLUCOSE 101* 145*  BUN 20 23*  CREATININE 0.60* 0.84  CALCIUM 9.6 9.3    IMAGING past 24 hours DG Chest Port 1 View  Result Date: 08/22/2019 CLINICAL DATA:  Fever. EXAM: PORTABLE CHEST 1 VIEW COMPARISON:  08/04/2019 FINDINGS: Patient slightly rotated to the right. Tracheostomy tube in adequate position. Lungs are hypoinflated with minimal bibasilar opacification which may be due to atelectasis or infection. No effusion. Cardiomediastinal silhouette and remainder of the exam is unchanged. IMPRESSION: Minimal bibasilar opacification which may be due to atelectasis or infection. Electronically Signed   By: Elberta Fortis M.D.   On: 08/22/2019 13:25    PHYSICAL EXAM  General- middle-aged Caucasian male, s/p tracheostomy on trach collar, sleeping but arousable.   Ophthalmologic- fundi not visualized due to noncooperation.  Cardiovascular - Regular rate and rhythm.  Neuro - s/p tracheostomy on trach collar.  Eyes open but the patient is mute.  Global aphasia, left gaze preference position, barely cross midline, notblinking to visual threat on the left, PERRL. Right facial droop. Tongue protrusion not cooperative. LUE spontaneous movement at least 4/5, LLE against  gravity at least 3/5, right UE and LE flaccid still and does not seem to be developing rigidity or spasticity at this time, slight 1/5 movement with pain at RLE no withdrawal to pain in upper. Sensation, coordination and gait not tested. Not cooperative this morning.    ASSESSMENT/PLAN Lee Hooper is a 49 y.o. male with history of COPD, ongoing tobacco use, and psoriasis, presenting with dense LMCA syndrome. Attempt for Left ICA and Left M1 thrombectomy was done emergently with TICI 2b flow. Unfortunately, the pt had post procedure hemorrhage and reocclusion of the vessels. F/u imaging suggested early edema and pt underwent prophylactic decompressive hemicraniectomy.  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 Stroke etiology unknown - given DVT requiring eventual AC and high mRS, will not do further embolic workup  Eliquis for VTE prophylaxis  No antithrombotic prior to admission, now on Eliquis.  Therapy recommendations:  CIR   Disposition:  pending  Dr. Franky Macho  for cranioplasty, earliest next week.   Respiratory failure s/p Trach Trach wound  Downsized to cuffless trach 6 by CCM 5/24  Fever Tachycardia  Temp - 103.2->102.9->102.2  Tachycardia - 127->133->130->86 (metoprolol given by nursing per standing order)   Known DVT  IVC filter placed 07/12/19   Eliquis restarted 07/26/19 (5 mg Bid)  Left depressive hemicraniectomy on 07/01/19 Dr Christella Noa.  Cranioplasty planned next week  ABGs,  CMP, CBC, UA, CXR, Blood cultures pending  Have asked Dr Lorin Mercy to consult.  Chest x-ray shows bibasilar opacities.  Hyperglycemia  On TF  Added SSI  Bilateral Lower Ext DVT,  extensive Possible PE given tachycardia  Venous US 07/11/2019 DVT + bilateral extensive  IVC filter placed 5/9  On eliquis now for DVT treatment and prophylaxis  Dysphagia   PEG placement 5/14  FW 150 Q4h   On bolus TF -> cut in half to facilitate po intake   MBSS cleared for D1 nectar thick liquids  SLP following for tolerance  Tobacco abuse  Current smoker  Smoking cessation counseling will be provided if able  Other Stroke Risk Factors  Obesity, Body mass index is 27.57 kg/m., recommend weight loss, diet and exercise as appropriate    Likely undiagnosed obstructive sleep apnea based on body habitus  Other Active Problems     Hospital day # 52   This patient is critically ill due to acute tachycardia and dyspnea with significant risk of neurological worsening, death form severe anemia, bleeding, recurrent stroke, intracranial stenosis. This patient's care requires constant monitoring of vital signs, hemodynamics, respiratory and cardiacmonitoring, review of multiple databases, neurological assessment, other specialists and medical decision making of high complexity. I spent50 minutes of neurocritical care time in the care of this patient. Initial concerns were that the patient may have had a pulmonary embolism.  Patient however has had a IVC filter placed and was started on Eliquis yesterday.  The patient is now thought to likely have had aspiration.  He has been in a soft diet recently.  Consultation was placed to hospitalist to help with the potential pneumonia and acute decompensation.     To contact Stroke Continuity provider, please refer to [email or web address] After hours, contact General Neurology

## 2019-08-23 DIAGNOSIS — A419 Sepsis, unspecified organism: Secondary | ICD-10-CM

## 2019-08-23 LAB — LACTIC ACID, PLASMA: Lactic Acid, Venous: 1.7 mmol/L (ref 0.5–1.9)

## 2019-08-23 LAB — GLUCOSE, CAPILLARY
Glucose-Capillary: 129 mg/dL — ABNORMAL HIGH (ref 70–99)
Glucose-Capillary: 195 mg/dL — ABNORMAL HIGH (ref 70–99)
Glucose-Capillary: 119 mg/dL — ABNORMAL HIGH (ref 70–99)
Glucose-Capillary: 113 mg/dL — ABNORMAL HIGH (ref 70–99)

## 2019-08-23 MED ORDER — ACETAMINOPHEN 325 MG PO TABS
650.0000 mg | ORAL_TABLET | Freq: Four times a day (QID) | ORAL | Status: DC | PRN
  Administered 2019-08-24 – 2019-08-27 (×8): 650 mg
  Filled 2019-08-23 (×9): qty 2

## 2019-08-23 NOTE — Progress Notes (Signed)
   08/23/19 0900  Vitals  Temp (!) 100.8 F (38.2 C)  Temp Source Axillary  BP 119/77  MAP (mmHg) 89  BP Location Left Arm  BP Method Automatic  Patient Position (if appropriate) Lying  Pulse Rate (!) 113  Pulse Rate Source Monitor  ECG Heart Rate (!) 114  Cardiac Rhythm ST  Resp (!) 27  Level of Consciousness  Level of Consciousness Alert  Oxygen Therapy  SpO2 93 %  O2 Device Tracheostomy Collar  O2 Flow Rate (L/min) 5 L/min  FiO2 (%) 21 %  Patient Activity (if Appropriate) In bed  Pulse Oximetry Type Continuous  SpO2 Alarm Limit Low 95  Oximetry Probe Site Changed No  Pain Assessment  Pain Scale Faces  Faces Pain Scale 0  MEWS Score  MEWS Temp 1  MEWS Systolic 0  MEWS Pulse 2  MEWS RR 2  MEWS LOC 0  MEWS Score 5  MEWS Score Color Red   Paged D. Rinehauls, PA to inform of RED MEWS 5, and temp of 100.8. Provider made aware.

## 2019-08-23 NOTE — Progress Notes (Signed)
PROGRESS NOTE  Lee Hooper  DOB: 09-Aug-1970  PCP: Patient, No Pcp Per MWN:[id number]  DOA: 07/01/2019  LOS: 77 days   Chief Complaint  Patient presents with  . Code Stroke   Brief narrative: BRANSON KRANZ is a 49 y.o. male , chronic daily smoker with COPD and psoriasis who was admitted on 4/28 primarily under neurology service.   Medicine consultation was: 6/19 for comanagement of medical issues.    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  Underwent arteriogram with revascularization/thrombectomy but had rethrombosis and hence underwent L depressive hemicraniectomy.  He remained intubated in ICU postprocedure.  Could not be extubated. 5/8 -trach placement.  Also found to have bilateral lower extremity DVT. 5/9 -IVC filter placement.  5/23 -Eliquis resumed for DVT 6/4 - required scalp wound revision on 6/4 and is planned for cranioplasty next week.  6/6 - transfered out of ICU. 6/17 -started on dysphagia diet 6/19 -started having fever up to 103.2, tachycardia.  Chest x-ray with possible aspiration.  Sepsis work-up started and hospitalist service was consulted.  Subjective: Patient was seen and examined this morning.  Middle-aged Caucasian male.  Alert, nods head on calling his name.  Has dense hemiplegia of the right side.  Unable to follow verbal commands.  On giving gesture to move his left side, he is able to do so. Chart reviewed.  Patient has remained persistently febrile last 24 hours between 101-103 Tachycardic between 100 and 140 On 5 L oxygen by trach collar Labs from last night with sodium 131, glucose 150, renal function normal, lactic acid was 3.3, down to 1.7 this morning. WBC count was 16, higher than 13 the other day.  Assessment/Plan: Sepsis likely secondary to aspiration pneumonia -Unfortunate young man with catastrophic CVA in April, still recovering with dense R hemiparesis -6-17 - started on Dysphagia 1 diet on  6/17 -6/19 -started having fever, tachycardia. -CXR with possible bibasilar opacification, not overly convincing -Lactic acid is elevated.  However procalcitonin level is less than 0.1. -Started on sepsis protocol with IV fluid, blood culture, urine culture and initiation of broad-spectrum IV antibiotics. -Currently on IV Cefepime/Vanc/Flagyl for undifferentiated sepsis -Lactic acid normal on repeat check this morning. -Oral feeding held.  Continue tube feeding.  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  Underwent arteriogram with revascularization/thrombectomy but had rethrombosis and hence underwent L depressive hemicraniectomy.  -Trach/PEG in place -Planned for cranioplasty soon but may need to delay depending on infection  DVT -5/8 -extensive B LE DVT -5/9  -IVC filter placed  -5/23 -Eliquis initiated  Pressure injury -Stage 1 sacral ulcer, appears unrelated to current sepsis  Psoriasis -No apparent treatment as outpatient  Hyperglycemia - A1c 5.5 on 4/29 -Continue sliding scale with Accu-Cheks  Nutrition Problem: Inadequate oral intake Etiology: inability to eat  Signs/Symptoms: NPO status  Mobility: PT following Code Status:   Code Status: Prior  DVT prophylaxis:  Eliquis Antimicrobials:  Cefepime, metronidazole, vancomycin Fluid: Continue LR at 75.  Diet: PEG tube feeding ongoing.  Oral feeding has been held.  Family Communication:  Called and updated patient's wife this afternoon.   Infusions:  . sodium chloride Stopped (07/21/19 0012)  . acetaminophen 1,000 mg (08/23/19 1149)  . ceFEPime (MAXIPIME) IV 2 g (08/23/19 0609)  . lactated ringers 75 mL/hr at 08/23/19 0613  . metronidazole 500 mg (08/23/19 4034)  . vancomycin 1,250 mg (08/23/19 0450)    Scheduled Meds: . apixaban  5 mg  Per Tube BID  . chlorhexidine gluconate (MEDLINE KIT)  15 mL Mouth Rinse BID  . Chlorhexidine Gluconate Cloth  6 each Topical Daily  . feeding supplement  (OSMOLITE 1.5 CAL)  237 mL Per Tube TID  . feeding supplement (PRO-STAT SUGAR FREE 64)  30 mL Per Tube TID  . free water  150 mL Per Tube Q4H  . insulin aspart  0-9 Units Subcutaneous TID WC  . mouth rinse  15 mL Mouth Rinse q12n4p  . metoprolol tartrate  25 mg Per Tube BID  . multivitamin  15 mL Per Tube Daily  . mupirocin ointment   Nasal BID  . nutrition supplement (JUVEN)  1 packet Per Tube BID BM  . pantoprazole sodium  40 mg Per Tube Daily    Antimicrobials: Anti-infectives (From admission, onward)   Start     Dose/Rate Route Frequency Ordered Stop   08/23/19 0400  vancomycin (VANCOREADY) IVPB 1250 mg/250 mL     Discontinue     1,250 mg 166.7 mL/hr over 90 Minutes Intravenous Every 12 hours 08/22/19 1526     08/22/19 1530  metroNIDAZOLE (FLAGYL) IVPB 500 mg     Discontinue     500 mg 100 mL/hr over 60 Minutes Intravenous Every 8 hours 08/22/19 1511     08/22/19 1530  ceFEPIme (MAXIPIME) 2 g in sodium chloride 0.9 % 100 mL IVPB     Discontinue     2 g 200 mL/hr over 30 Minutes Intravenous Every 8 hours 08/22/19 1525     08/22/19 1530  vancomycin (VANCOREADY) IVPB 1750 mg/350 mL        1,750 mg 175 mL/hr over 120 Minutes Intravenous  Once 08/22/19 1526 08/22/19 1814   08/22/19 1515  ceFEPIme (MAXIPIME) 2 g in sodium chloride 0.9 % 100 mL IVPB  Status:  Discontinued        2 g 200 mL/hr over 30 Minutes Intravenous  Once 08/22/19 1511 08/22/19 1525   08/22/19 1515  vancomycin (VANCOCIN) IVPB 1000 mg/200 mL premix  Status:  Discontinued        1,000 mg 200 mL/hr over 60 Minutes Intravenous  Once 08/22/19 1511 08/22/19 1525   07/28/19 0800  vancomycin (VANCOCIN) IVPB 1000 mg/200 mL premix  Status:  Discontinued        1,000 mg 200 mL/hr over 60 Minutes Intravenous Every 8 hours 07/27/19 2300 07/30/19 1522   07/27/19 2300  ceFEPIme (MAXIPIME) 2 g in sodium chloride 0.9 % 100 mL IVPB  Status:  Discontinued        2 g 200 mL/hr over 30 Minutes Intravenous Every 8 hours 07/27/19  2251 08/02/19 0927   07/27/19 2300  vancomycin (VANCOREADY) IVPB 2000 mg/400 mL        2,000 mg 200 mL/hr over 120 Minutes Intravenous  Once 07/27/19 2258 07/28/19 0130   07/27/19 2245  ceFEPIme (MAXIPIME) 2 g in sodium chloride 0.9 % 100 mL IVPB  Status:  Discontinued        2 g 200 mL/hr over 30 Minutes Intravenous Every 12 hours 07/27/19 2244 07/27/19 2251   07/17/19 1400  ceFAZolin (ANCEF) IVPB 2g/100 mL premix        2 g 200 mL/hr over 30 Minutes Intravenous Every 8 hours 07/17/19 0922 07/20/19 2213   07/16/19 1400  ceFEPIme (MAXIPIME) 2 g in sodium chloride 0.9 % 100 mL IVPB  Status:  Discontinued        2 g 200 mL/hr over 30 Minutes Intravenous Every  8 hours 07/16/19 1132 07/17/19 0922   07/16/19 1200  vancomycin (VANCOREADY) IVPB 1250 mg/250 mL  Status:  Discontinued        1,250 mg 166.7 mL/hr over 90 Minutes Intravenous Every 24 hours 07/15/19 1333 07/17/19 0923   07/15/19 2200  ceFEPIme (MAXIPIME) 2 g in sodium chloride 0.9 % 100 mL IVPB  Status:  Discontinued        2 g 200 mL/hr over 30 Minutes Intravenous Every 12 hours 07/15/19 1253 07/16/19 1132   07/15/19 1200  vancomycin (VANCOREADY) IVPB 1500 mg/300 mL  Status:  Discontinued        1,500 mg 150 mL/hr over 120 Minutes Intravenous Every 24 hours 07/14/19 1037 07/15/19 1334   07/14/19 1400  ceFEPIme (MAXIPIME) 2 g in sodium chloride 0.9 % 100 mL IVPB  Status:  Discontinued        2 g 200 mL/hr over 30 Minutes Intravenous Every 8 hours 07/14/19 1015 07/15/19 1253   07/14/19 1045  vancomycin (VANCOREADY) IVPB 1750 mg/350 mL        1,750 mg 175 mL/hr over 120 Minutes Intravenous  Once 07/14/19 1037 07/14/19 1455   07/08/19 1100  Ampicillin-Sulbactam (UNASYN) 3 g in sodium chloride 0.9 % 100 mL IVPB        3 g 200 mL/hr over 30 Minutes Intravenous Every 6 hours 07/08/19 1057 07/09/19 2319   07/07/19 2200  amoxicillin-clavulanate (AUGMENTIN) 875-125 MG per tablet 1 tablet  Status:  Discontinued        1 tablet Per Tube  Every 12 hours 07/07/19 1546 07/08/19 1057   07/05/19 1500  amoxicillin-clavulanate (AUGMENTIN) 875-125 MG per tablet 1 tablet  Status:  Discontinued        1 tablet Per Tube Every 12 hours 07/05/19 1214 07/07/19 1546   07/03/19 1000  ceFEPIme (MAXIPIME) 2 g in sodium chloride 0.9 % 100 mL IVPB  Status:  Discontinued        2 g 200 mL/hr over 30 Minutes Intravenous Every 8 hours 07/03/19 0926 07/05/19 1214   07/01/19 0923  ceFAZolin (ANCEF) 2-4 GM/100ML-% IVPB       Note to Pharmacy: Domenick Bookbinder   : cabinet override      07/01/19 0923 07/01/19 2129      PRN meds: sodium chloride, acetaminophen, acetaminophen, fentaNYL (SUBLIMAZE) injection, iohexol, ipratropium-albuterol, metoprolol tartrate, ondansetron **OR** ondansetron (ZOFRAN) IV, promethazine   Objective: Vitals:   08/23/19 1215 08/23/19 1300  BP:  105/74  Pulse: (!) 116 (!) 106  Resp: (!) 28 (!) 26  Temp:  99.6 F (37.6 C)  SpO2: 94% 94%   No intake or output data in the 24 hours ending 08/23/19 1313 Filed Weights   08/21/19 0418 08/22/19 0427 08/23/19 0500  Weight: 92.7 kg 92.2 kg 97 kg   Weight change: 4.8 kg Body mass index is 29 kg/m.   Physical Exam: General exam: Middle-aged Caucasian male.  Eyes open.  Nods head to acknowledge my presence.  Not in physical distress Skin: No rashes, lesions or ulcers. HEENT: Atraumatic, normocephalic, supple neck, no obvious bleeding Lungs: Clear to auscultation bilaterally CVS: Tachycardic, no murmur GI/Abd soft, nontender, nondistended, bowel sound present CNS: Alert, awake, unable to follow verbal command.  Able to follow gesture and move left side.  Right dense hemiplegia. Psychiatry: Depressed look Extremities: No pedal edema, no calf tenderness  Data Review: I have personally reviewed the laboratory data and studies available.  Recent Labs  Lab 08/17/19 0454 08/19/19 0652 08/22/19 1255  08/22/19 1915  WBC 6.5 6.3 13.4* 16.0*  HGB 13.4 14.3 14.2 15.7  HCT  42.5 45.0 43.9 49.2  MCV 96.2 94.9 93.8 96.1  PLT 201 191 225 242   Recent Labs  Lab 08/17/19 0454 08/19/19 0652 08/22/19 1255 08/22/19 1915  NA 137 137 134* 131*  K 4.3 4.2 3.8 4.3  CL 99 102 100 97*  CO2 _0 GLUCOSE 105* 101* 145* 150*  BUN 20 20 23* 21*  CREATININE 0.67 0.60* 0.84 0.87  CALCIUM 9.3 9.6 9.3 9.2    Signed, Terrilee Croak, MD [facility] Pager: [phone] (Secure Chat preferred). 08/23/2019

## 2019-08-23 NOTE — Progress Notes (Addendum)
   08/23/19 1500  Assess: MEWS Score  Temp 99.7 F (37.6 C)  BP 115/82  Pulse Rate (!) 113  ECG Heart Rate (!) 112  Resp (!) 28  Level of Consciousness Alert  SpO2 93 %  O2 Device Tracheostomy Collar  Patient Activity (if Appropriate) In bed  O2 Flow Rate (L/min) 5 L/min  FiO2 (%) 21 %  Assess: MEWS Score  MEWS Temp 0  MEWS Systolic 0  MEWS Pulse 2  MEWS RR 2  MEWS LOC 0  MEWS Score 4  MEWS Score Color Red  Assess: if the MEWS score is Yellow or Red  Were vital signs taken at a resting state? Yes  Focused Assessment Documented focused assessment  Early Detection of Sepsis Score *See Row Information* Medium  MEWS guidelines implemented *See Row Information* Yes (not new event )  Treat  MEWS Interventions Other (Comment) (not new event, continue to monitor)  Take Vital Signs  Increase Vital Sign Frequency   (not new event continue to monitor)  Escalate  MEWS: Escalate  (not new event continue to monitor)  Notify: Charge Nurse/RN  Name of Charge Nurse/RN Notified Courtlanda, RN  Date Charge Nurse/RN Notified 08/22/19  Time Charge Nurse/RN Notified 1500  Notify: Provider  Provider Name/Title Dr. Lucky Cowboy, MD  Date Provider Notified 08/22/19  Time Provider Notified 1500  Notification Type Page  Notification Reason Other (Comment) (not new event continue to monitor)  Response No new orders  Date of Provider Response 08/23/19  Time of Provider Response 1500  Document  Patient Outcome Stabilized after interventions  Progress note created (see row info) Yes   **Note: Date notified entered in error; should be 08/23/19. LVeto Kemps

## 2019-08-23 NOTE — Progress Notes (Signed)
STROKE PROGRESS NOTE INTERVAL HISTORY  Unfortunately, patient continues to be febrile 103.  He is also shivering.  Hospitalist is helping with management.  He is on multiple antibiotics.  Vitals:   08/23/19 1300 08/23/19 1400 08/23/19 1500 08/23/19 1600  BP: 105/74 108/73 115/82 126/83  Pulse: (!) 106 (!) 105 (!) 113 (!) 112  Resp: (!) 26 (!) 25 (!) 28 (!) 25  Temp: 99.6 F (37.6 C) 99.8 F (37.7 C) 99.7 F (37.6 C) 100 F (37.8 C)  TempSrc: Oral Oral Oral Oral  SpO2: 94% 94% 93% 94%  Weight:      Height:       CBC:  Recent Labs  Lab 08/22/19 1255 08/22/19 1915  WBC 13.4* 16.0*  HGB 14.2 15.7  HCT 43.9 49.2  MCV 93.8 96.1  PLT 225 242   Basic Metabolic Panel:  Recent Labs  Lab 08/22/19 1255 08/22/19 1915  NA 134* 131*  K 3.8 4.3  CL 100 97*  CO2 24 24  GLUCOSE 145* 150*  BUN 23* 21*  CREATININE 0.84 0.87  CALCIUM 9.3 9.2    IMAGING past 24 hours No results found.  PHYSICAL EXAM  General- middle-aged Caucasian male, s/p tracheostomy on trach collar, sleeping but arousable.   Ophthalmologic- fundi not visualized due to noncooperation.  Cardiovascular - Regular rate and rhythm.  Neuro - s/p tracheostomy on trach collar.  Eyes open but the patient is mute.  Currently febrile 103 and shivering.  Global aphasia, left gaze preference position, barely cross midline, notblinking to visual threat on the left, PERRL. Right facial droop. Tongue protrusion not cooperative. LUE spontaneous movement at least 4/5, LLE against gravity at least 3/5, right UE and LE flaccid still and does not seem to be developing rigidity or spasticity at this time, slight 1/5 movement with pain at RLE no withdrawal to pain in upper. Sensation, coordination and gait not tested. Not cooperative this morning.    ASSESSMENT/PLAN Lee Hooper is a 49 y.o. male with history of COPD, ongoing tobacco use, and psoriasis, presenting with dense LMCA syndrome. Attempt for Left ICA and  Left M1 thrombectomy was done emergently with TICI 2b flow. Unfortunately, the pt had post procedure hemorrhage and reocclusion of the vessels. F/u imaging suggested early edema and pt underwent prophylactic decompressive hemicraniectomy.  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 Stroke etiology unknown - given DVT requiring eventual AC and high mRS, will not do further embolic workup  Eliquis for VTE prophylaxis  No antithrombotic prior to admission, now on Eliquis.  Therapy recommendations:  CIR   Disposition:  Pending  Left depressive hemicraniectomy on 07/01/19 Dr Franky Macho  Dr. Franky Macho  for cranioplasty, earliest next week.   Respiratory failure s/p Trach Trach wound  Downsized to cuffless trach 6 by CCM 5/24  Fever Tachycardia  Leukocytosis (believed aspiration pneumonia ->sepsis)  Temp - 103.2->102.9->102.2->100  Tachycardia - 127->133->130->86-> (metoprolol given by nursing per standing order) ->112  Known DVT - possible PE? - Chest CTA considered but pt already on Eliquis  IVC filter placed 07/12/19   Eliquis restarted 07/26/19 (5 mg Bid)  ABGs 08/22/19 - WNL except PO2 - 69.3  (CMP - unremarkable)  ;  (  CBC - WBCs 13.4->16.0)  ;  (UA - cloudy with rare bacteria)  CXR - 6/19 - Minimal bibasilar opacification which may be due to atelectasis or infection.  Blood cultures -  No growth < 24 hrs  Lactic Acid - 2.2->3.3->1.7  Procalcitonin < 0.10  Respiratory cultures - pending  Dr Lorin Mercy consulting  Maxipime, Metronidazole and Vancomycin started 08/22/19 for Sepsis   Labs in AM   Appreciate Hospitalists' assistance  Hyperglycemia  On TF  Added SSI  Bilateral Lower Ext DVT,  extensive Possible PE given tachycardia  Venous US 07/11/2019 DVT + bilateral extensive  IVC filter placed 5/9  On eliquis now for DVT treatment and prophylaxis  Dysphagia   PEG placement 5/14  FW 150 Q4h   On bolus TF -> cut in half to facilitate po intake   MBSS cleared for D1 nectar thick liquids  SLP following for tolerance  Tobacco abuse  Current smoker  Smoking cessation counseling will be provided if able  Other Stroke Risk Factors  Obesity, Body mass index is 29 kg/m., recommend weight loss, diet and exercise as appropriate    Likely undiagnosed obstructive sleep apnea based on body habitus  Other Active Problems  [facility] day # 53       To contact Stroke Continuity provider, please refer to [email or web address] After hours, contact General Neurology

## 2019-08-24 ENCOUNTER — Inpatient Hospital Stay (HOSPITAL_COMMUNITY): Payer: Medicaid Other

## 2019-08-24 LAB — PHOSPHORUS: Phosphorus: 2.6 mg/dL (ref 2.5–4.6)

## 2019-08-24 LAB — BASIC METABOLIC PANEL
Anion gap: 9 (ref 5–15)
BUN: 13 mg/dL (ref 6–20)
CO2: 22 mmol/L (ref 22–32)
Calcium: 8.7 mg/dL — ABNORMAL LOW (ref 8.9–10.3)
Chloride: 107 mmol/L (ref 98–111)
Creatinine, Ser: 0.65 mg/dL (ref 0.61–1.24)
GFR calc Af Amer: >60 mL/min (ref >60)
GFR calc non Af Amer: >60 mL/min (ref >60)
Glucose, Bld: 106 mg/dL — ABNORMAL HIGH (ref 70–99)
Potassium: 3.6 mmol/L (ref 3.5–5.1)
Sodium: 138 mmol/L (ref 135–145)

## 2019-08-24 LAB — CBC WITH DIFFERENTIAL/PLATELET
Abs Immature Granulocytes: 0.07 10*3/uL (ref 0.00–0.07)
Basophils Absolute: 0 10*3/uL (ref 0.0–0.1)
Basophils Relative: 0 %
Eosinophils Absolute: 0.1 10*3/uL (ref 0.0–0.5)
Eosinophils Relative: 1 %
HCT: 38.5 % — ABNORMAL LOW (ref 39.0–52.0)
Hemoglobin: 12 g/dL — ABNORMAL LOW (ref 13.0–17.0)
Immature Granulocytes: 1 %
Lymphocytes Relative: 14 %
Lymphs Abs: 1.7 10*3/uL (ref 0.7–4.0)
MCH: 30.5 pg (ref 26.0–34.0)
MCHC: 31.2 g/dL (ref 30.0–36.0)
MCV: 97.7 fL (ref 80.0–100.0)
Monocytes Absolute: 1.4 10*3/uL — ABNORMAL HIGH (ref 0.1–1.0)
Monocytes Relative: 11 %
Neutro Abs: 9.3 10*3/uL — ABNORMAL HIGH (ref 1.7–7.7)
Neutrophils Relative %: 73 %
Platelets: 164 10*3/uL (ref 150–400)
RBC: 3.94 MIL/uL — ABNORMAL LOW (ref 4.22–5.81)
RDW: 16.1 % — ABNORMAL HIGH (ref 11.5–15.5)
WBC: 12.5 10*3/uL — ABNORMAL HIGH (ref 4.0–10.5)
nRBC: 0 % (ref 0.0–0.2)

## 2019-08-24 LAB — HEPATIC FUNCTION PANEL
ALT: 41 U/L (ref 0–44)
AST: 24 U/L (ref 15–41)
Albumin: 2.2 g/dL — ABNORMAL LOW (ref 3.5–5.0)
Alkaline Phosphatase: 54 U/L (ref 38–126)
Bilirubin, Direct: 0.1 mg/dL (ref 0.0–0.2)
Indirect Bilirubin: 0.6 mg/dL (ref 0.3–0.9)
Total Bilirubin: 0.7 mg/dL (ref 0.3–1.2)
Total Protein: 5.9 g/dL — ABNORMAL LOW (ref 6.5–8.1)

## 2019-08-24 LAB — GLUCOSE, CAPILLARY
Glucose-Capillary: 172 mg/dL — ABNORMAL HIGH (ref 70–99)
Glucose-Capillary: 107 mg/dL — ABNORMAL HIGH (ref 70–99)
Glucose-Capillary: 145 mg/dL — ABNORMAL HIGH (ref 70–99)
Glucose-Capillary: 129 mg/dL — ABNORMAL HIGH (ref 70–99)
Glucose-Capillary: 79 mg/dL (ref 70–99)

## 2019-08-24 LAB — MRSA PCR SCREENING: MRSA by PCR: NEGATIVE

## 2019-08-24 LAB — MAGNESIUM: Magnesium: 1.9 mg/dL (ref 1.7–2.4)

## 2019-08-24 LAB — VANCOMYCIN, TROUGH: Vancomycin Tr: 9 ug/mL — ABNORMAL LOW (ref 15–20)

## 2019-08-24 MED ORDER — SODIUM CHLORIDE 0.9% FLUSH
10.0000 mL | Freq: Two times a day (BID) | INTRAVENOUS | Status: DC
  Administered 2019-08-24 – 2019-08-27 (×7): 10 mL

## 2019-08-24 MED ORDER — VANCOMYCIN HCL 1500 MG/300ML IV SOLN
1500.0000 mg | Freq: Two times a day (BID) | INTRAVENOUS | Status: DC
  Administered 2019-08-24 – 2019-08-26 (×5): 1500 mg via INTRAVENOUS
  Filled 2019-08-24 (×6): qty 300

## 2019-08-24 MED ORDER — SODIUM CHLORIDE 0.9% FLUSH: 10.0000 mL | INTRAVENOUS | Status: DC | PRN

## 2019-08-24 MED ORDER — OSMOLITE 1.5 CAL PO LIQD
474.0000 mL | Freq: Three times a day (TID) | ORAL | Status: DC
  Administered 2019-08-24 – 2019-08-28 (×11): 474 mL
  Filled 2019-08-24 (×14): qty 474

## 2019-08-24 MED ORDER — IBUPROFEN 100 MG/5ML PO SUSP
400.0000 mg | Freq: Three times a day (TID) | ORAL | Status: DC | PRN
  Administered 2019-08-24 – 2019-08-27 (×8): 400 mg via ORAL
  Filled 2019-08-24 (×11): qty 20

## 2019-08-24 MED ORDER — KETOROLAC TROMETHAMINE 30 MG/ML IJ SOLN
30.0000 mg | Freq: Three times a day (TID) | INTRAMUSCULAR | Status: AC | PRN
  Administered 2019-08-25 – 2019-08-26 (×3): 30 mg via INTRAVENOUS
  Filled 2019-08-24 (×4): qty 1

## 2019-08-24 NOTE — Progress Notes (Signed)
Physical Therapy Treatment Patient Details Name: Lee Hooper MRN: [id number] DOB: [birth year] Today's Date: 08/24/2019    History of Present Illness This 49 y.o. male admitted 4/28 with Rt sided hemiplegia and aphasia.  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  Repeat CT following procedure showed moderate basal ganglia hemorrhage vs. stroke   Repeat MRI revealed that the ICA and MCA re-occluded with change in FLAIR sequence excluding him from repeat thrombectomy.  He underwent a decompressive hemicraniectomy.  He was intubated 4/28, trach 5/8; liberated from ventilator 5/14.  He developed an ileus 5/2.  5/9 IVC filter placed due to bil LE DVTs (extensive). PEG 5/14. PMH includes: COPD, tobacco abuse, psoriasis    PT Comments    Patient seen for mobility progression. Pt in bed upon arrival and agreeable to participate in therapy. Pt with fever this am and overall appears to not feel as well as last time seen by therapy. This session focused on bed mobility and sitting balance EOB. Pt with what appeared to be bilat LE muscle spasms, holding onto L thigh, and grimacing when returned to supine. RN notified. PT will continue to follow acutely and progress as tolerated.    Follow Up Recommendations  CIR     Equipment Recommendations  Wheelchair (measurements PT);Wheelchair cushion (measurements PT);Hospital bed    Recommendations for Other Services       Precautions / Restrictions Precautions Precautions: Fall Precaution Comments: R hemi, trach (has PMSV), PEG, crani-No Bone flap L head; rt shoulder pain/subluxation; bil LE DVTs with IVC filter    Mobility  Bed Mobility Overal bed mobility: Needs Assistance Bed Mobility: Sit to Sidelying;Supine to Sit           General bed mobility comments: total A +2 for all aspects of bed mobility with multimodal cues for sequencing; pt attempting to use rail with L UE   Transfers                     Ambulation/Gait                 Stairs             Wheelchair Mobility    Modified Rankin (Stroke Patients Only) Modified Rankin (Stroke Patients Only) Pre-Morbid Rankin Score: No symptoms Modified Rankin: Severe disability     Balance Overall balance assessment: Needs assistance Sitting-balance support: Feet supported;Single extremity supported Sitting balance-Leahy Scale: Poor Sitting balance - Comments: pt able to maintain sitting balance EOB holding onto rail with L UE with min guard assist for safety initially; pt required increased assistance when becoming fatigued and attempting to lie back down in bed Postural control: Right lateral lean                                  Cognition Arousal/Alertness: Awake/alert Behavior During Therapy: Flat affect Overall Cognitive Status: Impaired/Different from baseline Area of Impairment: Attention;Following commands;Problem solving;Awareness;Safety/judgement                   Current Attention Level: Sustained   Following Commands: Follows one step commands with increased time;Follows one step commands inconsistently Safety/Judgement: Decreased awareness of deficits;Decreased awareness of safety Awareness: Intellectual Problem Solving: Slow processing;Requires verbal cues;Requires tactile cues;Decreased initiation;Difficulty sequencing General Comments: pt did not attempt to verbalize during session; answers with head nods yes/no but inconsistent in responses  Exercises      General Comments        Pertinent Vitals/Pain Pain Assessment: Faces Faces Pain Scale: Hurts even more Pain Location: pt grimacing and holding L thigh and appeared to have muscle spasms; pt nodded yes to bilat LE pain  Pain Descriptors / Indicators: Discomfort;Grimacing;Spasm;Other (Comment) (tremors/shaking of bilat LE ) Pain Intervention(s): Limited activity within patient's tolerance;Monitored during  session;Repositioned;Other (comment) (RN notified )    Home Living                      Prior Function            PT Goals (current goals can now be found in the care plan section) Progress towards PT goals: Progressing toward goals    Frequency    Min 3X/week      PT Plan Current plan remains appropriate    Co-evaluation PT/OT/SLP Co-Evaluation/Treatment: Yes Reason for Co-Treatment: Complexity of the patient's impairments (multi-system involvement);Necessary to address cognition/behavior during functional activity;For patient/therapist safety;To address functional/ADL transfers PT goals addressed during session: Mobility/safety with mobility;Balance        AM-PAC PT "6 Clicks" Mobility   Outcome Measure  Help needed turning from your back to your side while in a flat bed without using bedrails?: Total Help needed moving from lying on your back to sitting on the side of a flat bed without using bedrails?: Total Help needed moving to and from a bed to a chair (including a wheelchair)?: Total Help needed standing up from a chair using your arms (e.g., wheelchair or bedside chair)?: Total Help needed to walk in hospital room?: Total Help needed climbing 3-5 steps with a railing? : Total 6 Click Score: 6    End of Session Equipment Utilized During Treatment: Oxygen Activity Tolerance: Patient tolerated treatment well Patient left: with call bell/phone within reach;in bed;with bed alarm set Nurse Communication: Mobility status;Need for lift equipment PT Visit Diagnosis: Other abnormalities of gait and mobility (R26.89);Hemiplegia and hemiparesis Hemiplegia - Right/Left: Right Hemiplegia - dominant/non-dominant: Dominant Hemiplegia - caused by: Nontraumatic intracerebral hemorrhage     Time: 3614-4315 PT Time Calculation (min) (ACUTE ONLY): 36 min  Charges:  $Therapeutic Activity: 8-22 mins                     Earney Navy, PTA Acute Rehabilitation  Services Pager: [phone] Office: [phone]     Darliss Cheney 08/24/2019, 11:30 AM

## 2019-08-24 NOTE — Progress Notes (Signed)
Occupational Therapy Treatment Patient Details Name: Lee Hooper MRN: [id number] DOB: 1970-12-25 Today's Date: 08/24/2019    History of present illness This 49 y.o. male admitted 4/28 with Rt sided hemiplegia and aphasia.  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  Repeat CT following procedure showed moderate basal ganglia hemorrhage vs. stroke   Repeat MRI revealed that the ICA and MCA re-occluded with change in FLAIR sequence excluding him from repeat thrombectomy.  He underwent a decompressive hemicraniectomy.  He was intubated 4/28, trach 5/8; liberated from ventilator 5/14.  He developed an ileus 5/2.  5/9 IVC filter placed due to bil LE DVTs (extensive). PEG 5/14. PMH includes: COPD, tobacco abuse, psoriasis   OT comments  Pt seen in conjunction with PT to maximize activity tolerance. Pt limited by fatigue and general malaise this session as pt required total A +2 for bed mobility and MAX A to sit EOB. Pt able to sit EOB with MAX A with pt able to locate family picture at midline briefly before return head to preferred flexed positon. Pt often leaning to L attempting to return self to supine. Pt noted to have muscle spams/ shaking in LLE during transition back to supine; Alerted RN. Continue to recommend CIR for DC, will follow for OT needs.   Follow Up Recommendations  CIR    Equipment Recommendations  Other (comment) (defer to next venue of care)    Recommendations for Other Services      Precautions / Restrictions Precautions Precautions: Fall Precaution Comments: R hemi, trach (has PMSV), PEG, crani-No Bone flap L head; rt shoulder pain/subluxation; bil LE DVTs with IVC filter Required Braces or Orthoses:  (asked nsg to order Prevalon for R foot) Restrictions Weight Bearing Restrictions: No       Mobility Bed Mobility Overal bed mobility: Needs Assistance Bed Mobility: Sit to Sidelying;Supine to Sit     Supine to sit: +2 for physical assistance;HOB  elevated;Total assist Sit to supine: +2 for physical assistance;Total assist   General bed mobility comments: total A +2 for all aspects of bed mobility with multimodal cues for sequencing; pt attempting to use rail with L UE   Transfers                 General transfer comment: unable to tranfser this session secondary to safety    Balance Overall balance assessment: Needs assistance Sitting-balance support: Feet supported;Single extremity supported Sitting balance-Leahy Scale: Poor Sitting balance - Comments: pt able to maintain sitting balance EOB holding onto rail with L UE with min guard assist for safety initially; pt required increased assistance when becoming fatigued and attempting to lie back down in bed Postural control: Right lateral lean                                 ADL either performed or assessed with clinical judgement   ADL Overall ADL's : Needs assistance/impaired     Grooming: Wash/dry face;Bed level;Total assistance Grooming Details (indicate cue type and reason): secondary to fatigue             Lower Body Dressing: Total assistance;Bed level Lower Body Dressing Details (indicate cue type and reason): to don socks   Toilet Transfer Details (indicate cue type and reason): unable to transfer this session         Functional mobility during ADLs: Total assistance;+2 for safety/equipment;+2 for physical assistance (bed mobility  only this session) General ADL Comments: pt with decreased activity tolerance this session noted to attempt to lay back down once sitting EOB. total A +2 for bed mobility this session and MAX A for sitting balance     Vision       Perception     Praxis      Cognition Arousal/Alertness: Awake/alert Behavior During Therapy: Flat affect Overall Cognitive Status: Impaired/Different from baseline Area of Impairment: Attention;Following commands;Problem solving;Awareness;Safety/judgement                    Current Attention Level: Sustained   Following Commands: Follows one step commands with increased time;Follows one step commands inconsistently Safety/Judgement: Decreased awareness of deficits;Decreased awareness of safety Awareness: Intellectual Problem Solving: Slow processing;Requires verbal cues;Requires tactile cues;Decreased initiation;Difficulty sequencing General Comments: pt did not attempt to verbalize during session; answers with head nods yes/no but inconsistent in responses         Exercises     Shoulder Instructions       General Comments pt on trach collar 28% FiO2 5LPM; HR increase to 140s during mobility with all other VSS; noted suction on primo fit up to 125; alerted RN    Pertinent Vitals/ Pain       Pain Assessment: Faces Faces Pain Scale: Hurts even more Pain Location: pt grimacing and holding L thigh and appeared to have muscle spasms; pt nodded yes to bilat LE pain  Pain Descriptors / Indicators: Discomfort;Grimacing;Spasm;Other (Comment) (tremors/shaking of bilat LE) Pain Intervention(s): Limited activity within patient's tolerance;Monitored during session;Repositioned;Other (comment) (RN notified)  Home Living                                          Prior Functioning/Environment              Frequency  Min 2X/week        Progress Toward Goals  OT Goals(current goals can now be found in the care plan section)  Progress towards OT goals: Progressing toward goals  Acute Rehab OT Goals Patient Stated Goal: nods head "yes" to sit at EOB OT Goal Formulation: Patient unable to participate in goal setting Time For Goal Achievement: 09/02/19 (OTR updated goals 6/16) Potential to Achieve Goals: [place] Discharge plan remains appropriate;Frequency remains appropriate    Co-evaluation    PT/OT/SLP Co-Evaluation/Treatment: Yes Reason for Co-Treatment: For patient/therapist safety;To address functional/ADL  transfers;Complexity of the patient's impairments (multi-system involvement);Necessary to address cognition/behavior during functional activity PT goals addressed during session: Mobility/safety with mobility;Balance OT goals addressed during session: ADL's and self-care      AM-PAC OT "6 Clicks" Daily Activity     Outcome Measure   Help from another person eating meals?: Total Help from another person taking care of personal grooming?: A Lot Help from another person toileting, which includes using toliet, bedpan, or urinal?: Total Help from another person bathing (including washing, rinsing, drying)?: Total Help from another person to put on and taking off regular upper body clothing?: Total Help from another person to put on and taking off regular lower body clothing?: Total 6 Click Score: 7    End of Session Equipment Utilized During Treatment: Oxygen;Other (comment) (28% FiO2 5LPM)  OT Visit Diagnosis: Unsteadiness on feet (R26.81);Cognitive communication deficit (R41.841);Hemiplegia and hemiparesis;Muscle weakness (generalized) (M62.81);Apraxia (R48.2);Other symptoms and signs involving cognitive function;Pain Symptoms and signs involving cognitive functions: Cerebral infarction  Hemiplegia - Right/Left: Right Hemiplegia - caused by: Cerebral infarction Pain - Right/Left: Right   Activity Tolerance Patient limited by lethargy;Patient limited by fatigue   Patient Left in bed;with call bell/phone within reach;with bed alarm set   Nurse Communication Mobility status        Time: 7517-0017 OT Time Calculation (min): 35 min  Charges: OT General Charges $OT Visit: 1 Visit OT Treatments $Therapeutic Activity: 8-22 mins  Audery Amel., COTA/L Acute Rehabilitation Services [phone]    Angelina Pih 08/24/2019, 1:12 PM

## 2019-08-24 NOTE — Progress Notes (Signed)
PROGRESS NOTE  Lee Hooper  DOB: [birth year]  PCP: Patient, No Pcp Per KDX:[id number]  DOA: 07/01/2019  LOS: 1 days   Chief Complaint  Patient presents with  . Code Stroke   Brief narrative: Lee Hooper is a 49 y.o. male , chronic daily smoker with COPD and psoriasis who was admitted on 4/28 primarily under neurology service.   Medicine consultation was: 6/19 for comanagement of medical issues.    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  Underwent arteriogram with revascularization/thrombectomy but had rethrombosis and hence underwent L depressive hemicraniectomy.  He remained intubated in ICU postprocedure. Could not be extubated. 5/8 -trach placement.  Also found to have bilateral lower extremity DVT. 5/9 -IVC filter placement.  5/23 -Eliquis resumed for DVT 6/4 - required scalp wound revision on 6/4 and is planned for cranioplasty next week.  6/6 - transfered out of ICU. 6/17 -started on dysphagia diet 6/19 -started having fever up to 103.2, tachycardia.  Chest x-ray with possible aspiration.  Sepsis work-up started and hospitalist service was consulted.  Subjective: Patient was seen and examined this morning.  Middle-aged Caucasian male.   Alert, awake, blinks eyes to acknowledge understanding.  Able to follow command when asked to move left extremities. Dense right hemiparesis.  Chart reviewed.  Patient has remained persistently febrile last 24 hours between 101-103 Tachycardic between 100 and 140 On 5 L oxygen by trach collar Repeat labs this morning showed WBC count improving to 12.5, sodium normal at 138, creatinine normal at 0.65.    Assessment/Plan: Sepsis likely secondary to aspiration pneumonia -Unfortunate young man with catastrophic CVA in April, still recovering with dense R hemiparesis -6-17 - started on Dysphagia 1 diet on 6/17 -6/19 -started having fever, tachycardia. -CXR with possible bibasilar opacification,  not overly convincing -Lactic acid was elevated.  However procalcitonin level is less than 0.1. -Started on sepsis protocol with IV fluid, blood culture, urine culture and initiation of broad-spectrum IV antibiotics. -Patient still remains on IV Cefepime/Vanc/Flagyl for undifferentiated sepsis -Lactic acid normal on repeat check this morning. -Oral feeding held. Continue tube feeding.  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  Underwent arteriogram with revascularization/thrombectomy but had rethrombosis and hence underwent L depressive hemicraniectomy.  -Trach/PEG in place -Planned for cranioplasty soon but may need to delay depending on infection  DVT -5/8 -extensive B LE DVT -5/9  -IVC filter placed  -5/23 -Eliquis initiated  Pressure injury -Stage 1 sacral ulcer, appears unrelated to current sepsis  Psoriasis -No apparent treatment as outpatient  Hyperglycemia - A1c 5.5 on 4/29 -Continue sliding scale with Accu-Cheks  Nutrition Problem: Inadequate oral intake Etiology: inability to eat  Signs/Symptoms: NPO status  Mobility: PT following Code Status:   Code Status: Prior  DVT prophylaxis:  Eliquis Antimicrobials:  Cefepime, metronidazole, vancomycin Fluid: Continue LR at 75.  Diet: PEG tube feeding ongoing.  Oral feeding has been held.  Family Communication:  Called and updated patient's wife yesterday afternoon.  Infusions:  . sodium chloride Stopped (07/21/19 0012)  . ceFEPime (MAXIPIME) IV 2 g (08/24/19 0548)  . lactated ringers 75 mL/hr at 08/23/19 1821  . metronidazole 500 mg (08/24/19 0702)  . vancomycin 1,250 mg (08/24/19 0337)    Scheduled Meds: . apixaban  5 mg Per Tube BID  . chlorhexidine gluconate (MEDLINE KIT)  15 mL Mouth Rinse BID  . Chlorhexidine Gluconate Cloth  6 each Topical Daily  . feeding supplement (OSMOLITE 1.5 CAL)  237 mL Per Tube TID  . feeding supplement (PRO-STAT SUGAR FREE 64)  30 mL Per Tube TID  . free water   150 mL Per Tube Q4H  . insulin aspart  0-9 Units Subcutaneous TID WC  . mouth rinse  15 mL Mouth Rinse q12n4p  . metoprolol tartrate  25 mg Per Tube BID  . multivitamin  15 mL Per Tube Daily  . mupirocin ointment   Nasal BID  . nutrition supplement (JUVEN)  1 packet Per Tube BID BM  . pantoprazole sodium  40 mg Per Tube Daily  . sodium chloride flush  10-40 mL Intracatheter Q12H    Antimicrobials: Anti-infectives (From admission, onward)   Start     Dose/Rate Route Frequency Ordered Stop   08/23/19 0400  vancomycin (VANCOREADY) IVPB 1250 mg/250 mL     Discontinue     1,250 mg 166.7 mL/hr over 90 Minutes Intravenous Every 12 hours 08/22/19 1526     08/22/19 1530  metroNIDAZOLE (FLAGYL) IVPB 500 mg     Discontinue     500 mg 100 mL/hr over 60 Minutes Intravenous Every 8 hours 08/22/19 1511     08/22/19 1530  ceFEPIme (MAXIPIME) 2 g in sodium chloride 0.9 % 100 mL IVPB     Discontinue     2 g 200 mL/hr over 30 Minutes Intravenous Every 8 hours 08/22/19 1525     08/22/19 1530  vancomycin (VANCOREADY) IVPB 1750 mg/350 mL        1,750 mg 175 mL/hr over 120 Minutes Intravenous  Once 08/22/19 1526 08/22/19 1814   08/22/19 1515  ceFEPIme (MAXIPIME) 2 g in sodium chloride 0.9 % 100 mL IVPB  Status:  Discontinued        2 g 200 mL/hr over 30 Minutes Intravenous  Once 08/22/19 1511 08/22/19 1525   08/22/19 1515  vancomycin (VANCOCIN) IVPB 1000 mg/200 mL premix  Status:  Discontinued        1,000 mg 200 mL/hr over 60 Minutes Intravenous  Once 08/22/19 1511 08/22/19 1525   07/28/19 0800  vancomycin (VANCOCIN) IVPB 1000 mg/200 mL premix  Status:  Discontinued        1,000 mg 200 mL/hr over 60 Minutes Intravenous Every 8 hours 07/27/19 2300 07/30/19 1522   07/27/19 2300  ceFEPIme (MAXIPIME) 2 g in sodium chloride 0.9 % 100 mL IVPB  Status:  Discontinued        2 g 200 mL/hr over 30 Minutes Intravenous Every 8 hours 07/27/19 2251 08/02/19 0927   07/27/19 2300  vancomycin (VANCOREADY) IVPB 2000  mg/400 mL        2,000 mg 200 mL/hr over 120 Minutes Intravenous  Once 07/27/19 2258 07/28/19 0130   07/27/19 2245  ceFEPIme (MAXIPIME) 2 g in sodium chloride 0.9 % 100 mL IVPB  Status:  Discontinued        2 g 200 mL/hr over 30 Minutes Intravenous Every 12 hours 07/27/19 2244 07/27/19 2251   07/17/19 1400  ceFAZolin (ANCEF) IVPB 2g/100 mL premix        2 g 200 mL/hr over 30 Minutes Intravenous Every 8 hours 07/17/19 0922 07/20/19 2213   07/16/19 1400  ceFEPIme (MAXIPIME) 2 g in sodium chloride 0.9 % 100 mL IVPB  Status:  Discontinued        2 g 200 mL/hr over 30 Minutes Intravenous Every 8 hours 07/16/19 1132 07/17/19 0922   07/16/19 1200  vancomycin (VANCOREADY) IVPB 1250 mg/250 mL  Status:  Discontinued  1,250 mg 166.7 mL/hr over 90 Minutes Intravenous Every 24 hours 07/15/19 1333 07/17/19 0923   07/15/19 2200  ceFEPIme (MAXIPIME) 2 g in sodium chloride 0.9 % 100 mL IVPB  Status:  Discontinued        2 g 200 mL/hr over 30 Minutes Intravenous Every 12 hours 07/15/19 1253 07/16/19 1132   07/15/19 1200  vancomycin (VANCOREADY) IVPB 1500 mg/300 mL  Status:  Discontinued        1,500 mg 150 mL/hr over 120 Minutes Intravenous Every 24 hours 07/14/19 1037 07/15/19 1334   07/14/19 1400  ceFEPIme (MAXIPIME) 2 g in sodium chloride 0.9 % 100 mL IVPB  Status:  Discontinued        2 g 200 mL/hr over 30 Minutes Intravenous Every 8 hours 07/14/19 1015 07/15/19 1253   07/14/19 1045  vancomycin (VANCOREADY) IVPB 1750 mg/350 mL        1,750 mg 175 mL/hr over 120 Minutes Intravenous  Once 07/14/19 1037 07/14/19 1455   07/08/19 1100  Ampicillin-Sulbactam (UNASYN) 3 g in sodium chloride 0.9 % 100 mL IVPB        3 g 200 mL/hr over 30 Minutes Intravenous Every 6 hours 07/08/19 1057 07/09/19 2319   07/07/19 2200  amoxicillin-clavulanate (AUGMENTIN) 875-125 MG per tablet 1 tablet  Status:  Discontinued        1 tablet Per Tube Every 12 hours 07/07/19 1546 07/08/19 1057   07/05/19 1500   amoxicillin-clavulanate (AUGMENTIN) 875-125 MG per tablet 1 tablet  Status:  Discontinued        1 tablet Per Tube Every 12 hours 07/05/19 1214 07/07/19 1546   07/03/19 1000  ceFEPIme (MAXIPIME) 2 g in sodium chloride 0.9 % 100 mL IVPB  Status:  Discontinued        2 g 200 mL/hr over 30 Minutes Intravenous Every 8 hours 07/03/19 0926 07/05/19 1214   07/01/19 0923  ceFAZolin (ANCEF) 2-4 GM/100ML-% IVPB       Note to Pharmacy: Domenick Bookbinder   : cabinet override      07/01/19 0923 07/01/19 2129      PRN meds: sodium chloride, acetaminophen, fentaNYL (SUBLIMAZE) injection, iohexol, ipratropium-albuterol, metoprolol tartrate, ondansetron **OR** ondansetron (ZOFRAN) IV, promethazine, sodium chloride flush   Objective: Vitals:   08/24/19 1133 08/24/19 1137  BP: 111/72   Pulse: (!) 119 (!) 121  Resp: 20 (!) 28  Temp: (!) 102.5 F (39.2 C)   SpO2: 94% 92%    Intake/Output Summary (Last 24 hours) at 08/24/2019 1245 Last data filed at 08/24/2019 0300 Gross per 24 hour  Intake --  Output 3000 ml  Net -3000 ml   Filed Weights   08/22/19 0427 08/23/19 0500 08/24/19 0500  Weight: 92.2 kg 97 kg 96.5 kg   Weight change: -0.5 kg Body mass index is 28.85 kg/m.   Physical Exam: General exam: Middle-aged Caucasian male.  Eyes open.  Nods head to acknowledge my presence.  Not in physical distress Skin: No rashes, lesions or ulcers. HEENT: Left hemicraniectomy status.  Bulging on the left side of the head with herniation of intracranial tissues.   Lungs: Clear to auscultation bilaterally CVS: Tachycardic, no murmur GI/Abd soft, nontender, nondistended, bowel sound present CNS: Alert, awake, followd verbal command to move the left side. Right dense hemiplegia. Psychiatry: Depressed look Extremities: No pedal edema, no calf tenderness  Data Review: I have personally reviewed the laboratory data and studies available.  Recent Labs  Lab 08/19/19 [phone] 08/22/19 1255 08/22/19 1915  08/24/19 0440  WBC 6.3 13.4* 16.0* 12.5*  NEUTROABS  --   --   --  9.3*  HGB 14.3 14.2 15.7 12.0*  HCT 45.0 43.9 49.2 38.5*  MCV 94.9 93.8 96.1 97.7  PLT 191 225 242 164   Recent Labs  Lab 08/19/19 0652 08/22/19 1255 08/22/19 1915 08/24/19 0440  NA 137 134* 131* 138  K 4.2 3.8 4.3 3.6  CL 102 100 97* 107  CO2 _0 GLUCOSE 101* 145* 150* 106*  BUN 20 23* 21* 13  CREATININE 0.60* 0.84 0.87 0.65  CALCIUM 9.6 9.3 9.2 8.7*  MG  --   --   --  1.9  PHOS  --   --   --  2.6    Signed, Terrilee Croak, MD [facility] Pager: [phone] (Secure Chat preferred). 08/24/2019

## 2019-08-24 NOTE — Progress Notes (Signed)
Pharmacy Antibiotic Note  Lee Hooper is a 49 y.o. male admitted on 07/01/2019 with CVA. Pharmacy has been consulted for vancomycin + cefepime dosing with concerns for developing sepsis. Scr stable.   Vancomycin trough level drawn appropriately today = 9.  Goal trough level 15-20 for sepsis  Plan: -Continue Cefepime 2g IV q8h -Increase vancomycin to 1500 mg q 12 hrs. -Follow Cr, LOT, cultures -Vancomycin trough again as needed   Height: 6' (182.9 cm) Weight: 96.5 kg (212 lb 11.9 oz) IBW/kg (Calculated) : 77.6  Temp (24hrs), Avg:100.5 F (38.1 C), Min:99.3 F (37.4 C), Max:102.5 F (39.2 C)  Recent Labs  Lab 08/19/19 0652 08/22/19 1255 08/22/19 1537 08/22/19 1915 08/22/19 2330 08/24/19 0440 08/24/19 1511  WBC 6.3 13.4*  --  16.0*  --  12.5*  --   CREATININE 0.60* 0.84  --  0.87  --  0.65  --   LATICACIDVEN  --   --  2.2* 3.3* 1.7  --   --   VANCOTROUGH  --   --   --   --   --   --  9*    Estimated Creatinine Clearance: 133.1 mL/min (by C-G formula based on SCr of 0.65 mg/dL).    Allergies  Allergen Reactions  . Tegaderm Ag Mesh [Silver] Other (See Comments)    blisters    Antimicrobials this admission: Cefepime 4/30 >>5/2; 6/19 >>    Augmentin 5/2>>5/5 Unasyn 5/5 >> 5/6 Cefepime 5/11 >> 5/14, 5/24>5/30 Vancomycin 5/11 >> 5/14, 5/24>5/27; 6/19 >> Cefazolin 5/14 >> 5/17 Cefazolin 2 gm x 1 pre-op on 6/2  Microbiology results: 4/28 MRSA neg 4/30 TA: m cat, h. Influenzae - beta lactamase + 5/2 UCx - negative 5/2 BCx - negative 5/7 Bcx - negative 5/7 TA - H.influenza + Moraxella 5/11 BCx - negative 5/11TA - E.coli (S Ancef, Cipro, Zosyn) 5/25 BCx: negative 6/2 Wound culture: normal flora  Thank you for allowing pharmacy to be a part of this patient's care.   Reece Leader, Colon Flattery, BCCP Clinical Pharmacist  08/24/2019 4:15 PM   [facility] pharmacy phone numbers are listed on [email or web address]

## 2019-08-24 NOTE — Progress Notes (Signed)
STROKE PROGRESS NOTE INTERVAL HISTORY  Unfortunately, patient continues to be febrile 102.5.  He is Maxipime and Flagyl but no clear source of infection.  Hospitalist is helping with management.  No neurological changes.  Vital signs stable. Vitals:   08/24/19 1133 08/24/19 1137 08/24/19 1515 08/24/19 1538  BP: 111/72  109/71 105/72  Pulse: (!) 119 (!) 121    Resp: 20 (!) 28 (!) 21 20  Temp: (!) 102.5 F (39.2 C)   99.3 F (37.4 C)  TempSrc: Oral   Oral  SpO2: 94% 92% 94%   Weight:      Height:       CBC:  Recent Labs  Lab 08/22/19 1915 08/24/19 0440  WBC 16.0* 12.5*  NEUTROABS  --  9.3*  HGB 15.7 12.0*  HCT 49.2 38.5*  MCV 96.1 97.7  PLT 242 025   Basic Metabolic Panel:  Recent Labs  Lab 08/22/19 1915 08/24/19 0440  NA 131* 138  K 4.3 3.6  CL 97* 107  CO2 24 22  GLUCOSE 150* 106*  BUN 21* 13  CREATININE 0.87 0.65  CALCIUM 9.2 8.7*  MG  --  1.9  PHOS  --  2.6    IMAGING past 24 hours No results found.  PHYSICAL EXAM  General- middle-aged Caucasian male, s/p tracheostomy on trach collar, sleeping but arousable.   Ophthalmologic- fundi not visualized due to noncooperation.  Cardiovascular - Regular rate and rhythm.  Neuro - s/p tracheostomy on trach collar.  Eyes open but the patient is mute.  Currently febrile 103 and shivering.  Global aphasia, left gaze preference position, barely cross midline, notblinking to visual threat on the left, PERRL. Right facial droop. Tongue protrusion not cooperative. LUE spontaneous movement at least 4/5, LLE against gravity at least 3/5, right UE and LE flaccid still and does not seem to be developing rigidity or spasticity at this time, slight 1/5 movement with pain at RLE no withdrawal to pain in upper. Sensation, coordination and gait not tested. Not cooperative this morning.    ASSESSMENT/PLAN Mr. MOLLY MASELLI is a 49 y.o. male with history of COPD, ongoing tobacco use, and psoriasis, presenting with dense  LMCA syndrome. Attempt for Left ICA and Left M1 thrombectomy was done emergently with TICI 2b flow. Unfortunately, the pt had post procedure hemorrhage and reocclusion of the vessels. F/u imaging suggested early edema and pt underwent prophylactic decompressive hemicraniectomy.  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 Stroke etiology unknown - given DVT requiring eventual AC and high mRS, will not do further embolic workup  Eliquis for VTE prophylaxis  No antithrombotic prior to admission, now on Eliquis.  Therapy recommendations:  CIR   Disposition:  Pending  Left depressive hemicraniectomy on 07/01/19 Dr Christella Noa  Dr. Christella Noa  for cranioplasty, earliest next week.   Respiratory failure s/p Trach Trach wound  Downsized to cuffless trach 6 by CCM 5/24  Fever Tachycardia  Leukocytosis (believed aspiration pneumonia ->sepsis)  Temp - 103.2->102.9->102.2->100  Tachycardia - 127->133->130->86-> (metoprolol given by nursing per standing order) ->112  Known DVT - possible PE? - Chest CTA considered but pt already on Eliquis  IVC filter placed 07/12/19   Eliquis restarted 07/26/19 (5 mg  Bid)  ABGs 08/22/19 - WNL except PO2 - 69.3  (CMP - unremarkable)  ;  (CBC - WBCs 13.4->16.0)  ;  (UA - cloudy with rare bacteria)  CXR - 6/19 - Minimal bibasilar opacification which may be due to atelectasis or infection.  Blood cultures -  No growth < 24 hrs  Lactic Acid - 2.2->3.3->1.7  Procalcitonin < 0.10  Respiratory cultures - pending  Dr Pola Corn consulting  Maxipime, Metronidazole and Vancomycin started 08/22/19 for Sepsis   Labs in AM   Appreciate Hospitalists' assistance  Hyperglycemia  On  TF  Added SSI  Bilateral Lower Ext DVT, extensive Possible PE given tachycardia  Venous US 07/11/2019 DVT + bilateral extensive  IVC filter placed 5/9  On eliquis now for DVT treatment and prophylaxis  Dysphagia   PEG placement 5/14  FW 150 Q4h   On bolus TF -> cut in half to facilitate po intake   MBSS cleared for D1 nectar thick liquids  SLP following for tolerance  Tobacco abuse  Current smoker  Smoking cessation counseling will be provided if able  Other Stroke Risk Factors  Obesity, Body mass index is 28.85 kg/m., recommend weight loss, diet and exercise as appropriate    Likely undiagnosed obstructive sleep apnea based on body habitus  Other Active Problems  Na - 131   Hospital day # 54  Continue antibiotics as per medical hospitalist team recommendations for his continuing fever.  May need ID consult if his fever  does not improve.  I did discuss with patient's wife goals of care again and she want`s full support. Plan check Ct Head..  Patient is difficult to place in skilled nursing facility as he has no insurance.  Greater than 50% time during this 25-minute visit was spent on counseling and coordination of care and discussion with care team.  Discussed with Dr. Sela Hua, MD   To contact Stroke Continuity provider, please refer to [email or web address]. After hours, contact General Neurology

## 2019-08-24 NOTE — Progress Notes (Signed)
Nutrition Follow-up  DOCUMENTATION CODES:   Obesity unspecified  INTERVENTION:  Continuebolus feeding via PEG: -2 cans Osmolite 1.5 cal ( ) TID  -23ml Pro-stat TID - free water Q4H  Tube feeding regimen will provide 2430 kcals, 134 grams of protein, free water ( total free water with flushes)   Continue1 packet Juven BIDvia PEG, each packet provides 95 calories, 2.5 grams of protein (collagen), and 9.8 grams of carbohydrate (3 grams sugar); also contains 7 grams of L-arginine and L-glutamine, 300 mg vitamin C, 15 mg vitamin E, 1.2 mcg vitamin B-12, 9.5 mg zinc, 200 mg calcium, and 1.5 g Calcium Beta-hydroxy-Beta-methylbutyrate to support wound healing   NUTRITION DIAGNOSIS:   Inadequate oral intake related to inability to eat as evidenced by NPO status.  Ongoing.  GOAL:   Patient will meet greater than or equal to 90% of their needs  Addressing with TF.   MONITOR:   TF tolerance  REASON FOR ASSESSMENT:   Consult, Ventilator Enteral/tube feeding initiation and management  ASSESSMENT:   Pt with PMH of smoking and COPD admitted with L ICA/MCA s/p IR for revascularization however pt had re-occlusion now s/p emergent L hemicraniectomy.  4/30 cortrak placed; tip gastric 5/2 TF held due to ileus 5/6 trickle TF started 5/8 trach placed; IVC filter placed 5/14 PEG placed 6/2 s/p scalp wound revision 6/17 pt started on dysphagia 1 diet, TF reduced by MD 6/19 pt febrile, CXR with possible aspiration, sepsis workup started, pt NPO again  Pt sleeping at time of RD visit.   Pt to have cranioplasty.   MD decreased bolus feeding rate when pt was able to consume Dysphagia 1 diet. Current TF via PEG: Osmolite 1.5 cal TID, 60ml Pro-stat TID, free water Q4H, 1 packet Juven BID Now that pt is NPO again, RD will return pt to previous tube feeding regimen.   PO Intake: 10-40% x3 recorded meals   Labs reviewed. CBGs  [phone] Medications: MVI, Novolog  UOP: x24 hours I/O: -27,020.9ml since admit  Diet Order:   Diet Order            Diet NPO time specified  Diet effective now                 EDUCATION NEEDS:   No education needs have been identified at this time  Skin:  Skin Assessment: Skin Integrity Issues: Skin Integrity Issues:: Stage III, Incisions Stage III: R neck Incisions: head x2, R neck  Last BM:  6/20  Height:   Ht Readings from Last 1 Encounters:  07/22/19 6' (1.829 m)    Weight:   Wt Readings from Last 1 Encounters:  08/24/19 96.5 kg    Ideal Body Weight:  80.9 kg  BMI:  Body mass index is 28.85 kg/m.  Estimated Nutritional Needs:   Kcal:  2300-2500  Protein:  125-145 grams  Fluid:  2 L/day    Eugene Gavia, MS, RD, LDN RD pager number and weekend/on-call pager number located in Amion.

## 2019-08-24 NOTE — Progress Notes (Signed)
NAME:  Lee Hooper, MRN:  [id number], DOB:  [birth year], LOS: 54 ADMISSION DATE:  07/01/2019, CONSULTATION DATE:  07/01/19 REFERRING MD:  Aroor MD, CHIEF COMPLAINT:  CVA    Brief History   Lee Hooper is a 49 yo M who presented as a code stroke after being found aphasic and nonverbal by his wife. CT/CTA studies showed left ICA/MCA thrombosis and pt was taken for IR thrombectomy for left ICA and left M1 occlusions. Repeat head CT was obtained following the procedure out of concern for possible hemorrhage and showed moderate basal ganglia hemorrhage vs stroke. Repeat MRI revealed that the ICA and MCA re-occluded with change in FLAIR sequence excluding him from repeat thrombectomy. He underwent a L hemicraniectomy. He was intubated for airway protection.  Past Medical History  Psoriasis  COPD  Tobacco Use  Significant Hospital Events   4/28: Admit; L ICA/MCA Thrombectomy with re-occlusion --> left hemicraniectomy, intubated for acute hypoxic respiratory failure  5/3: NAE. Was taken off precedex yesterday and pt opened his eyes. Prednisone 50 mg qd was begun. He had a persistent fever around 38.2, HR 110s, BP 140s/60s. Pt required increased O2 with secretions; CXR showed mild edema with small effusions c/f aspiration vs opacification. Blood culture x2 and urine culture collected, NGTD. ABG pending. Currently satting 92% on PRVC FiO2 80, PEEP 10, Plateau 22. WBC declined to 10.5 from 11.1, Na 151 from 156. Tube feeds were held d/t distended colonic loop in RLQ on KUB, to be continued today. Thrombocytopenia improved to 93 from 81.  5/4: Patient remains intubated with spontaneous movement of left arm and leg.  5/5: NAE. Pt remains intubated on PRN fetanyl. Will open eyes and spontaneously move left leg and arm.  5/6: NAE overnight. Intubated with no sedation. 5/7: NAE. Marland Kitchen Completed Unasyn yesterday. VSS, spO2 remains in low 90s the majority of the time on 620/40%/+5. Morning labs significant  for HCO3 of 19. Otherwise unremarkable.  5/8: Ongoing fever.  Obtained ultrasound, positive for lower extremity DVT bilaterally.  Tracheostomy placed at bedside.,  Did have some SVT this responded to treating fever and rate control. 5/9: IVC filter placed 5/11: no acute events overnight. Pt with similar exam as previous however tachycardic and febrile (tmax102.7) with a bit increased wbc and new R infiltrate noted in base. Pan cx and start empiric abx. 5/12 Tolerated 8 hours of trach collar  5/17 completed antibiotics, added IV Lasix, for ongoing high supplemental oxygen requirements.  Because of this we delayed transfer out 5/18 improved oxygenation, improved x-ray.  Weaning oxygen.  Transitioning to aerosol trach collar 5/18 to 5/24: making slow progress on I&O balance but oxygen requirements much improved. DOAC started 5/23 w/ no new issues. Will ask RT to change to cuffless.  Consults:  IR PCCM  Neuro (Primary) Neurosurgery  Procedures:  4/28 IR - L ICA/MCA Thrombectomy 4/28 L hemicraniectomy 4/28 Intubation 5/08 Trach   Significant Diagnostic Tests:  4/28 Admit CT Head Code Stroke:  Hyperdense left ICA and MCA compatible with acute thrombus. No acute infarct or hemorrhage.  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 The patient has minimal atherosclerotic disease. The left internal carotid artery is occluded through the terminus and extending in the left M1 M2 and M3 branches. There is poor collateral circulation on the left. There is a large territory infarct involving the left hemisphere. Infarct volume 266 mL.  4/28 Admit  MR Brain:  Restricted diffusion throughout much of the left MCA vascular territory consistent with acute ischemia. Restricted diffusion consistent with acute ischemia is also present within the paramedian left frontoparietal lobes, although this is less well assessed  due to the degree of motion degradation at the level of the vertex. There is little if any corresponding T2/FLAIR hyperintensity at these sites. No significant mass effect. No midline shift.  4/28 Post Thrombectomy CT Head: : 1. 3.0 x 2.6 x 3.9 cm region of hyperdensity centered within the left basal ganglia, left subinsular region and inferomedial left temporal lobe likely reflecting a combination of parenchymal hematoma and contrast staining.  2. Edema with loss of gray-white differentiation within the left basal ganglia, left insula and anterior left temporal lobe, likely acute infarction. Subtle changes of acute infarction are also suspected within the paramedian left frontal lobe ACA vascular territory. 3. Scattered small-volume subarachnoid hemorrhage along the left cerebral hemisphere. 4. Regional mass effect with effacement of the left lateral ventricle temporal horn. No midline shift.  4/28 Post Thrombectomy MR Brain:  1. Restricted diffusion throughout the majority of the left MCA and ACA vascular territories consistent with acute ischemia. 2. No significant mass effect at this time. No midline shift. 3. The acute parenchymal hemorrhage and/or contrast staining centered within left basal ganglia and inferomedial left temporal lobe on prior head CT does not appear significantly changed. Based on the MR appearance, it is suspected that a significant component of this previously demonstrated hyperdensity reflects contrast staining. 4. Redemonstrated small volume subarachnoid hemorrhage overlying the left cerebral hemisphere. Small volume subarachnoid hemorrhage is also questioned along the right cerebral hemisphere posteriorly.  4/29 Post Hemicraniectomy CT head:  Complete left ACA/MCA territory infarction with swollen brain bulging through craniectomy defect. No midline shift or entrapment. Petechial hemorrhage at left basal ganglia. Extraaxial hemorrhage along surface of the infarct.    5/1 Head CT > Slightly increased rightward midline shift with a small amount of herniation beneath the anterior falx. Otherwise unchanged Examination.  5/6 Head CT: Left ACA and MCA territory infarct with unchanged degree of subarachnoid hemorrhage. Swelling has progressed with greater herniation through the craniectomy defect and 5 mm of midline shift.  5/8: Bilateral acute DVT involving the right common femoral vein right proximal profundal vein and right popliteal vein also left DVT involving same vessels  5/12 Renal US: Technically challenging exam due to patient's intubated status and difficulty with repositioning.  Diffusely increased cortical echogenicity in both kidneys compatible with medical renal disease.  Increased echogenicity of the liver with diminished through transmission, most often reflective of hepatic steatosis.  5/20 Ct H> significant motion artifact. Continued low density and swelling of L hemisphere in L ACA and MCA idstributions. Areas of petechial hemorrhage, no large hematoma. L craniectomy w bulging of brain through defect. Reduced mass effect with L to R shift now 36mm from 61mm previously   Micro Data:  4/28: SARS-CoV-2/Influenza A/Influenza B PCR: Negative  4/30: Respiratory culture: Abundant Moraxella Catarrhalis, Haemophilus Influenzae, beta lactamase positive  5/2: Urine culture: NGTD  5/2: BC x2: NGTD 5/7 resp >> H flu 5/7 bld >> negative 5/11 BCx2 >> negative 5/11 trach asp >> E coli  Antimicrobials:  4/30 - 5/2: Cefepime 5/2 - 5/4: Augmentin  5/5 - 5/6 unasyn   Vanc 5/11 >> 5/13, 6/19>> Cefepime 5/11 >> 5/13, 6/19>> Ancef 5/14 >> 5/17 Metronidazole 6/19>>  Interim history/subjective:   Fevers the last 3 days-has been on metronidazole vancomycin and cefepime  Objective   Blood pressure 111/72, pulse (!) 121, temperature (!) 102.5 F (39.2 C), temperature source Oral, resp. rate (!) 28, height 6' (1.829 m), weight 96.5 kg, SpO2 92 %.     FiO2 (%):  [21 %] 21 %   Intake/Output Summary (Last 24 hours) at 08/24/2019 1240 Last data filed at 08/24/2019 0300 Gross per 24 hour  Intake --  Output 3000 ml  Net -3000 ml   Filed Weights   08/22/19 0427 08/23/19 0500 08/24/19 0500  Weight: 92.2 kg 97 kg 96.5 kg    Examination: General: Middle-aged male , lying in bed, does appear comfortable HEENT: 6 cuffless trach in place, small ulceration starting to healed just below the insertion site and anterior cusp of the ostomy Neuro: Nonverbal, more so on the left side CV: S1-S2 appreciated PULM: clear GI: Soft, nondistended Extremities: no edema Skin: pressure dressing in place sacrum   Resolved Hospital Problem list   treated HCAP with Moraxella and Haemophilus influenza, these were beta-lactamase positive, antibiotics were completed 5/6; COPD exacerbation. Ileus, AKI, hypernatremia Completed E. coli HCAP treatment on 5/18  Assessment & Plan:   Acute hypoxic respiratory failure with compromised airway. Failure to wean s/p tracheostomy. Hx of smoking with presumed COPD. Large Lt ICA/MCA CVA s/p thrombectomy and decompressive hemicraniectomy 2nd to cerebral edema and hemorrhage. Mild hypernatremia-->improved w/ Free water replacement  B/l lower extremity DVT.- s/p IVC filter 5/09, now on [facility] SVT.  New fever -Blood culture 6/19-no growth -Respiratory culture with few gram-negative rods pending identification -Continue current antibiotics and follow cultures -Chest x-ray on 619 is unrevealing  Add ibuprofen for fever  Plan Continue trach collar ad lib, cuffless #6 Suction prn. Still requiring 4x a Hooper - not clear that he will achieve a position to consider decannulation. Will need better secretion management, continued work with SLP for PMV, hopefully one Hooper capping although not in current state.   Plan for cranioplasty  Best practice:  Diet: EN  DVT prophylaxis:  IVC filter 5/9, anticoag w Eliquis GI prophylaxis:  Protonix Mobility: PT/ OT Code Status: FULL Disposition: neuro floor > ? To rehab or SNF  Virl Diamond, MD [place] [facility] Pager: [phone]

## 2019-08-25 ENCOUNTER — Inpatient Hospital Stay (HOSPITAL_COMMUNITY): Payer: Medicaid Other

## 2019-08-25 DIAGNOSIS — J0410 Acute tracheitis without obstruction: Secondary | ICD-10-CM

## 2019-08-25 DIAGNOSIS — R509 Fever, unspecified: Secondary | ICD-10-CM

## 2019-08-25 DIAGNOSIS — R6520 Severe sepsis without septic shock: Secondary | ICD-10-CM

## 2019-08-25 DIAGNOSIS — A413 Sepsis due to Hemophilus influenzae: Secondary | ICD-10-CM

## 2019-08-25 LAB — GLUCOSE, CAPILLARY
Glucose-Capillary: 87 mg/dL (ref 70–99)
Glucose-Capillary: 148 mg/dL — ABNORMAL HIGH (ref 70–99)
Glucose-Capillary: 180 mg/dL — ABNORMAL HIGH (ref 70–99)
Glucose-Capillary: 102 mg/dL — ABNORMAL HIGH (ref 70–99)

## 2019-08-25 LAB — CBC WITH DIFFERENTIAL/PLATELET
Abs Immature Granulocytes: 0.05 10*3/uL (ref 0.00–0.07)
Basophils Absolute: 0 10*3/uL (ref 0.0–0.1)
Basophils Relative: 1 %
Eosinophils Absolute: 0.1 10*3/uL (ref 0.0–0.5)
Eosinophils Relative: 2 %
HCT: 38 % — ABNORMAL LOW (ref 39.0–52.0)
Hemoglobin: 11.9 g/dL — ABNORMAL LOW (ref 13.0–17.0)
Immature Granulocytes: 1 %
Lymphocytes Relative: 16 %
Lymphs Abs: 1.4 10*3/uL (ref 0.7–4.0)
MCH: 30.5 pg (ref 26.0–34.0)
MCHC: 31.3 g/dL (ref 30.0–36.0)
MCV: 97.4 fL (ref 80.0–100.0)
Monocytes Absolute: 0.9 10*3/uL (ref 0.1–1.0)
Monocytes Relative: 10 %
Neutro Abs: 6.1 10*3/uL (ref 1.7–7.7)
Neutrophils Relative %: 70 %
Platelets: 205 10*3/uL (ref 150–400)
RBC: 3.9 MIL/uL — ABNORMAL LOW (ref 4.22–5.81)
RDW: 15.9 % — ABNORMAL HIGH (ref 11.5–15.5)
WBC: 8.7 10*3/uL (ref 4.0–10.5)
nRBC: 0 % (ref 0.0–0.2)

## 2019-08-25 LAB — BASIC METABOLIC PANEL
Anion gap: 5 (ref 5–15)
BUN: 14 mg/dL (ref 6–20)
CO2: 28 mmol/L (ref 22–32)
Calcium: 8.8 mg/dL — ABNORMAL LOW (ref 8.9–10.3)
Chloride: 108 mmol/L (ref 98–111)
Creatinine, Ser: 0.72 mg/dL (ref 0.61–1.24)
GFR calc Af Amer: >60 mL/min (ref >60)
GFR calc non Af Amer: >60 mL/min (ref >60)
Glucose, Bld: 93 mg/dL (ref 70–99)
Potassium: 3.4 mmol/L — ABNORMAL LOW (ref 3.5–5.1)
Sodium: 141 mmol/L (ref 135–145)

## 2019-08-25 MED ORDER — METOPROLOL TARTRATE 25 MG/10 ML ORAL SUSPENSION
37.5000 mg | Freq: Two times a day (BID) | ORAL | Status: DC
  Administered 2019-08-25 – 2019-08-28 (×6): 37.5 mg
  Filled 2019-08-25 (×7): qty 15

## 2019-08-25 MED ORDER — POTASSIUM CHLORIDE 20 MEQ PO PACK
40.0000 meq | PACK | Freq: Every day | ORAL | Status: AC
  Administered 2019-08-25 – 2019-08-26 (×2): 40 meq
  Filled 2019-08-25 (×2): qty 2

## 2019-08-25 MED ORDER — POTASSIUM CHLORIDE 20 MEQ/15ML (10%) PO SOLN: 40.0000 meq | Freq: Every day | ORAL | Status: DC

## 2019-08-25 MED ORDER — POTASSIUM CHLORIDE 20 MEQ PO PACK
40.0000 meq | PACK | Freq: Once | ORAL | Status: AC
  Administered 2019-08-25: 40 meq
  Filled 2019-08-25: qty 2

## 2019-08-25 NOTE — Progress Notes (Signed)
NAME:  Lee Hooper, MRN:  [id number], DOB:  [birth year], LOS: 55 ADMISSION DATE:  07/01/2019, CONSULTATION DATE:  07/01/19 REFERRING MD:  Aroor MD, CHIEF COMPLAINT:  CVA    Brief History   Lee Hooper is a 49 yo M who presented as a code stroke after being found aphasic and nonverbal by his wife. CT/CTA studies showed left ICA/MCA thrombosis and pt was taken for IR thrombectomy for left ICA and left M1 occlusions. Repeat head CT was obtained following the procedure out of concern for possible hemorrhage and showed moderate basal ganglia hemorrhage vs stroke. Repeat MRI revealed that the ICA and MCA re-occluded with change in FLAIR sequence excluding him from repeat thrombectomy. He underwent a L hemicraniectomy. He was intubated for airway protection.  Past Medical History  Psoriasis  COPD  Tobacco Use  Significant Hospital Events   4/28: Admit; L ICA/MCA Thrombectomy with re-occlusion --> left hemicraniectomy, intubated for acute hypoxic respiratory failure  5/3: NAE. Was taken off precedex yesterday and pt opened his eyes. Prednisone 50 mg qd was begun. He had a persistent fever around 38.2, HR 110s, BP 140s/60s. Pt required increased O2 with secretions; CXR showed mild edema with small effusions c/f aspiration vs opacification. Blood culture x2 and urine culture collected, NGTD. ABG pending. Currently satting 92% on PRVC FiO2 80, PEEP 10, Plateau 22. WBC declined to 10.5 from 11.1, Na 151 from 156. Tube feeds were held d/t distended colonic loop in RLQ on KUB, to be continued today. Thrombocytopenia improved to 93 from 81.  5/4: Patient remains intubated with spontaneous movement of left arm and leg.  5/5: NAE. Pt remains intubated on PRN fetanyl. Will open eyes and spontaneously move left leg and arm.  5/6: NAE overnight. Intubated with no sedation. 5/7: NAE. Marland Kitchen Completed Unasyn yesterday. VSS, spO2 remains in low 90s the majority of the time on 620/40%/+5. Morning labs significant  for HCO3 of 19. Otherwise unremarkable.  5/8: Ongoing fever.  Obtained ultrasound, positive for lower extremity DVT bilaterally.  Tracheostomy placed at bedside.,  Did have some SVT this responded to treating fever and rate control. 5/9: IVC filter placed 5/11: no acute events overnight. Pt with similar exam as previous however tachycardic and febrile (tmax102.7) with a bit increased wbc and new R infiltrate noted in base. Pan cx and start empiric abx. 5/12 Tolerated 8 hours of trach collar  5/17 completed antibiotics, added IV Lasix, for ongoing high supplemental oxygen requirements.  Because of this we delayed transfer out 5/18 improved oxygenation, improved x-ray.  Weaning oxygen.  Transitioning to aerosol trach collar 5/18 to 5/24: making slow progress on I&O balance but oxygen requirements much improved. DOAC started 5/23 w/ no new issues. Will ask RT to change to cuffless.  Consults:  IR PCCM  Neuro (Primary) Neurosurgery  Procedures:  4/28 IR - L ICA/MCA Thrombectomy 4/28 L hemicraniectomy 4/28 Intubation 5/08 Trach   Significant Diagnostic Tests:  4/28 Admit CT Head Code Stroke:  Hyperdense left ICA and MCA compatible with acute thrombus. No acute infarct or hemorrhage.  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 The patient has minimal atherosclerotic disease. The left internal carotid artery is occluded through the terminus and extending in the left M1 M2 and M3 branches. There is poor collateral circulation on the left. There is a large territory infarct involving the left hemisphere. Infarct volume 266 mL.  4/28 Admit  MR Brain:  Restricted diffusion throughout much of the left MCA vascular territory consistent with acute ischemia. Restricted diffusion consistent with acute ischemia is also present within the paramedian left frontoparietal lobes, although this is less well assessed  due to the degree of motion degradation at the level of the vertex. There is little if any corresponding T2/FLAIR hyperintensity at these sites. No significant mass effect. No midline shift.  4/28 Post Thrombectomy CT Head: : 1. 3.0 x 2.6 x 3.9 cm region of hyperdensity centered within the left basal ganglia, left subinsular region and inferomedial left temporal lobe likely reflecting a combination of parenchymal hematoma and contrast staining.  2. Edema with loss of gray-white differentiation within the left basal ganglia, left insula and anterior left temporal lobe, likely acute infarction. Subtle changes of acute infarction are also suspected within the paramedian left frontal lobe ACA vascular territory. 3. Scattered small-volume subarachnoid hemorrhage along the left cerebral hemisphere. 4. Regional mass effect with effacement of the left lateral ventricle temporal horn. No midline shift.  4/28 Post Thrombectomy MR Brain:  1. Restricted diffusion throughout the majority of the left MCA and ACA vascular territories consistent with acute ischemia. 2. No significant mass effect at this time. No midline shift. 3. The acute parenchymal hemorrhage and/or contrast staining centered within left basal ganglia and inferomedial left temporal lobe on prior head CT does not appear significantly changed. Based on the MR appearance, it is suspected that a significant component of this previously demonstrated hyperdensity reflects contrast staining. 4. Redemonstrated small volume subarachnoid hemorrhage overlying the left cerebral hemisphere. Small volume subarachnoid hemorrhage is also questioned along the right cerebral hemisphere posteriorly.  4/29 Post Hemicraniectomy CT head:  Complete left ACA/MCA territory infarction with swollen brain bulging through craniectomy defect. No midline shift or entrapment. Petechial hemorrhage at left basal ganglia. Extraaxial hemorrhage along surface of the infarct.    5/1 Head CT > Slightly increased rightward midline shift with a small amount of herniation beneath the anterior falx. Otherwise unchanged Examination.  5/6 Head CT: Left ACA and MCA territory infarct with unchanged degree of subarachnoid hemorrhage. Swelling has progressed with greater herniation through the craniectomy defect and 5 mm of midline shift.  5/8: Bilateral acute DVT involving the right common femoral vein right proximal profundal vein and right popliteal vein also left DVT involving same vessels  5/12 Renal US: Technically challenging exam due to patient's intubated status and difficulty with repositioning.  Diffusely increased cortical echogenicity in both kidneys compatible with medical renal disease.  Increased echogenicity of the liver with diminished through transmission, most often reflective of hepatic steatosis.  5/20 Ct H> significant motion artifact. Continued low density and swelling of L hemisphere in L ACA and MCA idstributions. Areas of petechial hemorrhage, no large hematoma. L craniectomy w bulging of brain through defect. Reduced mass effect with L to R shift now 63mm from 9mm previously   Micro Data:  4/28: SARS-CoV-2/Influenza A/Influenza B PCR: Negative  4/30: Respiratory culture: Abundant Moraxella Catarrhalis, Haemophilus Influenzae, beta lactamase positive  5/2: Urine culture: NGTD  5/2: BC x2: NGTD 5/7 resp >> H flu 5/7 bld >> negative 5/11 BCx2 >> negative 5/11 trach asp >> E coli 6/19 trach asp >> H. Influ, staph a.   Antimicrobials:  4/30 - 5/2: Cefepime 5/2 - 5/4: Augmentin  5/5 - 5/6 unasyn   Vanc 5/11 >> 5/13, 6/19>> Cefepime 5/11 >> 5/13, 6/19>> Ancef 5/14 >> 5/17 Metronidazole 6/19>>  Interim history/subjective:   Fevers ongoing, tachy.  Lots of secretions   Objective   Blood pressure 131/79, pulse (!) 136, temperature (!) 101.9 F (38.8 C), temperature source Oral, resp. rate (!) 30, height 6' (1.829 m), weight 96.3 kg, SpO2 91  %.    FiO2 (%):  [21 %] 21 %   Intake/Output Summary (Last 24 hours) at 08/25/2019 1122 Last data filed at 08/24/2019 2211 Gross per 24 hour  Intake 6229.13 ml  Output 700 ml  Net 5529.13 ml   Filed Weights   08/23/19 0500 08/24/19 0500 08/25/19 0443  Weight: 97 kg 96.5 kg 96.3 kg    Examination: General: middle aged male, debilitated  HEENT: 6 cuffless, lots of thick secretions  Neuro: nonverbal, follows some basic commands  CV: tachy, regular, s1 s2 PULM: anterior rhonchi present  GI: soft, nt nd  Extremities: no edema   Resolved Hospital Problem list   treated HCAP with Moraxella and Haemophilus influenza, these were beta-lactamase positive, antibiotics were completed 5/6; COPD exacerbation. Ileus, AKI, hypernatremia Completed E. coli HCAP treatment on 5/18  Assessment & Plan:   Acute hypoxic respiratory failure with compromised airway. Failure to wean s/p tracheostomy. Hx of smoking with presumed COPD. Large Lt ICA/MCA CVA s/p thrombectomy and decompressive hemicraniectomy 2nd to cerebral edema and hemorrhage. Mild hypernatremia-->improved w/ Free water replacement  B/l lower extremity DVT.- s/p IVC filter 5/09, now on [facility] SVT.  New Sepsis, Fever, tachycardia  New Tracheitis  H. Influenza from the trach aspirate BCx NGTD   Plan Trach care Suctioning and clearance  Agree with abx  De-escalate once cultures finalize  If he needed vent support would need his trach exchanged for a cuff but looks like he his holding off now Repeat cxr to look for PNA.    Best practice:  Diet: EN  DVT prophylaxis:  IVC filter 5/9, anticoag w Eliquis GI prophylaxis: Protonix Mobility: PT/ OT Code Status: FULL Disposition: neuro floor > ? To rehab or SNF   Josephine Igo, DO [facility] 08/25/2019 11:22 AM

## 2019-08-25 NOTE — Consult Note (Signed)
[facility] for Infectious Disease    Date of Admission:  07/01/2019     Total days of antibiotics 4  Vancomycin, Cefepime, Metronidazole               Reason for Consult: Fever    Referring Provider: Leonie Man Primary Care Provider: Patient, No Pcp Per   Assessment: Lee Hooper is a 49 y.o. male here for prolonged hospitalization following catastrophic stroke. He has had trouble with DVT, HCAP/Aspiration pneumonias periodically requiring antibiotic treatment. He fairly suddenly started with high fevers to 103 over the weekend with leukocytosis to 16K. This corresponds with the reports that he has had drainage from posterior incision. Non-contrasted head CT yesterday with massive edema and unchanged midline shift.   The cause of his recurrent fevers is not clear, but I worry about CNS involvement. Would continue current antimicrobials for now and discuss further with neurology / neurosurgery. ?if contrasted study vs MRI would be more helpful to assess for infectious etiology.   While he has some secretions from trach, his oxygen requirements have remained the same, he is breathing quite comfortably on exam and his chest xrays are not overwhelming for lobar pneumonia. Blood cultures remain without growth.   Will continue to follow with you    Plan: 1. Continue current antimicrobials.  2. Follow blood cultures  3. 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 apixaban  5  mg Per Tube BID  . chlorhexidine gluconate (MEDLINE KIT)  15 mL Mouth Rinse BID  . Chlorhexidine Gluconate Cloth  6 each Topical Daily  . feeding supplement (OSMOLITE 1.5 CAL)  474 mL Per Tube TID  . feeding supplement (PRO-STAT SUGAR FREE 64)  30 mL Per Tube TID  . free water  150 mL Per Tube Q4H  . insulin aspart  0-9 Units Subcutaneous TID WC  . mouth rinse  15 mL Mouth Rinse q12n4p  . metoprolol tartrate  25 mg Per Tube BID  . multivitamin  15 mL Per Tube Daily  . mupirocin ointment   Nasal BID  . nutrition supplement (JUVEN)  1 packet Per Tube BID BM  . pantoprazole sodium  40 mg Per Tube Daily  . potassium chloride  40 mEq Per Tube Daily  . sodium chloride flush  10-40 mL Intracatheter Q12H    HPI: Lee Hooper is a 49 y.o. male admitted 2 months ago on 07/01/2019 from home due to right-sided hemiplegia and aphasia. Found to have acute left ICA occlusion with left M1 occlusion s/p thrombecomy --> this re-thrombosed and underwent decompressive hemicraniectomy.   PMHx includes COPD, tobacco use, psoriasis. Tracheostomy placed 5/8  He has had a prolonged hospitalization due to sequela of right sided hemiparesis, dysphagia.   Developed DVT --> s/p IVC filter 5/9 and Eliquis treatment  Required revision for scalp wound 6/4 and planned cranioplasty.  On 6/19 he developed high fevers to 7 F with CXR reflecting possible aspiration with  bibasilar opacities. He was started on Vancomycin, Cefepime and Metronidazole 4 days ago; he continues to have high and erratic fevers.   He has been treated a few times during hospitalization for HCAP / Aspiration pneumonia. He has had various cultures from tracheal aspirate - E coli 5/11 and more recently MSSA + H Flu. His nurse describes his secretions remain very thick. She states that there has been no drainage from tracheostomy site around the cannula.   He has had no changed in mental status recently as reported by his nurse however the  swelling is has worsened to her opinion compared to when she provided his care a few weeks ago. Also reports that there was some brown drainage from posterior scalp incision over weekend, to where now a scab remains.   Has one PIV in place L FA x 2 days now. Midline single lumen placed 6/21. Previous to this he had a R hand PIV in place x 21 days. He has G tube and catheter in place. Pressure injury stage 3 under the right side of tracheostomy site.    Review of Systems: Review of Systems  Unable to perform ROS: Patient nonverbal    Past Medical History:  Diagnosis Date  . COPD (chronic obstructive pulmonary disease) ([place])   . History of ischemic left MCA stroke 07/01/2019  . Psoriasis   . Tobacco dependence     Social History   Tobacco Use  . Smoking status: Current Every Day Smoker  . Smokeless tobacco: Never Used  Vaping Use  . Vaping Use: Never used  Substance Use Topics  . Alcohol use: Not on file  . Drug use: Not on file    History reviewed. No pertinent family history. Allergies  Allergen Reactions  . Tegaderm Ag Mesh [Silver] Other (See Comments)    blisters    OBJECTIVE: Blood pressure 131/85, pulse (!) 111, temperature (!) 101.9 F (38.8 C), temperature source Oral, resp. rate (!) 25, height 6' (1.829 m), weight 96.3 kg, SpO2 95 %.  Physical Exam Vitals reviewed.  Constitutional:      Comments: Makes eye contact and nods sometimes. Smiled appropriately during conversation.   HENT:     Head:     Comments: Large swollen defect to left temporal cranium.  Incision site as pictured below.     Mouth/Throat:     Mouth: Mucous membranes are moist.     Pharynx: Oropharynx is clear.  Neck:     Comments: Tracheostomy well seated, no drainage around cannula with dressing material clean and dry.  Cardiovascular:     Rate and Rhythm: Regular rhythm. Tachycardia present.     Heart sounds: No murmur heard.   Pulmonary:     Effort: Pulmonary effort is normal.      Breath sounds: Normal breath sounds.  Abdominal:     General: Abdomen is flat. Bowel sounds are normal.     Palpations: Abdomen is soft.     Comments: PEG site unremarkable  Musculoskeletal:     Comments: R side flaccid   Skin:    Comments: Multiple plaques of psoriasis on extensor surfaces and abdomen observed. None appear to have any secondary infection  2 PIVs in the left arm - all appear well and clean   Neurological:     Mental Status: He is alert.     Lab Results Lab Results  Component Value Date   WBC 8.7 08/25/2019   HGB 11.9 (L) 08/25/2019   HCT 38.0 (L) 08/25/2019  MCV 97.4 08/25/2019   PLT 205 08/25/2019    Lab Results  Component Value Date   CREATININE 0.72 08/25/2019   BUN 14 08/25/2019   NA 141 08/25/2019   K 3.4 (L) 08/25/2019   CL 108 08/25/2019   CO2 28 08/25/2019    Lab Results  Component Value Date   ALT 41 08/24/2019   AST 24 08/24/2019   ALKPHOS 54 08/24/2019   BILITOT 0.7 08/24/2019     Microbiology: Recent Results (from the past 240 hour(s))  Culture, respiratory     Status: None (Preliminary result)   Collection Time: 08/22/19  3:11 PM   Specimen: Tracheal Aspirate  Result Value Ref Range Status   Specimen Description TRACHEAL ASPIRATE  Final   Special Requests NONE  Final   Gram Stain   Final    FEW WBC PRESENT, PREDOMINANTLY PMN FEW GRAM NEGATIVE RODS    Culture   Final    MODERATE HAEMOPHILUS INFLUENZAE BETA LACTAMASE POSITIVE FEW STAPHYLOCOCCUS AUREUS SUSCEPTIBILITIES TO FOLLOW Performed at [facility], [address], [place], [place] [address]    Report Status PENDING  Incomplete  Culture, blood (x 2)     Status: None (Preliminary result)   Collection Time: 08/22/19  3:32 PM   Specimen: BLOOD  Result Value Ref Range Status   Specimen Description BLOOD LEFT ANTECUBITAL  Final   Special Requests   Final    BOTTLES DRAWN AEROBIC AND ANAEROBIC Blood Culture results may not be optimal due to an excessive volume of  blood received in culture bottles   Culture   Final    NO GROWTH 3 DAYS Performed at [facility], [place] [address], [place], [place] [address]    Report Status PENDING  Incomplete  Culture, blood (x 2)     Status: None (Preliminary result)   Collection Time: 08/22/19  3:37 PM   Specimen: BLOOD  Result Value Ref Range Status   Specimen Description BLOOD LEFT ANTECUBITAL  Final   Special Requests   Final    AEROBIC BOTTLE ONLY Blood Culture results may not be optimal due to an excessive volume of blood received in culture bottles   Culture   Final    NO GROWTH 3 DAYS Performed at [facility], [place] [address], [place], [place] [address]    Report Status PENDING  Incomplete  MRSA PCR Screening     Status: None   Collection Time: 08/24/19  4:35 PM   Specimen: Nasal Mucosa; Nasopharyngeal  Result Value Ref Range Status   MRSA by PCR NEGATIVE NEGATIVE Final    Comment:        The GeneXpert MRSA Assay (FDA approved for NASAL specimens only), is one component of a comprehensive MRSA colonization surveillance program. It is not intended to diagnose MRSA infection nor to guide or monitor treatment for MRSA infections. Performed at [facility], [place] [address] [place],  [address]      Janene Madeira, MSN, NP-C [facility] [email or web address] Pager: [phone] Office: [phone] [facility] Main Line: [phone]   08/25/2019 4:19 PM

## 2019-08-25 NOTE — Progress Notes (Signed)
RN administered IV metoprolol at 1613 for pt HR >115. No change in HR for one hour and HR sustaining in the 120s. MD Dahal notified. New orders placed.

## 2019-08-25 NOTE — Progress Notes (Signed)
PROGRESS NOTE  Lee Hooper  DOB: 09/06/1970  PCP: Patient, No Pcp Per TDS:[id number]  DOA: 07/01/2019  LOS: 60 days   Chief Complaint  Patient presents with  . Code Stroke   Brief narrative: Lee Hooper is a 49 y.o. male , chronic daily smoker with COPD and psoriasis who was admitted on 4/28 primarily under neurology service.   Medicine consultation was: 6/19 for comanagement of medical issues.    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  Underwent arteriogram with revascularization/thrombectomy but had rethrombosis and hence underwent L depressive hemicraniectomy.  He remained intubated in ICU postprocedure. Could not be extubated. 5/8 -trach placement.  Also found to have bilateral lower extremity DVT. 5/9 -IVC filter placement.  5/23 -Eliquis resumed for DVT 6/4 - required scalp wound revision on 6/4 and is planned for cranioplasty next week.  6/6 - transfered out of ICU. 6/17 -started on dysphagia diet 6/19 -started having fever up to 103.2, tachycardia.  Chest x-ray with possible aspiration.  Sepsis work-up started and hospitalist service was consulted.  Subjective: Patient was seen and examined this morning.  Middle-aged Caucasian male.   Alert, awake, blinks eyes to acknowledge understanding.  Able to follow command when asked to move left extremities. Dense right hemiparesis.  Chart reviewed. Patient continues to have fever more than 101 multiple times.  Tachycardic while febrile more than 120s On 5 L oxygen by trach collar Repeat labs this morning showed WBC count improved down to 8.7, sodium level normal, creatinine normal as well.    Assessment/Plan: Sepsis likely secondary to aspiration pneumonia -Unfortunate young man with catastrophic CVA in April, still recovering with dense R hemiparesis -6-17 -started on Dysphagia 1 diet on 6/17 -6/19 -started having fever, tachycardia. -CXR with possible bibasilar opacification,  not overly convincing -Lactic acid was elevated.  However procalcitonin level is less than 0.1. -Started on sepsis protocol with IV fluid, blood culture, urine culture and initiation of broad-spectrum IV antibiotics. -Patient still remains on IV Cefepime/Vanc/Flagyl for undifferentiated sepsis -Lactic acid normal on repeat check this morning. -Despite improvement in white count and lactic acid, patient continues to have persistent fever of more than 101 on broad-spectrum antibiotics. -I requested for infectious disease consult this morning.  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  Underwent arteriogram with revascularization/thrombectomy but had rethrombosis and hence underwent L depressive hemicraniectomy.  -Trach/PEG in place -Planned for cranioplasty soon but may need to delay depending on infection -Continue PEG tube feeding.  DVT -5/8 -extensive B LE DVT -5/9  -IVC filter placed  -5/23 -Eliquis initiated  Psoriasis -No apparent treatment as outpatient  Hyperglycemia - A1c 5.5 on 4/29 -Continue sliding scale with Accu-Cheks  Pressure injury -Stage 1 sacral ulcer, appears unrelated to current sepsis Pressure Injury 07/15/19 Other (Comment) Right Stage 3 -  Full thickness tissue loss. Subcutaneous fat may be visible but bone, tendon or muscle are NOT exposed. reddened, open indention under right side of tracheostomy (Active)  07/15/19 0330  Location: Other (Comment) (under tracheostomy)  Location Orientation: Right  Staging: Stage 3 -  Full thickness tissue loss. Subcutaneous fat may be visible but bone, tendon or muscle are NOT exposed.  Wound Description (Comments): reddened, open indention under right side of tracheostomy  Present on Admission: No    Nutrition Problem: Inadequate oral intake Etiology: inability to eat  Signs/Symptoms: NPO status  Mobility: PT following Code Status:   Code Status: Prior  DVT prophylaxis:  Eliquis Antimicrobials:   Cefepime, metronidazole, vancomycin Fluid: Continue LR at 75.  Diet: PEG tube feeding ongoing.  Oral feeding has been held.  Family Communication:  Called and updated patient's wife yesterday afternoon.  Infusions:  . sodium chloride Stopped (07/21/19 0012)  . ceFEPime (MAXIPIME) IV 2 g (08/25/19 0526)  . lactated ringers 75 mL/hr at 08/24/19 2122  . metronidazole 500 mg (08/25/19 0640)  . vancomycin 1,500 mg (08/25/19 0433)    Scheduled Meds: . apixaban  5 mg Per Tube BID  . chlorhexidine gluconate (MEDLINE KIT)  15 mL Mouth Rinse BID  . Chlorhexidine Gluconate Cloth  6 each Topical Daily  . feeding supplement (OSMOLITE 1.5 CAL)  474 mL Per Tube TID  . feeding supplement (PRO-STAT SUGAR FREE 64)  30 mL Per Tube TID  . free water  150 mL Per Tube Q4H  . insulin aspart  0-9 Units Subcutaneous TID WC  . mouth rinse  15 mL Mouth Rinse q12n4p  . metoprolol tartrate  25 mg Per Tube BID  . multivitamin  15 mL Per Tube Daily  . mupirocin ointment   Nasal BID  . nutrition supplement (JUVEN)  1 packet Per Tube BID BM  . pantoprazole sodium  40 mg Per Tube Daily  . sodium chloride flush  10-40 mL Intracatheter Q12H    Antimicrobials: Anti-infectives (From admission, onward)   Start     Dose/Rate Route Frequency Ordered Stop   08/24/19 1610  vancomycin (VANCOREADY) IVPB 1500 mg/300 mL     Discontinue     1,500 mg 150 mL/hr over 120 Minutes Intravenous Every 12 hours 08/24/19 1610     08/23/19 0400  vancomycin (VANCOREADY) IVPB 1250 mg/250 mL  Status:  Discontinued        1,250 mg 166.7 mL/hr over 90 Minutes Intravenous Every 12 hours 08/22/19 1526 08/24/19 1610   08/22/19 1530  metroNIDAZOLE (FLAGYL) IVPB 500 mg     Discontinue     500 mg 100 mL/hr over 60 Minutes Intravenous Every 8 hours 08/22/19 1511     08/22/19 1530  ceFEPIme (MAXIPIME) 2 g in sodium chloride 0.9 % 100 mL IVPB     Discontinue     2 g 200 mL/hr over 30 Minutes Intravenous Every 8 hours 08/22/19 1525      08/22/19 1530  vancomycin (VANCOREADY) IVPB 1750 mg/350 mL        1,750 mg 175 mL/hr over 120 Minutes Intravenous  Once 08/22/19 1526 08/22/19 1814   08/22/19 1515  ceFEPIme (MAXIPIME) 2 g in sodium chloride 0.9 % 100 mL IVPB  Status:  Discontinued        2 g 200 mL/hr over 30 Minutes Intravenous  Once 08/22/19 1511 08/22/19 1525   08/22/19 1515  vancomycin (VANCOCIN) IVPB 1000 mg/200 mL premix  Status:  Discontinued        1,000 mg 200 mL/hr over 60 Minutes Intravenous  Once 08/22/19 1511 08/22/19 1525   07/28/19 0800  vancomycin (VANCOCIN) IVPB 1000 mg/200 mL premix  Status:  Discontinued        1,000 mg 200 mL/hr over 60 Minutes Intravenous Every 8 hours 07/27/19 2300 07/30/19 1522   07/27/19 2300  ceFEPIme (MAXIPIME) 2 g in sodium chloride 0.9 % 100 mL IVPB  Status:  Discontinued        2 g 200 mL/hr over 30 Minutes Intravenous Every 8 hours 07/27/19 2251 08/02/19 0927   07/27/19 2300  vancomycin (VANCOREADY) IVPB 2000 mg/400 mL  2,000 mg 200 mL/hr over 120 Minutes Intravenous  Once 07/27/19 2258 07/28/19 0130   07/27/19 2245  ceFEPIme (MAXIPIME) 2 g in sodium chloride 0.9 % 100 mL IVPB  Status:  Discontinued        2 g 200 mL/hr over 30 Minutes Intravenous Every 12 hours 07/27/19 2244 07/27/19 2251   07/17/19 1400  ceFAZolin (ANCEF) IVPB 2g/100 mL premix        2 g 200 mL/hr over 30 Minutes Intravenous Every 8 hours 07/17/19 0922 07/20/19 2213   07/16/19 1400  ceFEPIme (MAXIPIME) 2 g in sodium chloride 0.9 % 100 mL IVPB  Status:  Discontinued        2 g 200 mL/hr over 30 Minutes Intravenous Every 8 hours 07/16/19 1132 07/17/19 0922   07/16/19 1200  vancomycin (VANCOREADY) IVPB 1250 mg/250 mL  Status:  Discontinued        1,250 mg 166.7 mL/hr over 90 Minutes Intravenous Every 24 hours 07/15/19 1333 07/17/19 0923   07/15/19 2200  ceFEPIme (MAXIPIME) 2 g in sodium chloride 0.9 % 100 mL IVPB  Status:  Discontinued        2 g 200 mL/hr over 30 Minutes Intravenous Every 12 hours  07/15/19 1253 07/16/19 1132   07/15/19 1200  vancomycin (VANCOREADY) IVPB 1500 mg/300 mL  Status:  Discontinued        1,500 mg 150 mL/hr over 120 Minutes Intravenous Every 24 hours 07/14/19 1037 07/15/19 1334   07/14/19 1400  ceFEPIme (MAXIPIME) 2 g in sodium chloride 0.9 % 100 mL IVPB  Status:  Discontinued        2 g 200 mL/hr over 30 Minutes Intravenous Every 8 hours 07/14/19 1015 07/15/19 1253   07/14/19 1045  vancomycin (VANCOREADY) IVPB 1750 mg/350 mL        1,750 mg 175 mL/hr over 120 Minutes Intravenous  Once 07/14/19 1037 07/14/19 1455   07/08/19 1100  Ampicillin-Sulbactam (UNASYN) 3 g in sodium chloride 0.9 % 100 mL IVPB        3 g 200 mL/hr over 30 Minutes Intravenous Every 6 hours 07/08/19 1057 07/09/19 2319   07/07/19 2200  amoxicillin-clavulanate (AUGMENTIN) 875-125 MG per tablet 1 tablet  Status:  Discontinued        1 tablet Per Tube Every 12 hours 07/07/19 1546 07/08/19 1057   07/05/19 1500  amoxicillin-clavulanate (AUGMENTIN) 875-125 MG per tablet 1 tablet  Status:  Discontinued        1 tablet Per Tube Every 12 hours 07/05/19 1214 07/07/19 1546   07/03/19 1000  ceFEPIme (MAXIPIME) 2 g in sodium chloride 0.9 % 100 mL IVPB  Status:  Discontinued        2 g 200 mL/hr over 30 Minutes Intravenous Every 8 hours 07/03/19 0926 07/05/19 1214   07/01/19 0923  ceFAZolin (ANCEF) 2-4 GM/100ML-% IVPB       Note to Pharmacy: Domenick Bookbinder   : cabinet override      07/01/19 0923 07/01/19 2129      PRN meds: sodium chloride, acetaminophen, fentaNYL (SUBLIMAZE) injection, ibuprofen, iohexol, ipratropium-albuterol, ketorolac, metoprolol tartrate, ondansetron **OR** ondansetron (ZOFRAN) IV, promethazine, sodium chloride flush   Objective: Vitals:   08/25/19 0925 08/25/19 1025  BP: 126/83 131/79  Pulse: 96 (!) 120  Resp: (!) 25 (!) 27  Temp: (!) 101.6 F (38.7 C) (!) 101.9 F (38.8 C)  SpO2: 95% 94%    Intake/Output Summary (Last 24 hours) at 08/25/2019 1039 Last data filed  at 08/24/2019 2211  Gross per 24 hour  Intake 6229.13 ml  Output 700 ml  Net 5529.13 ml   Filed Weights   08/23/19 0500 08/24/19 0500 08/25/19 0443  Weight: 97 kg 96.5 kg 96.3 kg   Weight change: -0.2 kg Body mass index is 28.79 kg/m.   Physical Exam: General exam: Middle-aged Caucasian male.  Eyes open.  Nods head to acknowledge my presence.  Not in physical distress.  Looks tired today. Skin: No rashes, lesions or ulcers. HEENT: Left hemicraniectomy status.  Bulging on the left side of the head with herniation of intracranial contents.   Lungs: Clear to auscultation bilaterally.  Fair respiratory effort.  Has trach collar anteriorly in the neck CVS: Tachycardic, no murmur GI/Abd soft, nontender, nondistended, bowel sound present CNS: Alert, awake, followd verbal command to move the left side. Right dense hemiplegia. Psychiatry: Depressed look Extremities: No pedal edema, no calf tenderness  Data Review: I have personally reviewed the laboratory data and studies available.  Recent Labs  Lab 08/19/19 0652 08/22/19 1255 08/22/19 1915 08/24/19 0440 08/25/19 0500  WBC 6.3 13.4* 16.0* 12.5* 8.7  NEUTROABS  --   --   --  9.3* 6.1  HGB 14.3 14.2 15.7 12.0* 11.9*  HCT 45.0 43.9 49.2 38.5* 38.0*  MCV 94.9 93.8 96.1 97.7 97.4  PLT 191 225 242 164 205   Recent Labs  Lab 08/19/19 0652 08/22/19 1255 08/22/19 1915 08/24/19 0440 08/25/19 0500  NA 137 134* 131* 138 141  K 4.2 3.8 4.3 3.6 3.4*  CL 102 100 97* 107 108  CO2 _0 GLUCOSE 101* 145* 150* 106* 93  BUN 20 23* 21* 13 14  CREATININE 0.60* 0.84 0.87 0.65 0.72  CALCIUM 9.6 9.3 9.2 8.7* 8.8*  MG  --   --   --  1.9  --   PHOS  --   --   --  2.6  --    Recent Labs  Lab 08/22/19 1537 08/22/19 1915 08/22/19 2330  LATICACIDVEN 2.2* 3.3* 1.7  PROCALCITON <0.10  --   --      Signed, Terrilee Croak, MD [facility] Pager: [phone] (Secure Chat preferred). 08/25/2019

## 2019-08-25 NOTE — Progress Notes (Signed)
STROKE PROGRESS NOTE INTERVAL HISTORY  Unfortunately, patient continues to be febrile 100.9.  He is on antibiotics but has  no clear source of infection.  Hospitalist is helping with management. He is getting response to tylenol but is shivering. No neurological changes.  Vital signs stable.  CBC is normal.  BMP is significant only for slightly low potassium of 3.4 Vitals:   08/25/19 1100 08/25/19 1144 08/25/19 1246 08/25/19 1340  BP:  106/68  116/72  Pulse: (!) 136 (!) 111  (!) 121  Resp: (!) 30 (!) 24  (!) 25  Temp:  99.9 F (37.7 C) (!) 100.9 F (38.3 C) 100 F (37.8 C)  TempSrc:  Oral Oral Oral  SpO2: 91% 96%  92%  Weight:      Height:       CBC:  Recent Labs  Lab 08/24/19 0440 08/25/19 0500  WBC 12.5* 8.7  NEUTROABS 9.3* 6.1  HGB 12.0* 11.9*  HCT 38.5* 38.0*  MCV 97.7 97.4  PLT 164 205   Basic Metabolic Panel:  Recent Labs  Lab 08/24/19 0440 08/25/19 0500  NA 138 141  K 3.6 3.4*  CL 107 108  CO2 22 28  GLUCOSE 106* 93  BUN 13 14  CREATININE 0.65 0.72  CALCIUM 8.7* 8.8*  MG 1.9  --   PHOS 2.6  --     IMAGING past 24 hours CT HEAD WO CONTRAST  Result Date: 08/24/2019 CLINICAL DATA:  Stroke follow-up EXAM: CT HEAD WITHOUT CONTRAST TECHNIQUE: Contiguous axial images were obtained from the base of the skull through the vertex without intravenous contrast. COMPARISON:  None. FINDINGS: Brain: Redemonstration of left anterior and middle cerebral artery territory infarction with large area of cystic encephalomalacia and herniation of intracranial contents through a large left craniectomy defect. The amount of extracranial herniation as increased and now wraps around the anterior and superior margin of the craniectomy site. Rightward midline shift of approximately 5 mm is unchanged. No acute hemorrhage. Vascular: Left middle cerebral artery remains dense. Skull: Large left craniectomy defect Sinuses/Orbits: No acute finding. Other: None. IMPRESSION: 1. Redemonstration of  left anterior and middle cerebral artery territory infarction with large area of cystic encephalomalacia and herniation of intracranial contents through a large left craniectomy defect. 2. Unchanged rightward midline shift of approximately 5 mm. 3. No acute hemorrhage. Electronically Signed   By: Deatra Robinson M.D.   On: 08/24/2019 20:28   DG CHEST PORT 1 VIEW  Result Date: 08/25/2019 CLINICAL DATA:  Hypoxemia EXAM: PORTABLE CHEST 1 VIEW COMPARISON:  08/22/2019 FINDINGS: Tracheostomy tube in satisfactory position. Trace right pleural effusion. Mild right basilar atelectasis. No pneumothorax. Stable cardiomediastinal silhouette. No aggressive osseous lesion. IMPRESSION: Trace right pleural effusion. Mild right basilar atelectasis. Electronically Signed   By: Elige Ko   On: 08/25/2019 12:16    PHYSICAL EXAM  General- middle-aged Caucasian male, s/p tracheostomy on trach collar, sleeping but arousable.   Ophthalmologic- fundi not visualized due to noncooperation.  Cardiovascular - Regular rate and rhythm.  Neuro - s/p tracheostomy on trach collar.  Eyes open but the patient is mute.  Currently febrile 103 and shivering.  Global aphasia, left gaze preference position, barely cross midline, notblinking to visual threat on the left, PERRL. Right facial droop. Tongue protrusion not cooperative. LUE spontaneous movement at least 4/5, LLE against gravity at least 3/5, right UE and LE flaccid still and does not seem to be developing rigidity or spasticity at this time, slight 1/5 movement with pain at  RLE no withdrawal to pain in upper. Sensation, coordination and gait not tested. Not cooperative this morning.    ASSESSMENT/PLAN Lee Hooper is a 49 y.o. male with history of COPD, ongoing tobacco use, and psoriasis, presenting with dense LMCA syndrome. Attempt for Left ICA and Left M1 thrombectomy was done emergently with TICI 2b flow. Unfortunately, the pt had post procedure hemorrhage  and reocclusion of the vessels. F/u imaging suggested early edema and pt underwent prophylactic decompressive hemicraniectomy.  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 Stroke etiology unknown - given DVT requiring eventual AC and high mRS, will not do further embolic workup  Eliquis for VTE prophylaxis  No antithrombotic prior to admission, now on Eliquis.  Therapy recommendations:  CIR   Disposition:  Pending  Left depressive hemicraniectomy on 07/01/19 Dr Christella Noa  Dr. Christella Noa  for cranioplasty, earliest next week.   Respiratory failure s/p Trach Trach wound  Downsized to cuffless trach 6 by CCM 5/24  Fever Tachycardia  Leukocytosis (believed aspiration pneumonia ->sepsis)  Temp - 103.2->102.9->102.2->100  Tachycardia - 127->133->130->86-> (metoprolol given by nursing per standing order) ->112  Known DVT - possible PE? - Chest CTA considered but pt already on Eliquis  IVC filter placed 07/12/19   Eliquis restarted 07/26/19 (5 mg Bid)  ABGs 08/22/19 - WNL except PO2 - 69.3  (CMP - unremarkable)  ;  (CBC - WBCs 13.4->16.0)  ;  (UA - cloudy with rare bacteria)  CXR - 6/19 - Minimal bibasilar opacification which may be due to atelectasis or infection.  Blood cultures -  No growth < 24 hrs  Lactic Acid - 2.2->3.3->1.7  Procalcitonin < 0.10  Respiratory cultures - pending  Dr Pietro Cassis consulting  Maxipime, Metronidazole and Vancomycin started 08/22/19 for Sepsis   Labs in AM   Appreciate Hospitalists' assistance  Hyperglycemia  On TF  Added SSI  Bilateral Lower Ext DVT, extensive Possible PE given tachycardia  Venous US 07/11/2019 DVT + bilateral extensive  IVC filter placed  5/9  On eliquis now for DVT treatment and prophylaxis  Dysphagia   PEG placement 5/14  FW 150 Q4h   On bolus TF -> cut in half to facilitate po intake   MBSS cleared for D1 nectar thick liquids  SLP following for tolerance  Tobacco abuse  Current smoker  Smoking cessation counseling will be provided if able  Other Stroke Risk Factors  Obesity, Body mass index is 28.79 kg/m., recommend weight loss, diet and exercise as appropriate    Likely undiagnosed obstructive sleep apnea based on body habitus  Other Active Problems  Na - [facility] day # 55  Continue antibiotics as per medical hospitalist team recommendations for his continuing fever.  Await ID consult for persistent fever.  Replace potassium..  Discussed with Dr. Pietro Cassis. Greater than 50% time during this 25-minute visit was spent on counseling and coordination of care and discussion with care team.  Discussed with Dr. Luna Kitchens, MD   To contact Stroke Continuity provider, please refer to [email or web address] After hours, contact General Neurology

## 2019-08-26 LAB — BLOOD GAS, ARTERIAL
Acid-Base Excess: 1.1 mmol/L (ref 0.0–2.0)
Bicarbonate: 25 mmol/L (ref 20.0–28.0)
FIO2: 60
O2 Saturation: 93.8 %
Patient temperature: 37.5
pCO2 arterial: 38.7 mmHg (ref 32.0–48.0)
pH, Arterial: 7.427 (ref 7.350–7.450)
pO2, Arterial: 68 mmHg — ABNORMAL LOW (ref 83.0–108.0)

## 2019-08-26 LAB — BASIC METABOLIC PANEL
Anion gap: 8 (ref 5–15)
BUN: 14 mg/dL (ref 6–20)
CO2: 25 mmol/L (ref 22–32)
Calcium: 8.9 mg/dL (ref 8.9–10.3)
Chloride: 105 mmol/L (ref 98–111)
Creatinine, Ser: 0.72 mg/dL (ref 0.61–1.24)
GFR calc Af Amer: >60 mL/min (ref >60)
GFR calc non Af Amer: >60 mL/min (ref >60)
Glucose, Bld: 103 mg/dL — ABNORMAL HIGH (ref 70–99)
Potassium: 4 mmol/L (ref 3.5–5.1)
Sodium: 138 mmol/L (ref 135–145)

## 2019-08-26 LAB — CBC WITH DIFFERENTIAL/PLATELET
Abs Immature Granulocytes: 0.07 10*3/uL (ref 0.00–0.07)
Basophils Absolute: 0 10*3/uL (ref 0.0–0.1)
Basophils Relative: 1 %
Eosinophils Absolute: 0.1 10*3/uL (ref 0.0–0.5)
Eosinophils Relative: 1 %
HCT: 36.9 % — ABNORMAL LOW (ref 39.0–52.0)
Hemoglobin: 11.7 g/dL — ABNORMAL LOW (ref 13.0–17.0)
Immature Granulocytes: 1 %
Lymphocytes Relative: 19 %
Lymphs Abs: 1.7 10*3/uL (ref 0.7–4.0)
MCH: 30.7 pg (ref 26.0–34.0)
MCHC: 31.7 g/dL (ref 30.0–36.0)
MCV: 96.9 fL (ref 80.0–100.0)
Monocytes Absolute: 0.8 10*3/uL (ref 0.1–1.0)
Monocytes Relative: 9 %
Neutro Abs: 6.3 10*3/uL (ref 1.7–7.7)
Neutrophils Relative %: 69 %
Platelets: 232 10*3/uL (ref 150–400)
RBC: 3.81 MIL/uL — ABNORMAL LOW (ref 4.22–5.81)
RDW: 15.9 % — ABNORMAL HIGH (ref 11.5–15.5)
WBC: 8.9 10*3/uL (ref 4.0–10.5)
nRBC: 0 % (ref 0.0–0.2)

## 2019-08-26 LAB — GLUCOSE, CAPILLARY
Glucose-Capillary: 100 mg/dL — ABNORMAL HIGH (ref 70–99)
Glucose-Capillary: 124 mg/dL — ABNORMAL HIGH (ref 70–99)
Glucose-Capillary: 149 mg/dL — ABNORMAL HIGH (ref 70–99)
Glucose-Capillary: 167 mg/dL — ABNORMAL HIGH (ref 70–99)
Glucose-Capillary: 181 mg/dL — ABNORMAL HIGH (ref 70–99)
Glucose-Capillary: 95 mg/dL (ref 70–99)

## 2019-08-26 LAB — CULTURE, RESPIRATORY W GRAM STAIN

## 2019-08-26 LAB — VANCOMYCIN, TROUGH: Vancomycin Tr: 10 ug/mL — ABNORMAL LOW (ref 15–20)

## 2019-08-26 MED ORDER — VANCOMYCIN HCL 1250 MG/250ML IV SOLN
1250.0000 mg | Freq: Three times a day (TID) | INTRAVENOUS | Status: DC
  Administered 2019-08-27 – 2019-08-28 (×5): 1250 mg via INTRAVENOUS
  Filled 2019-08-26 (×6): qty 250

## 2019-08-26 NOTE — Progress Notes (Signed)
Physical Therapy Treatment Patient Details Name: Lee Hooper MRN: [id number] DOB: 28-Oct-1970 Today's Date: 08/26/2019    History of Present Illness This 49 y.o. male admitted 4/28 with Rt sided hemiplegia and aphasia.  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  Repeat CT following procedure showed moderate basal ganglia hemorrhage vs. stroke   Repeat MRI revealed that the ICA and MCA re-occluded with change in FLAIR sequence excluding him from repeat thrombectomy.  He underwent a decompressive hemicraniectomy.  He was intubated 4/28, trach 5/8; liberated from ventilator 5/14.  He developed an ileus 5/2.  5/9 IVC filter placed due to bil LE DVTs (extensive). PEG 5/14. PMH includes: COPD, tobacco abuse, psoriasis    PT Comments    Patient seen for mobility progression. This session focused on bed mobility and sitting balance EOB. Pt requires total A +2 for bed mobility and able to maintain sitting balance with L UE support and min guard assist for safety.  PT will continue to follow acutely and progress as tolerated.    Follow Up Recommendations  CIR     Equipment Recommendations  Wheelchair (measurements PT);Wheelchair cushion (measurements PT);Hospital bed    Recommendations for Other Services       Precautions / Restrictions Precautions Precautions: Fall Precaution Comments: R hemi, trach (has PMSV), PEG, crani-No Bone flap L head; rt shoulder pain/subluxation; bil LE DVTs with IVC filter Restrictions Weight Bearing Restrictions: No    Mobility  Bed Mobility Overal bed mobility: Needs Assistance Bed Mobility: Sit to Sidelying;Supine to Sit     Supine to sit: +2 for physical assistance;HOB elevated;Total assist   Sit to sidelying: Total assist;+2 for physical assistance General bed mobility comments: total A +2 for all aspects of bed mobility with pt exiting to R side of bed. scooted pts hip backwards and diagonolly to avoid coming down on R subluxed  extremity when returning to supine  Transfers                 General transfer comment: unable to tranfser this session secondary to safety  Ambulation/Gait                 Stairs             Wheelchair Mobility    Modified Rankin (Stroke Patients Only) Modified Rankin (Stroke Patients Only) Pre-Morbid Rankin Score: No symptoms Modified Rankin: Severe disability     Balance Overall balance assessment: Needs assistance Sitting-balance support: Feet supported;Single extremity supported Sitting balance-Leahy Scale: Poor Sitting balance - Comments: worked on midline and upright posture; attempting more cervical extension; pt able to maintain sitting balance EOB holding onto rail with L UE with min guard assist for safety, pt was able to sit with BUE in lap for briefly periods with close min guard. Postural control: Right lateral lean                                  Cognition Arousal/Alertness: Awake/alert;Lethargic (period of wakefulness and lethargy) Behavior During Therapy: Flat affect Overall Cognitive Status: Impaired/Different from baseline Area of Impairment: Attention;Following commands;Problem solving;Awareness                   Current Attention Level: Sustained   Following Commands: Follows one step commands with increased time;Follows one step commands inconsistently Safety/Judgement: Decreased awareness of deficits;Decreased awareness of safety Awareness: Intellectual Problem Solving: Slow processing;Requires verbal cues;Requires tactile  cues;Decreased initiation;Difficulty sequencing General Comments: pt more flat this session, asleep upon arrival but arouses with stimulation. Pt initially resistant to movement      Exercises      General Comments        Pertinent Vitals/Pain Pain Assessment: Faces Faces Pain Scale: Hurts little more Pain Location: grimacing and holding L side of head when returning to supine  from sitting     Home Living                      Prior Function            PT Goals (current goals can now be found in the care plan section) Acute Rehab PT Goals Patient Stated Goal: nods head "yes" to sit at EOB Progress towards PT goals: Progressing toward goals    Frequency    Min 3X/week      PT Plan Current plan remains appropriate    Co-evaluation PT/OT/SLP Co-Evaluation/Treatment: Yes Reason for Co-Treatment: Complexity of the patient's impairments (multi-system involvement);Necessary to address cognition/behavior during functional activity;To address functional/ADL transfers;For patient/therapist safety PT goals addressed during session: Mobility/safety with mobility;Balance OT goals addressed during session: ADL's and self-care      AM-PAC PT "6 Clicks" Mobility   Outcome Measure  Help needed turning from your back to your side while in a flat bed without using bedrails?: Total Help needed moving from lying on your back to sitting on the side of a flat bed without using bedrails?: Total Help needed moving to and from a bed to a chair (including a wheelchair)?: Total Help needed standing up from a chair using your arms (e.g., wheelchair or bedside chair)?: Total Help needed to walk in hospital room?: Total Help needed climbing 3-5 steps with a railing? : Total 6 Click Score: 6    End of Session Equipment Utilized During Treatment: Oxygen Activity Tolerance: Patient tolerated treatment well Patient left: with call bell/phone within reach;in bed;with bed alarm set;with nursing/sitter in room Nurse Communication: Mobility status;Need for lift equipment PT Visit Diagnosis: Other abnormalities of gait and mobility (R26.89);Hemiplegia and hemiparesis Hemiplegia - Right/Left: Right Hemiplegia - dominant/non-dominant: Dominant Hemiplegia - caused by: Nontraumatic intracerebral hemorrhage     Time: 5176-1607 PT Time Calculation (min) (ACUTE ONLY): 28  min  Charges:  $Therapeutic Activity: 8-22 mins                     Erline Levine, PTA Acute Rehabilitation Services Pager: [phone] Office: [phone]     Carolynne Edouard 08/26/2019, 5:03 PM

## 2019-08-26 NOTE — Progress Notes (Addendum)
PROGRESS NOTE  Lee Hooper  DOB: [birth year]  PCP: Patient, No Pcp Per [facility]:[id number]  DOA: 07/01/2019  LOS: 12 days   Chief Complaint  Patient presents with  . Code Stroke   Brief narrative: Lee Hooper is a 49 y.o. male , chronic daily smoker with COPD and psoriasis who was admitted on 4/28 primarily under neurology service.   Medicine consultation was: 6/19 for comanagement of medical issues.    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  Underwent arteriogram with revascularization/thrombectomy but had rethrombosis and hence underwent L depressive hemicraniectomy.  He remained intubated in ICU postprocedure. Could not be extubated. 5/8 -trach placement.  Also found to have bilateral lower extremity DVT. 5/9 -IVC filter placement.  5/23 -Eliquis resumed for DVT 6/4 -required scalp wound revision on 6/4 and is planned for cranioplasty next week.  6/6 -transfered out of ICU.  6/17 -started on dysphagia diet. 6/19 -started having fever up to 103.2, tachycardia. Chest x-ray with possible aspiration.  Sepsis work-up started and hospitalist service was consulted.  Subjective: Patient was seen and examined this morning.  Middle-aged Caucasian male.   Looks lethargic.  Tries to open eyes on verbal command.   Remains persistently febrile and tachycardic.  Assessment/Plan: Sepsis likely secondary to aspiration pneumonia -Unfortunate young man with catastrophic CVA in April, still recovering with dense R hemiparesis -6-17 -started on Dysphagia 1 diet on 6/17 -6/19 -started having fever, tachycardia. -CXR with possible bibasilar opacification, not overly convincing -Lactic acid was elevated.  However procalcitonin level is less than 0.1. -Started on sepsis protocol with IV fluid, blood culture, urine culture and initiation of broad-spectrum IV antibiotics. -Patient still remains on IV Cefepime/Vanc/Flagyl for undifferentiated sepsis -Lactic  acid normal on repeat check this morning. -Despite improvement in white count and lactic acid, patient continues to have persistent fever of more than 101 on broad-spectrum antibiotics. -ID consult appreciated.  Suspect CNS infection.  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  Underwent arteriogram with revascularization/thrombectomy but had rethrombosis and hence underwent L depressive hemicraniectomy.  -Trach/PEG in place -Planned for cranioplasty soon but may need to delay depending on infection -Continue PEG tube feeding.  DVT -5/8 -extensive B LE DVT -5/9  -IVC filter placed  -5/23 -Eliquis initiated  Psoriasis -No apparent treatment as outpatient  Hyperglycemia - A1c 5.5 on 4/29 -Continue sliding scale with Accu-Cheks  Pressure injury -Stage 1 sacral ulcer, appears unrelated to current sepsis Pressure Injury 07/15/19 Other (Comment) Right Stage 3 -  Full thickness tissue loss. Subcutaneous fat may be visible but bone, tendon or muscle are NOT exposed. reddened, open indention under right side of tracheostomy (Active)  07/15/19 0330  Location: Other (Comment) (under tracheostomy)  Location Orientation: Right  Staging: Stage 3 -  Full thickness tissue loss. Subcutaneous fat may be visible but bone, tendon or muscle are NOT exposed.  Wound Description (Comments): reddened, open indention under right side of tracheostomy  Present on Admission: No    Nutrition Problem: Inadequate oral intake Etiology: inability to eat  Signs/Symptoms: NPO status  Mobility: PT following Code Status:   Code Status: Prior  DVT prophylaxis:  Eliquis Antimicrobials:  Cefepime, metronidazole, vancomycin Fluid: Continue LR at 75.  Diet: PEG tube feeding ongoing.  Oral feeding has been held.  Family Communication:  None at bedside.  Infusions:  . sodium chloride Stopped (07/21/19 0012)  . ceFEPime (MAXIPIME) IV 2 g (08/26/19 0631)  . lactated ringers 75  mL/hr at 08/26/19  0133  . metronidazole 500 mg (08/26/19 0731)  . vancomycin 1,500 mg (08/26/19 0341)    Scheduled Meds: . apixaban  5 mg Per Tube BID  . chlorhexidine gluconate (MEDLINE KIT)  15 mL Mouth Rinse BID  . Chlorhexidine Gluconate Cloth  6 each Topical Daily  . feeding supplement (OSMOLITE 1.5 CAL)  474 mL Per Tube TID  . feeding supplement (PRO-STAT SUGAR FREE 64)  30 mL Per Tube TID  . free water  150 mL Per Tube Q4H  . insulin aspart  0-9 Units Subcutaneous TID WC  . mouth rinse  15 mL Mouth Rinse q12n4p  . metoprolol tartrate  37.5 mg Per Tube BID  . multivitamin  15 mL Per Tube Daily  . mupirocin ointment   Nasal BID  . nutrition supplement (JUVEN)  1 packet Per Tube BID BM  . pantoprazole sodium  40 mg Per Tube Daily  . sodium chloride flush  10-40 mL Intracatheter Q12H    Antimicrobials: Anti-infectives (From admission, onward)   Start     Dose/Rate Route Frequency Ordered Stop   08/24/19 1610  vancomycin (VANCOREADY) IVPB 1500 mg/300 mL     Discontinue     1,500 mg 150 mL/hr over 120 Minutes Intravenous Every 12 hours 08/24/19 1610     08/23/19 0400  vancomycin (VANCOREADY) IVPB 1250 mg/250 mL  Status:  Discontinued        1,250 mg 166.7 mL/hr over 90 Minutes Intravenous Every 12 hours 08/22/19 1526 08/24/19 1610   08/22/19 1530  metroNIDAZOLE (FLAGYL) IVPB 500 mg     Discontinue     500 mg 100 mL/hr over 60 Minutes Intravenous Every 8 hours 08/22/19 1511     08/22/19 1530  ceFEPIme (MAXIPIME) 2 g in sodium chloride 0.9 % 100 mL IVPB     Discontinue     2 g 200 mL/hr over 30 Minutes Intravenous Every 8 hours 08/22/19 1525     08/22/19 1530  vancomycin (VANCOREADY) IVPB 1750 mg/350 mL        1,750 mg 175 mL/hr over 120 Minutes Intravenous  Once 08/22/19 1526 08/22/19 1814   08/22/19 1515  ceFEPIme (MAXIPIME) 2 g in sodium chloride 0.9 % 100 mL IVPB  Status:  Discontinued        2 g 200 mL/hr over 30 Minutes Intravenous  Once 08/22/19 1511 08/22/19 1525   08/22/19 1515   vancomycin (VANCOCIN) IVPB 1000 mg/200 mL premix  Status:  Discontinued        1,000 mg 200 mL/hr over 60 Minutes Intravenous  Once 08/22/19 1511 08/22/19 1525   07/28/19 0800  vancomycin (VANCOCIN) IVPB 1000 mg/200 mL premix  Status:  Discontinued        1,000 mg 200 mL/hr over 60 Minutes Intravenous Every 8 hours 07/27/19 2300 07/30/19 1522   07/27/19 2300  ceFEPIme (MAXIPIME) 2 g in sodium chloride 0.9 % 100 mL IVPB  Status:  Discontinued        2 g 200 mL/hr over 30 Minutes Intravenous Every 8 hours 07/27/19 2251 08/02/19 0927   07/27/19 2300  vancomycin (VANCOREADY) IVPB 2000 mg/400 mL        2,000 mg 200 mL/hr over 120 Minutes Intravenous  Once 07/27/19 2258 07/28/19 0130   07/27/19 2245  ceFEPIme (MAXIPIME) 2 g in sodium chloride 0.9 % 100 mL IVPB  Status:  Discontinued        2 g 200 mL/hr over 30 Minutes Intravenous Every 12  hours 07/27/19 2244 07/27/19 2251   07/17/19 1400  ceFAZolin (ANCEF) IVPB 2g/100 mL premix        2 g 200 mL/hr over 30 Minutes Intravenous Every 8 hours 07/17/19 0922 07/20/19 2213   07/16/19 1400  ceFEPIme (MAXIPIME) 2 g in sodium chloride 0.9 % 100 mL IVPB  Status:  Discontinued        2 g 200 mL/hr over 30 Minutes Intravenous Every 8 hours 07/16/19 1132 07/17/19 0922   07/16/19 1200  vancomycin (VANCOREADY) IVPB 1250 mg/250 mL  Status:  Discontinued        1,250 mg 166.7 mL/hr over 90 Minutes Intravenous Every 24 hours 07/15/19 1333 07/17/19 0923   07/15/19 2200  ceFEPIme (MAXIPIME) 2 g in sodium chloride 0.9 % 100 mL IVPB  Status:  Discontinued        2 g 200 mL/hr over 30 Minutes Intravenous Every 12 hours 07/15/19 1253 07/16/19 1132   07/15/19 1200  vancomycin (VANCOREADY) IVPB 1500 mg/300 mL  Status:  Discontinued        1,500 mg 150 mL/hr over 120 Minutes Intravenous Every 24 hours 07/14/19 1037 07/15/19 1334   07/14/19 1400  ceFEPIme (MAXIPIME) 2 g in sodium chloride 0.9 % 100 mL IVPB  Status:  Discontinued        2 g 200 mL/hr over 30 Minutes  Intravenous Every 8 hours 07/14/19 1015 07/15/19 1253   07/14/19 1045  vancomycin (VANCOREADY) IVPB 1750 mg/350 mL        1,750 mg 175 mL/hr over 120 Minutes Intravenous  Once 07/14/19 1037 07/14/19 1455   07/08/19 1100  Ampicillin-Sulbactam (UNASYN) 3 g in sodium chloride 0.9 % 100 mL IVPB        3 g 200 mL/hr over 30 Minutes Intravenous Every 6 hours 07/08/19 1057 07/09/19 2319   07/07/19 2200  amoxicillin-clavulanate (AUGMENTIN) 875-125 MG per tablet 1 tablet  Status:  Discontinued        1 tablet Per Tube Every 12 hours 07/07/19 1546 07/08/19 1057   07/05/19 1500  amoxicillin-clavulanate (AUGMENTIN) 875-125 MG per tablet 1 tablet  Status:  Discontinued        1 tablet Per Tube Every 12 hours 07/05/19 1214 07/07/19 1546   07/03/19 1000  ceFEPIme (MAXIPIME) 2 g in sodium chloride 0.9 % 100 mL IVPB  Status:  Discontinued        2 g 200 mL/hr over 30 Minutes Intravenous Every 8 hours 07/03/19 0926 07/05/19 1214   07/01/19 0923  ceFAZolin (ANCEF) 2-4 GM/100ML-% IVPB       Note to Pharmacy: Domenick Bookbinder   : cabinet override      07/01/19 0923 07/01/19 2129      PRN meds: sodium chloride, acetaminophen, fentaNYL (SUBLIMAZE) injection, ibuprofen, iohexol, ipratropium-albuterol, ketorolac, metoprolol tartrate, ondansetron **OR** ondansetron (ZOFRAN) IV, promethazine, sodium chloride flush   Objective: Vitals:   08/26/19 0857 08/26/19 0918  BP: 120/75 121/84  Pulse: (!) 102 100  Resp: (!) 26 (!) 27  Temp:  99.5 F (37.5 C)  SpO2: 93% 95%    Intake/Output Summary (Last 24 hours) at 08/26/2019 1012 Last data filed at 08/25/2019 1145 Gross per 24 hour  Intake --  Output 250 ml  Net -250 ml   Filed Weights   08/24/19 0500 08/25/19 0443 08/26/19 0600  Weight: 96.5 kg 96.3 kg 99.8 kg   Weight change: 3.5 kg Body mass index is 29.84 kg/m.   Physical Exam: General exam: Middle-aged Caucasian male.  Looks  more lethargic today.  Skin: No rashes, lesions or ulcers. HEENT: Left  hemicraniectomy status.  Bulging on the left side of the head with herniation of intracranial contents.   Lungs: Clear to auscultation bilaterally.  Fair respiratory effort.  Has trach collar anteriorly in the neck CVS: Tachycardic, no murmur GI/Abd soft, nontender, nondistended, bowel sound present CNS: Alert, awake, followd verbal command to move the left side. Right dense hemiplegia. Psychiatry: Depressed look Extremities: No pedal edema, no calf tenderness  Data Review: I have personally reviewed the laboratory data and studies available.  Recent Labs  Lab 08/22/19 1255 08/22/19 1915 08/24/19 0440 08/25/19 0500 08/26/19 0405  WBC 13.4* 16.0* 12.5* 8.7 8.9  NEUTROABS  --   --  9.3* 6.1 6.3  HGB 14.2 15.7 12.0* 11.9* 11.7*  HCT 43.9 49.2 38.5* 38.0* 36.9*  MCV 93.8 96.1 97.7 97.4 96.9  PLT 225 242 164 205 232   Recent Labs  Lab 08/22/19 1255 08/22/19 1915 08/24/19 0440 08/25/19 0500 08/26/19 0405  NA 134* 131* 138 141 138  K 3.8 4.3 3.6 3.4* 4.0  CL 100 97* 107 108 105  CO2 _0 GLUCOSE 145* 150* 106* 93 103*  BUN 23* 21* _1 CREATININE 0.84 0.87 0.65 0.72 0.72  CALCIUM 9.3 9.2 8.7* 8.8* 8.9  MG  --   --  1.9  --   --   PHOS  --   --  2.6  --   --    Recent Labs  Lab 08/22/19 1537 08/22/19 1915 08/22/19 2330  LATICACIDVEN 2.2* 3.3* 1.7  PROCALCITON <0.10  --   --      Signed, Terrilee Croak, MD [facility] Pager: [phone] (Secure Chat preferred). 08/26/2019

## 2019-08-26 NOTE — Progress Notes (Signed)
RT called stat to patients room.  Patient shaking uncontrollably with fever.  SPO2 92% on .21ATC.  Suctioned pt for moderate th tan secretions.  ETCO2 had good color change BBS equal.  Placed on .60 ATC SPO2 94%.  ABG sent to lab.  Rapid response and RN at bedside.

## 2019-08-26 NOTE — Progress Notes (Addendum)
NAME:  Lee Hooper, MRN:  [id number], DOB:  Apr 07, 1970, LOS: 39 ADMISSION DATE:  07/01/2019, CONSULTATION DATE:  07/01/19 REFERRING MD:  Aroor MD, CHIEF COMPLAINT:  CVA    Brief History   Lee Hooper is a 49 yo M who presented as a code stroke after being found aphasic and nonverbal by his wife. CT/CTA studies showed left ICA/MCA thrombosis and pt was taken for IR thrombectomy for left ICA and left M1 occlusions. Repeat head CT was obtained following the procedure out of concern for possible hemorrhage and showed moderate basal ganglia hemorrhage vs stroke. Repeat MRI revealed that the ICA and MCA re-occluded with change in FLAIR sequence excluding him from repeat thrombectomy. He underwent a L hemicraniectomy. He was intubated for airway protection.  Past Medical History  Psoriasis  COPD  Tobacco Use  Significant Hospital Events   4/28: Admit; L ICA/MCA Thrombectomy with re-occlusion --> left hemicraniectomy, intubated for acute hypoxic respiratory failure  5/3: NAE. Was taken off precedex yesterday and pt opened his eyes. Prednisone 50 mg qd was begun. He had a persistent fever around 38.2, HR 110s, BP 140s/60s. Pt required increased O2 with secretions; CXR showed mild edema with small effusions c/f aspiration vs opacification. Blood culture x2 and urine culture collected, NGTD. ABG pending. Currently satting 92% on PRVC FiO2 80, PEEP 10, Plateau 22. WBC declined to 10.5 from 11.1, Na 151 from 156. Tube feeds were held d/t distended colonic loop in RLQ on KUB, to be continued today. Thrombocytopenia improved to 93 from 81.  5/4: Patient remains intubated with spontaneous movement of left arm and leg.  5/5: NAE. Pt remains intubated on PRN fetanyl. Will open eyes and spontaneously move left leg and arm.  5/6: NAE overnight. Intubated with no sedation. 5/7: NAE. Marland Kitchen Completed Unasyn yesterday. VSS, spO2 remains in low 90s the majority of the time on 620/40%/+5. Morning labs significant  for HCO3 of 19. Otherwise unremarkable.  5/8: Ongoing fever.  Obtained ultrasound, positive for lower extremity DVT bilaterally.  Tracheostomy placed at bedside.,  Did have some SVT this responded to treating fever and rate control. 5/9: IVC filter placed 5/11: no acute events overnight. Pt with similar exam as previous however tachycardic and febrile (tmax102.7) with a bit increased wbc and new R infiltrate noted in base. Pan cx and start empiric abx. 5/12 Tolerated 8 hours of trach collar  5/17 completed antibiotics, added IV Lasix, for ongoing high supplemental oxygen requirements.  Because of this we delayed transfer out 5/18 improved oxygenation, improved x-ray.  Weaning oxygen.  Transitioning to aerosol trach collar 5/18 to 5/24: making slow progress on I&O balance but oxygen requirements much improved. DOAC started 5/23 w/ no new issues. Will ask RT to change to cuffless.  Consults:  IR PCCM  Neuro (Primary) Neurosurgery  Procedures:  4/28 IR - L ICA/MCA Thrombectomy 4/28 L hemicraniectomy 4/28 Intubation 5/08 Trach   Significant Diagnostic Tests:  4/28 Admit CT Head Code Stroke:  Hyperdense left ICA and MCA compatible with acute thrombus. No acute infarct or hemorrhage.  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 The patient has minimal atherosclerotic disease. The left internal carotid artery is occluded through the terminus and extending in the left M1 M2 and M3 branches. There is poor collateral circulation on the left. There is a large territory infarct involving the left hemisphere. Infarct volume 266 mL.  4/28 Admit  MR Brain:  Restricted diffusion throughout much of the left MCA vascular territory consistent with acute ischemia. Restricted diffusion consistent with acute ischemia is also present within the paramedian left frontoparietal lobes, although this is less well assessed  due to the degree of motion degradation at the level of the vertex. There is little if any corresponding T2/FLAIR hyperintensity at these sites. No significant mass effect. No midline shift.  4/28 Post Thrombectomy CT Head: : 1. 3.0 x 2.6 x 3.9 cm region of hyperdensity centered within the left basal ganglia, left subinsular region and inferomedial left temporal lobe likely reflecting a combination of parenchymal hematoma and contrast staining.  2. Edema with loss of gray-white differentiation within the left basal ganglia, left insula and anterior left temporal lobe, likely acute infarction. Subtle changes of acute infarction are also suspected within the paramedian left frontal lobe ACA vascular territory. 3. Scattered small-volume subarachnoid hemorrhage along the left cerebral hemisphere. 4. Regional mass effect with effacement of the left lateral ventricle temporal horn. No midline shift.  4/28 Post Thrombectomy MR Brain:  1. Restricted diffusion throughout the majority of the left MCA and ACA vascular territories consistent with acute ischemia. 2. No significant mass effect at this time. No midline shift. 3. The acute parenchymal hemorrhage and/or contrast staining centered within left basal ganglia and inferomedial left temporal lobe on prior head CT does not appear significantly changed. Based on the MR appearance, it is suspected that a significant component of this previously demonstrated hyperdensity reflects contrast staining. 4. Redemonstrated small volume subarachnoid hemorrhage overlying the left cerebral hemisphere. Small volume subarachnoid hemorrhage is also questioned along the right cerebral hemisphere posteriorly.  4/29 Post Hemicraniectomy CT head:  Complete left ACA/MCA territory infarction with swollen brain bulging through craniectomy defect. No midline shift or entrapment. Petechial hemorrhage at left basal ganglia. Extraaxial hemorrhage along surface of the infarct.    5/1 Head CT > Slightly increased rightward midline shift with a small amount of herniation beneath the anterior falx. Otherwise unchanged Examination.  5/6 Head CT: Left ACA and MCA territory infarct with unchanged degree of subarachnoid hemorrhage. Swelling has progressed with greater herniation through the craniectomy defect and 5 mm of midline shift.  5/8: Bilateral acute DVT involving the right common femoral vein right proximal profundal vein and right popliteal vein also left DVT involving same vessels  5/12 Renal US: Technically challenging exam due to patient's intubated status and difficulty with repositioning.  Diffusely increased cortical echogenicity in both kidneys compatible with medical renal disease.  Increased echogenicity of the liver with diminished through transmission, most often reflective of hepatic steatosis.  5/20 Ct H> significant motion artifact. Continued low density and swelling of L hemisphere in L ACA and MCA idstributions. Areas of petechial hemorrhage, no large hematoma. L craniectomy w bulging of brain through defect. Reduced mass effect with L to R shift now 25mm from 30mm previously   Micro Data:  4/28: SARS-CoV-2/Influenza A/Influenza B PCR: Negative  4/30: Respiratory culture: Abundant Moraxella Catarrhalis, Haemophilus Influenzae, beta lactamase positive  5/2: Urine culture: NGTD  5/2: BC x2: NGTD 5/7 resp >> H flu 5/7 bld >> negative 5/11 BCx2 >> negative 5/11 trach asp >> E coli 6/19 trach asp >> H. Influ, staph a.   Antimicrobials:  4/30 - 5/2: Cefepime 5/2 - 5/4: Augmentin  5/5 - 5/6 unasyn   Vanc 5/11 >> 5/13, 6/19>> Cefepime 5/11 >> 5/13, 6/19>> Ancef 5/14 >> 5/17 Metronidazole 6/19>>  Interim history/subjective:   Afebrile decreased secretions  appears stabilized.  Objective   Blood pressure 112/74, pulse 95, temperature 98.9 F (37.2 C), temperature source Oral, resp. rate (!) 31, height 6' (1.829 m), weight 99.8 kg, SpO2 95  %.    FiO2 (%):  [21 %] 21 %   Intake/Output Summary (Last 24 hours) at 08/26/2019 1153 Last data filed at 08/26/2019 1039 Gross per 24 hour  Intake --  Output 501 ml  Net -501 ml   Filed Weights   08/24/19 0500 08/25/19 0443 08/26/19 0600  Weight: 96.5 kg 96.3 kg 99.8 kg    Examination: 49 year old male somewhat more responsive but does not really follow commands opens his eyes to stimulus cranial flap is unremarkable Janina Mayo is in place.  No sputum at this time.  Satting adequately on current FiO2 Heart sounds are regular regular rate rhythm sinus rhythm PEG in place Extremities warm and dry  Resolved Hospital Problem list   treated HCAP with Moraxella and Haemophilus influenza, these were beta-lactamase positive, antibiotics were completed 5/6; COPD exacerbation. Ileus, AKI, hypernatremia Completed E. coli HCAP treatment on 5/18  Assessment & Plan:   Acute hypoxic respiratory failure with compromised airway. Failure to wean s/p tracheostomy. Hx of smoking with presumed COPD. Large Lt ICA/MCA CVA s/p thrombectomy and decompressive hemicraniectomy 2nd to cerebral edema and hemorrhage. Mild hypernatremia-->improved w/ Free water replacement  B/l lower extremity DVT.- s/p IVC filter 5/09, now on [facility] SVT.    Plan Ever spike 6/22 resolved, breathing better, cxr unremarkable Trach care Abx Change trach in future PCCM follow weekly      Best practice:  Diet: EN  DVT prophylaxis:  IVC filter 5/9, anticoag w Eliquis GI prophylaxis: Protonix Mobility: PT/ OT Code Status: FULL Disposition: neuro floor > ? To rehab or SNF   Brett Canales Minor ACNP Acute Care Nurse Practitioner [facility] Please consult Amion 08/26/2019, 11:53 AM    PCCM:  He looks better today. Less septic. He still has secretions. Janina Mayo looks good. Continue abx and de-escalate pending sensitivites. Continue routine trach care. CXR from yesterday reviewed. No infiltrate. PCCM consult  service will follow for trach management   Josephine Igo, DO [facility] 08/26/2019 3:14 PM

## 2019-08-26 NOTE — Progress Notes (Signed)
Pt temp: 103.2 HR: sustaining greater than 115. MD Dahal made aware of pt vital signs.  PRN Ibuprofen and IV metoprolol given. Nurse will continue to monitor.

## 2019-08-26 NOTE — Progress Notes (Addendum)
STROKE PROGRESS NOTE INTERVAL HISTORY  Unfortunately, patient continues to be febrile 100.7.  He is on antibiotics but has  no clear source of infection.  Hospitalist is helping with mana ID team feels he may have CNS infection and requesting MRI scan of the brain ordered tapping CSF.    No neurological changes.  Vital signs stable.  CBC is normal.  BMP is also normal today. Vitals:   08/26/19 1343 08/26/19 1515 08/26/19 1640 08/26/19 1730  BP:  113/75  (!) 153/85  Pulse:  99  (!) 126  Resp:  (!) 30  (!) 31  Temp: (!) 100.7 F (38.2 C) 99.7 F (37.6 C) 100.2 F (37.9 C) (!) 100.6 F (38.1 C)  TempSrc: Axillary Axillary  Oral  SpO2:  99%  97%  Weight:      Height:       CBC:  Recent Labs  Lab 08/25/19 0500 08/26/19 0405  WBC 8.7 8.9  NEUTROABS 6.1 6.3  HGB 11.9* 11.7*  HCT 38.0* 36.9*  MCV 97.4 96.9  PLT 205 232   Basic Metabolic Panel:  Recent Labs  Lab 08/24/19 0440 08/24/19 0440 08/25/19 0500 08/26/19 0405  NA 138   < > 141 138  K 3.6   < > 3.4* 4.0  CL 107   < > 108 105  CO2 22   < > 28 25  GLUCOSE 106*   < > 93 103*  BUN 13   < > 14 14  CREATININE 0.65   < > 0.72 0.72  CALCIUM 8.7*   < > 8.8* 8.9  MG 1.9  --   --   --   PHOS 2.6  --   --   --    < > = values in this interval not displayed.    IMAGING past 24 hours No results found.  PHYSICAL EXAM  General- middle-aged Caucasian male, s/p tracheostomy on trach collar, sleeping but arousable.   Ophthalmologic- fundi not visualized due to noncooperation.  Cardiovascular - Regular rate and rhythm.  Neuro - s/p tracheostomy on trach collar.  Eyes open but the patient is mute.  Currently febrile 103 and shivering.  Global aphasia, left gaze preference position, barely cross midline, notblinking to visual threat on the left, PERRL. Right facial droop. Tongue protrusion not cooperative. LUE spontaneous movement at least 4/5, LLE against gravity at least 3/5, right UE and LE flaccid still and does not  seem to be developing rigidity or spasticity at this time, slight 1/5 movement with pain at RLE no withdrawal to pain in upper. Sensation, coordination and gait not tested. Not cooperative this morning.    ASSESSMENT/PLAN Lee Hooper is a 49 y.o. male with history of COPD, ongoing tobacco use, and psoriasis, presenting with dense LMCA syndrome. Attempt for Left ICA and Left M1 thrombectomy was done emergently with TICI 2b flow. Unfortunately, the pt had post procedure hemorrhage and reocclusion of the vessels. F/u imaging suggested early edema and pt underwent prophylactic decompressive hemicraniectomy.  Patient was enrolled in the CHARM trial for cytotoxic edema but was a screen failure due to increase core size greater than 300 mL on DWI  Stroke: 07/01/19 - Large LMCA and ACA stroke d/t terminal left ICA occlusions s/p EVT but with reocclusion and malignant cerebral edema s/p left depressive hemicraniectomy on 07/01/19 Dr Lee Hooper. Stroke etiology unknown - given DVT requiring eventual AC and high mRS, will not do further embolic workup  Eliquis for VTE prophylaxis  No  antithrombotic prior to admission, now on Eliquis.  Therapy recommendations:  CIR   Disposition:  Pending  Left depressive hemicraniectomy on 07/01/19 Dr Lee Hooper  Dr. Christella Hooper  for cranioplasty, earliest next week.   Respiratory failure s/p Trach Trach wound  Downsized to cuffless trach 6 by CCM 5/24  Fever Tachycardia  Leukocytosis (believed aspiration pneumonia ->sepsis)  Temp - 103.2->102.9->102.2->100  Tachycardia - 127->133->130->86-> (metoprolol given by nursing per standing order) ->112  Known DVT - possible PE? - Chest CTA considered but pt already on Eliquis  IVC filter placed 07/12/19   Eliquis restarted 07/26/19 (5 mg Bid)  ABGs 08/22/19 - WNL except PO2 - 69.3  (CMP - unremarkable)  ;  (CBC - WBCs 13.4->16.0)  ;  (UA - cloudy with rare bacteria)  CXR - 6/19 - Minimal bibasilar opacification  which may be due to atelectasis or infection.  Blood cultures -  No growth < 24 hrs  Lactic Acid - 2.2->3.3->1.7  Procalcitonin < 0.10  Respiratory cultures - pending  Dr Lee Hooper consulting  Maxipime, Metronidazole and Vancomycin started 08/22/19 for Sepsis   Labs in AM   Appreciate Hospitalists' assistance  Hyperglycemia  On TF  Added SSI  Bilateral Lower Ext DVT, extensive Possible PE given tachycardia  Venous US 07/11/2019 DVT + bilateral extensive  IVC filter placed 5/9  On eliquis now for DVT treatment and prophylaxis  Dysphagia   PEG placement 5/14  FW 150 Q4h   On bolus TF -> cut in half to facilitate po intake   MBSS cleared for D1 nectar thick liquids  SLP following for tolerance  Tobacco abuse  Current smoker  Smoking cessation counseling will be provided if able  Other Stroke Risk Factors  Obesity, Body mass index is 29.84 kg/m., recommend weight loss, diet and exercise as appropriate    Likely undiagnosed obstructive sleep apnea based on body habitus  Other Active Problems  Na - [facility] day # 56  Continue antibiotics as per medical hospitalist and ID team recommendations   Discussed with ID team nurse practitioner and Dr. Pietro Hooper.  Plan MRI scan of the brain with and without contrast to look for ventriculitis brain abscess though doubt this since neurological exam has not changed.  Greater than 50% time during this 25-minute visit was spent on counseling and coordination of care and discussion with care team.  Discussed with ID team and patient's son at the bedside and answered questions  Lee Contras, MD   To contact Stroke Continuity provider, please refer to [email or web address] After hours, contact General Neurology

## 2019-08-26 NOTE — Progress Notes (Signed)
Pharmacy Antibiotic Note  Lee Hooper is a 49 y.o. male admitted on 07/01/2019 with CVA. Pharmacy has been consulted for vancomycin + cefepime dosing with concerns for developing sepsis. Scr stable.  Pt has been on vanc/cefepime for his sepsis with the possibility of CSF infection per ID. Vanc trough came back tonight at 10 after the dose increase. We will change it to q8 dosing.   Plan: -Continue Cefepime 2g IV q8h -Change vanc to 1250 mg q8 -Follow Cr, LOT, cultures -Vancomycin trough again as needed   Height: 6' (182.9 cm) Weight: 99.8 kg (220 lb 0.3 oz) IBW/kg (Calculated) : 77.6  Temp (24hrs), Avg:100.4 F (38 C), Min:98.8 F (37.1 C), Max:103.2 F (39.6 C)  Recent Labs  Lab 08/22/19 1255 08/22/19 1537 08/22/19 1915 08/22/19 2330 08/24/19 0440 08/24/19 1511 08/25/19 0500 08/26/19 0405 08/26/19 1530  WBC 13.4*  --  16.0*  --  12.5*  --  8.7 8.9  --   CREATININE 0.84  --  0.87  --  0.65  --  0.72 0.72  --   LATICACIDVEN  --  2.2* 3.3* 1.7  --   --   --   --   --   VANCOTROUGH  --   --   --   --   --  9*  --   --  10*    Estimated Creatinine Clearance: 135.2 mL/min (by C-G formula based on SCr of 0.72 mg/dL).    Allergies  Allergen Reactions  . Tegaderm Ag Mesh [Silver] Other (See Comments)    blisters    Antimicrobials this admission: Cefepime 4/30 >>5/2; 6/19 >>    Augmentin 5/2>>5/5 Unasyn 5/5 >> 5/6 Cefepime 5/11 >> 5/14, 5/24>5/30 Vancomycin 5/11 >> 5/14, 5/24>5/27; 6/19 >> Cefazolin 5/14 >> 5/17 Cefazolin 2 gm x 1 pre-op on 6/2  Microbiology results: 4/28 MRSA neg 4/30 TA: m cat, h. Influenzae - beta lactamase + 5/2 UCx - negative 5/2 BCx - negative 5/7 Bcx - negative 5/7 TA - H.influenza + Moraxella 5/11 BCx - negative 5/11TA - E.coli (S Ancef, Cipro, Zosyn) 5/25 BCx: negative 6/2 Wound culture: normal flora   Lee Hooper, PharmD, BCIDP, AAHIVP, CPP Infectious Disease Pharmacist 08/26/2019 5:00 PM

## 2019-08-26 NOTE — Progress Notes (Signed)
[facility]  Date of Admission:  07/01/2019      Total days of antibiotics 5             ASSESSMENT: Lee Hooper is a 49 y.o. male with ongoing high fevers despite antibiotics. CXR does not reveal concern for pneumonia and given recent tracheal aspirate cultures I would have suspected improvement on current regimen making tracheitis unlikely. Overall his cranial incision is healed aside from a small scabbed area - not sure wound culture will be successful or helpful.   He has rigors on exam likely due to impending fever - discussed with his nurse to continue treating fever and symptoms.  Reportedly no change in his neurologic status however since then. He does not necessarily interact much with me but Dr. Leonie Man and his nurse have not witness any changes.   The degree of swelling in cranium is concerning for infection - discussed with Dr. Leonie Man and Dr. Pietro Cassis - will await Dr. Lacy Duverney review with concern re: CNS infection vs neurologic fevers.  Doubtful with the degree of swelling he would be a safe candidate for LP.     PLAN: 1. Follow fever curve - treat with antipyretics  2. Continue vancomycin + cefepime  3. Follow for Dr. 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)   Sepsis ([place])   . apixaban  5 mg Per Tube BID  . chlorhexidine gluconate (MEDLINE KIT)  15 mL Mouth Rinse BID  . Chlorhexidine Gluconate Cloth  6 each Topical Daily  . feeding supplement (OSMOLITE 1.5 CAL)  474 mL  Per Tube TID  . feeding supplement (PRO-STAT SUGAR FREE 64)  30 mL Per Tube TID  . free water  150 mL Per Tube Q4H  . insulin aspart  0-9 Units Subcutaneous TID WC  . mouth rinse  15 mL Mouth Rinse q12n4p  . metoprolol tartrate  37.5 mg Per Tube BID  . multivitamin  15 mL Per Tube Daily  . mupirocin ointment   Nasal BID  . nutrition supplement (JUVEN)  1 packet Per Tube BID BM  . pantoprazole sodium  40 mg Per Tube Daily  . sodium chloride flush  10-40 mL Intracatheter Q12H    SUBJECTIVE: He is shaking visibly at the time of my visit. Non-verbal.    Review of Systems: Review of Systems  Unable to perform ROS: Patient nonverbal    Allergies  Allergen Reactions  . Tegaderm Ag Mesh [Silver] Other (See Comments)    blisters    OBJECTIVE: Vitals:   08/26/19 1155 08/26/19 1209 08/26/19 1343 08/26/19 1515  BP: 121/83 121/83  113/75  Pulse: 100 (!) 101  99  Resp: (!) 22 (!) 22  (!) 30  Temp:   (!) 100.7 F (38.2 C) 99.7 F (37.6 C)  TempSrc:   Axillary Axillary  SpO2: 97% 97%  99%  Weight:      Height:       Body mass index is 29.84 kg/m.  Physical  Exam Vitals and nursing note reviewed.  Constitutional:      Appearance: He is ill-appearing.  Neck:     Comments: Trach in place, clean and dry. On trach collar  Cardiovascular:     Rate and Rhythm: Regular rhythm. Tachycardia present.     Heart sounds: No murmur heard.   Pulmonary:     Effort: Pulmonary effort is normal.     Breath sounds: Normal breath sounds.     Comments: Cough is dry and infrequent Abdominal:     General: Bowel sounds are normal. There is no distension.     Palpations: Abdomen is soft.  Musculoskeletal:     Cervical back: Neck supple. No rigidity.     Comments: R sided flaccid   Skin:    General: Skin is warm and dry.     Capillary Refill: Capillary refill takes less than 2 seconds.  Neurological:     Mental Status: He is alert.     Lab Results Lab Results  Component Value Date    WBC 8.9 08/26/2019   HGB 11.7 (L) 08/26/2019   HCT 36.9 (L) 08/26/2019   MCV 96.9 08/26/2019   PLT 232 08/26/2019    Lab Results  Component Value Date   CREATININE 0.72 08/26/2019   BUN 14 08/26/2019   NA 138 08/26/2019   K 4.0 08/26/2019   CL 105 08/26/2019   CO2 25 08/26/2019    Lab Results  Component Value Date   ALT 41 08/24/2019   AST 24 08/24/2019   ALKPHOS 54 08/24/2019   BILITOT 0.7 08/24/2019     Microbiology: Recent Results (from the past 240 hour(s))  Culture, respiratory     Status: None   Collection Time: 08/22/19  3:11 PM   Specimen: Tracheal Aspirate  Result Value Ref Range Status   Specimen Description TRACHEAL ASPIRATE  Final   Special Requests NONE  Final   Gram Stain   Final    FEW WBC PRESENT, PREDOMINANTLY PMN FEW GRAM NEGATIVE RODS Performed at [facility], [address] [place], [place] [address]    Culture   Final    MODERATE HAEMOPHILUS INFLUENZAE BETA LACTAMASE POSITIVE FEW STAPHYLOCOCCUS AUREUS    Report Status 08/26/2019 FINAL  Final   Organism ID, Bacteria STAPHYLOCOCCUS AUREUS  Final      Susceptibility   Staphylococcus aureus - MIC*    CIPROFLOXACIN <=0.5 SENSITIVE Sensitive     ERYTHROMYCIN RESISTANT Resistant     GENTAMICIN <=0.5 SENSITIVE Sensitive     OXACILLIN <=0.25 SENSITIVE Sensitive     TETRACYCLINE <=1 SENSITIVE Sensitive     VANCOMYCIN 1 SENSITIVE Sensitive     TRIMETH/SULFA <=10 SENSITIVE Sensitive     CLINDAMYCIN RESISTANT Resistant     RIFAMPIN <=0.5 SENSITIVE Sensitive     Inducible Clindamycin POSITIVE Resistant     * FEW STAPHYLOCOCCUS AUREUS  Culture, blood (x 2)     Status: None (Preliminary result)   Collection Time: 08/22/19  3:32 PM   Specimen: BLOOD  Result Value Ref Range Status   Specimen Description BLOOD LEFT ANTECUBITAL  Final   Special Requests   Final    BOTTLES DRAWN AEROBIC AND ANAEROBIC Blood Culture results may not be optimal due to an excessive volume of blood received in culture  bottles   Culture   Final    NO GROWTH 4 DAYS Performed at [facility], [address] [place], [place] [address]    Report Status PENDING  Incomplete  Culture, blood (x 2)     Status: None (Preliminary result)   Collection Time: 08/22/19  3:37 PM   Specimen: BLOOD  Result Value Ref Range Status   Specimen Description BLOOD LEFT ANTECUBITAL  Final   Special Requests   Final    AEROBIC BOTTLE ONLY Blood Culture results may not be optimal due to an excessive volume of blood received in culture bottles   Culture   Final    NO GROWTH 4 DAYS Performed at [facility], [place] [address], [place], [place] [address]    Report Status PENDING  Incomplete  MRSA PCR Screening     Status: None   Collection Time: 08/24/19  4:35 PM   Specimen: Nasal Mucosa; Nasopharyngeal  Result Value Ref Range Status   MRSA by PCR NEGATIVE NEGATIVE Final    Comment:        The GeneXpert MRSA Assay (FDA approved for NASAL specimens only), is one component of a comprehensive MRSA colonization surveillance program. It is not intended to diagnose MRSA infection nor to guide or monitor treatment for MRSA infections. Performed at [facility], [place] [address] [place], [place] [address]      Janene Madeira, MSN, NP-C [facility] [email or web address] Pager: [phone] Office: [phone] [place]: [phone]

## 2019-08-26 NOTE — Progress Notes (Signed)
Pt noticed to have tremors/shivering  And O2 sats at 85%.  RN suctioned pt and noticed to thick tan secretions. Sats then dropped to 82%. Pt temp: 99.4 No LOC.RT paged. MD D[place]al notified. Rapid response aware.

## 2019-08-26 NOTE — Progress Notes (Signed)
  Speech Language Pathology Treatment: [place] Speaking valve;Dysphagia  Patient Details Name: Lee Hooper MRN: [id number] DOB: [birth year] Today's Date: 08/26/2019 Time: 1017-5102 SLP Time Calculation (min) (ACUTE ONLY): 15 min  Assessment / Plan / Recommendation Clinical Impression  Pt has had fever, was made NPO several days ago due to concerns for potential aspiration; ID has been consulted - suspect CNS infection.  Pt's son present for session today. Pt's HOB was elevated and mouth was cleaned. PMV was placed - frequent removal at intervals of 5-7 cycles revealed no concerns for air trapping.  VS remained stable throughout session.  Pt was alert, focused attention briefly. There was no spontaneous voicing observed despite max cues.  Limited teaspoon trials of puree and honey-thick liquid were offered (three 1/2 teaspoons).  Pt accepted spoon and manipulated material; swallow was initiated with no overt s/s aspiration. Pt turned head away and facial expression indicated distaste for material.  Trials were ceased.    SLP will continue efforts to facilitate communication and swallowing as appropriate. Recommend maintaining NPO status for now; trials with SLP only.   HPI HPI: This 49 y.o. male admitted 4/28 with Rt sided hemiplegia and aphasia.  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  Repeat CT following procedure showed moderate basal ganglia hemorrhage vs. stroke   Repeat MRI revealed that the ICA and MCA re-occluded with change in FLAIR sequence excluding him from repeat thrombectomy.  He underwent a decompressive hemicraniectomy.  He waws intubated 4/28.  He developed an ileus 5/2. Trach 5/8. IVC filter placed 5/9.  Peg placement.  PMH includes: COPD, tobacco abuse, psoriasis      SLP Plan  Continue with current plan of care       Recommendations  Diet recommendations: NPO      Patient may use Passy-Muir Speech Valve: During all waking hours (remove during  sleep);During all therapies with supervision PMSV Supervision: Full         Oral Care Recommendations: Oral care QID Follow up Recommendations: Skilled Nursing facility SLP Visit Diagnosis: Dysphagia, oropharyngeal phase (R13.12) Plan: Continue with current plan of care       GO                Lee Hooper 08/26/2019, 12:26 PM  Lee Hooper Lee Hooper Frederic, MA CCC/SLP Acute Rehabilitation Services Office number [phone] Pager [phone]

## 2019-08-26 NOTE — Significant Event (Signed)
Rapid Response Event Note  Called to bedside for significant shaking.  RT has suctioned patient prior to my arrival.  Upon my arrival he has mild increased WOB, he is not shivering at this time.  Temp currently 100.7 earlier this am temp was 103. BP 108/84  HR 126  RR 34  O2 sats 97% on TC He is arousable Respiratory status improving. ABG done  Recommended Cooling blanket for temperature control Per RN MD reevaluating antibiotics  RN to call if assistance needed  Marcellina Millin

## 2019-08-26 NOTE — Progress Notes (Signed)
Occupational Therapy Treatment Patient Details Name: Lee Hooper MRN: [id number] DOB: 1970-12-18 Today's Date: 08/26/2019    History of present illness This 49 y.o. male admitted 4/28 with Rt sided hemiplegia and aphasia.  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  Repeat CT following procedure showed moderate basal ganglia hemorrhage vs. stroke   Repeat MRI revealed that the ICA and MCA re-occluded with change in FLAIR sequence excluding him from repeat thrombectomy.  He underwent a decompressive hemicraniectomy.  He was intubated 4/28, trach 5/8; liberated from ventilator 5/14.  He developed an ileus 5/2.  5/9 IVC filter placed due to bil LE DVTs (extensive). PEG 5/14. PMH includes: COPD, tobacco abuse, psoriasis   OT comments  Pt seen in conjunction with PT to maximize participation and activity tolerance. Pt lethargic upon arrival but arouses to max stimulation. Pt currently requires total A +2 for all bed mobility. Pt able to sit EOB with min guard with LUE supported on rail, brief periods with pt able to sit with BUE in lap with close min guard. Pt currently requires min guard for UB ADLs and total A for LB ADls. Agree with DC plan below, will follow.   Follow Up Recommendations  CIR    Equipment Recommendations  Other (comment) (defer to next venue of care)    Recommendations for Other Services      Precautions / Restrictions Precautions Precautions: Fall Precaution Comments: R hemi, trach (has PMSV), PEG, crani-No Bone flap L head; rt shoulder pain/subluxation; bil LE DVTs with IVC filter Restrictions Weight Bearing Restrictions: No       Mobility Bed Mobility Overal bed mobility: Needs Assistance Bed Mobility: Sit to Sidelying;Supine to Sit     Supine to sit: +2 for physical assistance;HOB elevated;Total assist   Sit to sidelying: Total assist;+2 for physical assistance General bed mobility comments: total A +2 for all aspects of bed mobility with pt  exiting to R side of bed. scooted pts hip backwards and diagonolly to avoid coming down on R subluxed extremity when returning to supine  Transfers                 General transfer comment: unable to tranfser this session secondary to safety    Balance Overall balance assessment: Needs assistance Sitting-balance support: Feet supported;Single extremity supported Sitting balance-Leahy Scale: Poor Sitting balance - Comments: pt able to maintain sitting balance EOB holding onto rail with L UE with min guard assist for safety, pt was able to sit with BUE in lap for briefly periods with close min guard. Postural control: Right lateral lean                                 ADL either performed or assessed with clinical judgement   ADL Overall ADL's : Needs assistance/impaired     Grooming: Wash/dry face;Sitting;Min guard Grooming Details (indicate cue type and reason): min guard for sitting balance and MAX cues to initiate             Lower Body Dressing: Total assistance;Bed level Lower Body Dressing Details (indicate cue type and reason): to don socks   Toilet Transfer Details (indicate cue type and reason): unable to transfer this session secondary to fatigue and resistance         Functional mobility during ADLs: Total assistance;+2 for safety/equipment;+2 for physical assistance (bed mobility only) General ADL Comments: pt with increased  fatigue this session asleep upon arrival needing MAX multimodal cues to arouse.     Vision       Perception     Praxis      Cognition Arousal/Alertness: Awake/alert;Lethargic (period of wakefulness and lethargy) Behavior During Therapy: Flat affect Overall Cognitive Status: Impaired/Different from baseline Area of Impairment: Attention;Following commands;Problem solving;Awareness                   Current Attention Level: Sustained   Following Commands: Follows one step commands with increased  time;Follows one step commands inconsistently   Awareness: Intellectual Problem Solving: Slow processing;Requires verbal cues;Requires tactile cues;Decreased initiation;Difficulty sequencing General Comments: pt more flat this session, asleep upon arrival but arouses with stimulation. Pt initially resistant to movement        Exercises     Shoulder Instructions       General Comments      Pertinent Vitals/ Pain       Pain Assessment: Faces Faces Pain Scale: No hurt  Home Living                                          Prior Functioning/Environment              Frequency  Min 2X/week        Progress Toward Goals  OT Goals(current goals can now be found in the care plan section)  Progress towards OT goals: Progressing toward goals  Acute Rehab OT Goals Patient Stated Goal: nods head "yes" to sit at EOB OT Goal Formulation: Patient unable to participate in goal setting Time For Goal Achievement: 09/02/19 (OTR updated goals 6/16) Potential to Achieve Goals: Fair  Plan Discharge plan remains appropriate;Frequency remains appropriate    Co-evaluation      Reason for Co-Treatment: For patient/therapist safety;To address functional/ADL transfers;Necessary to address cognition/behavior during functional activity;Complexity of the patient's impairments (multi-system involvement)   OT goals addressed during session: ADL's and self-care      AM-PAC OT "6 Clicks" Daily Activity     Outcome Measure   Help from another person eating meals?: Total Help from another person taking care of personal grooming?: A Lot Help from another person toileting, which includes using toliet, bedpan, or urinal?: Total Help from another person bathing (including washing, rinsing, drying)?: Total Help from another person to put on and taking off regular upper body clothing?: Total Help from another person to put on and taking off regular lower body clothing?: Total 6  Click Score: 7    End of Session Equipment Utilized During Treatment: Oxygen;Other (comment) (28% FiO2 5LPM)  OT Visit Diagnosis: Unsteadiness on feet (R26.81);Cognitive communication deficit (R41.841);Hemiplegia and hemiparesis;Muscle weakness (generalized) (M62.81);Apraxia (R48.2);Other symptoms and signs involving cognitive function;Pain Symptoms and signs involving cognitive functions: Cerebral infarction Hemiplegia - Right/Left: Right Hemiplegia - dominant/non-dominant: Dominant Hemiplegia - caused by: Cerebral infarction Pain - Right/Left: Right   Activity Tolerance Patient limited by lethargy;Patient limited by fatigue   Patient Left in bed;with call bell/phone within reach;with bed alarm set   Nurse Communication Mobility status        Time: 6440-3474 OT Time Calculation (min): 26 min  Charges: OT General Charges $OT Visit: 1 Visit OT Treatments $Therapeutic Activity: 8-22 mins  Audery Amel., COTA/L Acute Rehabilitation Services [phone]    Angelina Pih 08/26/2019, 4:01 PM

## 2019-08-27 ENCOUNTER — Inpatient Hospital Stay (HOSPITAL_COMMUNITY): Payer: Medicaid Other

## 2019-08-27 LAB — GLUCOSE, CAPILLARY
Glucose-Capillary: 97 mg/dL (ref 70–99)
Glucose-Capillary: 95 mg/dL (ref 70–99)
Glucose-Capillary: 146 mg/dL — ABNORMAL HIGH (ref 70–99)
Glucose-Capillary: 104 mg/dL — ABNORMAL HIGH (ref 70–99)
Glucose-Capillary: 101 mg/dL — ABNORMAL HIGH (ref 70–99)

## 2019-08-27 LAB — BODY FLUID CELL COUNT WITH DIFFERENTIAL
Eos, Fluid: 1 %
Lymphs, Fluid: 8 %
Monocyte-Macrophage-Serous Fluid: 0 % — ABNORMAL LOW (ref 50–90)
Neutrophil Count, Fluid: 90 % — ABNORMAL HIGH (ref 0–25)
Total Nucleated Cell Count, Fluid: 2715 cu mm — ABNORMAL HIGH (ref 0–1000)

## 2019-08-27 LAB — CULTURE, BLOOD (ROUTINE X 2)
Culture: NO GROWTH
Culture: NO GROWTH

## 2019-08-27 MED ORDER — GADOBUTROL 1 MMOL/ML IV SOLN
10.0000 mL | Freq: Once | INTRAVENOUS | Status: AC | PRN
  Administered 2019-08-27: 10 mL via INTRAVENOUS

## 2019-08-27 NOTE — Progress Notes (Signed)
.  BP 101/75 (BP Location: Right Arm)   Pulse 97   Temp 98.7 F (37.1 C) (Rectal)   Resp (!) 28   Ht 6' (1.829 m)   Wt 99.8 kg   SpO2 97%   BMI 29.84 kg/m  Alert, follows commands Subgaleal fluid collection aspirated and sent to lab

## 2019-08-27 NOTE — Progress Notes (Signed)
Nutrition Follow-up  DOCUMENTATION CODES:   Obesity unspecified  INTERVENTION:  Continuebolus feeding via PEG: -2 cans Osmolite 1.5 cal (433m) TID  -326mPro-stat TID -15061mree water Q4H  Tube feeding regimen will provide 2430 kcals, 134 grams of protein, 1086m55mee water (1986ml33mal free water with flushes)   Continue1 packet Juven BIDvia PEG, each packet provides 95 calories, 2.5 grams of protein (collagen), and 9.8 grams of carbohydrate (3 grams sugar); also contains 7 grams of L-arginine and L-glutamine, 300 mg vitamin C, 15 mg vitamin E, 1.2 mcg vitamin B-12, 9.5 mg zinc, 200 mg calcium, and 1.5 g Calcium Beta-hydroxy-Beta-methylbutyrate to support wound healing    NUTRITION DIAGNOSIS:   Inadequate oral intake related to inability to eat as evidenced by NPO status.  Ongoing.  GOAL:   Patient will meet greater than or equal to 90% of their needs  Met with TF.   MONITOR:   TF tolerance  REASON FOR ASSESSMENT:   Consult, Ventilator Enteral/tube feeding initiation and management  ASSESSMENT:   Pt with PMH of smoking and COPD admitted with L ICA/MCA s/p IR for revascularization however pt had re-occlusion now s/p emergent L hemicraniectomy.  4/30 cortrak placed; tip gastric 5/2 TF held due to ileus 5/6 trickle TF started 5/8 trach placed; IVC filter placed 5/14 PEG placed 6/2 s/p scalp wound revision 6/17 pt started on dysphagia 1 diet, TF reduced by MD 6/19 pt febrile, CXR with possible aspiration, sepsis workup started, pt NPO again  Pt now with recurrent fevers. Per ID team, pt may have CNS infection.   TF: 474ml 45mlite 1.5 cal TID, 30ml P48mat TID, 150ml fr85mater Q4h  Labs: CBGs 97-95-146 Medications: Novolog, MVI, Juven, Protonix  Diet Order:   Diet Order            Diet NPO time specified  Diet effective now                 EDUCATION NEEDS:   No education needs have been identified at this time  Skin:  Skin  Assessment: Skin Integrity Issues: Skin Integrity Issues:: Stage III, Incisions Stage III: R neck Incisions: head x2, R neck  Last BM:  6/23 type 6  Height:   Ht Readings from Last 1 Encounters:  07/22/19 6' (1.829 m)    Weight:   Wt Readings from Last 1 Encounters:  08/26/19 99.8 kg    Ideal Body Weight:  80.9 kg  BMI:  Body mass index is 29.84 kg/m.  Estimated Nutritional Needs:   Kcal:  [id number]:  125-145 grams  Fluid:  2 L/day    Lindyn Vossler ALarkin Ina, LDN RD pager number and weekend/on-call pager number located in [email or web address]

## 2019-08-27 NOTE — Progress Notes (Signed)
Pt went to MRI.

## 2019-08-27 NOTE — Progress Notes (Signed)
[facility] for Infectious Disease  Date of Admission:  07/01/2019      Total days of antibiotics 6  Vanc + Cefepime + Metronidazole              ASSESSMENT: ILYAAS Hooper is a 49 y.o. male with ongoing high fevers despite antibiotics. MRI of the brain with and without reveals a cranial fluid collection concerning for infection. Still requiring interventions for high fevers. His nurse today who had him prior to fevers states he has had a change and was previously able to feed himself.  Neurosurgery planning to aspirate fluid - would send for routine culture as well as fungal, AFB if possible.     PLAN: 1. Continue vancomycin + cefepime  2. 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 apixaban  5 mg Per Tube BID  . chlorhexidine gluconate (MEDLINE KIT)  15 mL Mouth Rinse BID  . Chlorhexidine Gluconate Cloth  6 each Topical Daily  . feeding supplement (OSMOLITE 1.5 CAL)  474 mL Per Tube TID  . feeding supplement (PRO-STAT SUGAR FREE 64)  30 mL Per Tube TID  . free water  150 mL Per Tube Q4H  . insulin aspart  0-9 Units Subcutaneous TID WC  . mouth rinse  15 mL Mouth Rinse q12n4p  . metoprolol tartrate  37.5 mg Per Tube BID  . multivitamin  15 mL Per Tube Daily  . mupirocin ointment   Nasal BID  . nutrition supplement (JUVEN)  1 packet Per Tube BID BM  . pantoprazole sodium  40 mg Per Tube Daily  . sodium chloride flush  10-40 mL  Intracatheter Q12H    SUBJECTIVE: Not verbal.  Did not wake up for me.    Review of Systems: Review of Systems  Unable to perform ROS: Patient nonverbal    Allergies  Allergen Reactions  . Tegaderm Ag Mesh [Silver] Other (See Comments)    blisters    OBJECTIVE: Vitals:   08/27/19 1103 08/27/19 1104 08/27/19 1127 08/27/19 1312  BP: 103/65  103/65   Pulse: (!) 108  (!) 109   Resp: (!) 26  (!) 30   Temp: 99.2 F (37.3 C) 99.8 F (37.7 C)  100.2 F (37.9 C)  TempSrc: Rectal Axillary  Rectal  SpO2: 95%  95%   Weight:      Height:       Body mass index is 29.84 kg/m.  Physical Exam Vitals and nursing note reviewed.  Constitutional:      Appearance: He is ill-appearing.  Neck:     Comments: Trach in place, clean and dry. On trach collar  Cardiovascular:     Rate and Rhythm: Regular rhythm. Tachycardia present.     Heart sounds: No murmur heard.   Pulmonary:     Effort: Pulmonary effort is normal.     Breath sounds: Normal breath sounds.     Comments: Cough is dry  and infrequent Abdominal:     General: Bowel sounds are normal. There is no distension.     Palpations: Abdomen is soft.  Musculoskeletal:     Cervical back: Neck supple. No rigidity.     Comments: R sided flaccid   Skin:    General: Skin is warm and dry.     Capillary Refill: Capillary refill takes less than 2 seconds.  Neurological:     Mental Status: He is alert.     Lab Results Lab Results  Component Value Date   WBC 8.9 08/26/2019   HGB 11.7 (L) 08/26/2019   HCT 36.9 (L) 08/26/2019   MCV 96.9 08/26/2019   PLT 232 08/26/2019    Lab Results  Component Value Date   CREATININE 0.72 08/26/2019   BUN 14 08/26/2019   NA 138 08/26/2019   K 4.0 08/26/2019   CL 105 08/26/2019   CO2 25 08/26/2019    Lab Results  Component Value Date   ALT 41 08/24/2019   AST 24 08/24/2019   ALKPHOS 54 08/24/2019   BILITOT 0.7 08/24/2019     Microbiology: Recent Results (from the past 240 hour(s))   Culture, respiratory     Status: None   Collection Time: 08/22/19  3:11 PM   Specimen: Tracheal Aspirate  Result Value Ref Range Status   Specimen Description TRACHEAL ASPIRATE  Final   Special Requests NONE  Final   Gram Stain   Final    FEW WBC PRESENT, PREDOMINANTLY PMN FEW GRAM NEGATIVE RODS Performed at [facility], [address], [place], [place] [address]    Culture   Final    MODERATE HAEMOPHILUS INFLUENZAE BETA LACTAMASE POSITIVE FEW STAPHYLOCOCCUS AUREUS    Report Status 08/26/2019 FINAL  Final   Organism ID, Bacteria STAPHYLOCOCCUS AUREUS  Final      Susceptibility   Staphylococcus aureus - MIC*    CIPROFLOXACIN <=0.5 SENSITIVE Sensitive     ERYTHROMYCIN RESISTANT Resistant     GENTAMICIN <=0.5 SENSITIVE Sensitive     OXACILLIN <=0.25 SENSITIVE Sensitive     TETRACYCLINE <=1 SENSITIVE Sensitive     VANCOMYCIN 1 SENSITIVE Sensitive     TRIMETH/SULFA <=10 SENSITIVE Sensitive     CLINDAMYCIN RESISTANT Resistant     RIFAMPIN <=0.5 SENSITIVE Sensitive     Inducible Clindamycin POSITIVE Resistant     * FEW STAPHYLOCOCCUS AUREUS  Culture, blood (x 2)     Status: None   Collection Time: 08/22/19  3:32 PM   Specimen: BLOOD  Result Value Ref Range Status   Specimen Description BLOOD LEFT ANTECUBITAL  Final   Special Requests   Final    BOTTLES DRAWN AEROBIC AND ANAEROBIC Blood Culture results may not be optimal due to an excessive volume of blood received in culture bottles   Culture   Final    NO GROWTH 5 DAYS Performed at [facility], [place] [address] [place], [place] [address]    Report Status 08/27/2019 FINAL  Final  Culture, blood (x 2)     Status: None   Collection Time: 08/22/19  3:37 PM   Specimen: BLOOD  Result Value Ref Range Status   Specimen Description BLOOD LEFT ANTECUBITAL  Final   Special Requests   Final    AEROBIC BOTTLE ONLY Blood Culture results may not be optimal due to an excessive volume of blood received in culture bottles    Culture   Final    NO GROWTH 5 DAYS Performed at  [facility], [place] [address] [place], [place] [address]    Report Status 08/27/2019 FINAL  Final  MRSA PCR Screening     Status: None   Collection Time: 08/24/19  4:35 PM   Specimen: Nasal Mucosa; Nasopharyngeal  Result Value Ref Range Status   MRSA by PCR NEGATIVE NEGATIVE Final    Comment:        The GeneXpert MRSA Assay (FDA approved for NASAL specimens only), is one component of a comprehensive MRSA colonization surveillance program. It is not intended to diagnose MRSA infection nor to guide or monitor treatment for MRSA infections. Performed at [facility], [place] [address] [place], [place] [address]      Janene Madeira, MSN, NP-C [facility] Infectious Disease [email or web address] Pager: [phone] Office: [phone] [place]: [phone]

## 2019-08-27 NOTE — Plan of Care (Signed)
Pt has been alert. Tele on and HR stayed below 115. Trach on 5L and intact. MRI done. CHG bath given. Central line infusing and has blood returned. Updated Pt's wife this morning. Problem: Education: Goal: Knowledge of disease or condition will improve Outcome: Progressing Goal: Knowledge of secondary prevention will improve Outcome: Progressing Goal: Knowledge of patient specific risk factors addressed and post discharge goals established will improve Outcome: Progressing Goal: Individualized Educational Video(s) Outcome: Progressing   Problem: Coping: Goal: Will verbalize positive feelings about self Outcome: Progressing Goal: Will identify appropriate support needs Outcome: Progressing   Problem: Health Behavior/Discharge Planning: Goal: Ability to manage health-related needs will improve Outcome: Progressing   Problem: Self-Care: Goal: Ability to participate in self-care as condition permits will improve Outcome: Progressing Goal: Verbalization of feelings and concerns over difficulty with self-care will improve Outcome: Progressing Goal: Ability to communicate needs accurately will improve Outcome: Progressing   Problem: Nutrition: Goal: Risk of aspiration will decrease Outcome: Progressing Goal: Dietary intake will improve Outcome: Progressing   Problem: Ischemic Stroke/TIA Tissue Perfusion: Goal: Complications of ischemic stroke/TIA will be minimized Outcome: Progressing   Problem: Education: Goal: Knowledge of General Education information will improve Description: Including pain rating scale, medication(s)/side effects and non-pharmacologic comfort measures Outcome: Progressing   Problem: Health Behavior/Discharge Planning: Goal: Ability to manage health-related needs will improve Outcome: Progressing   Problem: Clinical Measurements: Goal: Ability to maintain clinical measurements within normal limits will improve Outcome: Progressing Goal: Will remain  free from infection Outcome: Progressing Goal: Diagnostic test results will improve Outcome: Progressing Goal: Respiratory complications will improve Outcome: Progressing Goal: Cardiovascular complication will be avoided Outcome: Progressing   Problem: Activity: Goal: Risk for activity intolerance will decrease Outcome: Progressing   Problem: Nutrition: Goal: Adequate nutrition will be maintained Outcome: Progressing   Problem: Coping: Goal: Level of anxiety will decrease Outcome: Progressing   Problem: Elimination: Goal: Will not experience complications related to bowel motility Outcome: Progressing Goal: Will not experience complications related to urinary retention Outcome: Progressing   Problem: Pain Managment: Goal: General experience of comfort will improve Outcome: Progressing   Problem: Safety: Goal: Ability to remain free from injury will improve Outcome: Progressing   Problem: Skin Integrity: Goal: Risk for impaired skin integrity will decrease Outcome: Progressing

## 2019-08-27 NOTE — Progress Notes (Signed)
PROGRESS NOTE  Lee Hooper  DOB: 06-05-1970  PCP: Patient, No Pcp Per BZJ:[id number]  DOA: 07/01/2019  LOS: 86 days   Chief Complaint  Patient presents with  . Code Stroke   Brief narrative: Lee Hooper is a 49 y.o. male , chronic daily smoker with COPD and psoriasis who was admitted on 4/28 primarily under neurology service.   Medicine consultation was: 6/19 for comanagement of medical issues.    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  Underwent arteriogram with revascularization/thrombectomy but had rethrombosis and hence underwent L depressive hemicraniectomy.  He remained intubated in ICU postprocedure. Could not be extubated. 5/8 -trach placement.  Also found to have bilateral lower extremity DVT. 5/9 -IVC filter placement.  5/23 -Eliquis resumed for DVT 6/4 -required scalp wound revision on 6/4 and is planned for cranioplasty next week.  6/6 -transfered out of ICU.  6/17 -started on dysphagia diet. 6/19 -started having fever up to 103.2, tachycardia. Chest x-ray with possible aspiration.  Sepsis work-up started and hospitalist service was consulted.  Subjective: Patient was seen and examined this morning.   Somnolent.  Opens eyes on touch stimulus.  Falls back to sleep quickly.   Slow wheezing out of his hernia in the scalp  Fever and heart rate slowly trending down.  Assessment/Plan: Sepsis likely secondary to intracranial infection -Unfortunate young man with catastrophic CVA in April, still recovering with dense R hemiparesis -6/19 -started having fever, tachycardia. -Initial suspicion was for aspiration pneumonia.  He was placed on broad-spectrum antibiotics despite which he continued to spike fever. -MRI brain obtained on 6/23 showed findings concerning for infection superimposed on infarcted tissue. -Patient has small amount of wheezing of cerebral fluid ongoing. -Case discussed with neurology, neurosurgery and  infectious disease.  -Neurosurgery Dr. Christella Noa plans to aspirate the cerebral fluid today.  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  Underwent arteriogram with revascularization/thrombectomy but had rethrombosis and hence underwent L depressive hemicraniectomy.  -Trach/PEG in place -Planned for cranioplasty soon but may need to delay depending on infection -Continue PEG tube feeding.  DVT -5/8 -extensive B LE DVT -5/9  -IVC filter placed  -5/23 -Eliquis initiated  Psoriasis -No apparent treatment as outpatient  Hyperglycemia - A1c 5.5 on 4/29 -Continue sliding scale with Accu-Cheks  Pressure injury -Stage 1 sacral ulcer, appears unrelated to current sepsis Pressure Injury 07/15/19 Other (Comment) Right Stage 3 -  Full thickness tissue loss. Subcutaneous fat may be visible but bone, tendon or muscle are NOT exposed. reddened, open indention under right side of tracheostomy (Active)  07/15/19 0330  Location: Other (Comment) (under tracheostomy)  Location Orientation: Right  Staging: Stage 3 -  Full thickness tissue loss. Subcutaneous fat may be visible but bone, tendon or muscle are NOT exposed.  Wound Description (Comments): reddened, open indention under right side of tracheostomy  Present on Admission: No    Nutrition Problem: Inadequate oral intake Etiology: inability to eat  Signs/Symptoms: NPO status  Mobility: PT following Code Status:   Code Status: Prior  DVT prophylaxis:  Eliquis Antimicrobials:  Cefepime, metronidazole, vancomycin Fluid: Continue LR at 75.  Diet: PEG tube feeding ongoing.  Oral feeding has been held.  Family Communication:  None at bedside.  Infusions:  . sodium chloride Stopped (07/21/19 0012)  . ceFEPime (MAXIPIME) IV 2 g (08/27/19 1310)  . lactated ringers 75 mL/hr at 08/27/19 0547  . metronidazole 500 mg (08/27/19 0752)  . vancomycin 1,250 mg (  08/27/19 0925)    Scheduled Meds: . apixaban  5 mg Per Tube BID  .  chlorhexidine gluconate (MEDLINE KIT)  15 mL Mouth Rinse BID  . Chlorhexidine Gluconate Cloth  6 each Topical Daily  . feeding supplement (OSMOLITE 1.5 CAL)  474 mL Per Tube TID  . feeding supplement (PRO-STAT SUGAR FREE 64)  30 mL Per Tube TID  . free water  150 mL Per Tube Q4H  . insulin aspart  0-9 Units Subcutaneous TID WC  . mouth rinse  15 mL Mouth Rinse q12n4p  . metoprolol tartrate  37.5 mg Per Tube BID  . multivitamin  15 mL Per Tube Daily  . mupirocin ointment   Nasal BID  . nutrition supplement (JUVEN)  1 packet Per Tube BID BM  . pantoprazole sodium  40 mg Per Tube Daily  . sodium chloride flush  10-40 mL Intracatheter Q12H    Antimicrobials: Anti-infectives (From admission, onward)   Start     Dose/Rate Route Frequency Ordered Stop   08/27/19 0000  vancomycin (VANCOREADY) IVPB 1250 mg/250 mL     Discontinue     1,250 mg 166.7 mL/hr over 90 Minutes Intravenous Every 8 hours 08/26/19 1701     08/24/19 1610  vancomycin (VANCOREADY) IVPB 1500 mg/300 mL  Status:  Discontinued        1,500 mg 150 mL/hr over 120 Minutes Intravenous Every 12 hours 08/24/19 1610 08/26/19 1701   08/23/19 0400  vancomycin (VANCOREADY) IVPB 1250 mg/250 mL  Status:  Discontinued        1,250 mg 166.7 mL/hr over 90 Minutes Intravenous Every 12 hours 08/22/19 1526 08/24/19 1610   08/22/19 1530  metroNIDAZOLE (FLAGYL) IVPB 500 mg     Discontinue     500 mg 100 mL/hr over 60 Minutes Intravenous Every 8 hours 08/22/19 1511     08/22/19 1530  ceFEPIme (MAXIPIME) 2 g in sodium chloride 0.9 % 100 mL IVPB     Discontinue     2 g 200 mL/hr over 30 Minutes Intravenous Every 8 hours 08/22/19 1525     08/22/19 1530  vancomycin (VANCOREADY) IVPB 1750 mg/350 mL        1,750 mg 175 mL/hr over 120 Minutes Intravenous  Once 08/22/19 1526 08/22/19 1814   08/22/19 1515  ceFEPIme (MAXIPIME) 2 g in sodium chloride 0.9 % 100 mL IVPB  Status:  Discontinued        2 g 200 mL/hr over 30 Minutes Intravenous  Once 08/22/19  1511 08/22/19 1525   08/22/19 1515  vancomycin (VANCOCIN) IVPB 1000 mg/200 mL premix  Status:  Discontinued        1,000 mg 200 mL/hr over 60 Minutes Intravenous  Once 08/22/19 1511 08/22/19 1525   07/28/19 0800  vancomycin (VANCOCIN) IVPB 1000 mg/200 mL premix  Status:  Discontinued        1,000 mg 200 mL/hr over 60 Minutes Intravenous Every 8 hours 07/27/19 2300 07/30/19 1522   07/27/19 2300  ceFEPIme (MAXIPIME) 2 g in sodium chloride 0.9 % 100 mL IVPB  Status:  Discontinued        2 g 200 mL/hr over 30 Minutes Intravenous Every 8 hours 07/27/19 2251 08/02/19 0927   07/27/19 2300  vancomycin (VANCOREADY) IVPB 2000 mg/400 mL        2,000 mg 200 mL/hr over 120 Minutes Intravenous  Once 07/27/19 2258 07/28/19 0130   07/27/19 2245  ceFEPIme (MAXIPIME) 2 g in sodium chloride 0.9 % 100 mL IVPB  Status:  Discontinued        2 g 200 mL/hr over 30 Minutes Intravenous Every 12 hours 07/27/19 2244 07/27/19 2251   07/17/19 1400  ceFAZolin (ANCEF) IVPB 2g/100 mL premix        2 g 200 mL/hr over 30 Minutes Intravenous Every 8 hours 07/17/19 0922 07/20/19 2213   07/16/19 1400  ceFEPIme (MAXIPIME) 2 g in sodium chloride 0.9 % 100 mL IVPB  Status:  Discontinued        2 g 200 mL/hr over 30 Minutes Intravenous Every 8 hours 07/16/19 1132 07/17/19 0922   07/16/19 1200  vancomycin (VANCOREADY) IVPB 1250 mg/250 mL  Status:  Discontinued        1,250 mg 166.7 mL/hr over 90 Minutes Intravenous Every 24 hours 07/15/19 1333 07/17/19 0923   07/15/19 2200  ceFEPIme (MAXIPIME) 2 g in sodium chloride 0.9 % 100 mL IVPB  Status:  Discontinued        2 g 200 mL/hr over 30 Minutes Intravenous Every 12 hours 07/15/19 1253 07/16/19 1132   07/15/19 1200  vancomycin (VANCOREADY) IVPB 1500 mg/300 mL  Status:  Discontinued        1,500 mg 150 mL/hr over 120 Minutes Intravenous Every 24 hours 07/14/19 1037 07/15/19 1334   07/14/19 1400  ceFEPIme (MAXIPIME) 2 g in sodium chloride 0.9 % 100 mL IVPB  Status:  Discontinued         2 g 200 mL/hr over 30 Minutes Intravenous Every 8 hours 07/14/19 1015 07/15/19 1253   07/14/19 1045  vancomycin (VANCOREADY) IVPB 1750 mg/350 mL        1,750 mg 175 mL/hr over 120 Minutes Intravenous  Once 07/14/19 1037 07/14/19 1455   07/08/19 1100  Ampicillin-Sulbactam (UNASYN) 3 g in sodium chloride 0.9 % 100 mL IVPB        3 g 200 mL/hr over 30 Minutes Intravenous Every 6 hours 07/08/19 1057 07/09/19 2319   07/07/19 2200  amoxicillin-clavulanate (AUGMENTIN) 875-125 MG per tablet 1 tablet  Status:  Discontinued        1 tablet Per Tube Every 12 hours 07/07/19 1546 07/08/19 1057   07/05/19 1500  amoxicillin-clavulanate (AUGMENTIN) 875-125 MG per tablet 1 tablet  Status:  Discontinued        1 tablet Per Tube Every 12 hours 07/05/19 1214 07/07/19 1546   07/03/19 1000  ceFEPIme (MAXIPIME) 2 g in sodium chloride 0.9 % 100 mL IVPB  Status:  Discontinued        2 g 200 mL/hr over 30 Minutes Intravenous Every 8 hours 07/03/19 0926 07/05/19 1214   07/01/19 0923  ceFAZolin (ANCEF) 2-4 GM/100ML-% IVPB       Note to Pharmacy: Domenick Bookbinder   : cabinet override      07/01/19 0923 07/01/19 2129      PRN meds: sodium chloride, acetaminophen, fentaNYL (SUBLIMAZE) injection, ibuprofen, iohexol, ipratropium-albuterol, ketorolac, metoprolol tartrate, ondansetron **OR** ondansetron (ZOFRAN) IV, promethazine, sodium chloride flush   Objective: Vitals:   08/27/19 1127 08/27/19 1312  BP: 103/65   Pulse: (!) 109   Resp: (!) 30   Temp:  100.2 F (37.9 C)  SpO2: 95%     Intake/Output Summary (Last 24 hours) at 08/27/2019 1421 Last data filed at 08/27/2019 0556 Gross per 24 hour  Intake 4552.09 ml  Output 225 ml  Net 4327.09 ml   Filed Weights   08/24/19 0500 08/25/19 0443 08/26/19 0600  Weight: 96.5 kg 96.3 kg 99.8 kg   Weight  change:  Body mass index is 29.84 kg/m.   Physical Exam: General exam: Middle-aged Caucasian male.  Continues to look lethargic. Skin: No rashes, lesions or  ulcers. HEENT: Left hemicraniectomy status.  Bulging on the left side of the head with herniation of intracranial contents.   Lungs: Clear to auscultation bilaterally.  Fair respiratory effort.  Has trach collar anteriorly in the neck CVS: Tachycardic, no murmur GI/Abd soft, nontender, nondistended, bowel sound present CNS: Alert, awake, followd verbal command to move the left side. Right dense hemiplegia. Psychiatry: Depressed look Extremities: No pedal edema, no calf tenderness  Data Review: I have personally reviewed the laboratory data and studies available.  Recent Labs  Lab 08/22/19 1255 08/22/19 1915 08/24/19 0440 08/25/19 0500 08/26/19 0405  WBC 13.4* 16.0* 12.5* 8.7 8.9  NEUTROABS  --   --  9.3* 6.1 6.3  HGB 14.2 15.7 12.0* 11.9* 11.7*  HCT 43.9 49.2 38.5* 38.0* 36.9*  MCV 93.8 96.1 97.7 97.4 96.9  PLT 225 242 164 205 232   Recent Labs  Lab 08/22/19 1255 08/22/19 1915 08/24/19 0440 08/25/19 0500 08/26/19 0405  NA 134* 131* 138 141 138  K 3.8 4.3 3.6 3.4* 4.0  CL 100 97* 107 108 105  CO2 24 24 22 28 25   GLUCOSE 145* 150* 106* 93 103*  BUN 23* 21* 13 14 14   CREATININE 0.84 0.87 0.65 0.72 0.72  CALCIUM 9.3 9.2 8.7* 8.8* 8.9  MG  --   --  1.9  --   --   PHOS  --   --  2.6  --   --    Recent Labs  Lab 08/22/19 1537 08/22/19 1915 08/22/19 2330  LATICACIDVEN 2.2* 3.3* 1.7  PROCALCITON <0.10  --   --      Signed, Terrilee Croak, MD [facility] Pager: [phone] (Secure Chat preferred). 08/27/2019

## 2019-08-27 NOTE — Progress Notes (Signed)
STROKE PROGRESS NOTE INTERVAL HISTORY  No changes. patient continues to be febrile 100.2.  He is on antibiotics but has  no clear source of infection.    ID team feels he may have CNS infection and  MRI brain w/wo .  Showed large left MCA/ACA infarct with herniation of brain tissue and complex fluid collection extending outside the confines of the calvarium.  Areas of restricted diffusion and enhancement concerning for infection superimposed on the infarcted tissue.   No neurological changes.  Vital signs stable.   Vitals:   08/27/19 1104 08/27/19 1127 08/27/19 1312 08/27/19 1500  BP:  103/65  109/63  Pulse:  (!) 109  (!) 103  Resp:  (!) 30  (!) 29  Temp: 99.8 F (37.7 C)  100.2 F (37.9 C)   TempSrc: Axillary  Rectal   SpO2:  95%  97%  Weight:      Height:       CBC:  Recent Labs  Lab 08/25/19 0500 08/26/19 0405  WBC 8.7 8.9  NEUTROABS 6.1 6.3  HGB 11.9* 11.7*  HCT 38.0* 36.9*  MCV 97.4 96.9  PLT 205 371   Basic Metabolic Panel:  Recent Labs  Lab 08/24/19 0440 08/24/19 0440 08/25/19 0500 08/26/19 0405  NA 138   < > 141 138  K 3.6   < > 3.4* 4.0  CL 107   < > 108 105  CO2 22   < > 28 25  GLUCOSE 106*   < > 93 103*  BUN 13   < > 14 14  CREATININE 0.65   < > 0.72 0.72  CALCIUM 8.7*   < > 8.8* 8.9  MG 1.9  --   --   --   PHOS 2.6  --   --   --    < > = values in this interval not displayed.    IMAGING past 24 hours MR BRAIN W WO CONTRAST  Result Date: 08/27/2019 CLINICAL DATA:  Stroke, follow-up.  Right hemiplegia. EXAM: MRI HEAD WITHOUT AND WITH CONTRAST TECHNIQUE: Multiplanar, multiecho pulse sequences of the brain and surrounding structures were obtained without and with intravenous contrast. CONTRAST:  48mL GADAVIST GADOBUTROL 1 MMOL/ML IV SOLN COMPARISON:  CT head 08/14/19 and 08/24/2019. MR head without contrast 07/01/2019. FINDINGS: Brain: Large left MCA and ACA territory infarct is again noted. Cranioplasty noted with marked herniation of brain tissue and  complex fluid collection extending outside of the confines of the calvarium. Areas of restricted diffusion are present within the encephalomalacia. Peripheral aspects of tissue have mostly liquified. Dense surrounding enhancement is present throughout the infarcted tissue. Dural and subarachnoid enhancement is noted anteriorly. Focal restricted diffusion is present in the left occipital lobe, adjacent to the previously infarcted tissue. This area is enhancing. Central collection demonstrates subtle increased T2 signal hyperintensity with associated restricted diffusion, likely hemorrhage. Focal restricted diffusion is also present along the posterior right ventricle tendon Mild. Right hemisphere is otherwise unremarkable. Residual midline shift measures 5 mm maximally. Signal changes in the left cerebral peduncle and pons are consistent with Wallerian degeneration. Vascular: The left ICA is occluded. Skull and upper cervical spine: Craniocervical junction is normal. Upper cervical spine is unremarkable. Sinuses/Orbits: Mucosal thickening is present in the left sphenoid sinus. Paranasal sinuses are otherwise clear. Globes and orbits are within normal limits. IMPRESSION: 1. Large left MCA and ACA territory infarct with marked herniation of brain tissue and complex fluid collection extending outside of the confines of the calvarium. 2.  Areas of restricted diffusion and enhancement involving the encephalomalacia. Findings are concerning for infection superimposed on the infarcted tissue. 3. Restricted diffusion in the left occipital lobe adjacent to the infarcted tissue may represent progressive infarct or focal adjacent infection. 4. Focal restricted diffusion adjacent to the right lateral ventricle is more likely ischemic. 5. Mass effect with midline shift of 5 mm. 6. Occlusion of the left ICA. 7. Wallerian degeneration of the left cerebral peduncle and pons. 8. Left sphenoid sinus disease. Electronically Signed   By:  Marin Roberts M.D.   On: 08/27/2019 06:46    PHYSICAL EXAM  General- middle-aged Caucasian male, s/p tracheostomy on trach collar, sleeping but arousable.   Ophthalmologic- fundi not visualized due to noncooperation.  Cardiovascular - Regular rate and rhythm.  Neuro - s/p tracheostomy on trach collar.  Eyes open but the patient is mute.  Currently febrile  and shivering.  Global aphasia, left gaze preference position, barely cross midline, notblinking to visual threat on the left, PERRL. Right facial droop. Tongue protrusion not cooperative. LUE spontaneous movement at least 4/5, LLE against gravity at least 3/5, right UE and LE flaccid still and does not seem to be developing rigidity or spasticity at this time, slight 1/5 movement with pain at RLE no withdrawal to pain in upper. Sensation, coordination and gait not tested. Not cooperative this morning.    ASSESSMENT/PLAN Lee Hooper is a 49 y.o. male with history of COPD, ongoing tobacco use, and psoriasis, presenting with dense LMCA syndrome. Attempt for Left ICA and Left M1 thrombectomy was done emergently with TICI 2b flow. Unfortunately, the pt had post procedure hemorrhage and reocclusion of the vessels. F/u imaging suggested early edema and pt underwent prophylactic decompressive hemicraniectomy.  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 Stroke etiology unknown - given DVT requiring eventual AC and high mRS, will not do further embolic workup  Eliquis for VTE prophylaxis  No antithrombotic prior to admission, now on Eliquis.  Therapy recommendations:  CIR   Disposition:  Pending  Left depressive hemicraniectomy on 07/01/19  Dr Franky Macho  Dr. Franky Macho  for cranioplasty, earliest next week.   Respiratory failure s/p Trach Trach wound  Downsized to cuffless trach 6 by CCM 5/24  Fever Tachycardia  Leukocytosis (believed aspiration pneumonia ->sepsis)  Temp - 103.2->102.9->102.2->100  Tachycardia - 127->133->130->86-> (metoprolol given by nursing per standing order) ->112  Known DVT - possible PE? - Chest CTA considered but pt already on Eliquis  IVC filter placed 07/12/19   Eliquis restarted 07/26/19 (5 mg Bid)  ABGs 08/22/19 - WNL except PO2 - 69.3  (CMP - unremarkable)  ;  (CBC - WBCs 13.4->16.0)  ;  (UA - cloudy with rare bacteria)  CXR - 6/19 - Minimal bibasilar opacification which may be due to atelectasis or infection.  Blood cultures -  No growth < 24 hrs  Lactic Acid - 2.2->3.3->1.7  Procalcitonin < 0.10  Respiratory cultures - pending  Dr Pola Corn consulting  Maxipime, Metronidazole and Vancomycin started 08/22/19 for Sepsis   Labs in AM   Appreciate Hospitalists' assistance  Hyperglycemia  On TF  Added SSI  Bilateral Lower Ext DVT, extensive Possible PE given tachycardia  Venous US  07/11/2019 DVT + bilateral extensive  IVC filter placed 5/9  On eliquis now for DVT treatment and prophylaxis  Dysphagia   PEG placement 5/14  FW 150 Q4h   On bolus TF -> cut in half to facilitate po intake   MBSS cleared for D1 nectar thick liquids  SLP following for tolerance  Tobacco abuse  Current smoker  Smoking cessation counseling will be provided if able  Other Stroke Risk Factors  Obesity, Body mass index is 29.84 kg/m., recommend weight loss, diet and exercise as appropriate    Likely undiagnosed obstructive sleep apnea based on body habitus  Other Active Problems  Na - 131   Hospital day # 33  Continue antibiotics as per medical hospitalist and ID team recommendations   Discussed with ID team nurse practitioner and Dr. Pola Corn.  Dr. Mikal Plane from neurosurgery plans to  stick a needle in his extracranial fluid collection sent for sensitivity and testing..  Greater than 50% time during this 25-minute visit was spent on counseling and coordination of care and discussion with care team.  Discussed with ID team and Dr. Pola Corn and answered questions  Delia Heady, MD   To contact Stroke Continuity provider, please refer to [email or web address]. After hours, contact General Neurology

## 2019-08-27 NOTE — Progress Notes (Signed)
Pt has returned from MRI and VSS.

## 2019-08-28 DIAGNOSIS — Z515 Encounter for palliative care: Secondary | ICD-10-CM

## 2019-08-28 DIAGNOSIS — Z7189 Other specified counseling: Secondary | ICD-10-CM

## 2019-08-28 DIAGNOSIS — Z66 Do not resuscitate: Secondary | ICD-10-CM

## 2019-08-28 LAB — GLUCOSE, CAPILLARY
Glucose-Capillary: 87 mg/dL (ref 70–99)
Glucose-Capillary: 154 mg/dL — ABNORMAL HIGH (ref 70–99)

## 2019-08-28 MED ORDER — IBUPROFEN 100 MG/5ML PO SUSP
400.0000 mg | Freq: Three times a day (TID) | ORAL | Status: DC | PRN
  Administered 2019-08-28: 400 mg
  Filled 2019-08-28 (×2): qty 20

## 2019-08-28 MED ORDER — MORPHINE SULFATE 10 MG/5ML PO SOLN: 5.0000 mg | ORAL | Status: DC | PRN

## 2019-08-28 MED ORDER — LORAZEPAM 2 MG/ML IJ SOLN: 0.5000 mg | INTRAMUSCULAR | Status: DC | PRN

## 2019-08-28 MED ORDER — GLYCOPYRROLATE 0.2 MG/ML IJ SOLN: 0.4000 mg | INTRAMUSCULAR | Status: DC | PRN

## 2019-08-28 MED ORDER — HYPROMELLOSE (GONIOSCOPIC) 2.5 % OP SOLN
1.0000 [drp] | Freq: Three times a day (TID) | OPHTHALMIC | Status: DC | PRN
  Filled 2019-08-28: qty 15

## 2019-08-28 MED ORDER — MORPHINE SULFATE (PF) 2 MG/ML IV SOLN: 1.0000 mg | INTRAVENOUS | Status: DC | PRN

## 2019-08-28 MED ORDER — BISACODYL 10 MG RE SUPP: 10.0000 mg | Freq: Every day | RECTAL | Status: DC | PRN

## 2019-08-28 MED ORDER — POLYVINYL ALCOHOL 1.4 % OP SOLN
1.0000 [drp] | Freq: Three times a day (TID) | OPHTHALMIC | Status: DC | PRN
  Filled 2019-08-28: qty 15

## 2019-08-28 NOTE — Consult Note (Signed)
Palliative Medicine Inpatient Consult Note  Reason for consult:  Goals of Care "Goals of care. young patient. poor prognosis. hopeful family"  HPI:  Per intake H&P --> Lee Hooper is a 49 yo M who presented as a code stroke after being found aphasic and nonverbal by his wife. CT/CTA studies showed left ICA/MCA thrombosis and pt was taken for IR thrombectomy for left ICA and left M1 occlusions. Repeat head CT was obtained following the procedure out of concern for possible hemorrhage and showed moderate basal ganglia hemorrhage vs stroke. Repeat MRI revealed that the ICA and MCA re-occluded with change in FLAIR sequence excluding him from repeat thrombectomy. He underwent a L hemicraniectomy. He was intubated for airway protection, he later underwent a tracheostomy and g-tube placement.  Palliative care was consulted as [name] has been hospitalized for fifty-eight days without significant improvements. Asked to address goals of care with patients family given his poor overall condition and unlikely ability to recover.   Clinical Assessment/Goals of Care: I have reviewed medical records including EPIC notes, labs and imaging, received report from bedside RN, assessed the patient.    I called Sabrina Majeed to further discuss diagnosis prognosis, GOC, EOL wishes, disposition and options.   I introduced Palliative Medicine as specialized medical care for people living with serious illness. It focuses on providing relief from the symptoms and stress of a serious illness. The goal is to improve quality of life for both the patient and the family.  I asked Gabriel Cirri to tell me about "[name]". She shares that he and she have been married for almost twenty five years. They met by coincidence. They share two children, [name] who is twenty three and [name] who is [age] one. He also has a grandchild, Recruitment consultant". He is described as the pride and joy of Steve's life. [name] is known to be a very funny  guy, someone who would always lift you up if you were feeling down. 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 He loved trains, old country music, and his family. He has faith in God but does not ascribe to any specific denomination.  Prior to hospitalization [name] was fully functional. Gabriel Cirri shares that he had COPD but that was his only known health problem. The stroke came as a shock to she and he children. Unfortunately they recently had to move into their trailer in the setting of COVID they got evicted from their apartment.   Per [employer] health is "not going good at all." She said that she has done her own research and she realizes that if he is fifty-eight days out from his event then he should be improving and not further declining. She shares that she has had multiple conversations with her children regarding his poor health state and they above all else do not wish to see him suffer any longer. Gabriel Cirri states that he would never want to live a life where he is reliant on other people. She and he two children had discussed this.    Concepts specific to code status, artifical feeding and hydration, continued IV antibiotics and rehospitalization was had.  Gabriel Cirri shares that [name] would never want to be like this. We discussed transitioning him toe DNAR/DNI code status which would enable him to pass peacefully at this point. Gabriel Cirri was very much in favor of this.   The difference between a aggressive medical intervention path  and a palliative comfort care path for this patient  at this time was had. Values and goals of care important to patient and family were attempted to be elicited  We talked about transition to comfort measures in house and what that would entail inclusive of medications to control pain, dyspnea, agitation, nausea, itching, and hiccups.  We discussed stopping all uneccessary measures such as blood draws, needle sticks, and frequent vital signs.    Utilized reflective listening throughout our time together.   All questions answered  Decision Maker: [name] (Spouse) [phone]  SUMMARY OF RECOMMENDATIONS   DNAR/DNI  Transition to comfort care  Continue to provide ongoing support  If symptoms stable after 24 hours will make plans to transition to residential hospice  Symptom management as below  Code Status/Advance Care Planning: DNAR/DNI   Symptom Management:  Dyspnea: Pain:  - Morphine 37m GT Q1H PRN  - Morphine 1-417mIV Q1H PRN Fever:  - Tylenol 65057mT Q6H PRN Agitation:  - Haldol 0.5mg55m/SL/IV Q4H PRN Anxiety:  - Lorazepam 10.5-mg PO/SL/IV  Q1H PRN Nausea:  - Zofran 4mg 42mIV Q6H PRN  -Phenergan for refractory nausea via GT   Secretions:  - Glycopyrrolate 0.4mg I26m4H PRN Dry Eyes:  - Artificial Tears PRN Xerostomia:  - Medline BID oral care Urinary Retention:  - Maintain foley  Constipation:  - Bisacodyl 10mg P69mN QDay Spiritual:  - Chaplain consult  Palliative Prophylaxis:   Oral care, Constipation, Turn Q2H, Delirium  Additional Recommendations (Limitations, Scope, Preferences):  Comfort Care   Psycho-social/Spiritual:   Desire for further Chaplaincy support: Yes  Additional Recommendations: Education on end of life care   Prognosis: Poor likely days  Discharge Planning: Discharge to residential hospice home  PPS: 10%    This conversation/these recommendations were discussed with patient primary care team, Dr. Sethi  Leonie ManIn: 1410 Time Out: 1520 Total Time: 70 Greater than 50%  of this time was spent counseling and coordinating care related to the above assessment and plan.  [place] Cell Phone: [phone] utilize secure chat with additional questions, if there is no response within 30 minutes please call the above phone number  Palliative Medicine Team providers are available by phone from 7am  to 7pm daily and can be reached through the team cell phone.  Should this patient require assistance outside of these hours, please call the patient's attending physician.

## 2019-08-28 NOTE — Progress Notes (Addendum)
STROKE PROGRESS NOTE INTERVAL HISTORY  No changes. patient continues to be febrile 101.0.  He is on antibiotics but has  no clear source of infection.    ID team feels he may have CNS infection and Dr. Cyndy Freeze has tapped the subgaleal fluid which shows increased white cells with 90% polymorphs but Gram smear is negative for bacteria and cultures so far is negative..   No neurological changes.  Vital signs stable.  Palliative care team has been consulted Vitals:   08/28/19 1155 08/28/19 1204 08/28/19 1253 08/28/19 1341  BP: 110/68 110/68 102/61 96/65  Pulse: (!) 116 (!) 115 (!) 105 96  Resp: (!) 28 (!) 27 (!) 30 (!) 29  Temp: (!) 101 F (38.3 C) (!) 101 F (38.3 C) (!) 101 F (38.3 C) 99.7 F (37.6 C)  TempSrc:    Rectal  SpO2: 97% 96% 95% 94%  Weight:      Height:       CBC:  Recent Labs  Lab 08/25/19 0500 08/26/19 0405  WBC 8.7 8.9  NEUTROABS 6.1 6.3  HGB 11.9* 11.7*  HCT 38.0* 36.9*  MCV 97.4 96.9  PLT 205 161   Basic Metabolic Panel:  Recent Labs  Lab 08/24/19 0440 08/24/19 0440 08/25/19 0500 08/26/19 0405  NA 138   < > 141 138  K 3.6   < > 3.4* 4.0  CL 107   < > 108 105  CO2 22   < > 28 25  GLUCOSE 106*   < > 93 103*  BUN 13   < > 14 14  CREATININE 0.65   < > 0.72 0.72  CALCIUM 8.7*   < > 8.8* 8.9  MG 1.9  --   --   --   PHOS 2.6  --   --   --    < > = values in this interval not displayed.    IMAGING past 24 hours No results found.  PHYSICAL EXAM  General- middle-aged Caucasian male, s/p tracheostomy on trach collar, sleeping but arousable.   Ophthalmologic- fundi not visualized due to noncooperation.  Cardiovascular - Regular rate and rhythm.  Neuro - s/p tracheostomy on trach collar.  Eyes open but the patient is mute.  Currently febrile  and shivering.  Global aphasia, left gaze preference position, barely cross midline, notblinking to visual threat on the left, PERRL. Right facial droop. Tongue protrusion not cooperative. LUE spontaneous  movement at least 4/5, LLE against gravity at least 3/5, right UE and LE flaccid still and does not seem to be developing rigidity or spasticity at this time, slight 1/5 movement with pain at RLE no withdrawal to pain in upper. Sensation, coordination and gait not tested. Not cooperative this morning.    ASSESSMENT/PLAN [name] with history of COPD, ongoing tobacco use, and psoriasis, presenting with dense LMCA syndrome. Attempt for Left ICA and Left M1 thrombectomy was done emergently with TICI 2b flow. Unfortunately, the pt had post procedure hemorrhage and reocclusion of the vessels. F/u imaging suggested early edema and pt underwent prophylactic decompressive hemicraniectomy.  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 Stroke etiology unknown - given DVT requiring eventual  AC and high mRS, will not do further embolic workup  Eliquis for VTE prophylaxis  No antithrombotic prior to admission, now on Eliquis.  Therapy recommendations:  CIR   Disposition:  Pending  Left depressive hemicraniectomy on 07/01/19 Dr Franky Macho  Dr. Franky Macho  for cranioplasty, earliest next week.   Respiratory failure s/p Trach Trach wound  Downsized to cuffless trach 6 by CCM 5/24  Fever Tachycardia  Leukocytosis (believed aspiration pneumonia ->sepsis)  Temp - 103.2->102.9->102.2->100  Tachycardia - 127->133->130->86-> (metoprolol given by nursing per standing order) ->112  Known DVT - possible PE? - Chest CTA considered but pt already on Eliquis  IVC filter placed 07/12/19   Eliquis restarted 07/26/19 (5 mg Bid)  ABGs 08/22/19 - WNL except PO2 - 69.3  (CMP - unremarkable)  ;  (CBC - WBCs  13.4->16.0)  ;  (UA - cloudy with rare bacteria)  CXR - 6/19 - Minimal bibasilar opacification which may be due to atelectasis or infection.  Blood cultures -  No growth < 24 hrs  Lactic Acid - 2.2->3.3->1.7  Procalcitonin < 0.10  Respiratory cultures - pending  Dr Pola Corn consulting  Maxipime, Metronidazole and Vancomycin started 08/22/19 for Sepsis   Labs in AM   Appreciate Hospitalists' assistance  Hyperglycemia  On TF  Added SSI  Bilateral Lower Ext DVT, extensive Possible PE given tachycardia  Venous US 07/11/2019 DVT + bilateral extensive  IVC filter placed 5/9  On eliquis now for DVT treatment and prophylaxis  Dysphagia   PEG placement 5/14  FW 150 Q4h   On bolus TF -> cut in half to facilitate po intake   MBSS cleared for D1 nectar thick liquids  SLP following for tolerance  Tobacco abuse  Current smoker  Smoking cessation counseling will be provided if able  Other Stroke Risk Factors  Obesity, Body mass index is 29.84 kg/m., recommend weight loss, diet and exercise as appropriate    Likely undiagnosed obstructive sleep apnea based on body habitus  Other Active Problems  Na - 131   Hospital day # 66  Patient's has had continuing ongoing medical problems for the last nearly 2 months that he has been sick.  His quality of life going forward is also quite limited.  Agree palliative care consultation and discussion about goals of care with wife is appropriate.  I would be agreeable with comfort care measures only if family is willing.  Discussed with ID team nurse practitioner and Dr. Pola Corn.   .  Greater than 50% time during this 25-minute visit was spent on counseling and coordination of care and discussion with care team.  Discussed with ID team, palliative care nurse practitioner and Dr. Pola Corn and answered questions  Delia Heady, MD   To contact Stroke Continuity provider, please refer to [email or web address]. After hours, contact General Neurology

## 2019-08-28 NOTE — TOC Progression Note (Signed)
Transition of Care [facility]) - Progression Note    Patient Details  Name: [name] MRN: [id number] Date of Birth: [birth year]  Transition of Care [facility]) CM/SW Contact  Baldemar Lenis, [place] Phone Number: 08/28/2019, 4:47 PM  Clinical Narrative:   CSW alerted by Palliative NP that patient's family is now desiring comfort care due to patient's worsening condition, would like to be closer to home. CSW spoke with patient's wife, [name], to confirm that they live in [place]. [facility] in [place] would be the closest hospice facility. [name] is agreeable. CSW spoke with Cheri and provided referral, she will follow. Hopeful for bed availability tomorrow. CSW to follow.    Expected Discharge Plan: Hospice Medical Facility Barriers to Discharge: Hospice Bed not available  Expected Discharge Plan and Services Expected Discharge Plan: Hospice Medical Facility   Discharge Planning Services: CM Consult   Living arrangements for the past 2 months: Single Family Home                                       Social Determinants of Health (SDOH) Interventions    Readmission Risk Interventions No flowsheet data found.

## 2019-08-28 NOTE — Progress Notes (Signed)
Physical Therapy Treatment Patient Details Name: Lee Hooper MRN: [id number] DOB: 11-19-1970 Today's Date: 08/28/2019    History of Present Illness Pt is a 49 y.o. male admitted 4/28 with Rt sided hemiplegia and aphasia.  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  Repeat CT following procedure showed moderate basal ganglia hemorrhage vs. stroke   Repeat MRI revealed that the ICA and MCA re-occluded with change in FLAIR sequence excluding him from repeat thrombectomy.  He underwent a decompressive hemicraniectomy.  He was intubated 4/28, trach 5/8; liberated from ventilator 5/14.  He developed an ileus 5/2.  5/9 IVC filter placed due to bil LE DVTs (extensive). PEG 5/14. PMH includes: COPD, tobacco abuse, psoriasis    PT Comments    Pt very lethargic throughout and not very responsive. He was able to squeeze therapist's fingers with L hand on command twice. He continues to require total A x2 for bed mobility. All VSS throughout. Of note, PT orders were discontinued after session today.     Follow Up Recommendations  CIR     Equipment Recommendations  Wheelchair (measurements PT);Wheelchair cushion (measurements PT);Hospital bed    Recommendations for Other Services       Precautions / Restrictions Precautions Precautions: Fall Precaution Comments: R hemi, trach (has PMSV), PEG, crani-No Bone flap L head; rt shoulder pain/subluxation; bil LE DVTs with IVC filter Restrictions Weight Bearing Restrictions: No    Mobility  Bed Mobility Overal bed mobility: Needs Assistance             General bed mobility comments: total A x2 for repositioning in bed; pt unable to follow any commands other than squeezing therapist's fingers with L hand  Transfers                    Ambulation/Gait                 Stairs             Wheelchair Mobility    Modified Rankin (Stroke Patients Only) Modified Rankin (Stroke Patients Only) Pre-Morbid  Rankin Score: No symptoms Modified Rankin: Severe disability     Balance                                            Cognition Arousal/Alertness: Lethargic Behavior During Therapy: Flat affect Overall Cognitive Status: Difficult to assess                                        Exercises      General Comments        Pertinent Vitals/Pain Pain Assessment: Faces Faces Pain Scale: No hurt    Home Living                      Prior Function            PT Goals (current goals can now be found in the care plan section) Acute Rehab PT Goals PT Goal Formulation: Patient unable to participate in goal setting Time For Goal Achievement: 08/24/19 Potential to Achieve Goals: Fair Progress towards PT goals: Progressing toward goals    Frequency    Other (Comment) (PT orders d/c'd by medical team after session)  PT Plan Other (comment) (PT orders d/c'd by medical team after session)    Co-evaluation              AM-PAC PT "6 Clicks" Mobility   Outcome Measure  Help needed turning from your back to your side while in a flat bed without using bedrails?: Total Help needed moving from lying on your back to sitting on the side of a flat bed without using bedrails?: Total Help needed moving to and from a bed to a chair (including a wheelchair)?: Total Help needed standing up from a chair using your arms (e.g., wheelchair or bedside chair)?: Total Help needed to walk in hospital room?: Total Help needed climbing 3-5 steps with a railing? : Total 6 Click Score: 6    End of Session Equipment Utilized During Treatment: Oxygen;Other (comment) (trach collar) Activity Tolerance: Patient limited by lethargy Patient left: in bed;with call bell/phone within reach;with bed alarm set Nurse Communication: Mobility status;Need for lift equipment PT Visit Diagnosis: Other abnormalities of gait and mobility (R26.89);Hemiplegia and  hemiparesis Hemiplegia - Right/Left: Right Hemiplegia - dominant/non-dominant: Dominant Hemiplegia - caused by: Nontraumatic intracerebral hemorrhage     Time: 5427-0623 PT Time Calculation (min) (ACUTE ONLY): 15 min  Charges:  $Therapeutic Activity: 8-22 mins                     Arletta Bale, DPT  Acute Rehabilitation Services Pager [phone] Office [phone]     Alessandra Bevels Ajahni Nay 08/28/2019, 2:44 PM

## 2019-08-28 NOTE — Progress Notes (Signed)
PROGRESS NOTE  Lee Hooper  DOB: 08-03-1970  PCP: Patient, No Pcp Per [id number]  DOA: 07/01/2019  LOS: 44 days   Chief Complaint  Patient presents with  . Code Stroke   Brief narrative: Lee Hooper is a 49 y.o. male , chronic daily smoker with COPD and psoriasis who was admitted on 4/28 primarily under neurology service.   Medicine consultation was: 6/19 for comanagement of medical issues.    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  Underwent arteriogram with revascularization/thrombectomy but had rethrombosis and hence underwent L depressive hemicraniectomy.  He remained intubated in ICU postprocedure. Could not be extubated. 5/8 -trach placement.  Also found to have bilateral lower extremity DVT. 5/9 -IVC filter placement.  5/23 -Eliquis resumed for DVT 6/4 -required scalp wound revision on 6/4 and is planned for cranioplasty next week.  6/6 -transfered out of ICU.  6/17 -started on dysphagia diet. 6/19 -started having fever up to 103.2, tachycardia. Chest x-ray with possible aspiration.  Sepsis work-up started and hospitalist service was consulted. 6/23 -MRI brain obtained on 6/23 showed findings concerning for infection superimposed on infarcted tissue.  Subjective: Patient was seen and examined this morning.   Lethargic.  Opens eyes on touch.  Able to follow some commands on the right side.  Dense left hemiplegia. Continues to have persistent fever. Continues to have slow oozing of intra cranial fluid from the site of herniation Very sick.   Highly appreciate palliative consultation today.  Comfort care measures started.  Assessment/Plan: Sepsis likely secondary to intracranial infection -Unfortunate young man with catastrophic CVA in April, still recovering with dense R hemiparesis -6/19 -started having fever, tachycardia. -Initial suspicion was for aspiration pneumonia.  He was placed on broad-spectrum antibiotics  despite which he continued to spike fever. -MRI brain obtained on 6/23 showed findings concerning for infection superimposed on infarcted tissue. -Palliative care consultation highly appreciated. Comfort care measures started.  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  Underwent arteriogram with revascularization/thrombectomy but had rethrombosis and hence underwent L depressive hemicraniectomy.  -Trach/PEG in place  DVT -5/8 -extensive B LE DVT -5/9 -IVC filter placed  -5/23 -Eliquis initiated  Psoriasis -No apparent treatment as outpatient  Hyperglycemia - A1c 5.5 on 4/29 -Continue sliding scale with Accu-Cheks  Pressure injury -Stage 1 sacral ulcer, appears unrelated to current sepsis Pressure Injury 07/15/19 Other (Comment) Right Stage 3 -  Full thickness tissue loss. Subcutaneous fat may be visible but bone, tendon or muscle are NOT exposed. reddened, open indention under right side of tracheostomy (Active)  07/15/19 0330  Location: Other (Comment) (under tracheostomy)  Location Orientation: Right  Staging: Stage 3 -  Full thickness tissue loss. Subcutaneous fat may be visible but bone, tendon or muscle are NOT exposed.  Wound Description (Comments): reddened, open indention under right side of tracheostomy  Present on Admission: No    Nutrition Problem: Inadequate oral intake Etiology: inability to eat  Signs/Symptoms: NPO status  Infusions:    Scheduled Meds: . chlorhexidine gluconate (MEDLINE KIT)  15 mL Mouth Rinse BID  . mouth rinse  15 mL Mouth Rinse q12n4p    Antimicrobials: Anti-infectives (From admission, onward)   Start     Dose/Rate Route Frequency Ordered Stop   08/27/19 0000  vancomycin (VANCOREADY) IVPB 1250 mg/250 mL  Status:  Discontinued        1,250 mg 166.7 mL/hr over 90 Minutes Intravenous Every 8 hours 08/26/19 1701 08/28/19 1358  08/24/19 1610  vancomycin (VANCOREADY) IVPB 1500 mg/300 mL  Status:  Discontinued         1,500 mg 150 mL/hr over 120 Minutes Intravenous Every 12 hours 08/24/19 1610 08/26/19 1701   08/23/19 0400  vancomycin (VANCOREADY) IVPB 1250 mg/250 mL  Status:  Discontinued        1,250 mg 166.7 mL/hr over 90 Minutes Intravenous Every 12 hours 08/22/19 1526 08/24/19 1610   08/22/19 1530  metroNIDAZOLE (FLAGYL) IVPB 500 mg  Status:  Discontinued        500 mg 100 mL/hr over 60 Minutes Intravenous Every 8 hours 08/22/19 1511 08/28/19 0918   08/22/19 1530  ceFEPIme (MAXIPIME) 2 g in sodium chloride 0.9 % 100 mL IVPB  Status:  Discontinued        2 g 200 mL/hr over 30 Minutes Intravenous Every 8 hours 08/22/19 1525 08/28/19 1358   08/22/19 1530  vancomycin (VANCOREADY) IVPB 1750 mg/350 mL        1,750 mg 175 mL/hr over 120 Minutes Intravenous  Once 08/22/19 1526 08/22/19 1814   08/22/19 1515  ceFEPIme (MAXIPIME) 2 g in sodium chloride 0.9 % 100 mL IVPB  Status:  Discontinued        2 g 200 mL/hr over 30 Minutes Intravenous  Once 08/22/19 1511 08/22/19 1525   08/22/19 1515  vancomycin (VANCOCIN) IVPB 1000 mg/200 mL premix  Status:  Discontinued        1,000 mg 200 mL/hr over 60 Minutes Intravenous  Once 08/22/19 1511 08/22/19 1525   07/28/19 0800  vancomycin (VANCOCIN) IVPB 1000 mg/200 mL premix  Status:  Discontinued        1,000 mg 200 mL/hr over 60 Minutes Intravenous Every 8 hours 07/27/19 2300 07/30/19 1522   07/27/19 2300  ceFEPIme (MAXIPIME) 2 g in sodium chloride 0.9 % 100 mL IVPB  Status:  Discontinued        2 g 200 mL/hr over 30 Minutes Intravenous Every 8 hours 07/27/19 2251 08/02/19 0927   07/27/19 2300  vancomycin (VANCOREADY) IVPB 2000 mg/400 mL        2,000 mg 200 mL/hr over 120 Minutes Intravenous  Once 07/27/19 2258 07/28/19 0130   07/27/19 2245  ceFEPIme (MAXIPIME) 2 g in sodium chloride 0.9 % 100 mL IVPB  Status:  Discontinued        2 g 200 mL/hr over 30 Minutes Intravenous Every 12 hours 07/27/19 2244 07/27/19 2251   07/17/19 1400  ceFAZolin (ANCEF) IVPB 2g/100 mL  premix        2 g 200 mL/hr over 30 Minutes Intravenous Every 8 hours 07/17/19 0922 07/20/19 2213   07/16/19 1400  ceFEPIme (MAXIPIME) 2 g in sodium chloride 0.9 % 100 mL IVPB  Status:  Discontinued        2 g 200 mL/hr over 30 Minutes Intravenous Every 8 hours 07/16/19 1132 07/17/19 0922   07/16/19 1200  vancomycin (VANCOREADY) IVPB 1250 mg/250 mL  Status:  Discontinued        1,250 mg 166.7 mL/hr over 90 Minutes Intravenous Every 24 hours 07/15/19 1333 07/17/19 0923   07/15/19 2200  ceFEPIme (MAXIPIME) 2 g in sodium chloride 0.9 % 100 mL IVPB  Status:  Discontinued        2 g 200 mL/hr over 30 Minutes Intravenous Every 12 hours 07/15/19 1253 07/16/19 1132   07/15/19 1200  vancomycin (VANCOREADY) IVPB 1500 mg/300 mL  Status:  Discontinued        1,500 mg  150 mL/hr over 120 Minutes Intravenous Every 24 hours 07/14/19 1037 07/15/19 1334   07/14/19 1400  ceFEPIme (MAXIPIME) 2 g in sodium chloride 0.9 % 100 mL IVPB  Status:  Discontinued        2 g 200 mL/hr over 30 Minutes Intravenous Every 8 hours 07/14/19 1015 07/15/19 1253   07/14/19 1045  vancomycin (VANCOREADY) IVPB 1750 mg/350 mL        1,750 mg 175 mL/hr over 120 Minutes Intravenous  Once 07/14/19 1037 07/14/19 1455   07/08/19 1100  Ampicillin-Sulbactam (UNASYN) 3 g in sodium chloride 0.9 % 100 mL IVPB        3 g 200 mL/hr over 30 Minutes Intravenous Every 6 hours 07/08/19 1057 07/09/19 2319   07/07/19 2200  amoxicillin-clavulanate (AUGMENTIN) 875-125 MG per tablet 1 tablet  Status:  Discontinued        1 tablet Per Tube Every 12 hours 07/07/19 1546 07/08/19 1057   07/05/19 1500  amoxicillin-clavulanate (AUGMENTIN) 875-125 MG per tablet 1 tablet  Status:  Discontinued        1 tablet Per Tube Every 12 hours 07/05/19 1214 07/07/19 1546   07/03/19 1000  ceFEPIme (MAXIPIME) 2 g in sodium chloride 0.9 % 100 mL IVPB  Status:  Discontinued        2 g 200 mL/hr over 30 Minutes Intravenous Every 8 hours 07/03/19 0926 07/05/19 1214    07/01/19 0923  ceFAZolin (ANCEF) 2-4 GM/100ML-% IVPB       Note to Pharmacy: Domenick Bookbinder   : cabinet override      07/01/19 0923 07/01/19 2129      PRN meds: bisacodyl, fentaNYL (SUBLIMAZE) injection, glycopyrrolate, ketorolac, LORazepam, morphine, morphine injection, ondansetron **OR** ondansetron (ZOFRAN) IV, polyvinyl alcohol, promethazine   Objective: Vitals:   08/28/19 1253 08/28/19 1341  BP: 102/61 96/65  Pulse: (!) 105 96  Resp: (!) 30 (!) 29  Temp: (!) 101 F (38.3 C) 99.7 F (37.6 C)  SpO2: 95% 94%    Intake/Output Summary (Last 24 hours) at 08/28/2019 1607 Last data filed at 08/28/2019 0430 Gross per 24 hour  Intake 3614.83 ml  Output 1400 ml  Net 2214.83 ml   Filed Weights   08/24/19 0500 08/25/19 0443 08/26/19 0600  Weight: 96.5 kg 96.3 kg 99.8 kg   Weight change:  Body mass index is 29.84 kg/m.   Physical Exam: General exam: Middle-aged Caucasian male.  Continues to look lethargic. Skin: No rashes, lesions or ulcers. HEENT: Left hemicraniectomy status.  Bulging on the left side of the head with herniation of intracranial contents.   Lungs: Clear to auscultation bilaterally.  Fair respiratory effort.  Has trach collar anteriorly in the neck CVS: Tachycardic, no murmur GI/Abd soft, nontender, nondistended, bowel sound present CNS: Alert, awake, followd verbal command to move the left side. Right dense hemiplegia. Psychiatry: Depressed look Extremities: No pedal edema, no calf tenderness  Data Review: I have personally reviewed the laboratory data and studies available.  Recent Labs  Lab 08/22/19 1255 08/22/19 1915 08/24/19 0440 08/25/19 0500 08/26/19 0405  WBC 13.4* 16.0* 12.5* 8.7 8.9  NEUTROABS  --   --  9.3* 6.1 6.3  HGB 14.2 15.7 12.0* 11.9* 11.7*  HCT 43.9 49.2 38.5* 38.0* 36.9*  MCV 93.8 96.1 97.7 97.4 96.9  PLT 225 242 164 205 232   Recent Labs  Lab 08/22/19 1255 08/22/19 1915 08/24/19 0440 08/25/19 0500 08/26/19 0405  NA 134*  131* 138 141 138  K 3.8 4.3 3.6  3.4* 4.0  CL 100 97* 107 108 105  CO2 24 24 22 28 25   GLUCOSE 145* 150* 106* 93 103*  BUN 23* 21* 13 14 14   CREATININE 0.84 0.87 0.65 0.72 0.72  CALCIUM 9.3 9.2 8.7* 8.8* 8.9  MG  --   --  1.9  --   --   PHOS  --   --  2.6  --   --    Recent Labs  Lab 08/22/19 1537 08/22/19 1915 08/22/19 2330  LATICACIDVEN 2.2* 3.3* 1.7  PROCALCITON <0.10  --   --      Signed, Terrilee Croak, MD [facility] Pager: [phone] (Secure Chat preferred). 08/28/2019

## 2019-08-28 NOTE — Plan of Care (Signed)
Pt alert and 6 Trach in place on 5L. Cooling blanket in place to control Temp. Wife updated. BM x2.  Problem: Education: Goal: Knowledge of disease or condition will improve Outcome: Progressing Goal: Knowledge of secondary prevention will improve Outcome: Progressing Goal: Knowledge of patient specific risk factors addressed and post discharge goals established will improve Outcome: Progressing Goal: Individualized Educational Video(s) Outcome: Progressing   Problem: Coping: Goal: Will verbalize positive feelings about self Outcome: Progressing Goal: Will identify appropriate support needs Outcome: Progressing   Problem: Health Behavior/Discharge Planning: Goal: Ability to manage health-related needs will improve Outcome: Progressing   Problem: Self-Care: Goal: Ability to participate in self-care as condition permits will improve Outcome: Progressing Goal: Verbalization of feelings and concerns over difficulty with self-care will improve Outcome: Progressing Goal: Ability to communicate needs accurately will improve Outcome: Progressing   Problem: Nutrition: Goal: Risk of aspiration will decrease Outcome: Progressing Goal: Dietary intake will improve Outcome: Progressing   Problem: Ischemic Stroke/TIA Tissue Perfusion: Goal: Complications of ischemic stroke/TIA will be minimized Outcome: Progressing   Problem: Education: Goal: Knowledge of General Education information will improve Description: Including pain rating scale, medication(s)/side effects and non-pharmacologic comfort measures Outcome: Progressing   Problem: Health Behavior/Discharge Planning: Goal: Ability to manage health-related needs will improve Outcome: Progressing   Problem: Clinical Measurements: Goal: Ability to maintain clinical measurements within normal limits will improve Outcome: Progressing Goal: Will remain free from infection Outcome: Progressing Goal: Diagnostic test results will  improve Outcome: Progressing Goal: Respiratory complications will improve Outcome: Progressing Goal: Cardiovascular complication will be avoided Outcome: Progressing   Problem: Activity: Goal: Risk for activity intolerance will decrease Outcome: Progressing   Problem: Nutrition: Goal: Adequate nutrition will be maintained Outcome: Progressing   Problem: Coping: Goal: Level of anxiety will decrease Outcome: Progressing   Problem: Elimination: Goal: Will not experience complications related to bowel motility Outcome: Progressing Goal: Will not experience complications related to urinary retention Outcome: Progressing   Problem: Pain Managment: Goal: General experience of comfort will improve Outcome: Progressing   Problem: Safety: Goal: Ability to remain free from injury will improve Outcome: Progressing   Problem: Skin Integrity: Goal: Risk for impaired skin integrity will decrease Outcome: Progressing

## 2019-08-28 NOTE — Progress Notes (Signed)
Patient ID: Lee Hooper, male   DOB: 03-26-1970, 49 y.o.   MRN: [id number]         [facility] for Infectious Disease  Date of Admission:  07/01/2019           Day 7 vancomycin        Day 7 cefepime        Day 7 metronidazole ASSESSMENT: I remain concerned about the possibility of CNS infection.  Dr. Christella Noa obtained CSF and subgaleal fluid specimens.  The CSF white blood cell count was 2715 of which 90% were segmented neutrophils.  No organisms were seen on Gram stain.  No organisms were seen on the subgaleal fluid Gram stain either.  Cultures are pending.  PLAN: 1. Continue vancomycin and cefepime pending final culture results 2. 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 apixaban  5 mg Per Tube BID  . chlorhexidine gluconate (MEDLINE KIT)  15 mL Mouth Rinse BID  . Chlorhexidine Gluconate Cloth  6 each Topical Daily  . feeding supplement (OSMOLITE 1.5 CAL)  474 mL Per Tube TID  . feeding supplement (PRO-STAT SUGAR FREE 64)  30 mL Per Tube TID  . free water  150 mL Per Tube Q4H  . insulin aspart  0-9 Units Subcutaneous TID WC  . mouth rinse  15 mL Mouth Rinse q12n4p  . metoprolol tartrate  37.5 mg Per Tube BID  . multivitamin  15 mL Per Tube Daily  . mupirocin ointment   Nasal BID  . nutrition supplement (JUVEN)  1 packet Per Tube BID BM  . pantoprazole sodium  40 mg Per Tube Daily  . sodium chloride flush  10-40 mL Intracatheter Q12H   Continuous Infusions: . sodium  chloride Stopped (07/21/19 0012)  . ceFEPime (MAXIPIME) IV 2 g (08/28/19 7681)  . lactated ringers 75 mL/hr at 08/28/19 0837  . vancomycin 1,250 mg (08/28/19 0857)   PRN Meds:.sodium chloride, acetaminophen, fentaNYL (SUBLIMAZE) injection, ibuprofen, iohexol, ipratropium-albuterol, ketorolac, metoprolol tartrate, ondansetron **OR** ondansetron (ZOFRAN) IV, promethazine, sodium chloride flush  Review of Systems: Review of Systems  Unable to perform ROS: Mental acuity    Allergies  Allergen Reactions  . Tegaderm Ag Mesh [Silver] Other (See Comments)    blisters    OBJECTIVE: Vitals:   08/28/19 0801 08/28/19 0802 08/28/19 0812 08/28/19 0916  BP:   (!) 129/92 (!) 111/96  Pulse: (!) 112 (!) 102 100 (!) 101  Resp: (!) 30 (!) 26 (!) 22 (!) 25  Temp:   100 F (37.8 C) (!) 101.2 F (38.4 C)  TempSrc:   Rectal Rectal  SpO2:  98% 100% 97%  Weight:      Height:       Body mass index is 29.84 kg/m.  Physical Exam Constitutional:      Comments: He is shivering.  He opens his eyes and looks toward me when I call his name.  Cardiovascular:     Rate and Rhythm: Regular rhythm. Tachycardia present.     Heart  sounds: No murmur heard.   Pulmonary:     Effort: Pulmonary effort is normal.     Breath sounds: Normal breath sounds.  Abdominal:     Palpations: Abdomen is soft.     Tenderness: There is no abdominal tenderness.  Skin:    Comments: Patchy psoriasis.     Lab Results Lab Results  Component Value Date   WBC 8.9 08/26/2019   HGB 11.7 (L) 08/26/2019   HCT 36.9 (L) 08/26/2019   MCV 96.9 08/26/2019   PLT 232 08/26/2019    Lab Results  Component Value Date   CREATININE 0.72 08/26/2019   BUN 14 08/26/2019   NA 138 08/26/2019   K 4.0 08/26/2019   CL 105 08/26/2019   CO2 25 08/26/2019    Lab Results  Component Value Date   ALT 41 08/24/2019   AST 24 08/24/2019   ALKPHOS 54 08/24/2019   BILITOT 0.7 08/24/2019     Microbiology: Recent Results (from the past 240  hour(s))  Culture, respiratory     Status: None   Collection Time: 08/22/19  3:11 PM   Specimen: Tracheal Aspirate  Result Value Ref Range Status   Specimen Description TRACHEAL ASPIRATE  Final   Special Requests NONE  Final   Gram Stain   Final    FEW WBC PRESENT, PREDOMINANTLY PMN FEW GRAM NEGATIVE RODS Performed at [facility], [address] [place], [place] [address]    Culture   Final    MODERATE HAEMOPHILUS INFLUENZAE BETA LACTAMASE POSITIVE FEW STAPHYLOCOCCUS AUREUS    Report Status 08/26/2019 FINAL  Final   Organism ID, Bacteria STAPHYLOCOCCUS AUREUS  Final      Susceptibility   Staphylococcus aureus - MIC*    CIPROFLOXACIN <=0.5 SENSITIVE Sensitive     ERYTHROMYCIN RESISTANT Resistant     GENTAMICIN <=0.5 SENSITIVE Sensitive     OXACILLIN <=0.25 SENSITIVE Sensitive     TETRACYCLINE <=1 SENSITIVE Sensitive     VANCOMYCIN 1 SENSITIVE Sensitive     TRIMETH/SULFA <=10 SENSITIVE Sensitive     CLINDAMYCIN RESISTANT Resistant     RIFAMPIN <=0.5 SENSITIVE Sensitive     Inducible Clindamycin POSITIVE Resistant     * FEW STAPHYLOCOCCUS AUREUS  Culture, blood (x 2)     Status: None   Collection Time: 08/22/19  3:32 PM   Specimen: BLOOD  Result Value Ref Range Status   Specimen Description BLOOD LEFT ANTECUBITAL  Final   Special Requests   Final    BOTTLES DRAWN AEROBIC AND ANAEROBIC Blood Culture results may not be optimal due to an excessive volume of blood received in culture bottles   Culture   Final    NO GROWTH 5 DAYS Performed at [facility], [place] [address], [place], [place] [address]    Report Status 08/27/2019 FINAL  Final  Culture, blood (x 2)     Status: None   Collection Time: 08/22/19  3:37 PM   Specimen: BLOOD  Result Value Ref Range Status   Specimen Description BLOOD LEFT ANTECUBITAL  Final   Special Requests   Final    AEROBIC BOTTLE ONLY Blood Culture results may not be optimal due to an excessive volume of blood received in culture  bottles   Culture   Final    NO GROWTH 5 DAYS Performed at [facility], [place] [address], [place], [place] [address]    Report Status 08/27/2019 FINAL  Final  MRSA PCR Screening  Status: None   Collection Time: 08/24/19  4:35 PM   Specimen: Nasal Mucosa; Nasopharyngeal  Result Value Ref Range Status   MRSA by PCR NEGATIVE NEGATIVE Final    Comment:        The GeneXpert MRSA Assay (FDA approved for NASAL specimens only), is one component of a comprehensive MRSA colonization surveillance program. It is not intended to diagnose MRSA infection nor to guide or monitor treatment for MRSA infections. Performed at [facility], [place] [address], [place], [place] [address]   CSF culture     Status: None (Preliminary result)   Collection Time: 08/27/19  7:35 PM   Specimen: CSF  Result Value Ref Range Status   Specimen Description CSF  Final   Special Requests NONE  Final   Gram Stain   Final    WBC PRESENT, PREDOMINANTLY PMN NO ORGANISMS SEEN CYTOSPIN SMEAR Performed at [facility], [place] [address], [place], [place] [address]    Culture PENDING  Incomplete   Report Status PENDING  Incomplete  Body fluid culture     Status: None (Preliminary result)   Collection Time: 08/27/19  7:35 PM   Specimen: Fluid  Result Value Ref Range Status   Specimen Description FLUID  Final   Special Requests INTRACRANIAL SUBGALEAL  Final   Gram Stain   Final    WBC PRESENT, PREDOMINANTLY PMN NO ORGANISMS SEEN CYTOSPIN SMEAR Performed at [facility], [place] [address] [place], [place] [address]    Culture PENDING  Incomplete   Report Status PENDING  Incomplete    Michel Bickers, MD [facility] for [facility]ious [place] [facility] [phone] pager   [phone] cell 08/28/2019, 10:13 AM

## 2019-08-29 LAB — PATHOLOGIST SMEAR REVIEW: Path Review: INCREASED

## 2019-08-29 NOTE — Progress Notes (Signed)
Palliative Medicine Inpatient Follow Up Note   Reason for consult:  Goals of Care "Goals of care. young patient. poor prognosis. hopeful family"  HPI:  Per intake H&P --> Lee Hooper is a 49 yo M who presented as a code stroke after being found aphasic and nonverbal by his wife. CT/CTA studies showed left ICA/MCA thrombosis and pt was taken for IR thrombectomy for left ICA and left M1 occlusions. Repeat head CT was obtained following the procedure out of concern for possible hemorrhage and showed moderate basal ganglia hemorrhage vs stroke. Repeat MRI revealed that the ICA and MCA re-occluded with change in FLAIR sequence excluding him from repeat thrombectomy. He underwent a L hemicraniectomy. He was intubated for airway protection, he later underwent a tracheostomy and g-tube placement.  Palliative care was consulted as Lee Hooper has been hospitalized for fifty-eight days without significant improvements. Asked to address goals of care with patients family given his poor overall condition and unlikely ability to recover.   Today's Discussion (08/29/2019): Chart reviewed. Touched base with patients bedside RN, Misty Stanley. Patient appears comfortable and in no distress upon morning examination.   Spoke to SW team, not yet a candidate for hospice home.   Decision Maker: Lee Hooper (Spouse) [phone]  SUMMARY OF RECOMMENDATIONS DNAR/DNI  Comfort care  Continue to provide ongoing support  Not yet a candidate for [facility]  Symptom management as below  Code Status/Advance Care Planning: DNAR/DNI  Symptom Management: Dyspnea: Pain:                 - Morphine 5mg  GT Q1H PRN                 - Morphine 1-4mg  IV Q1H PRN Fever:                 - Tylenol 650mg  GT Q6H PRN Agitation:                 - Haldol 0.5mg  PO/SL/IV Q4H PRN Anxiety:                 - Lorazepam 10.5-mg PO/SL/IV  Q1H PRN Nausea:                 - Zofran 4mg   PO/IV Q6H PRN                 -Phenergan for refractory nausea via GT                  Secretions:                 - Glycopyrrolate 0.4mg  IV Q4H PRN Dry Eyes:                 - Artificial Tears PRN Xerostomia:                 - Medline BID oral care Urinary Retention:                 - Maintain foley  Constipation:                 - Bisacodyl 10mg  PR PRN QDay Spiritual:                 - Chaplain consult  Total Time: 15 Greater than 50%  of this time was spent counseling and coordinating care related to the above assessment and plan.  [facility] Palliative Medicine Team Team Cell Phone:  [phone] Please utilize secure chat with additional questions, if there is no response within 30 minutes please call the above phone number  Palliative Medicine Team providers are available by phone from 7am to 7pm daily and can be reached through the team cell phone.  Should this patient require assistance outside of these hours, please call the patient's attending physician.

## 2019-08-29 NOTE — Progress Notes (Signed)
STROKE PROGRESS NOTE INTERVAL HISTORY No family at the bedside.  Patient lying in bed, on trach collar, eyes open on voice, moving left arm and leg spontaneously.  Temperature today 100.3.  Palliative care services involved yesterday and discussed his family, and the family decided on comfort care measures.  Currently patient on comfort care measures with morphine as needed.  Vitals:   08/29/19 0338 08/29/19 0822 08/29/19 0855 08/29/19 1202  BP:   (!) 159/83   Pulse: (!) 111 84 94 95  Resp: (!) 22 19 18 20   Temp:   100 F (37.8 C)   TempSrc:   Oral   SpO2: 99% 95%  95%  Weight:      Height:       CBC:  Recent Labs  Lab 08/25/19 0500 08/26/19 0405  WBC 8.7 8.9  NEUTROABS 6.1 6.3  HGB 11.9* 11.7*  HCT 38.0* 36.9*  MCV 97.4 96.9  PLT 205 232   Basic Metabolic Panel:  Recent Labs  Lab 08/24/19 0440 08/24/19 0440 08/25/19 0500 08/26/19 0405  NA 138   < > 141 138  K 3.6   < > 3.4* 4.0  CL 107   < > 108 105  CO2 22   < > 28 25  GLUCOSE 106*   < > 93 103*  BUN 13   < > 14 14  CREATININE 0.65   < > 0.72 0.72  CALCIUM 8.7*   < > 8.8* 8.9  MG 1.9  --   --   --   PHOS 2.6  --   --   --    < > = values in this interval not displayed.    IMAGING past 24 hours No results found.  PHYSICAL EXAM General- middle-aged Caucasian male, s/p tracheostomy on trach collar, lethargic but eyes open.   Ophthalmologic- fundi not visualized due to noncooperation.  Cardiovascular - Regular rate and rhythm.  Neuro - s/p tracheostomy on trach collar.  Eyes open but global aphasia, left gaze preference position, barely cross midline, notblinking to visual threat on the left, PERRL. Right facial droop. Tongue protrusion not cooperative. LUE spontaneous movement at least 4/5, LLE against gravity at least 3/5.  No spontaneous movement of right upper and lower extremity, does not seem to be developing rigidity or spasticity at this time. Sensation, coordination and gait not tested.     ASSESSMENT/PLAN [name] with history of COPD, ongoing tobacco use, and psoriasis, presenting with dense LMCA syndrome. Attempt for Left ICA and Left M1 thrombectomy was done emergently with TICI 2b flow. Unfortunately, the pt had post procedure hemorrhage and reocclusion of the vessels. F/u imaging suggested early edema and pt underwent prophylactic decompressive hemicraniectomy.  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 Stroke etiology unknown  Was on eliquis for VTE prophylaxis, now off  Left depressive hemicraniectomy on 07/01/19 Dr 07/03/19  No antithrombotic prior to admission, was on Eliquis, now off.  Disposition: Hospice Medical Facility  Palliative care on board - now on comfort care given poor quality of life  Respiratory failure s/p Trach Trach wound  Downsized to cuffless trach 6 by [facility] 5/24  On trach collar now  Fever, Tachycardia, Leukocytosis  possible intracranial infection ->sepsis  Temp - 103.2->102.9->102.2->100.3  Tachycardia - 127->133->130->86-> (metoprolol given by nursing per standing order) ->112->95  Known DVT - possible PE? - Chest CTA considered but pt already on Eliquis  IVC filter placed 07/12/19   Eliquis restarted 07/26/19 (5 mg Bid) -> now off for comfort care  ABGs 08/22/19 - WNL except PO2 - 69.3  (CMP - unremarkable)  ;  (UA - cloudy with rare bacteria)  ;  (CBC - WBCs 13.4->16.0...8.9)      CXR - 6/19 - Minimal bibasilar opacification which may be due to atelectasis or infection.  Blood cultures -  No growth < 24 hrs  Lactic Acid - 2.2->3.3->1.7  Procalcitonin < 0.10  Respiratory cultures - 6/19 - MODERATE HAEMOPHILUS INFLUENZAE  BETA LACTAMASE  POSITIVE  ; FEW STAPHYLOCOCCUS AUREUS  IM on board   Maxipime, Metronidazole and Vancomycin started 08/22/19 for Sepsis -> currently off all antibiotics for comfort care measures  Bilateral Lower Ext DVT, extensive Possible PE given tachycardia  Venous US 07/11/2019 DVT + bilateral extensive  IVC filter placed 5/9  Was on eliquis for DVT treatment and prophylaxis  Dysphagia   PEG placement 5/14  Was on bolus TF   MBSS cleared for D1 nectar thick liquids  SLP was following  Now on comfort care  Tobacco abuse  Smoker PTA  Other Stroke Risk Factors  Obesity, Body mass index is 29.84 kg/m., recommend weight loss, diet and exercise as appropriate    Likely undiagnosed obstructive sleep apnea based on body habitus  Other Active Problems  Na - 131->141->138   Hospital day # 75    Rosalin Hawking, MD PhD Stroke Neurology 08/29/2019 2:11 PM      To contact Stroke Continuity provider, please refer to [email or web address] After hours, contact General Neurology

## 2019-08-29 NOTE — TOC Progression Note (Signed)
Transition of Care [facility]) - Progression Note    Patient Details  Name: Lee Hooper MRN: [id number] Date of Birth: 11-26-1970  Transition of Care [facility] CM/SW Contact  Nonda Lou, [place] Phone Number: 08/29/2019, 11:05 AM  Clinical Narrative:    CSW spoke with Dennie Bible, liaison for [facility] [place]. Dennie Bible stated that although patient qualifies for hospice, he does not currently qualify for hospice home due to not having unmanaged symptoms. Dennie Bible has spoken with patient's wife to provide an update. Hospice will continue to follow for changes in disposition. CSW updated Palliative NP Marcelino Duster. TOC team will continue to follow and assist.  Expected Discharge Plan: Hospice Medical Facility Barriers to Discharge: Hospice Bed not available  Expected Discharge Plan and Services Expected Discharge Plan: Hospice Medical Facility   Discharge Planning Services: CM Consult   Living arrangements for the past 2 months: Single Family Home                                       Social Determinants of Health (SDOH) Interventions    Readmission Risk Interventions No flowsheet data found.

## 2019-08-29 NOTE — Progress Notes (Signed)
PROGRESS NOTE  Lee Hooper  DOB: 09/02/1970  PCP: Patient, No Pcp Per QAS:[id number]  DOA: 07/01/2019  LOS: 34 days   Chief Complaint  Patient presents with  . Code Stroke   Brief narrative: Lee Hooper is a 49 y.o. male , chronic daily smoker with COPD and psoriasis who was admitted on 4/28 primarily under neurology service.   Medicine consultation was: 6/19 for comanagement of medical issues.    08/29/2019: Patient seen.  No history from patient.  No new changes.  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  Underwent arteriogram with revascularization/thrombectomy but had rethrombosis and hence underwent L depressive hemicraniectomy.  He remained intubated in ICU postprocedure. Could not be extubated. 5/8 -trach placement.  Also found to have bilateral lower extremity DVT. 5/9 -IVC filter placement.  5/23 -Eliquis resumed for DVT 6/4 -required scalp wound revision on 6/4 and is planned for cranioplasty next week.  6/6 -transfered out of ICU.  6/17 -started on dysphagia diet. 6/19 -started having fever up to 103.2, tachycardia. Chest x-ray with possible aspiration.  Sepsis work-up started and hospitalist service was consulted. 6/23 -MRI brain obtained on 6/23 showed findings concerning for infection superimposed on infarcted tissue.  Subjective: Patient is not able to give any history.  Assessment/Plan: Sepsis likely secondary to intracranial infection -Unfortunate young man with catastrophic CVA in April, still recovering with dense R hemiparesis -6/19 -started having fever, tachycardia. -Initial suspicion was for aspiration pneumonia.  He was placed on broad-spectrum antibiotics despite which he continued to spike fever. -MRI brain obtained on 6/23 showed findings concerning for infection superimposed on infarcted tissue. -Palliative care consultation highly appreciated. Comfort care measures started. 08/29/2019: No new changes.   Patient is comfortable.  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  Underwent arteriogram with revascularization/thrombectomy but had rethrombosis and hence underwent L depressive hemicraniectomy.  -Trach/PEG in place  DVT -5/8 -extensive B LE DVT -5/9 -IVC filter placed  -5/23 -Eliquis initiated  Psoriasis -No apparent treatment as outpatient  Hyperglycemia - A1c 5.5 on 4/29 -Continue sliding scale with Accu-Cheks  Pressure injury -Stage 1 sacral ulcer, appears unrelated to current sepsis Pressure Injury 07/15/19 Other (Comment) Right Stage 3 -  Full thickness tissue loss. Subcutaneous fat may be visible but bone, tendon or muscle are NOT exposed. reddened, open indention under right side of tracheostomy (Active)  07/15/19 0330  Location: Other (Comment) (under tracheostomy)  Location Orientation: Right  Staging: Stage 3 -  Full thickness tissue loss. Subcutaneous fat may be visible but bone, tendon or muscle are NOT exposed.  Wound Description (Comments): reddened, open indention under right side of tracheostomy  Present on Admission: No    Nutrition Problem: Inadequate oral intake Etiology: inability to eat  Signs/Symptoms: NPO status  Infusions:    Scheduled Meds: . chlorhexidine gluconate (MEDLINE KIT)  15 mL Mouth Rinse BID  . mouth rinse  15 mL Mouth Rinse q12n4p    Antimicrobials: Anti-infectives (From admission, onward)   Start     Dose/Rate Route Frequency Ordered Stop   08/27/19 0000  vancomycin (VANCOREADY) IVPB 1250 mg/250 mL  Status:  Discontinued        1,250 mg 166.7 mL/hr over 90 Minutes Intravenous Every 8 hours 08/26/19 1701 08/28/19 1358   08/24/19 1610  vancomycin (VANCOREADY) IVPB 1500 mg/300 mL  Status:  Discontinued        1,500 mg 150 mL/hr over 120 Minutes Intravenous Every 12 hours  08/24/19 1610 08/26/19 1701   08/23/19 0400  vancomycin (VANCOREADY) IVPB 1250 mg/250 mL  Status:  Discontinued        1,250 mg 166.7 mL/hr  over 90 Minutes Intravenous Every 12 hours 08/22/19 1526 08/24/19 1610   08/22/19 1530  metroNIDAZOLE (FLAGYL) IVPB 500 mg  Status:  Discontinued        500 mg 100 mL/hr over 60 Minutes Intravenous Every 8 hours 08/22/19 1511 08/28/19 0918   08/22/19 1530  ceFEPIme (MAXIPIME) 2 g in sodium chloride 0.9 % 100 mL IVPB  Status:  Discontinued        2 g 200 mL/hr over 30 Minutes Intravenous Every 8 hours 08/22/19 1525 08/28/19 1358   08/22/19 1530  vancomycin (VANCOREADY) IVPB 1750 mg/350 mL        1,750 mg 175 mL/hr over 120 Minutes Intravenous  Once 08/22/19 1526 08/22/19 1814   08/22/19 1515  ceFEPIme (MAXIPIME) 2 g in sodium chloride 0.9 % 100 mL IVPB  Status:  Discontinued        2 g 200 mL/hr over 30 Minutes Intravenous  Once 08/22/19 1511 08/22/19 1525   08/22/19 1515  vancomycin (VANCOCIN) IVPB 1000 mg/200 mL premix  Status:  Discontinued        1,000 mg 200 mL/hr over 60 Minutes Intravenous  Once 08/22/19 1511 08/22/19 1525   07/28/19 0800  vancomycin (VANCOCIN) IVPB 1000 mg/200 mL premix  Status:  Discontinued        1,000 mg 200 mL/hr over 60 Minutes Intravenous Every 8 hours 07/27/19 2300 07/30/19 1522   07/27/19 2300  ceFEPIme (MAXIPIME) 2 g in sodium chloride 0.9 % 100 mL IVPB  Status:  Discontinued        2 g 200 mL/hr over 30 Minutes Intravenous Every 8 hours 07/27/19 2251 08/02/19 0927   07/27/19 2300  vancomycin (VANCOREADY) IVPB 2000 mg/400 mL        2,000 mg 200 mL/hr over 120 Minutes Intravenous  Once 07/27/19 2258 07/28/19 0130   07/27/19 2245  ceFEPIme (MAXIPIME) 2 g in sodium chloride 0.9 % 100 mL IVPB  Status:  Discontinued        2 g 200 mL/hr over 30 Minutes Intravenous Every 12 hours 07/27/19 2244 07/27/19 2251   07/17/19 1400  ceFAZolin (ANCEF) IVPB 2g/100 mL premix        2 g 200 mL/hr over 30 Minutes Intravenous Every 8 hours 07/17/19 0922 07/20/19 2213   07/16/19 1400  ceFEPIme (MAXIPIME) 2 g in sodium chloride 0.9 % 100 mL IVPB  Status:  Discontinued          2 g 200 mL/hr over 30 Minutes Intravenous Every 8 hours 07/16/19 1132 07/17/19 0922   07/16/19 1200  vancomycin (VANCOREADY) IVPB 1250 mg/250 mL  Status:  Discontinued        1,250 mg 166.7 mL/hr over 90 Minutes Intravenous Every 24 hours 07/15/19 1333 07/17/19 0923   07/15/19 2200  ceFEPIme (MAXIPIME) 2 g in sodium chloride 0.9 % 100 mL IVPB  Status:  Discontinued        2 g 200 mL/hr over 30 Minutes Intravenous Every 12 hours 07/15/19 1253 07/16/19 1132   07/15/19 1200  vancomycin (VANCOREADY) IVPB 1500 mg/300 mL  Status:  Discontinued        1,500 mg 150 mL/hr over 120 Minutes Intravenous Every 24 hours 07/14/19 1037 07/15/19 1334   07/14/19 1400  ceFEPIme (MAXIPIME) 2 g in sodium chloride 0.9 % 100 mL IVPB  Status:  Discontinued        2 g 200 mL/hr over 30 Minutes Intravenous Every 8 hours 07/14/19 1015 07/15/19 1253   07/14/19 1045  vancomycin (VANCOREADY) IVPB 1750 mg/350 mL        1,750 mg 175 mL/hr over 120 Minutes Intravenous  Once 07/14/19 1037 07/14/19 1455   07/08/19 1100  Ampicillin-Sulbactam (UNASYN) 3 g in sodium chloride 0.9 % 100 mL IVPB        3 g 200 mL/hr over 30 Minutes Intravenous Every 6 hours 07/08/19 1057 07/09/19 2319   07/07/19 2200  amoxicillin-clavulanate (AUGMENTIN) 875-125 MG per tablet 1 tablet  Status:  Discontinued        1 tablet Per Tube Every 12 hours 07/07/19 1546 07/08/19 1057   07/05/19 1500  amoxicillin-clavulanate (AUGMENTIN) 875-125 MG per tablet 1 tablet  Status:  Discontinued        1 tablet Per Tube Every 12 hours 07/05/19 1214 07/07/19 1546   07/03/19 1000  ceFEPIme (MAXIPIME) 2 g in sodium chloride 0.9 % 100 mL IVPB  Status:  Discontinued        2 g 200 mL/hr over 30 Minutes Intravenous Every 8 hours 07/03/19 0926 07/05/19 1214   07/01/19 0923  ceFAZolin (ANCEF) 2-4 GM/100ML-% IVPB       Note to Pharmacy: Domenick Bookbinder   : cabinet override      07/01/19 0923 07/01/19 2129      PRN meds: bisacodyl, fentaNYL (SUBLIMAZE) injection,  glycopyrrolate, ketorolac, LORazepam, morphine, morphine injection, ondansetron **OR** ondansetron (ZOFRAN) IV, polyvinyl alcohol, promethazine   Objective: Vitals:   08/29/19 0822 08/29/19 0855  BP:  (!) 159/83  Pulse: 84 94  Resp: 19 18  Temp:  100 F (37.8 C)  SpO2: 95%     Intake/Output Summary (Last 24 hours) at 08/29/2019 1135 Last data filed at 08/28/2019 2300 Gross per 24 hour  Intake --  Output 1700 ml  Net -1700 ml   Filed Weights   08/24/19 0500 08/25/19 0443 08/26/19 0600  Weight: 96.5 kg 96.3 kg 99.8 kg   Weight change:  Body mass index is 29.84 kg/m.   Physical Exam: General exam: Middle-aged Caucasian male.  Continues to look lethargic. Skin: No rashes, lesions or ulcers. HEENT: Left hemicraniectomy status.  Bulging on the left side of the head with herniation of intracranial contents.   Lungs: Clear to auscultation bilaterally.  Fair respiratory effort.  Has trach collar anteriorly in the neck CVS: Tachycardic, no murmur GI/Abd soft, nontender, nondistended, bowel sound present CNS: Alert, awake, followd verbal command to move the left side. Right dense hemiplegia. Psychiatry: Depressed look Extremities: No pedal edema, no calf tenderness  Data Review: I have personally reviewed the laboratory data and studies available.  Recent Labs  Lab 08/22/19 1255 08/22/19 1915 08/24/19 0440 08/25/19 0500 08/26/19 0405  WBC 13.4* 16.0* 12.5* 8.7 8.9  NEUTROABS  --   --  9.3* 6.1 6.3  HGB 14.2 15.7 12.0* 11.9* 11.7*  HCT 43.9 49.2 38.5* 38.0* 36.9*  MCV 93.8 96.1 97.7 97.4 96.9  PLT 225 242 164 205 232   Recent Labs  Lab 08/22/19 1255 08/22/19 1915 08/24/19 0440 08/25/19 0500 08/26/19 0405  NA 134* 131* 138 141 138  K 3.8 4.3 3.6 3.4* 4.0  CL 100 97* 107 108 105  CO2 24 24 22 28 25   GLUCOSE 145* 150* 106* 93 103*  BUN 23* 21* 13 14 14   CREATININE 0.84 0.87 0.65 0.72 0.72  CALCIUM  9.3 9.2 8.7* 8.8* 8.9  MG  --   --  1.9  --   --   PHOS  --   --   2.6  --   --    Recent Labs  Lab 08/22/19 1537 08/22/19 1915 08/22/19 2330  LATICACIDVEN 2.2* 3.3* 1.7  PROCALCITON <0.10  --   --      Signed, Bonnell Public, MD [facility] 08/29/2019

## 2019-08-30 MED ORDER — ACETAMINOPHEN 160 MG/5ML PO SOLN: 650.0000 mg | Freq: Four times a day (QID) | ORAL | Status: DC | PRN

## 2019-08-30 MED ORDER — HALOPERIDOL LACTATE 5 MG/ML IJ SOLN: 0.5000 mg | INTRAMUSCULAR | Status: DC | PRN

## 2019-08-30 MED ORDER — HALOPERIDOL LACTATE 2 MG/ML PO CONC
0.5000 mg | ORAL | Status: DC | PRN
  Filled 2019-08-30: qty 0.3

## 2019-08-30 MED ORDER — HALOPERIDOL 0.5 MG PO TABS: 0.5000 mg | ORAL_TABLET | ORAL | Status: DC | PRN

## 2019-08-30 NOTE — Progress Notes (Signed)
PROGRESS NOTE  Lee Hooper  DOB: 08-Jun-1970  PCP: Patient, No Pcp Per KLK:[id number]  DOA: 07/01/2019  LOS: 60 days   Chief Complaint  Patient presents with  . Code Stroke   Brief narrative: Lee Hooper is a 49 y.o. male , chronic daily smoker with COPD and psoriasis who was admitted on 4/28 primarily under neurology service.   Medicine consultation was: 6/19 for comanagement of medical issues.    08/30/2019: Patient seen.  No history from patient.  No new changes.  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  Underwent arteriogram with revascularization/thrombectomy but had rethrombosis and hence underwent L depressive hemicraniectomy.  He remained intubated in ICU postprocedure. Could not be extubated. 5/8 -trach placement.  Also found to have bilateral lower extremity DVT. 5/9 -IVC filter placement.  5/23 -Eliquis resumed for DVT 6/4 -required scalp wound revision on 6/4 and is planned for cranioplasty next week.  6/6 -transfered out of ICU.  6/17 -started on dysphagia diet. 6/19 -started having fever up to 103.2, tachycardia. Chest x-ray with possible aspiration.  Sepsis work-up started and hospitalist service was consulted. 6/23 -MRI brain obtained on 6/23 showed findings concerning for infection superimposed on infarcted tissue.  Subjective: Patient is not able to give any history.  Assessment/Plan: Sepsis likely secondary to intracranial infection -Unfortunate young man with catastrophic CVA in April, still recovering with dense R hemiparesis -6/19 -started having fever, tachycardia. -Initial suspicion was for aspiration pneumonia.  He was placed on broad-spectrum antibiotics despite which he continued to spike fever. -MRI brain obtained on 6/23 showed findings concerning for infection superimposed on infarcted tissue. -Palliative care consultation highly appreciated. Comfort care measures started. 08/29/2019: No new changes.   Patient is comfortable.  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  Underwent arteriogram with revascularization/thrombectomy but had rethrombosis and hence underwent L depressive hemicraniectomy.  -Trach/PEG in place  DVT -5/8 -extensive B LE DVT -5/9 -IVC filter placed  -5/23 -Eliquis initiated  Psoriasis -No apparent treatment as outpatient  Hyperglycemia - A1c 5.5 on 4/29 -Continue sliding scale with Accu-Cheks  Pressure injury -Stage 1 sacral ulcer, appears unrelated to current sepsis Pressure Injury 07/15/19 Other (Comment) Right Stage 3 -  Full thickness tissue loss. Subcutaneous fat may be visible but bone, tendon or muscle are NOT exposed. reddened, open indention under right side of tracheostomy (Active)  07/15/19 0330  Location: Other (Comment) (under tracheostomy)  Location Orientation: Right  Staging: Stage 3 -  Full thickness tissue loss. Subcutaneous fat may be visible but bone, tendon or muscle are NOT exposed.  Wound Description (Comments): reddened, open indention under right side of tracheostomy  Present on Admission: No    Nutrition Problem: Inadequate oral intake Etiology: inability to eat  Signs/Symptoms: NPO status  Infusions:    Scheduled Meds: . chlorhexidine gluconate (MEDLINE KIT)  15 mL Mouth Rinse BID  . mouth rinse  15 mL Mouth Rinse q12n4p    Antimicrobials: Anti-infectives (From admission, onward)   Start     Dose/Rate Route Frequency Ordered Stop   08/27/19 0000  vancomycin (VANCOREADY) IVPB 1250 mg/250 mL  Status:  Discontinued        1,250 mg 166.7 mL/hr over 90 Minutes Intravenous Every 8 hours 08/26/19 1701 08/28/19 1358   08/24/19 1610  vancomycin (VANCOREADY) IVPB 1500 mg/300 mL  Status:  Discontinued        1,500 mg 150 mL/hr over 120 Minutes Intravenous Every 12 hours  08/24/19 1610 08/26/19 1701   08/23/19 0400  vancomycin (VANCOREADY) IVPB 1250 mg/250 mL  Status:  Discontinued        1,250 mg 166.7 mL/hr  over 90 Minutes Intravenous Every 12 hours 08/22/19 1526 08/24/19 1610   08/22/19 1530  metroNIDAZOLE (FLAGYL) IVPB 500 mg  Status:  Discontinued        500 mg 100 mL/hr over 60 Minutes Intravenous Every 8 hours 08/22/19 1511 08/28/19 0918   08/22/19 1530  ceFEPIme (MAXIPIME) 2 g in sodium chloride 0.9 % 100 mL IVPB  Status:  Discontinued        2 g 200 mL/hr over 30 Minutes Intravenous Every 8 hours 08/22/19 1525 08/28/19 1358   08/22/19 1530  vancomycin (VANCOREADY) IVPB 1750 mg/350 mL        1,750 mg 175 mL/hr over 120 Minutes Intravenous  Once 08/22/19 1526 08/22/19 1814   08/22/19 1515  ceFEPIme (MAXIPIME) 2 g in sodium chloride 0.9 % 100 mL IVPB  Status:  Discontinued        2 g 200 mL/hr over 30 Minutes Intravenous  Once 08/22/19 1511 08/22/19 1525   08/22/19 1515  vancomycin (VANCOCIN) IVPB 1000 mg/200 mL premix  Status:  Discontinued        1,000 mg 200 mL/hr over 60 Minutes Intravenous  Once 08/22/19 1511 08/22/19 1525   07/28/19 0800  vancomycin (VANCOCIN) IVPB 1000 mg/200 mL premix  Status:  Discontinued        1,000 mg 200 mL/hr over 60 Minutes Intravenous Every 8 hours 07/27/19 2300 07/30/19 1522   07/27/19 2300  ceFEPIme (MAXIPIME) 2 g in sodium chloride 0.9 % 100 mL IVPB  Status:  Discontinued        2 g 200 mL/hr over 30 Minutes Intravenous Every 8 hours 07/27/19 2251 08/02/19 0927   07/27/19 2300  vancomycin (VANCOREADY) IVPB 2000 mg/400 mL        2,000 mg 200 mL/hr over 120 Minutes Intravenous  Once 07/27/19 2258 07/28/19 0130   07/27/19 2245  ceFEPIme (MAXIPIME) 2 g in sodium chloride 0.9 % 100 mL IVPB  Status:  Discontinued        2 g 200 mL/hr over 30 Minutes Intravenous Every 12 hours 07/27/19 2244 07/27/19 2251   07/17/19 1400  ceFAZolin (ANCEF) IVPB 2g/100 mL premix        2 g 200 mL/hr over 30 Minutes Intravenous Every 8 hours 07/17/19 0922 07/20/19 2213   07/16/19 1400  ceFEPIme (MAXIPIME) 2 g in sodium chloride 0.9 % 100 mL IVPB  Status:  Discontinued          2 g 200 mL/hr over 30 Minutes Intravenous Every 8 hours 07/16/19 1132 07/17/19 0922   07/16/19 1200  vancomycin (VANCOREADY) IVPB 1250 mg/250 mL  Status:  Discontinued        1,250 mg 166.7 mL/hr over 90 Minutes Intravenous Every 24 hours 07/15/19 1333 07/17/19 0923   07/15/19 2200  ceFEPIme (MAXIPIME) 2 g in sodium chloride 0.9 % 100 mL IVPB  Status:  Discontinued        2 g 200 mL/hr over 30 Minutes Intravenous Every 12 hours 07/15/19 1253 07/16/19 1132   07/15/19 1200  vancomycin (VANCOREADY) IVPB 1500 mg/300 mL  Status:  Discontinued        1,500 mg 150 mL/hr over 120 Minutes Intravenous Every 24 hours 07/14/19 1037 07/15/19 1334   07/14/19 1400  ceFEPIme (MAXIPIME) 2 g in sodium chloride 0.9 % 100 mL IVPB  Status:  Discontinued        2 g 200 mL/hr over 30 Minutes Intravenous Every 8 hours 07/14/19 1015 07/15/19 1253   07/14/19 1045  vancomycin (VANCOREADY) IVPB 1750 mg/350 mL        1,750 mg 175 mL/hr over 120 Minutes Intravenous  Once 07/14/19 1037 07/14/19 1455   07/08/19 1100  Ampicillin-Sulbactam (UNASYN) 3 g in sodium chloride 0.9 % 100 mL IVPB        3 g 200 mL/hr over 30 Minutes Intravenous Every 6 hours 07/08/19 1057 07/09/19 2319   07/07/19 2200  amoxicillin-clavulanate (AUGMENTIN) 875-125 MG per tablet 1 tablet  Status:  Discontinued        1 tablet Per Tube Every 12 hours 07/07/19 1546 07/08/19 1057   07/05/19 1500  amoxicillin-clavulanate (AUGMENTIN) 875-125 MG per tablet 1 tablet  Status:  Discontinued        1 tablet Per Tube Every 12 hours 07/05/19 1214 07/07/19 1546   07/03/19 1000  ceFEPIme (MAXIPIME) 2 g in sodium chloride 0.9 % 100 mL IVPB  Status:  Discontinued        2 g 200 mL/hr over 30 Minutes Intravenous Every 8 hours 07/03/19 0926 07/05/19 1214   07/01/19 0923  ceFAZolin (ANCEF) 2-4 GM/100ML-% IVPB       Note to Pharmacy: Domenick Bookbinder   : cabinet override      07/01/19 0923 07/01/19 2129      PRN meds: acetaminophen (TYLENOL) oral liquid 160  mg/5 mL, bisacodyl, fentaNYL (SUBLIMAZE) injection, glycopyrrolate, haloperidol, haloperidol **OR** haloperidol lactate, LORazepam, morphine, morphine injection, ondansetron **OR** ondansetron (ZOFRAN) IV, polyvinyl alcohol, promethazine   Objective: Vitals:   08/30/19 1203 08/30/19 1503  BP:    Pulse: 100 96  Resp: 18 17  Temp:    SpO2: 91% 93%    Intake/Output Summary (Last 24 hours) at 08/30/2019 1732 Last data filed at 08/30/2019 0900 Gross per 24 hour  Intake --  Output 800 ml  Net -800 ml   Filed Weights   08/24/19 0500 08/25/19 0443 08/26/19 0600  Weight: 96.5 kg 96.3 kg 99.8 kg   Weight change:  Body mass index is 29.84 kg/m.   Physical Exam: General exam: Middle-aged Caucasian male.  Continues to look lethargic. Skin: No rashes, lesions or ulcers. HEENT: Left hemicraniectomy status.  Bulging on the left side of the head with herniation of intracranial contents.   Lungs: Clear to auscultation bilaterally.  Fair respiratory effort.  Has trach collar anteriorly in the neck CVS: Tachycardic, no murmur GI/Abd soft, nontender, nondistended, bowel sound present CNS: Alert, awake, followd verbal command to move the left side. Right dense hemiplegia. Psychiatry: Depressed look Extremities: No pedal edema, no calf tenderness  Data Review: I have personally reviewed the laboratory data and studies available.  Recent Labs  Lab 08/24/19 0440 08/25/19 0500 08/26/19 0405  WBC 12.5* 8.7 8.9  NEUTROABS 9.3* 6.1 6.3  HGB 12.0* 11.9* 11.7*  HCT 38.5* 38.0* 36.9*  MCV 97.7 97.4 96.9  PLT 164 205 232   Recent Labs  Lab 08/24/19 0440 08/25/19 0500 08/26/19 0405  NA 138 141 138  K 3.6 3.4* 4.0  CL 107 108 105  CO2 22 28 25   GLUCOSE 106* 93 103*  BUN 13 14 14   CREATININE 0.65 0.72 0.72  CALCIUM 8.7* 8.8* 8.9  MG 1.9  --   --   PHOS 2.6  --   --    No results for input(s): LATICACIDVEN, PROCALCITON in the  last 168 hours.   Signed, Bonnell Public, MD [facility] 08/30/2019

## 2019-08-30 NOTE — Consult Note (Signed)
Responded to consult, pt asleep, prayed for pt. Please call again if pt later desires further support from chaplain.  Rev. Donnel Saxon Chaplain

## 2019-08-30 NOTE — Progress Notes (Signed)
Palliative Medicine Inpatient Follow Up Note   Reason for consult:  Goals of Care "Goals of care. young patient. poor prognosis. hopeful family"  HPI:  Per intake H&P --> Lee Hooper is a 49 yo M who presented as a code stroke after being found aphasic and nonverbal by his wife. CT/CTA studies showed left ICA/MCA thrombosis and pt was taken for IR thrombectomy for left ICA and left M1 occlusions. Repeat head CT was obtained following the procedure out of concern for possible hemorrhage and showed moderate basal ganglia hemorrhage vs stroke. Repeat MRI revealed that the ICA and MCA re-occluded with change in FLAIR sequence excluding him from repeat thrombectomy. He underwent a L hemicraniectomy. He was intubated for airway protection, he later underwent a tracheostomy and g-tube placement.  Palliative care was consulted as [name] has been hospitalized for fifty-eight days without significant improvements. Asked to address goals of care with patients family given his poor overall condition and unlikely ability to recover.   Today's Discussion (08/30/2019): Chart reviewed. Patient assessed at bedside he was noted to be quite comfortable and in no distress.  Patient wife Lee Hooper called to provide an update, all questions answered  Spoke to SW team, not yet a candidate for hospice home.   Decision Maker: Lee Hooper (Spouse) [phone]  SUMMARY OF RECOMMENDATIONS DNAR/DNI  Comfort care  Continue to provide ongoing support  Not yet a candidate for [facility]  Symptom management as below  Code Status/Advance Care Planning: DNAR/DNI  Symptom Management: Dyspnea: Pain:                 - Morphine 5mg  GT Q1H PRN                 - Morphine 1-4mg  IV Q1H PRN Fever:                 - Tylenol 650mg  GT Q6H PRN Agitation:                 - Haldol 0.5mg  PO/SL/IV Q4H PRN Anxiety:                 - Lorazepam 10.5-mg PO/SL/IV  Q1H  PRN Nausea:                 - Zofran 4mg  PO/IV Q6H PRN                 -Phenergan for refractory nausea via GT                  Secretions:                 - Glycopyrrolate 0.4mg  IV Q4H PRN Dry Eyes:                 - Artificial Tears PRN Xerostomia:                 - Medline BID oral care Urinary Retention:                 - Maintain foley  Constipation:                 - Bisacodyl 10mg  PR PRN QDay Spiritual:                 - Chaplain consult  Total Time: 15 Greater than 50%  of this time was spent counseling and coordinating care related to the above assessment and plan.   [place] Team Team Cell Phone: [phone] Please utilize secure chat with additional questions, if there is no response within 30 minutes please call the above phone number  Palliative Medicine Team providers are available by phone from 7am to 7pm daily and can be reached through the team cell phone.  Should this patient require assistance outside of these hours, please call the patient's attending physician.

## 2019-08-30 NOTE — Progress Notes (Signed)
STROKE PROGRESS NOTE INTERVAL HISTORY No family at the bedside.  Patient lying in bed, on trach collar, eyes closed but easily open on voice, moving left arm and leg spontaneously.  No fever today, still has small amount of yellow secretion from left crani wound.  Vital stable. Pending transition to residential hospice.  Vitals:   08/30/19 0010 08/30/19 0323 08/30/19 0724 08/30/19 0910  BP:    133/80  Pulse: 96 98 85 91  Resp: 14 16 16 14   Temp:    98 F (36.7 C)  TempSrc:    Oral  SpO2: 93% 94% 90%   Weight:      Height:       CBC:  Recent Labs  Lab 08/25/19 0500 08/26/19 0405  WBC 8.7 8.9  NEUTROABS 6.1 6.3  HGB 11.9* 11.7*  HCT 38.0* 36.9*  MCV 97.4 96.9  PLT 205 096   Basic Metabolic Panel:  Recent Labs  Lab 08/24/19 0440 08/24/19 0440 08/25/19 0500 08/26/19 0405  NA 138   < > 141 138  K 3.6   < > 3.4* 4.0  CL 107   < > 108 105  CO2 22   < > 28 25  GLUCOSE 106*   < > 93 103*  BUN 13   < > 14 14  CREATININE 0.65   < > 0.72 0.72  CALCIUM 8.7*   < > 8.8* 8.9  MG 1.9  --   --   --   PHOS 2.6  --   --   --    < > = values in this interval not displayed.    IMAGING past 24 hours No results found.  PHYSICAL EXAM General- middle-aged Caucasian male, s/p tracheostomy on trach collar, lethargic but eyes open.   Ophthalmologic- fundi not visualized due to noncooperation.  Cardiovascular - Regular rate and rhythm.  Neuro - s/p tracheostomy on trach collar.  Eyes open but global aphasia, left gaze preference position, barely cross midline, notblinking to visual threat on the left, PERRL. Right facial droop. Tongue protrusion not cooperative. LUE spontaneous movement at least 4/5, LLE against gravity at least 3/5.  No spontaneous movement of right upper and lower extremity, does not seem to be developing rigidity or spasticity at this time. Sensation, coordination and gait not tested.    ASSESSMENT/PLAN Lee Hooper is a 49 y.o. male with history  of COPD, ongoing tobacco use, and psoriasis, presenting with dense LMCA syndrome. Attempt for Left ICA and Left M1 thrombectomy was done emergently with TICI 2b flow. Unfortunately, the pt had post procedure hemorrhage and reocclusion of the vessels. F/u imaging suggested early edema and pt underwent prophylactic decompressive hemicraniectomy.  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 Stroke etiology unknown  Was on eliquis for VTE prophylaxis, now off  Left depressive hemicraniectomy on 07/01/19 Dr Christella Noa  No antithrombotic prior to admission, was on Eliquis, now off.  Disposition: [place]  Palliative care on board - now on comfort care given poor quality of life  Respiratory failure s/p Trach Trach wound  Downsized to cuffless trach 6 by [facility] 5/24  On trach collar now  Fever, Tachycardia, Leukocytosis   possible intracranial infection ->sepsis  Temp - 103.2->102.9->102.2->100.3->98  Tachycardia - 127->133->130->86-> (metoprolol given by nursing per standing order) ->112->95 -> improved 80's - 90's  Known DVT - possible PE? - Chest CTA considered but pt is already on Eliquis  IVC filter placed 07/12/19   Eliquis restarted 07/26/19 (5 mg Bid) -> now off for comfort care  ABGs 08/22/19 - WNL except PO2 - 69.3  (CMP - unremarkable)  ;  (UA - cloudy with rare bacteria)  ;  (CBC - WBCs 13.4->16.0...8.9)      CXR - 6/19 - Minimal bibasilar opacification which may be due to atelectasis or infection.  Blood cultures -  No growth < 24 hrs  Lactic Acid - 2.2->3.3->1.7  Procalcitonin < 0.10  Respiratory cultures - 6/19 - MODERATE HAEMOPHILUS INFLUENZAE  BETA LACTAMASE POSITIVE  ; FEW STAPHYLOCOCCUS AUREUS  IM  on board   Maxipime, Metronidazole and Vancomycin started 08/22/19 for Sepsis -> currently off all antibiotics for comfort care measures  Bilateral Lower Ext DVT, extensive Possible PE given tachycardia  Venous US 07/11/2019 DVT + bilateral extensive  IVC filter placed 5/9  Was on eliquis for DVT treatment and prophylaxis  Now off for comfort care  Dysphagia   PEG placement 5/14  Was on bolus TF   MBSS cleared for D1 nectar thick liquids  SLP was following  Now on comfort care  Tobacco abuse  Smoker PTA  Other Stroke Risk Factors  Obesity, Body mass index is 29.84 kg/m., recommend weight loss, diet and exercise as appropriate    Likely undiagnosed obstructive sleep apnea based on body habitus  Other Active Problems  Na - 131->141->138  Now full comfort care - DNR - appreciate assistance from Palliative Care - pending transfer to residential hospice.   Hospital day # 60   Marvel Plan, MD PhD Stroke Neurology 08/30/2019 1:08 PM   To contact Stroke Continuity provider, please refer to [email or web address]. After hours, contact General Neurology

## 2019-08-30 NOTE — TOC Progression Note (Signed)
Transition of Care [facility]) - Progression Note    Patient Details  Name: Lee Hooper MRN: [id number] Date of Birth: 11/05/1970  [facility] CM/SW Contact  Nonda Lou, [place] Phone Number: 08/30/2019, 4:21 PM  Clinical Narrative:    CSW followed up with MD for an update on patient's medical status. CSW informed there is not much change from yesterday, aside from oxygen sat being overall lower. CSW provided MD's update to [facility] with [facility]. Dennie Bible agreed to continue to follow for a change that will qualify for placement. CSW updated MD and palliative that placement will not take place today. TOC team will continue to follow.    Expected Discharge Plan: Hospice Medical Facility Barriers to Discharge: Hospice Bed not available  Expected Discharge Plan and Services Expected Discharge Plan: Hospice Medical Facility   Discharge Planning Services: CM Consult   Living arrangements for the past 2 months: Single Family Home                                       Social Determinants of Health (SDOH) Interventions    Readmission Risk Interventions No flowsheet data found.

## 2019-08-31 DIAGNOSIS — R5081 Fever presenting with conditions classified elsewhere: Secondary | ICD-10-CM

## 2019-08-31 LAB — BODY FLUID CULTURE: Culture: NO GROWTH

## 2019-08-31 NOTE — Progress Notes (Signed)
NAME:  Lee Hooper, MRN:  [id number], DOB:  [birth year], LOS: 61 ADMISSION DATE:  07/01/2019, CONSULTATION DATE:  07/01/19 REFERRING MD:  Aroor MD, CHIEF COMPLAINT:  CVA    Brief History   Lee Hooper is a 49 yo M who presented as a code stroke after being found aphasic and nonverbal by his wife. CT/CTA studies showed left ICA/MCA thrombosis and pt was taken for IR thrombectomy for left ICA and left M1 occlusions. Repeat head CT was obtained following the procedure out of concern for possible hemorrhage and showed moderate basal ganglia hemorrhage vs stroke. Repeat MRI revealed that the ICA and MCA re-occluded with change in FLAIR sequence excluding him from repeat thrombectomy. He underwent a L hemicraniectomy. He was intubated for airway protection.  Past Medical History  Psoriasis  COPD  Tobacco Use  Significant Hospital Events   4/28: Admit; L ICA/MCA Thrombectomy with re-occlusion --> left hemicraniectomy, intubated for acute hypoxic respiratory failure  5/3: NAE. Was taken off precedex yesterday and pt opened his eyes. Prednisone 50 mg qd was begun. He had a persistent fever around 38.2, HR 110s, BP 140s/60s. Pt required increased O2 with secretions; CXR showed mild edema with small effusions c/f aspiration vs opacification. Blood culture x2 and urine culture collected, NGTD. ABG pending. Currently satting 92% on PRVC FiO2 80, PEEP 10, Plateau 22. WBC declined to 10.5 from 11.1, Na 151 from 156. Tube feeds were held d/t distended colonic loop in RLQ on KUB, to be continued today. Thrombocytopenia improved to 93 from 81.  5/4: Patient remains intubated with spontaneous movement of left arm and leg.  5/5: NAE. Pt remains intubated on PRN fetanyl. Will open eyes and spontaneously move left leg and arm.  5/6: NAE overnight. Intubated with no sedation. 5/7: NAE. Marland Kitchen Completed Unasyn yesterday. VSS, spO2 remains in low 90s the majority of the time on 620/40%/+5. Morning labs significant  for HCO3 of 19. Otherwise unremarkable.  5/8: Ongoing fever.  Obtained ultrasound, positive for lower extremity DVT bilaterally.  Tracheostomy placed at bedside.,  Did have some SVT this responded to treating fever and rate control. 5/9: IVC filter placed 5/11: no acute events overnight. Pt with similar exam as previous however tachycardic and febrile (tmax102.7) with a bit increased wbc and new R infiltrate noted in base. Pan cx and start empiric abx. 5/12 Tolerated 8 hours of trach collar  5/17 completed antibiotics, added IV Lasix, for ongoing high supplemental oxygen requirements.  Because of this we delayed transfer out 5/18 improved oxygenation, improved x-ray.  Weaning oxygen.  Transitioning to aerosol trach collar 5/18 to 5/24: making slow progress on I&O balance but oxygen requirements much improved. DOAC started 5/23 w/ no new issues. Will ask RT to change to cuffless.  Consults:  IR PCCM  Neuro (Primary) Neurosurgery  Procedures:  4/28 IR - L ICA/MCA Thrombectomy 4/28 L hemicraniectomy 4/28 Intubation 5/08 Trach   Significant Diagnostic Tests:  4/28 Admit CT Head Code Stroke:  Hyperdense left ICA and MCA compatible with acute thrombus. No acute infarct or hemorrhage.  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 The patient has minimal atherosclerotic disease. The left internal carotid artery is occluded through the terminus and extending in the left M1 M2 and M3 branches. There is poor collateral circulation on the left. There is a large territory infarct involving the left hemisphere. Infarct volume 266 mL.  4/28 Admit  MR Brain:  Restricted diffusion throughout much of the left MCA vascular territory consistent with acute ischemia. Restricted diffusion consistent with acute ischemia is also present within the paramedian left frontoparietal lobes, although this is less well assessed  due to the degree of motion degradation at the level of the vertex. There is little if any corresponding T2/FLAIR hyperintensity at these sites. No significant mass effect. No midline shift.  4/28 Post Thrombectomy CT Head: : 1. 3.0 x 2.6 x 3.9 cm region of hyperdensity centered within the left basal ganglia, left subinsular region and inferomedial left temporal lobe likely reflecting a combination of parenchymal hematoma and contrast staining.  2. Edema with loss of gray-white differentiation within the left basal ganglia, left insula and anterior left temporal lobe, likely acute infarction. Subtle changes of acute infarction are also suspected within the paramedian left frontal lobe ACA vascular territory. 3. Scattered small-volume subarachnoid hemorrhage along the left cerebral hemisphere. 4. Regional mass effect with effacement of the left lateral ventricle temporal horn. No midline shift.  4/28 Post Thrombectomy MR Brain:  1. Restricted diffusion throughout the majority of the left MCA and ACA vascular territories consistent with acute ischemia. 2. No significant mass effect at this time. No midline shift. 3. The acute parenchymal hemorrhage and/or contrast staining centered within left basal ganglia and inferomedial left temporal lobe on prior head CT does not appear significantly changed. Based on the MR appearance, it is suspected that a significant component of this previously demonstrated hyperdensity reflects contrast staining. 4. Redemonstrated small volume subarachnoid hemorrhage overlying the left cerebral hemisphere. Small volume subarachnoid hemorrhage is also questioned along the right cerebral hemisphere posteriorly.  4/29 Post Hemicraniectomy CT head:  Complete left ACA/MCA territory infarction with swollen brain bulging through craniectomy defect. No midline shift or entrapment. Petechial hemorrhage at left basal ganglia. Extraaxial hemorrhage along surface of the infarct.    5/1 Head CT > Slightly increased rightward midline shift with a small amount of herniation beneath the anterior falx. Otherwise unchanged Examination.  5/6 Head CT: Left ACA and MCA territory infarct with unchanged degree of subarachnoid hemorrhage. Swelling has progressed with greater herniation through the craniectomy defect and 5 mm of midline shift.  5/8: Bilateral acute DVT involving the right common femoral vein right proximal profundal vein and right popliteal vein also left DVT involving same vessels  5/12 Renal US: Technically challenging exam due to patient's intubated status and difficulty with repositioning.  Diffusely increased cortical echogenicity in both kidneys compatible with medical renal disease.  Increased echogenicity of the liver with diminished through transmission, most often reflective of hepatic steatosis.  5/20 Ct H> significant motion artifact. Continued low density and swelling of L hemisphere in L ACA and MCA idstributions. Areas of petechial hemorrhage, no large hematoma. L craniectomy w bulging of brain through defect. Reduced mass effect with L to R shift now 69mm from 61mm previously   Micro Data:  4/28: SARS-CoV-2/Influenza A/Influenza B PCR: Negative  4/30: Respiratory culture: Abundant Moraxella Catarrhalis, Haemophilus Influenzae, beta lactamase positive  5/2: Urine culture: NGTD  5/2: BC x2: NGTD 5/7 resp >> H flu 5/7 bld >> negative 5/11 BCx2 >> negative 5/11 trach asp >> E coli 6/19 trach asp >> H. Influ, staph a.   Antimicrobials:  4/30 - 5/2: Cefepime 5/2 - 5/4: Augmentin  5/5 - 5/6 unasyn   Vanc 5/11 >> 5/13, 6/19>> Cefepime 5/11 >> 5/13, 6/19>> Ancef 5/14 >> 5/17 Metronidazole 6/19>>  Interim history/subjective:   Still with some  secretions, appears stable  Objective   Blood pressure 107/75, pulse 83, temperature 98.3 F (36.8 C), temperature source Axillary, resp. rate 16, height 6' (1.829 m), weight 99.8 kg, SpO2 96 %.     FiO2 (%):  [28 %] 28 %   Intake/Output Summary (Last 24 hours) at 08/31/2019 1342 Last data filed at 08/31/2019 0332 Gross per 24 hour  Intake --  Output 1250 ml  Net -1250 ml   Filed Weights   08/24/19 0500 08/25/19 0443 08/26/19 0600  Weight: 96.5 kg 96.3 kg 99.8 kg    Examination: 49 year old male, awake, does not follow commands Janina Mayo is in place.  Mild amount of secretions. Heart sounds are regular regular rate rhythm sinus rhythm PEG in place Extremities warm and dry  Resolved Hospital Problem list   treated HCAP with Moraxella and Haemophilus influenza, these were beta-lactamase positive, antibiotics were completed 5/6; COPD exacerbation. Ileus, AKI, hypernatremia Completed E. coli HCAP treatment on 5/18  Assessment & Plan:   Acute hypoxic respiratory failure with compromised airway. Failure to wean s/p tracheostomy. Hx of smoking with presumed COPD. Large Lt ICA/MCA CVA s/p thrombectomy and decompressive hemicraniectomy 2nd to cerebral edema and hemorrhage. Mild hypernatremia-->improved w/ Free water replacement  B/l lower extremity DVT.- s/p IVC filter 5/09, now on [facility] SVT. Fever resolved, off antibiotics  Plan Unremarkable chest x-ray Trach care Change trach in future PCCM follow weekly      Best practice:  Diet: EN  DVT prophylaxis:  IVC filter 5/9, anticoag w Eliquis GI prophylaxis: Protonix Mobility: PT/ OT Code Status: FULL Disposition: Neuro floor, plan for skilled nursing facility  Virl Diamond, MD [place] PCCM Pager: [phone]

## 2019-08-31 NOTE — Progress Notes (Signed)
[facility]: [employer]  Spoke to pt's wife and confirmed interest as well as acceptance of full comfort care. She verbalizes and agrees to his DNR status. Spoke to our doctor about his care and she has agreed to take the pt for hospice care at [facility] in [facility].    She will come by the office to sign paperwork.   Norm Parcel RN

## 2019-08-31 NOTE — Progress Notes (Signed)
PTAR at bedside, here to transport pt to facility. Pt transported via stretcher. Belongings gathered, paperwork with transport team.

## 2019-08-31 NOTE — Progress Notes (Signed)
Report given to Annice Pih, Charity fundraiser at [facility] in [facility].

## 2019-08-31 NOTE — Discharge Summary (Signed)
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)   Leg DVT (deep venous thromboembolism), acute, bilateral (HCC)   Tracheostomy status (HCC)   Pressure injury of skin   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)   Chronic obstructive pulmonary disease (HCC)   Psoriasis   Dysphagia, post-stroke   Hyperglycemia   PEG (percutaneous endoscopic gastrostomy) status (HCC)   Sepsis (HCC)   Fever   Palliative care by specialist   End of life care   DNR (do not resuscitate)   Goals of care, counseling/discussion   Allergies as of 08/31/2019      Reactions   Tegaderm Ag Mesh [silver] Other (See Comments)   blisters      Medication List    STOP taking these medications   DIUREX PO   naproxen sodium 220 MG tablet Commonly known as: ALEVE            Discharge Care Instructions  (From admission, onward)         Start     Ordered   08/31/19 0000  Change dressing (specify)       Comments: Routine, daily cleansing and dressing of wound using split guaze and normal saline.   08/31/19 1436          LABORATORY STUDIES CBC    Component Value Date/Time   WBC 8.9 08/26/2019 0405   RBC 3.81 (L) 08/26/2019 0405   HGB 11.7 (L) 08/26/2019 0405   HCT 36.9 (L) 08/26/2019 0405   PLT 232 08/26/2019 0405   MCV 96.9 08/26/2019 0405   MCH 30.7 08/26/2019 0405   MCHC 31.7 08/26/2019 0405   RDW 15.9 (H) 08/26/2019 0405   LYMPHSABS 1.7 08/26/2019 0405   MONOABS 0.8 08/26/2019  0405   EOSABS 0.1 08/26/2019 0405   BASOSABS 0.0 08/26/2019 0405   CMP    Component Value Date/Time   NA 138 08/26/2019 0405   K 4.0 08/26/2019 0405   CL 105 08/26/2019 0405   CO2 25 08/26/2019 0405   GLUCOSE 103 (H) 08/26/2019 0405   BUN 14 08/26/2019 0405   CREATININE 0.72 08/26/2019 0405   CALCIUM 8.9 08/26/2019 0405   PROT 5.9 (L) 08/24/2019 0440   ALBUMIN 2.2 (L) 08/24/2019 0440   AST 24 08/24/2019 0440   ALT 41 08/24/2019 0440   ALKPHOS 54 08/24/2019 0440   BILITOT 0.7 08/24/2019 0440   GFRNONAA >60 08/26/2019 0405   GFRAA >60 08/26/2019 0405   COAGS Lab Results  Component Value Date   INR 1.3 (H) 08/22/2019   INR 1.4 (H) 07/01/2019   INR 1.0 07/01/2019   Lipid Panel    Component Value Date/Time   CHOL 92 07/02/2019 0431   TRIG 137 07/03/2019 0500   HDL 30 (L) 07/02/2019 0431   CHOLHDL 3.1 07/02/2019 0431   VLDL 17 07/02/2019 0431   LDLCALC 45 07/02/2019 0431   HgbA1C  Lab Results  Component Value Date   HGBA1C 5.5 07/02/2019   Urinalysis  Component Value Date/Time   COLORURINE YELLOW 08/22/2019 1315   APPEARANCEUR CLOUDY (A) 08/22/2019 1315   LABSPEC 1.024 08/22/2019 1315   PHURINE 7.0 08/22/2019 1315   GLUCOSEU NEGATIVE 08/22/2019 1315   HGBUR NEGATIVE 08/22/2019 1315   BILIRUBINUR NEGATIVE 08/22/2019 1315   KETONESUR NEGATIVE 08/22/2019 1315   PROTEINUR NEGATIVE 08/22/2019 1315   NITRITE NEGATIVE 08/22/2019 1315   LEUKOCYTESUR NEGATIVE 08/22/2019 1315   Urine Drug Screen     Component Value Date/Time   LABOPIA NONE DETECTED 07/01/2019 1930   COCAINSCRNUR NONE DETECTED 07/01/2019 1930   LABBENZ POSITIVE (A) 07/01/2019 1930   AMPHETMU NONE DETECTED 07/01/2019 1930   THCU NONE DETECTED 07/01/2019 1930   LABBARB NONE DETECTED 07/01/2019 1930    Alcohol Level    Component Value Date/Time   ETH <10 07/01/2019 0759   SIGNIFICANT DIAGNOSTIC STUDIES CT HEAD WO CONTRAST  Result Date: 08/24/2019 CLINICAL DATA:  Stroke follow-up EXAM: CT  HEAD WITHOUT CONTRAST TECHNIQUE: Contiguous axial images were obtained from the base of the skull through the vertex without intravenous contrast. COMPARISON:  None. FINDINGS: Brain: Redemonstration of left anterior and middle cerebral artery territory infarction with large area of cystic encephalomalacia and herniation of intracranial contents through a large left craniectomy defect. The amount of extracranial herniation as increased and now wraps around the anterior and superior margin of the craniectomy site. Rightward midline shift of approximately 5 mm is unchanged. No acute hemorrhage. Vascular: Left middle cerebral artery remains dense. Skull: Large left craniectomy defect Sinuses/Orbits: No acute finding. Other: None. IMPRESSION: 1. Redemonstration of left anterior and middle cerebral artery territory infarction with large area of cystic encephalomalacia and herniation of intracranial contents through a large left craniectomy defect. 2. Unchanged rightward midline shift of approximately 5 mm. 3. No acute hemorrhage. Electronically Signed   By: Deatra Robinson M.D.   On: 08/24/2019 20:28   CT HEAD WO CONTRAST  Result Date: 08/14/2019 CLINICAL DATA:  49 year old male with large left hemisphere infarct, left hemi craniectomy. Cranioplasty preoperative planning. EXAM: CT HEAD WITHOUT CONTRAST TECHNIQUE: Contiguous axial images were obtained from the base of the skull through the vertex without intravenous contrast. COMPARISON:  Head CT 07/23/2019 and earlier. 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 And this herniation tracks to the left vertex under the scalp soft tissues (series 6, image 19). Scattered areas of laminar necrosis versus petechial hemorrhage along the margins of the infarct, including in the left occipital lobe. There is mild  intracranial rightward midline shift of 4-5 mm with mild mass effect on the body of the left lateral ventricle. But there is no ventriculomegaly or transependymal edema suspected. Right hemisphere and posterior fossa gray-white matter differentiation is preserved. No acute intracranial blood products. Vascular: Hyperdense left MCA suspected. Skull: Stable left hemi craniectomy. Sinuses/Orbits: Visualized paranasal sinuses and mastoids are stable and well pneumatized. Other: Postoperative changes to the left scalp with continued substantial herniation of intracranial contents (series 6, image 66 today). Orbits soft tissues remain normal. IMPRESSION: 1. Extensive left hemisphere cystic encephalomalacia with broad-based herniation of intracranial contents through the craniectomy defect, stable since 07/05/2019. Scattered associated laminar necrosis versus petechial hemorrhage. 2. Mild rightward midline shift of 4-5 mm. Basilar cisterns remain patent. 3. No new intracranial abnormality. Electronically Signed   By: Odessa Fleming M.D.   On: 08/14/2019 17:32   MR BRAIN W WO CONTRAST  Result Date: 08/27/2019 CLINICAL DATA:  Stroke, follow-up.  Right hemiplegia. EXAM: MRI HEAD WITHOUT AND WITH CONTRAST TECHNIQUE: Multiplanar, multiecho pulse sequences of the brain and surrounding structures were obtained without and with intravenous contrast. CONTRAST:  55mL GADAVIST GADOBUTROL 1 MMOL/ML IV SOLN COMPARISON:  CT head 08/14/19 and 08/24/2019. MR head without contrast 07/01/2019. FINDINGS: Brain: Large left MCA and ACA territory infarct is again noted. Cranioplasty noted with marked herniation of brain tissue and complex fluid collection extending outside of the confines of the calvarium. Areas of restricted diffusion are present within the encephalomalacia. Peripheral aspects of tissue have mostly liquified. Dense surrounding enhancement is present throughout the infarcted tissue. Dural and subarachnoid enhancement is noted  anteriorly. Focal restricted diffusion is present in the left occipital lobe, adjacent to the previously infarcted tissue. This area is enhancing. Central collection demonstrates subtle increased T2 signal hyperintensity with associated restricted diffusion, likely hemorrhage. Focal restricted diffusion is also present along the posterior right ventricle tendon Mild. Right hemisphere is otherwise unremarkable. Residual midline shift measures 5 mm maximally. Signal changes in the left cerebral peduncle and pons are consistent with Wallerian degeneration. Vascular: The left ICA is occluded. Skull and upper cervical spine: Craniocervical junction is normal. Upper cervical spine is unremarkable. Sinuses/Orbits: Mucosal thickening is present in the left sphenoid sinus. Paranasal sinuses are otherwise clear. Globes and orbits are within normal limits. IMPRESSION: 1. Large left MCA and ACA territory infarct with marked herniation of brain tissue and complex fluid collection extending outside of the confines of the calvarium. 2. Areas of restricted diffusion and enhancement involving the encephalomalacia. Findings are concerning for infection superimposed on the infarcted tissue. 3. Restricted diffusion in the left occipital lobe adjacent to the infarcted tissue may represent progressive infarct or focal adjacent infection. 4. Focal restricted diffusion adjacent to the right lateral ventricle is more likely ischemic. 5. Mass effect with midline shift of 5 mm. 6. Occlusion of the left ICA. 7. Wallerian degeneration of the left cerebral peduncle and pons. 8. Left sphenoid sinus disease. Electronically Signed   By: San Morelle M.D.   On: 08/27/2019 06:46   DG CHEST PORT 1 VIEW  Result Date: 08/25/2019 CLINICAL DATA:  Hypoxemia EXAM: PORTABLE CHEST 1 VIEW COMPARISON:  08/22/2019 FINDINGS: Tracheostomy tube in satisfactory position. Trace right pleural effusion. Mild right basilar atelectasis. No pneumothorax.  Stable cardiomediastinal silhouette. No aggressive osseous lesion. IMPRESSION: Trace right pleural effusion. Mild right basilar atelectasis. Electronically Signed   By: Kathreen Devoid   On: 08/25/2019 12:16   DG Chest Port 1 View  Result Date: 08/22/2019 CLINICAL DATA:  Fever. EXAM: PORTABLE CHEST 1 VIEW COMPARISON:  08/04/2019 FINDINGS: Patient slightly rotated to the right. Tracheostomy tube in adequate position. Lungs are hypoinflated with minimal bibasilar opacification which may be due to atelectasis or infection. No effusion. Cardiomediastinal silhouette and remainder of the exam is unchanged. IMPRESSION: Minimal bibasilar opacification which may be due to atelectasis or infection. Electronically Signed   By: Marin Olp M.D.   On: 08/22/2019 13:25   DG CHEST PORT 1 VIEW  Result Date: 08/04/2019 CLINICAL DATA:  Fever EXAM: PORTABLE CHEST 1 VIEW COMPARISON:  07/27/2019 FINDINGS: Tracheostomy is unchanged. Airspace opacity at the right lung base could reflect atelectasis or pneumonia and is similar prior study. Mild elevation of the right hemidiaphragm. No confluent opacity on the left. No effusions or acute bony abnormality. IMPRESSION: Stable right basilar airspace opacity which could reflect atelectasis or pneumonia. Electronically Signed   By: Lennette Bihari  Dover M.D.   On: 08/04/2019 17:24   DG Swallowing Func-Speech Pathology  Result Date: 08/20/2019 Objective Swallowing Evaluation: Type of Study: MBS-Modified Barium Swallow Study  Patient Details Name: NAYIB REMER MRN: [id number] Date of Birth: Jul 27, 1970 Today's Date: 08/20/2019 Time: SLP Start Time (ACUTE ONLY): 1030 -SLP Stop Time (ACUTE ONLY): 1042 SLP Time Calculation (min) (ACUTE ONLY): 12 min Past Medical History: No past medical history on file. Past Surgical History: Past Surgical History: Procedure Laterality Date . CRANIOTOMY Left 07/01/2019  Procedure: Left Hemicraniectomy;  Surgeon: Coletta Memos, MD;  Location: [facility] OR;  Service:  Neurosurgery;  Laterality: Left; . ESOPHAGOGASTRODUODENOSCOPY N/A 07/17/2019  Procedure: ESOPHAGOGASTRODUODENOSCOPY (EGD);  Surgeon: Diamantina Monks, MD;  Location: [facility] ENDOSCOPY;  Service: General;  Laterality: N/A; . IR CT HEAD LTD  07/01/2019 . IR IVC FILTER PLMT / S&I /IMG GUID/MOD SED  07/12/2019 . IR PERCUTANEOUS ART THROMBECTOMY/INFUSION INTRACRANIAL INC DIAG ANGIO  07/01/2019 . LESION EXCISION N/A 08/05/2019  Procedure: Scalp Wound Revision;  Surgeon: Coletta Memos, MD;  Location: [facility] OR;  Service: Neurosurgery;  Laterality: N/A;  left,posterior . PEG PLACEMENT N/A 07/17/2019  Procedure: PERCUTANEOUS ENDOSCOPIC GASTROSTOMY (PEG) PLACEMENT;  Surgeon: Diamantina Monks, MD;  Location: [facility] ENDOSCOPY;  Service: General;  Laterality: N/A; . RADIOLOGY WITH ANESTHESIA N/A 07/01/2019  Procedure: IR WITH ANESTHESIA;  Surgeon: Radiologist, Medication, MD;  Location: [facility] OR;  Service: Radiology;  Laterality: N/A; HPI: This 49 y.o. male admitted 4/28 with Rt sided hemiplegia and aphasia.  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  Repeat CT following procedure showed moderate basal ganglia hemorrhage vs. stroke   Repeat MRI revealed that the ICA and MCA re-occluded with change in FLAIR sequence excluding him from repeat thrombectomy.  He underwent a decompressive hemicraniectomy.  He waws intubated 4/28.  He developed an ileus 5/2. Trach 5/8. IVC filter placed 5/9.  Peg placement.  PMH includes: COPD, tobacco abuse, psoriasis  Subjective: Pt was alert Assessment / Plan / Recommendation CHL IP CLINICAL IMPRESSIONS 08/20/2019 Clinical Impression Pt demonstrates a mild oral dysphagia with left buccal weakness and delayed oral transit due to a swishing rolling behavior prior to bolus transit. Pt has mild residue on the floor of the mouth that he senses and gathers for a second swallow. Pt does hove slight premature spillage/delayed laryngeal closure with thin liquids resulting in trace frank penetration/aspiration. Nectar  and puree are toelrated without incident, but pt had a hard cough after 90% of swallows despite no penetration, aspiration or residue. Esophageal sweep revealed trace distal residue. Cough may be attributed to PMSV which was in place and may have been causing some discomfort. Pts breathing pattern relaxed and cough response stopped when PMSV was removed. Will need further f/u with PMSV to determine if it is irritating pt. He has been wearing it all waking hours with good vitals. Recommend pt initiate a puree diet and nectar thick liquids. Will f/u for tolerance. SLP Visit Diagnosis Dysphagia, oropharyngeal phase (R13.12) Attention and concentration deficit following -- Frontal lobe and executive function deficit following -- Impact on safety and function Mild aspiration risk   CHL IP TREATMENT RECOMMENDATION 08/20/2019 Treatment Recommendations Therapy as outlined in treatment plan below   Prognosis 08/20/2019 Prognosis for Safe Diet Advancement Good Barriers to Reach Goals Cognitive deficits Barriers/Prognosis Comment -- CHL IP DIET RECOMMENDATION 08/20/2019 SLP Diet Recommendations Dysphagia 1 (Puree) solids;Nectar thick liquid Liquid Administration via Straw Medication Administration Via alternative means Compensations Slow rate;Small sips/bites Postural Changes Remain semi-upright  after after feeds/meals (Comment);Seated upright at 90 degrees   CHL IP OTHER RECOMMENDATIONS 08/20/2019 Recommended Consults -- Oral Care Recommendations Oral care BID Other Recommendations Have oral suction available;Order thickener from pharmacy   CHL IP FOLLOW UP RECOMMENDATIONS 08/20/2019 Follow up Recommendations Skilled Nursing facility   [facility] IP FREQUENCY AND DURATION 08/20/2019 Speech Therapy Frequency (ACUTE ONLY) min 2x/week Treatment Duration 2 weeks      CHL IP ORAL PHASE 08/20/2019 Oral Phase Impaired Oral - Pudding Teaspoon -- Oral - Pudding Cup -- Oral - Honey Teaspoon -- Oral - Honey Cup -- Oral - Nectar Teaspoon -- Oral -  Nectar Cup -- Oral - Nectar Straw Lingual pumping;Right pocketing in lateral sulci;Delayed oral transit;Left anterior bolus loss Oral - Thin Teaspoon -- Oral - Thin Cup -- Oral - Thin Straw Decreased bolus cohesion;Premature spillage;Right pocketing in lateral sulci Oral - Puree Delayed oral transit;Decreased bolus cohesion;Right pocketing in lateral sulci Oral - Mech Soft -- Oral - Regular -- Oral - Multi-Consistency -- Oral - Pill -- Oral Phase - Comment --  CHL IP PHARYNGEAL PHASE 08/20/2019 Pharyngeal Phase Impaired Pharyngeal- Pudding Teaspoon -- Pharyngeal -- Pharyngeal- Pudding Cup -- Pharyngeal -- Pharyngeal- Honey Teaspoon -- Pharyngeal -- Pharyngeal- Honey Cup -- Pharyngeal -- Pharyngeal- Nectar Teaspoon -- Pharyngeal -- Pharyngeal- Nectar Cup -- Pharyngeal -- Pharyngeal- Nectar Straw WFL Pharyngeal -- Pharyngeal- Thin Teaspoon -- Pharyngeal -- Pharyngeal- Thin Cup -- Pharyngeal -- Pharyngeal- Thin Straw Penetration/Aspiration before swallow Pharyngeal Material enters airway, CONTACTS cords and not ejected out;Material enters airway, CONTACTS cords and then ejected out;Material does not enter airway Pharyngeal- Puree WFL Pharyngeal -- Pharyngeal- Mechanical Soft -- Pharyngeal -- Pharyngeal- Regular -- Pharyngeal -- Pharyngeal- Multi-consistency -- Pharyngeal -- Pharyngeal- Pill -- Pharyngeal -- Pharyngeal Comment --  CHL IP CERVICAL ESOPHAGEAL PHASE 08/20/2019 Cervical Esophageal Phase WFL Pudding Teaspoon -- Pudding Cup -- Honey Teaspoon -- Honey Cup -- Nectar Teaspoon -- Nectar Cup -- Nectar Straw -- Thin Teaspoon -- Thin Cup -- Thin Straw -- Puree -- Mechanical Soft -- Regular -- Multi-consistency -- Pill -- Cervical Esophageal Comment -- DeBlois, Riley Nearing 08/20/2019, 12:25 PM                 HISTORY OF PRESENT ILLNESS CARMELLO CABINESS is a 49 y.o. male with history of COPD, tobacco abuse, psoriasis. Patient was last seen normal at 11:00 on 07/01/19 by his daughter. Later on when she went to  check on him she found him on the toilet with right-sided hemiplegia and expressive aphasia. Patient was brought to the hospital without code stroke being called. Upon entering the ED code stroke was alerted and stroke work up was initiated. Code Stroke CTH revealed hyperdense left ICA and MCA compatible with acute thrombus.  No acute infarct hemorrhage. CTA head and neck revealed occlusion of the cervical internal carotid artery on the left. CTP was done that showed -CBF less than 30% with volume of 266 mL, perfusion volume 313 mL, mismatch volume 47 mL. Patient was not a candidate for IVTPA as he was outside the time window. He was within the time window for mechanical thrombectomy and was taken to the IR suite.   MRS: 0   HOSPITAL COURSE Mr. MERIT MAYBEE is a 49 y.o. male with history of COPD, ongoing tobacco use, and psoriasis, presenting with dense LMCA syndrome. Attempt for Left ICA and Left M1 thrombectomy was done emergently with TICI 2b flow. Unfortunately, the pt had post procedure hemorrhage and reocclusion of the vessels.  F/u imaging suggested early edema and pt underwent prophylactic decompressive hemicraniectomy.  Patient was enrolled in the CHARM trial for cytotoxic edema but was a screen failure due to increase core size greater than 300 mL on DWI.  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 Stroke etiology unknown - given DVT requiring eventual AC and high mRS, will not do further embolic workup  Code Stroke CT head showed early hypodensity in nearly entire LMCA. ASPECTS 6-7 per notes.  CTA head & neck occlusion cervical ICA on the left. This is most likely due to dissection. The patient has minimal atherosclerotic disease.  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  There is poor collateral  circulation on the left.   CTP CBF large core volume of 266 mL, perfusion volume 313 mL, mismatch volume 47 mL.  ASPECTS using CTP by Dr. Chestine Sporelark 6 or 7.   IR - TICI2c revascularization  MRI  Restricted diffusion throughout the majority of the left MCA and ACA vascular territories consistent with acute ischemia.  MRA - reocclusion of the intracranial left internal carotid artery. No flow related signal is seen within the left middle cerebral artery.  CT Head 07/04/19 - Slightly increased rightward midline shift with a small amount of herniation beneath the anterior falx. Otherwise unchanged examination.  CT repeat 5/6 stable infarcts and SAH but increasing edema w/ midline shift now 5mm and herniation through crani site  CT repeat 5/21 decreased shift 5->693mm. Extensive L crani w/ buldging through defect. Still L ACA and MCA low density w/ petechial hemorrhage but no large hemorrhage.   2D Echo - EF 55 - 60%. No cardiac source of emboli identified.   LDL 45  HgbA1c 5.5  No antithrombotic prior to admission, was on Eliquis however was discontinued due to comfort measures  Therapy recommendations:  SNF   Left depressive hemicraniectomy on 07/01/19 Dr Franky Machoabbell  Family has approved for patient to transition to comfort care   Disposition: Hospice Medical Facility  Respiratory failure s/p Effie Berkshirerach Trach wound, resolved   Intubated on vent  CCM on board  CXR 07/03/19- New consolidation in the right lower lobe and left infrahilar region concerning for pneumonia. Possible small layering right effusion.  CXR 5/3 mild pulm edema w/ atx vs infiltrate/aspiration  On cefepime 4/30>>5/2 for pneumonia  Augmentin 5/2>>5/4  Unasyn 5/5>>5/6  Trach placed 5/8 per CCM  WOC consulted for trach wound ->progressed to stage 3 pressure, for dressing change q3d and prn  Fever and elevated white count and tachycardia hence restarted on Vanc & Cefepime 5/11>>5/13  Ancef 5/14>>5/17  (pneumonia)  On  trach collar since 5/14 w/ high FiO2 requirements  Downsized to cuffless trach 6 by CCM 5/24  On trach collar  Cerebral edema, resolved Hypernatremia, resolved  MRI - Restricted diffusion throughout the majority of the left MCA and ACA vascular territories consistent with acute ischemia.  CT Head 07/04/19 - Slightly increased rightward midline shift with a small amount of herniation beneath the anterior falx. Otherwise unchanged examination.  S/p decompressive left hemicraniectomy  4/28 Dr Franky Machoabbell. Bone flap is placed in the abdomen.  CT 5/6 - stable infarcts and SAH but increasing edema w/ midline shift now 5mm and herniation through crani site  CT head 5/20 - decreased shift 5->603mm. Extensive L crani w/ buldging through defect. Still  L ACA and MCA low density w/ petechial hemorrhage but no large hemorrhage.  Completed keppra 2 wk course per NSG  Off 3% and D5NS  Okay to allow Na gradually normalize  Na has normalized   Fever, Tachycardia, Leukocytosis  Possible intracranial infection ->sepsis  TMax 100.2->100.3->afebrile  Leukocytosis, resolved   UA neg  Sputum moraxella, H. flu   Respiratory cultures - 6/19 Moderate haemophilus influenzae beta lactamase positive, few staphylococcus aureus   Blood culture 5/2, 5/7,5/11 with no growth  CXR - 6/19 - Minimal bibasilar opacification which may be due to atelectasis or infection.  He was treated with a variety of antibiotics during hospital stay including Cefepime, Augmentin, Vanc, Ancef and Unasyn. He was last on Maxipime, Metronidazole and Vancomycin starting 08/22/19 for sepsis. He is currently OFF all antibiotics for comfort care measures   Bilateral Lower Ext DVT, extensive Possible PE given tachycardia  Venous US 07/11/2019 DVT + bilateral extensive  IVC filter placed 5/9  Was on Eliquis for DVT treatment and prophylaxis, now off for comfort care   Hyperglycemia  on TF  Added sliding scale insulin  Dysphagia    PEG placement 5/14  Was on bolus TF   MBSS cleared for D1 nectar thick liquids  SLP was following  Now on comfort care  Ileus, resolved  Abd distension w/ decreased BS  KUB 5/2 distended colonic loop  Miralax   Flexiseal following laxatives  On reglan, laxatives on hold, decrease fentanyl  Coretrak 5/7  PEG 5/14  TF resumed after PEG  CSF leakage, resolved  Crani sutures out -> suture incision leaking 5/16 - s/p local suture  Again From left scalp incision site 6/1  NSG on board (Cabbell)  S/p wound repair with Dr. Franky Macho 6/2  Acute Renal Failure, resolved  Renal US - increased cortical echogenicity in B kidneys c/w renal dz. Increased echogenicity of liver ?hepatic steatosis.  Urine studies - elevated protein creatinine ratio, Na normal, UN normal  Creatinine - 0.68   Diuresis - per CCM in ICU  Dysphagia  Protein calore malnutrition   Secondary to stroke  PEG placement 5/14  FW 150 Q4h   On bolus TF  MBSS cleared for D1 nectar thick liquids  Comfort care feeding permitted   Tobacco abuse  Smoker prior to hospitalization   Smoking cessation counseling unable to be provided due to mental status   Pressure Injury   Stage 3 pressure ulcer located to the right side under the tracheostomy, not present on admission   Other Stroke Risk Factors  Obesity, Body mass index is 28.29 kg/m., recommend weight loss, diet and exercise as appropriate    Likely undiagnosed obstructive sleep apnea based on body habitus  DISCHARGE EXAM Blood pressure 107/75, pulse 83, temperature 98.3 F (36.8 C), temperature source Axillary, resp. rate 16, height 6' (1.829 m), weight 99.8 kg, SpO2 96 %.  Neurological Exam  S/P tracheostomy on trach collar.  Eyes open but global aphasia, left gaze preference position, barely cross midline, notblinking to visual threat on the left, PERRL. Right facial droop. Tongue protrusion not cooperative. LUE spontaneous  movement at least 4/5, LLE against gravity at least 3/5.  No spontaneous movement of right upper and lower extremity, does not seem to be developing rigidity or spasticity at this time. Sensation, coordination and gait not tested.   DISCHARGE PLAN  Disposition: [facility]   Continue comfort care measures  More than 30 minutes were spent preparing this discharge.   Stark Jock,  NP  [facility]  Patient seen and discussed with attending physician Dr. Roda Shutters

## 2019-08-31 NOTE — Progress Notes (Signed)
PROGRESS NOTE  Lee Hooper  DOB: Dec 01, 1970  PCP: Patient, No Pcp Per ZOX:[id number]  DOA: 07/01/2019  LOS: 52 days   Chief Complaint  Patient presents with  . Code Stroke   Brief narrative: Lee Hooper is a 49 y.o. male , chronic daily smoker with COPD and psoriasis who was admitted on 4/28 primarily under neurology service.   Medicine consultation was: 6/19 for comanagement of medical issues.    08/30/2019: Patient seen.  No history from patient.  No new changes. 08/31/2019: Patient seen.  No new changes.  Discussed with neurology team.  Plan is to proceed inpatient hospice.  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  Underwent arteriogram with revascularization/thrombectomy but had rethrombosis and hence underwent L depressive hemicraniectomy.  He remained intubated in ICU postprocedure. Could not be extubated. 5/8 -trach placement.  Also found to have bilateral lower extremity DVT. 5/9 -IVC filter placement.  5/23 -Eliquis resumed for DVT 6/4 -required scalp wound revision on 6/4 and is planned for cranioplasty next week.  6/6 -transfered out of ICU.  6/17 -started on dysphagia diet. 6/19 -started having fever up to 103.2, tachycardia. Chest x-ray with possible aspiration.  Sepsis work-up started and hospitalist service was consulted. 6/23 -MRI brain obtained on 6/23 showed findings concerning for infection superimposed on infarcted tissue.  Subjective: Patient is not able to give any history.  Assessment/Plan: Sepsis likely secondary to intracranial infection -Unfortunate young man with catastrophic CVA in April, still recovering with dense R hemiparesis -6/19 -started having fever, tachycardia. -Initial suspicion was for aspiration pneumonia.  He was placed on broad-spectrum antibiotics despite which he continued to spike fever. -MRI brain obtained on 6/23 showed findings concerning for infection superimposed on infarcted  tissue. -Palliative care consultation highly appreciated. Comfort care measures started. 08/29/2019: No new changes.  Patient is comfortable.  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  Underwent arteriogram with revascularization/thrombectomy but had rethrombosis and hence underwent L depressive hemicraniectomy.  -Trach/PEG in place  DVT -5/8 -extensive B LE DVT -5/9 -IVC filter placed  -5/23 -Eliquis initiated  Psoriasis -No apparent treatment as outpatient  Hyperglycemia - A1c 5.5 on 4/29 -Continue sliding scale with Accu-Cheks  Pressure injury -Stage 1 sacral ulcer, appears unrelated to current sepsis Pressure Injury 07/15/19 Other (Comment) Right Stage 3 -  Full thickness tissue loss. Subcutaneous fat may be visible but bone, tendon or muscle are NOT exposed. reddened, open indention under right side of tracheostomy (Active)  07/15/19 0330  Location: Other (Comment) (under tracheostomy)  Location Orientation: Right  Staging: Stage 3 -  Full thickness tissue loss. Subcutaneous fat may be visible but bone, tendon or muscle are NOT exposed.  Wound Description (Comments): reddened, open indention under right side of tracheostomy  Present on Admission: No    Nutrition Problem: Inadequate oral intake Etiology: inability to eat  Signs/Symptoms: NPO status  Infusions:    Scheduled Meds: . chlorhexidine gluconate (MEDLINE KIT)  15 mL Mouth Rinse BID  . mouth rinse  15 mL Mouth Rinse q12n4p    Antimicrobials: Anti-infectives (From admission, onward)   Start     Dose/Rate Route Frequency Ordered Stop   08/27/19 0000  vancomycin (VANCOREADY) IVPB 1250 mg/250 mL  Status:  Discontinued        1,250 mg 166.7 mL/hr over 90 Minutes Intravenous Every 8 hours 08/26/19 1701 08/28/19 1358   08/24/19 1610  vancomycin (VANCOREADY) IVPB 1500 mg/300 mL  Status:  Discontinued        1,500 mg 150 mL/hr over 120 Minutes Intravenous Every 12 hours 08/24/19 1610 08/26/19  1701   08/23/19 0400  vancomycin (VANCOREADY) IVPB 1250 mg/250 mL  Status:  Discontinued        1,250 mg 166.7 mL/hr over 90 Minutes Intravenous Every 12 hours 08/22/19 1526 08/24/19 1610   08/22/19 1530  metroNIDAZOLE (FLAGYL) IVPB 500 mg  Status:  Discontinued        500 mg 100 mL/hr over 60 Minutes Intravenous Every 8 hours 08/22/19 1511 08/28/19 0918   08/22/19 1530  ceFEPIme (MAXIPIME) 2 g in sodium chloride 0.9 % 100 mL IVPB  Status:  Discontinued        2 g 200 mL/hr over 30 Minutes Intravenous Every 8 hours 08/22/19 1525 08/28/19 1358   08/22/19 1530  vancomycin (VANCOREADY) IVPB 1750 mg/350 mL        1,750 mg 175 mL/hr over 120 Minutes Intravenous  Once 08/22/19 1526 08/22/19 1814   08/22/19 1515  ceFEPIme (MAXIPIME) 2 g in sodium chloride 0.9 % 100 mL IVPB  Status:  Discontinued        2 g 200 mL/hr over 30 Minutes Intravenous  Once 08/22/19 1511 08/22/19 1525   08/22/19 1515  vancomycin (VANCOCIN) IVPB 1000 mg/200 mL premix  Status:  Discontinued        1,000 mg 200 mL/hr over 60 Minutes Intravenous  Once 08/22/19 1511 08/22/19 1525   07/28/19 0800  vancomycin (VANCOCIN) IVPB 1000 mg/200 mL premix  Status:  Discontinued        1,000 mg 200 mL/hr over 60 Minutes Intravenous Every 8 hours 07/27/19 2300 07/30/19 1522   07/27/19 2300  ceFEPIme (MAXIPIME) 2 g in sodium chloride 0.9 % 100 mL IVPB  Status:  Discontinued        2 g 200 mL/hr over 30 Minutes Intravenous Every 8 hours 07/27/19 2251 08/02/19 0927   07/27/19 2300  vancomycin (VANCOREADY) IVPB 2000 mg/400 mL        2,000 mg 200 mL/hr over 120 Minutes Intravenous  Once 07/27/19 2258 07/28/19 0130   07/27/19 2245  ceFEPIme (MAXIPIME) 2 g in sodium chloride 0.9 % 100 mL IVPB  Status:  Discontinued        2 g 200 mL/hr over 30 Minutes Intravenous Every 12 hours 07/27/19 2244 07/27/19 2251   07/17/19 1400  ceFAZolin (ANCEF) IVPB 2g/100 mL premix        2 g 200 mL/hr over 30 Minutes Intravenous Every 8 hours 07/17/19 0922  07/20/19 2213   07/16/19 1400  ceFEPIme (MAXIPIME) 2 g in sodium chloride 0.9 % 100 mL IVPB  Status:  Discontinued        2 g 200 mL/hr over 30 Minutes Intravenous Every 8 hours 07/16/19 1132 07/17/19 0922   07/16/19 1200  vancomycin (VANCOREADY) IVPB 1250 mg/250 mL  Status:  Discontinued        1,250 mg 166.7 mL/hr over 90 Minutes Intravenous Every 24 hours 07/15/19 1333 07/17/19 0923   07/15/19 2200  ceFEPIme (MAXIPIME) 2 g in sodium chloride 0.9 % 100 mL IVPB  Status:  Discontinued        2 g 200 mL/hr over 30 Minutes Intravenous Every 12 hours 07/15/19 1253 07/16/19 1132   07/15/19 1200  vancomycin (VANCOREADY) IVPB 1500 mg/300 mL  Status:  Discontinued        1,500 mg 150 mL/hr over 120 Minutes Intravenous Every 24 hours 07/14/19 1037 07/15/19  1334   07/14/19 1400  ceFEPIme (MAXIPIME) 2 g in sodium chloride 0.9 % 100 mL IVPB  Status:  Discontinued        2 g 200 mL/hr over 30 Minutes Intravenous Every 8 hours 07/14/19 1015 07/15/19 1253   07/14/19 1045  vancomycin (VANCOREADY) IVPB 1750 mg/350 mL        1,750 mg 175 mL/hr over 120 Minutes Intravenous  Once 07/14/19 1037 07/14/19 1455   07/08/19 1100  Ampicillin-Sulbactam (UNASYN) 3 g in sodium chloride 0.9 % 100 mL IVPB        3 g 200 mL/hr over 30 Minutes Intravenous Every 6 hours 07/08/19 1057 07/09/19 2319   07/07/19 2200  amoxicillin-clavulanate (AUGMENTIN) 875-125 MG per tablet 1 tablet  Status:  Discontinued        1 tablet Per Tube Every 12 hours 07/07/19 1546 07/08/19 1057   07/05/19 1500  amoxicillin-clavulanate (AUGMENTIN) 875-125 MG per tablet 1 tablet  Status:  Discontinued        1 tablet Per Tube Every 12 hours 07/05/19 1214 07/07/19 1546   07/03/19 1000  ceFEPIme (MAXIPIME) 2 g in sodium chloride 0.9 % 100 mL IVPB  Status:  Discontinued        2 g 200 mL/hr over 30 Minutes Intravenous Every 8 hours 07/03/19 0926 07/05/19 1214   07/01/19 0923  ceFAZolin (ANCEF) 2-4 GM/100ML-% IVPB       Note to Pharmacy: Domenick Bookbinder   : cabinet override      07/01/19 0923 07/01/19 2129      PRN meds: acetaminophen (TYLENOL) oral liquid 160 mg/5 mL, bisacodyl, fentaNYL (SUBLIMAZE) injection, glycopyrrolate, haloperidol, haloperidol **OR** haloperidol lactate, LORazepam, morphine, morphine injection, ondansetron **OR** ondansetron (ZOFRAN) IV, polyvinyl alcohol, promethazine   Objective: Vitals:   08/31/19 1137 08/31/19 1526  BP:    Pulse: 83 88  Resp: 16 16  Temp:    SpO2: 96% 95%    Intake/Output Summary (Last 24 hours) at 08/31/2019 1725 Last data filed at 08/31/2019 0332 Gross per 24 hour  Intake --  Output 650 ml  Net -650 ml   Filed Weights   08/24/19 0500 08/25/19 0443 08/26/19 0600  Weight: 96.5 kg 96.3 kg 99.8 kg   Weight change:  Body mass index is 29.84 kg/m.   Physical Exam: General exam: Middle-aged Caucasian male.  Continues to look lethargic. Skin: No rashes, lesions or ulcers. HEENT: Left hemicraniectomy status.  Bulging on the left side of the head with herniation of intracranial contents.   Lungs: Clear to auscultation bilaterally.  Fair respiratory effort.  Has trach collar anteriorly in the neck CVS: Tachycardic, no murmur GI/Abd soft, nontender, nondistended, bowel sound present CNS: Alert, awake, followd verbal command to move the left side. Right dense hemiplegia. Psychiatry: Depressed look Extremities: No pedal edema, no calf tenderness  Data Review: I have personally reviewed the laboratory data and studies available.  Recent Labs  Lab 08/25/19 0500 08/26/19 0405  WBC 8.7 8.9  NEUTROABS 6.1 6.3  HGB 11.9* 11.7*  HCT 38.0* 36.9*  MCV 97.4 96.9  PLT 205 232   Recent Labs  Lab 08/25/19 0500 08/26/19 0405  NA 141 138  K 3.4* 4.0  CL 108 105  CO2 28 25  GLUCOSE 93 103*  BUN 14 14  CREATININE 0.72 0.72  CALCIUM 8.8* 8.9   No results for input(s): LATICACIDVEN, PROCALCITON in the last 168 hours.   Signed, Bonnell Public, MD [facility] 08/31/2019

## 2019-08-31 NOTE — Plan of Care (Signed)
  Problem: Nutrition: Goal: Risk of aspiration will decrease Outcome: Progressing   Problem: Clinical Measurements: Goal: Cardiovascular complication will be avoided Outcome: Progressing

## 2019-08-31 NOTE — TOC Transition Note (Signed)
Transition of Care [facility]) - CM/SW Discharge Note   Patient Details  Name: NORVEL WENKER MRN: [id number] Date of Birth: 1970/09/24  Transition of Care [facility]) CM/SW Contact:  Baldemar Lenis, LCSW Phone Number: 08/31/2019, 2:38 PM   Clinical Narrative:   Nurse to call report to [phone].    Final next level of care: [facility]ospice Medical Facility Barriers to Discharge: Barriers Resolved   Patient Goals and CMS Choice        Discharge Placement                Patient to be transferred to facility by: [facility] Name of family member notified: Wife Patient and family notified of of transfer: 08/31/19  Discharge Plan and Services   Discharge Planning Services: CM Consult                                 Social Determinants of Health (SDOH) Interventions     Readmission Risk Interventions No flowsheet data found.

## 2019-08-31 NOTE — Progress Notes (Signed)
Palliative Medicine RN Note: Spoke with Cheri with [facility]. Patient has been approved for transfer later today.  Margret Chance Nazier Neyhart, RN, BSN, [facility] Palliative Medicine Team 08/31/2019 12:08 PM Office [phone]

## 2019-09-01 LAB — CSF CULTURE W GRAM STAIN: Culture: NO GROWTH

## 2019-10-04 DEATH — deceased

## 2020-12-21 IMAGING — CT CT HEAD W/O CM
4 series · 16 of 47 positions shown, 18 images · non-contrast
Comparison: 07/09/2019

CLINICAL DATA: Follow-up left hemispheric stroke.

EXAM:
CT HEAD WITHOUT CONTRAST
TECHNIQUE: Contiguous axial images were obtained from the base of the skull
through the vertex without intravenous contrast.

[Series 3: head without · axial · non-contrast · 0.46mm/px · z∈[-98,+28]mm · 6 of 35 slices shown, 8 images]
[im 5/35  brain]
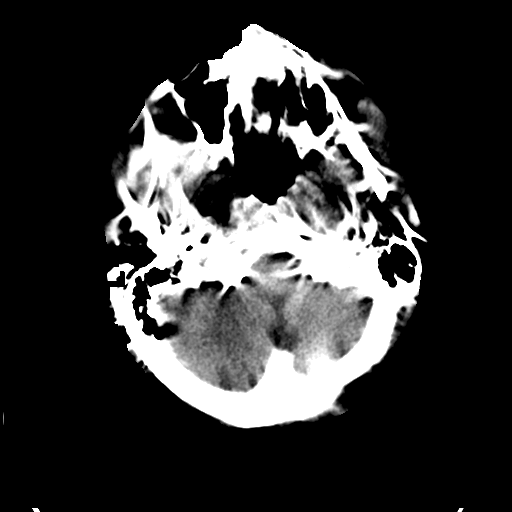
[im 5/35  bone]
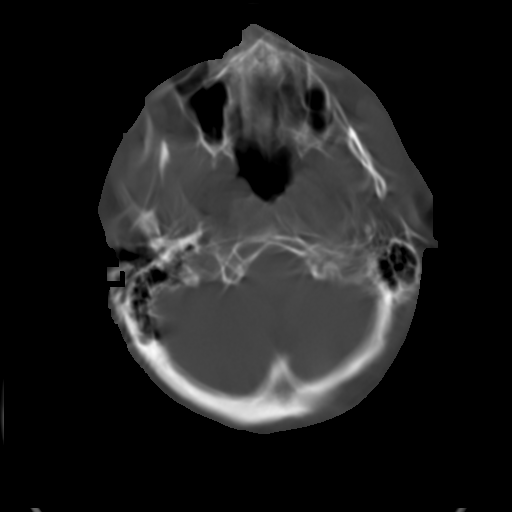
[im 10/35  brain]
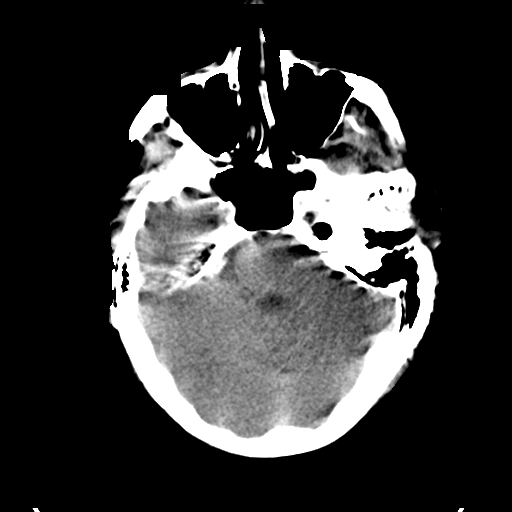
[im 15/35  brain]
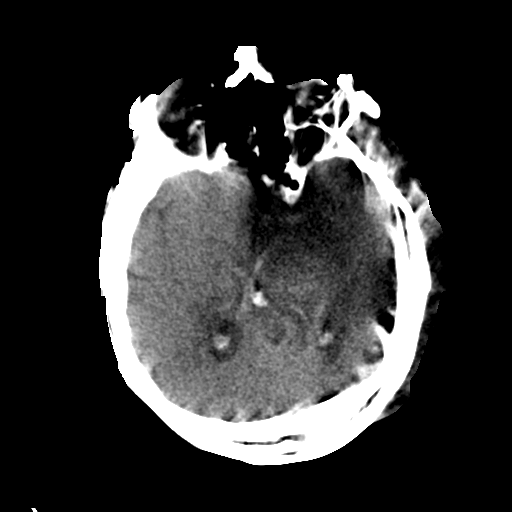
[im 20/35  brain]
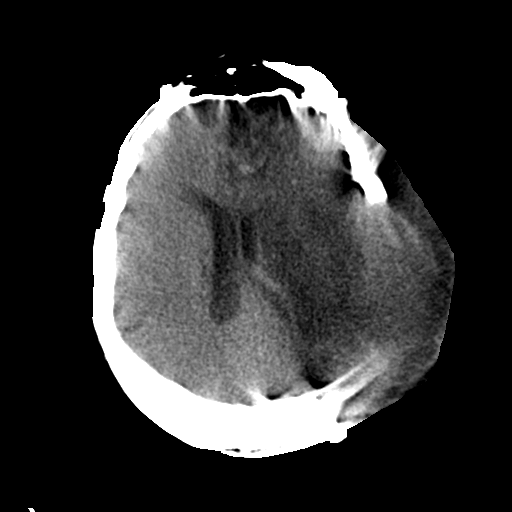
[im 25/35  brain]
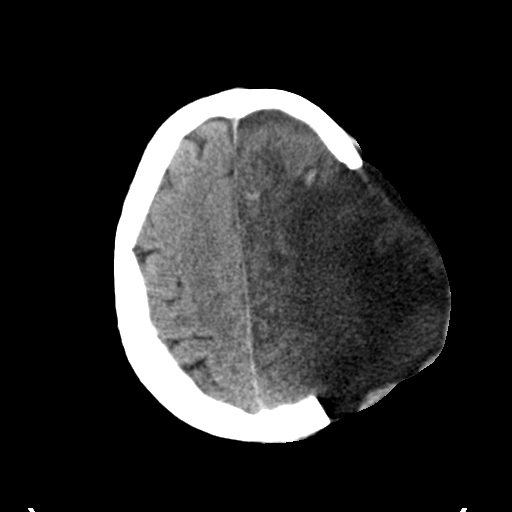
[im 25/35  bone]
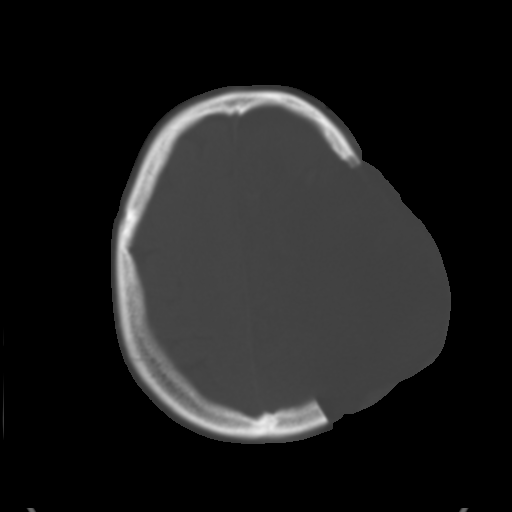
[im 30/35  brain]
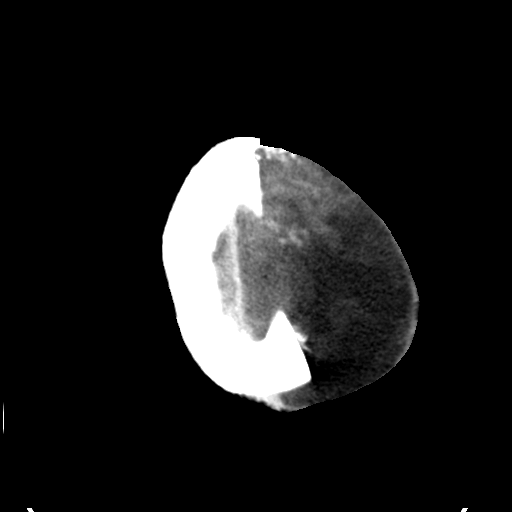

[Series 4: head bone · axial · 0.46mm/px · z∈[-102,-40]mm · 4 of 92 slices shown]
[im 9/92  bone]
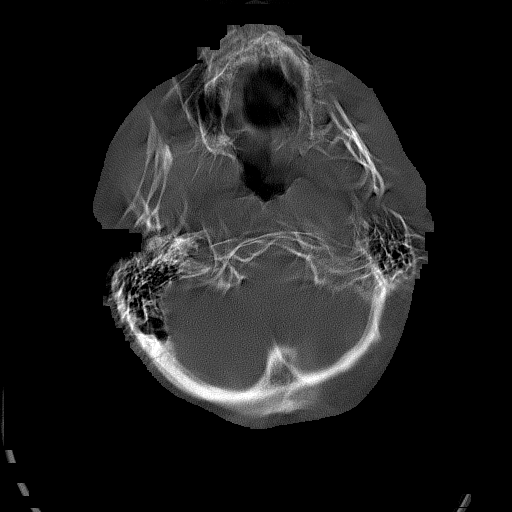
[im 18/92  bone]
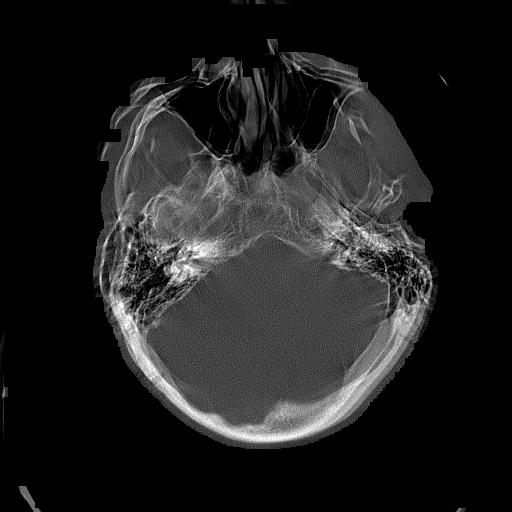
[im 31/92  bone]
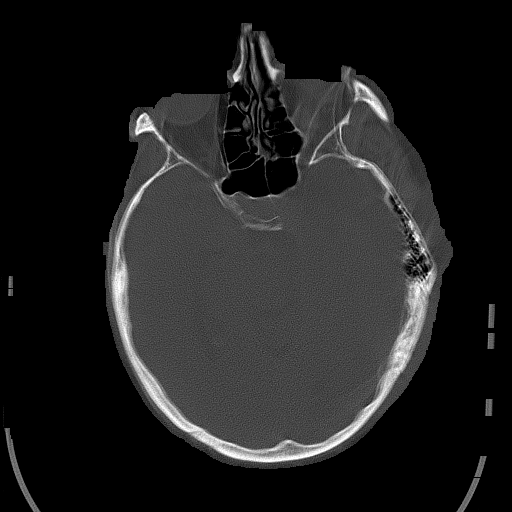
[im 40/92  bone]
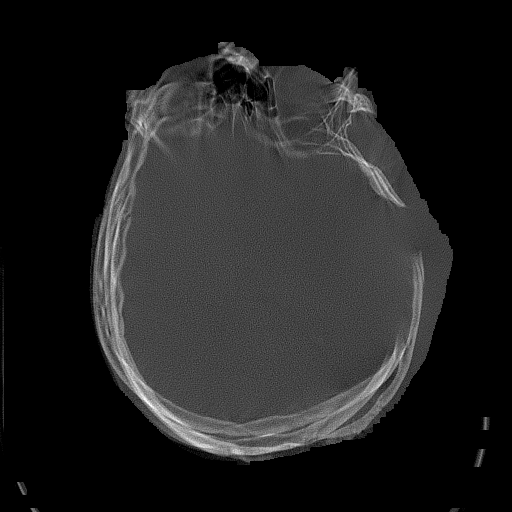

[Series 5: head without cor · coronal · non-contrast · 0.38mm/px · 3 of 78 slices shown]
[im 26/78  brain]
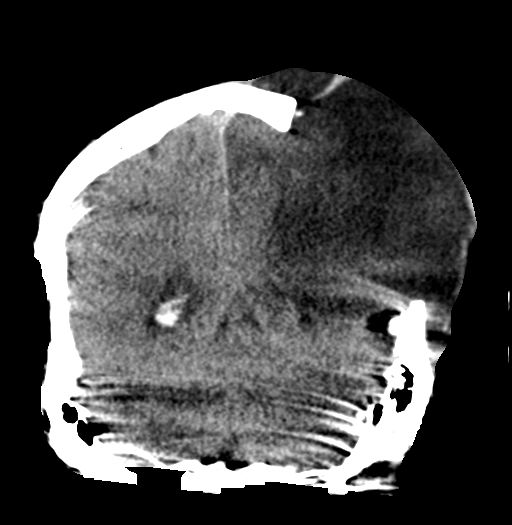
[im 35/78  brain]
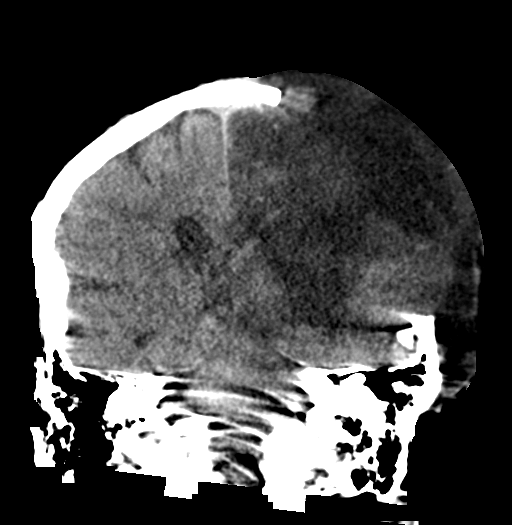
[im 43/78  brain]
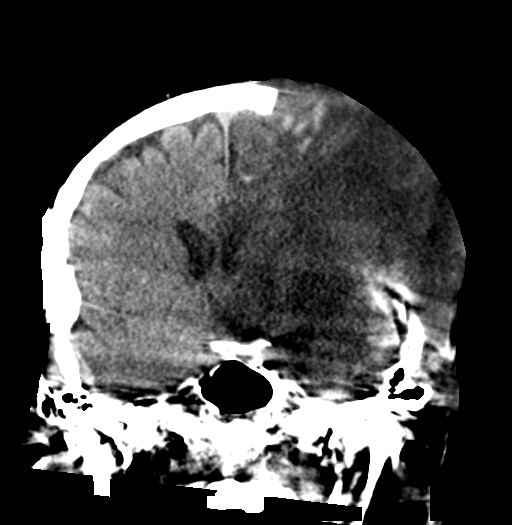

[Series 6: head without sag · sagittal · non-contrast · 0.38mm/px · 3 of 69 slices shown]
[im 23/69  brain]
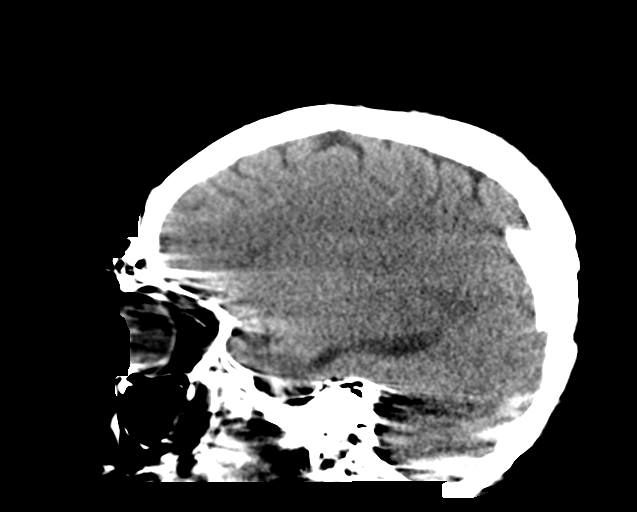
[im 35/69  brain]
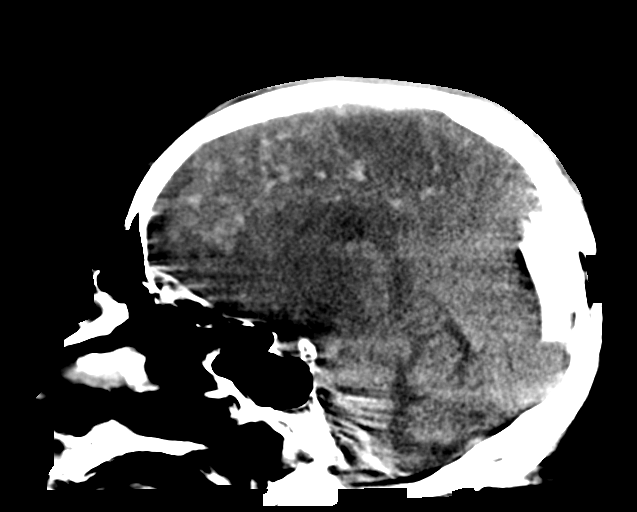
[im 46/69  brain]
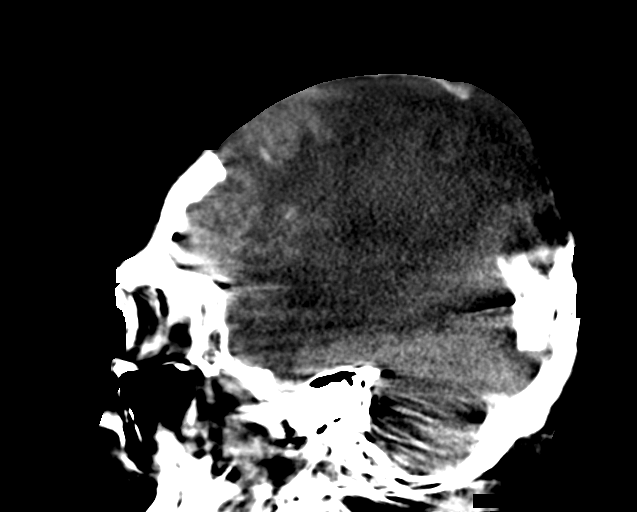

[16 of 47 positions shown; findings below may reference images not displayed]

FINDINGS: Brain: Today's study suffers from considerable motion degradation
but is sufficient for evaluation. No focal abnormality is seen
affecting the brainstem or cerebellum. Since of infarction
throughout the left hemisphere in the anterior and middle cerebral
artery territories with low-density and swelling of the affected
brain and areas of petechial blood products. No large focal
hematoma. Extensive left-sided craniectomy with bulging of brain
tissue through the defect. Because of this, reduction in
left-to-right shift, now measuring only about 3 mm. No hydrocephalus
or ventricular trapping. No extra-axial collection.

Vascular: No significant vascular finding.

Skull: Large left craniectomy as above.

Sinuses/Orbits: Clear sinuses. No orbital abnormality seen.
Extensively motion degraded.

Other: None
IMPRESSION: Continued low-density and swelling of the left hemisphere in the
left ACA and MCA distributions with areas of petechial hemorrhage
but no large hematoma. Extensive left craniectomy with bulging of
the brain through that defect. Reduction in mass effect with
left-to-right shift of 3 mm today compared with 5 mm previously.
